# Patient Record
Sex: Female | Born: 1990
Health system: Southern US, Community
[De-identification: ages and names within clinical notes are randomized; demographics above are authoritative.]

## PROBLEM LIST (undated history)

## (undated) ENCOUNTER — Inpatient Hospital Stay (HOSPITAL_COMMUNITY): Payer: Self-pay

## (undated) DIAGNOSIS — B009 Herpesviral infection, unspecified: Secondary | ICD-10-CM

## (undated) DIAGNOSIS — F329 Major depressive disorder, single episode, unspecified: Secondary | ICD-10-CM

## (undated) DIAGNOSIS — F419 Anxiety disorder, unspecified: Secondary | ICD-10-CM

## (undated) DIAGNOSIS — N739 Female pelvic inflammatory disease, unspecified: Secondary | ICD-10-CM

## (undated) DIAGNOSIS — F32A Depression, unspecified: Secondary | ICD-10-CM

## (undated) DIAGNOSIS — N611 Abscess of the breast and nipple: Secondary | ICD-10-CM

## (undated) DIAGNOSIS — K219 Gastro-esophageal reflux disease without esophagitis: Secondary | ICD-10-CM

## (undated) DIAGNOSIS — J45909 Unspecified asthma, uncomplicated: Secondary | ICD-10-CM

## (undated) DIAGNOSIS — T7840XA Allergy, unspecified, initial encounter: Secondary | ICD-10-CM

## (undated) DIAGNOSIS — F319 Bipolar disorder, unspecified: Secondary | ICD-10-CM

## (undated) DIAGNOSIS — N390 Urinary tract infection, site not specified: Secondary | ICD-10-CM

## (undated) HISTORY — DX: Allergy, unspecified, initial encounter: T78.40XA

## (undated) HISTORY — DX: Abscess of the breast and nipple: N61.1

## (undated) HISTORY — PX: WISDOM TOOTH EXTRACTION: SHX21

## (undated) HISTORY — DX: Unspecified asthma, uncomplicated: J45.909

## (undated) HISTORY — DX: Bipolar disorder, unspecified: F31.9

---

## 2013-08-04 ENCOUNTER — Ambulatory Visit: Payer: Self-pay | Admitting: Family Medicine

## 2013-08-04 VITALS — BP 130/76 | HR 73 | Temp 98.6°F | Resp 16 | Ht 63.5 in | Wt 200.0 lb

## 2013-08-04 DIAGNOSIS — N898 Other specified noninflammatory disorders of vagina: Secondary | ICD-10-CM

## 2013-08-04 LAB — POCT WET PREP WITH KOH
Clue Cells Wet Prep HPF POC: NEGATIVE
Trichomonas, UA: NEGATIVE

## 2013-08-04 MED ORDER — FLUCONAZOLE 150 MG PO TABS: 150.0000 mg | ORAL_TABLET | Freq: Once | ORAL | Status: DC

## 2013-08-04 NOTE — Patient Instructions (Addendum)
I will be in touch with the rest of your labs  Try taking a diflucan pill once a week for 4 weeks.  Also, eating yogurt daily can help.    Let me know if you continue to have symptoms.

## 2013-08-04 NOTE — Progress Notes (Addendum)
[facility] [address], [place] [address] [phone]  Date:  08/04/2013   Name:  Kim Johns   DOB:  1991-04-11   MRN:  [id number]  PCP:  No primary provider on file.    Chief Complaint: Vaginal Discharge   History of Present Illness:  Kim Johns is a 22 y.o. very pleasant female patient who presents with the following:  She is concerned about a persistent vaginal yeast infection.   She was recently treated for yeast and BV- she was treated about 4-6 weeks ago.  She did get better for a time.  She has history of frequent yeast infections.  She noted vaginal itching, thick white discharge.  This has been present off an on for several weeks.   She has not been checked for DM as far as she knows. Her mother does have DM.   LMP was about 2 weeks ago.    She was recently incarcerated and reports she was tested for HIV several times and was negative  There are no active problems to display for this patient.   Past Medical History  Diagnosis Date  . Allergy     History reviewed. No pertinent past surgical history.  History  Substance Use Topics  . Smoking status: Never Smoker   . Smokeless tobacco: Not on file  . Alcohol Use: Not on file    Family History  Problem Relation Age of Onset  . Diabetes Mother   . Hypertension Father     Allergies  Allergen Reactions  . Sulfa Antibiotics     Had since childhood not sure of reaction    Medication list has been reviewed and updated.  No current outpatient prescriptions on file prior to visit.   No current facility-administered medications on file prior to visit.    Review of Systems:  As per HPI- otherwise negative.   Physical Examination: Filed Vitals:   08/04/13 1322  BP: 130/76  Pulse: 73  Temp: 98.6 F (37 C)  Resp: 16   Filed Vitals:   08/04/13 1322  Height: 5' 3.5" (1.613 m)  Weight: 200 lb (90.719 kg)   Body mass index is 34.87 kg/(m^2). Ideal Body Weight:  Weight in (lb) to have BMI = 25: 143.1  GEN: WDWN, NAD, Non-toxic, A & O x 3, overweight, looks well HEENT: Atraumatic, Normocephalic. Neck supple. No masses, No LAD. Ears and Nose: No external deformity. CV: RRR, No M/G/R. No JVD. No thrill. No extra heart sounds. PULM: CTA B, no wheezes, crackles, rhonchi. No retractions. No resp. distress. No accessory muscle use. ABD: S, NT, ND, +BS. No rebound. No HSM. EXTR: No c/c/e NEURO Normal gait.  PSYCH: Normally interactive. Conversant. Not depressed or anxious appearing.  Calm demeanor.  Gu: normal exam, no lesion, no adnexal tenderness or masses.  No CMT.  She does have some thick white discharge  Results for orders placed in visit on 08/04/13  POCT WET PREP WITH KOH      Result Value Range   Trichomonas, UA Negative     Clue Cells Wet Prep HPF POC neg     Epithelial Wet Prep HPF POC 0-3     Yeast Wet Prep HPF POC neg     Bacteria Wet Prep HPF POC trace.     RBC Wet Prep HPF POC neg     WBC Wet Prep HPF POC 0-3     KOH Prep POC Negative    POCT GLYCOSYLATED HEMOGLOBIN (  HGB A1C)      Result Value Range   Hemoglobin A1C 4.9       Assessment and Plan: Discharge from the vagina - Plan: POCT Wet Prep with KOH, POCT glycosylated hemoglobin (Hb A1C), GC/Chlamydia Probe Amp, fluconazole (DIFLUCAN) 150 MG tablet, CANCELED: POCT UA - Microscopic Only, CANCELED: POCT urinalysis dipstick  Possible persistent yeast vaginitis.  Will try treating with diflucan once a week for 4 weeks.  She will let me know if not better.  Will plan further follow- up pending labs.   Signed Abbe Amsterdam, MD  9/11:  Called her to give lab results, GC/ CMZ negative.  She will let me know if not feeling better

## 2013-08-05 LAB — GC/CHLAMYDIA PROBE AMP
CT Probe RNA: NEGATIVE
GC Probe RNA: NEGATIVE

## 2013-09-07 ENCOUNTER — Encounter (HOSPITAL_COMMUNITY): Payer: Self-pay | Admitting: Emergency Medicine

## 2013-09-07 ENCOUNTER — Emergency Department (HOSPITAL_COMMUNITY)
Admission: EM | Admit: 2013-09-07 | Discharge: 2013-09-07 | Disposition: A | Discharge status: Home / Self Care | Payer: Medicaid Other | Attending: Emergency Medicine | Admitting: Emergency Medicine

## 2013-09-07 DIAGNOSIS — Z3202 Encounter for pregnancy test, result negative: Secondary | ICD-10-CM | POA: Insufficient documentation

## 2013-09-07 DIAGNOSIS — N938 Other specified abnormal uterine and vaginal bleeding: Secondary | ICD-10-CM | POA: Insufficient documentation

## 2013-09-07 DIAGNOSIS — R3915 Urgency of urination: Secondary | ICD-10-CM | POA: Insufficient documentation

## 2013-09-07 DIAGNOSIS — F17200 Nicotine dependence, unspecified, uncomplicated: Secondary | ICD-10-CM | POA: Insufficient documentation

## 2013-09-07 DIAGNOSIS — R300 Dysuria: Secondary | ICD-10-CM | POA: Insufficient documentation

## 2013-09-07 DIAGNOSIS — J309 Allergic rhinitis, unspecified: Secondary | ICD-10-CM | POA: Insufficient documentation

## 2013-09-07 DIAGNOSIS — N949 Unspecified condition associated with female genital organs and menstrual cycle: Secondary | ICD-10-CM | POA: Insufficient documentation

## 2013-09-07 DIAGNOSIS — B373 Candidiasis of vulva and vagina: Secondary | ICD-10-CM

## 2013-09-07 DIAGNOSIS — B3731 Acute candidiasis of vulva and vagina: Secondary | ICD-10-CM | POA: Insufficient documentation

## 2013-09-07 DIAGNOSIS — Z882 Allergy status to sulfonamides status: Secondary | ICD-10-CM | POA: Insufficient documentation

## 2013-09-07 LAB — URINE MICROSCOPIC-ADD ON

## 2013-09-07 LAB — PREGNANCY, URINE: Preg Test, Ur: NEGATIVE

## 2013-09-07 LAB — WET PREP, GENITAL
Clue Cells Wet Prep HPF POC: NONE SEEN
Trich, Wet Prep: NONE SEEN

## 2013-09-07 LAB — URINALYSIS, ROUTINE W REFLEX MICROSCOPIC
Nitrite: NEGATIVE
Protein, ur: NEGATIVE mg/dL
Specific Gravity, Urine: 1.009 (ref 1.005–1.030)
Urobilinogen, UA: 0.2 mg/dL (ref 0.0–1.0)

## 2013-09-07 MED ORDER — FLUCONAZOLE 150 MG PO TABS: 150.0000 mg | ORAL_TABLET | Freq: Once | ORAL | Status: DC

## 2013-09-07 MED ORDER — FLUCONAZOLE 100 MG PO TABS
150.0000 mg | ORAL_TABLET | Freq: Every day | ORAL | Status: DC
  Administered 2013-09-07: 150 mg via ORAL
  Filled 2013-09-07: qty 2

## 2013-09-07 NOTE — ED Notes (Signed)
Pt reports that she feels like since yesterday she has the sensation to urination but can not. Reports that she has also noticed some vaginal discharge over the past couple of days. Reports mild abd pain. Skin warm and dry. No distress noted.

## 2013-09-07 NOTE — ED Provider Notes (Signed)
CSN: [id number]     Arrival date & time 09/07/13  6962 History   First MD Initiated Contact with Patient 09/07/13 [phone]     Chief Complaint  Patient presents with  . Urinary Frequency  . Vaginal Discharge   (Consider location/radiation/quality/duration/timing/severity/associated sxs/prior Treatment) HPI Comments: 22 year old female presents to the emergency department complaining of vaginal discharge x1 week. States she feels as if she has a yeast infection and is irritated in that area. Admits to associated increased urinary frequency and urgency beginning yesterday but is having minimal urine output. Despite triage summary, patient denies abdominal pain. Last menstrual period ended one week ago, 3 days ago she had some light spotting. States she has not had intercourse in over a month, has been checked for gonorrhea and Chlamydia since then and was negative. Denies fever, chills, nausea, vomiting, diarrhea, pelvic pain.  Patient is a 22 y.o. female presenting with frequency and vaginal discharge. The history is provided by the patient.  Urinary Frequency Pertinent negatives include no abdominal pain, nausea or vomiting.  Vaginal Discharge Associated symptoms: dysuria   Associated symptoms: no abdominal pain, no nausea and no vomiting     Past Medical History  Diagnosis Date  . Allergy    History reviewed. No pertinent past surgical history. Family History  Problem Relation Age of Onset  . Diabetes Mother   . Hypertension Father    History  Substance Use Topics  . Smoking status: Current Some Day Smoker  . Smokeless tobacco: Not on file  . Alcohol Use: Yes   OB History   Grav Para Term Preterm Abortions TAB SAB Ect Mult Living                 Review of Systems  Gastrointestinal: Negative for nausea, vomiting and abdominal pain.  Genitourinary: Positive for dysuria, urgency, frequency, vaginal bleeding and vaginal discharge.  Musculoskeletal: Negative for back pain.  All  other systems reviewed and are negative.    Allergies  Sulfa antibiotics  Home Medications  No current outpatient prescriptions on file. BP 125/68  Pulse 70  Temp(Src) 98.5 F (36.9 C) (Oral)  Resp 20  Ht 5\' 3"  (1.6 m)  Wt 200 lb (90.719 kg)  BMI 35.44 kg/m2  SpO2 98% Physical Exam  Nursing note and vitals reviewed. Constitutional: She is oriented to person, place, and time. She appears well-developed and well-nourished. No distress.  HENT:  Head: Normocephalic and atraumatic.  Mouth/Throat: Oropharynx is clear and moist.  Eyes: Conjunctivae are normal.  Neck: Normal range of motion. Neck supple.  Cardiovascular: Normal rate, regular rhythm and normal heart sounds.   Pulmonary/Chest: Effort normal and breath sounds normal.  Abdominal: Soft. Bowel sounds are normal. She exhibits no distension and no mass. There is no tenderness. There is no rebound and no guarding.  Palpation of the abdomen and causes sensation that she has to urinate.  Genitourinary: Uterus normal. There is no rash, tenderness or lesion on the right labia. There is no rash, tenderness or lesion on the left labia. Cervix exhibits no motion tenderness, no discharge and no friability. Right adnexum displays no mass, no tenderness and no fullness. Left adnexum displays no mass, no tenderness and no fullness. No erythema, tenderness or bleeding around the vagina. Vaginal discharge (scant, clumpy, white) found.  Musculoskeletal: Normal range of motion. She exhibits no edema.  Neurological: She is alert and oriented to person, place, and time.  Skin: Skin is warm and dry. She is not diaphoretic.  Psychiatric:  She has a normal mood and affect. Her behavior is normal.    ED Course  Procedures (including critical care time) Labs Review Labs Reviewed  WET PREP, GENITAL - Abnormal; Notable for the following:    Yeast Wet Prep HPF POC MODERATE (*)    WBC, Wet Prep HPF POC MODERATE (*)    All other components within  normal limits  URINALYSIS, ROUTINE W REFLEX MICROSCOPIC - Abnormal; Notable for the following:    APPearance CLOUDY (*)    Leukocytes, UA MODERATE (*)    All other components within normal limits  URINE MICROSCOPIC-ADD ON - Abnormal; Notable for the following:    Bacteria, UA FEW (*)    All other components within normal limits  GC/CHLAMYDIA PROBE AMP  PREGNANCY, URINE   Imaging Review No results found.  EKG Interpretation   None       MDM   1. Vaginal yeast infection    Patient of vaginal discharge and irritation. No abdominal pain. No cervical motion tenderness, adnexal tenderness. Diflucan tab given in the emergency department, prescription for a second Diflucan to be taken in 48 hours given. Return precautions discussed. Patient states understanding of plan and is agreeable.  Trevor Mace, PA-C 09/08/13 [phone]

## 2013-09-08 NOTE — ED Provider Notes (Signed)
  Medical screening examination/treatment/procedure(s) were performed by non-physician practitioner and as supervising physician I was immediately available for consultation/collaboration.    Gerhard Munch, MD 09/08/13 [phone]

## 2013-09-09 ENCOUNTER — Telehealth (HOSPITAL_COMMUNITY): Payer: Self-pay | Admitting: Emergency Medicine

## 2013-09-09 NOTE — ED Notes (Signed)
Pt calling for STD results.  ID verified x 2.  Informed both gonorrhea and chlamydia were negative.

## 2013-09-15 ENCOUNTER — Ambulatory Visit: Payer: Medicaid Other | Attending: Internal Medicine

## 2013-09-17 ENCOUNTER — Emergency Department (INDEPENDENT_AMBULATORY_CARE_PROVIDER_SITE_OTHER)
Admission: EM | Admit: 2013-09-17 | Discharge: 2013-09-17 | Disposition: A | Discharge status: Home / Self Care | Payer: Medicaid Other | Source: Home / Self Care | Attending: Family Medicine | Admitting: Family Medicine

## 2013-09-17 ENCOUNTER — Other Ambulatory Visit (HOSPITAL_COMMUNITY)
Admission: RE | Admit: 2013-09-17 | Discharge: 2013-09-17 | Disposition: A | Discharge status: Home / Self Care | Payer: Medicaid Other | Source: Ambulatory Visit | Attending: Family Medicine | Admitting: Family Medicine

## 2013-09-17 ENCOUNTER — Encounter (HOSPITAL_COMMUNITY): Payer: Self-pay | Admitting: Emergency Medicine

## 2013-09-17 DIAGNOSIS — Z113 Encounter for screening for infections with a predominantly sexual mode of transmission: Secondary | ICD-10-CM | POA: Insufficient documentation

## 2013-09-17 DIAGNOSIS — N760 Acute vaginitis: Secondary | ICD-10-CM

## 2013-09-17 DIAGNOSIS — A499 Bacterial infection, unspecified: Secondary | ICD-10-CM

## 2013-09-17 DIAGNOSIS — B9689 Other specified bacterial agents as the cause of diseases classified elsewhere: Secondary | ICD-10-CM

## 2013-09-17 LAB — POCT URINALYSIS DIP (DEVICE)
Glucose, UA: NEGATIVE mg/dL
Hgb urine dipstick: NEGATIVE
Ketones, ur: NEGATIVE mg/dL
Leukocytes, UA: NEGATIVE
Protein, ur: 30 mg/dL — AB
Specific Gravity, Urine: 1.02 (ref 1.005–1.030)
Urobilinogen, UA: 1 mg/dL (ref 0.0–1.0)
pH: 8 (ref 5.0–8.0)

## 2013-09-17 LAB — POCT PREGNANCY, URINE: Preg Test, Ur: NEGATIVE

## 2013-09-17 MED ORDER — TERCONAZOLE 80 MG VA SUPP: 80.0000 mg | Freq: Every day | VAGINAL | Status: DC

## 2013-09-17 MED ORDER — METRONIDAZOLE 500 MG PO TABS: 500.0000 mg | ORAL_TABLET | Freq: Two times a day (BID) | ORAL | Status: DC

## 2013-09-17 MED ORDER — FLUCONAZOLE 150 MG PO TABS: 150.0000 mg | ORAL_TABLET | Freq: Once | ORAL | Status: DC

## 2013-09-17 NOTE — ED Notes (Signed)
Pt  Reports    Vaginal  Discharge     And  Foul  Smell          -  She  Was seen         9  Days  Ago  Er  Had  Exam         And  Tests           -  Symptoms  Persist     -  She  Ambulated  To  Room  With a  Steady  Fluid  gait

## 2013-09-17 NOTE — ED Provider Notes (Signed)
CSN: [id number]     Arrival date & time 09/17/13  4098 History   First MD Initiated Contact with Patient 09/17/13 1009     Chief Complaint  Patient presents with  . Vaginal Discharge   (Consider location/radiation/quality/duration/timing/severity/associated sxs/prior Treatment) Patient is a 22 y.o. female presenting with vaginal discharge. The history is provided by the patient.  Vaginal Discharge Quality:  Malodorous, yellow and watery Onset quality:  Gradual Duration:  1 week (seen 10 / 14 in ER and treated for yeast, . pt still with sx of bv.) Progression:  Unchanged Chronicity:  Recurrent Associated symptoms: no abdominal pain, no dysuria, no fever and no vaginal itching     Past Medical History  Diagnosis Date  . Allergy    History reviewed. No pertinent past surgical history. Family History  Problem Relation Age of Onset  . Diabetes Mother   . Hypertension Father    History  Substance Use Topics  . Smoking status: Current Some Day Smoker  . Smokeless tobacco: Not on file  . Alcohol Use: Yes   OB History   Grav Para Term Preterm Abortions TAB SAB Ect Mult Living                 Review of Systems  Constitutional: Negative.  Negative for fever.  Gastrointestinal: Negative for abdominal pain.  Genitourinary: Positive for vaginal discharge. Negative for dysuria, urgency, vaginal bleeding, menstrual problem and pelvic pain.    Allergies  Sulfa antibiotics  Home Medications   Current Outpatient Rx  Name  Route  Sig  Dispense  Refill  . fluconazole (DIFLUCAN) 150 MG tablet   Oral   Take 1 tablet (150 mg total) by mouth once.   1 tablet   0   . fluconazole (DIFLUCAN) 150 MG tablet   Oral   Take 1 tablet (150 mg total) by mouth once. May repeat in 1 week.   1 tablet   1   . metroNIDAZOLE (FLAGYL) 500 MG tablet   Oral   Take 1 tablet (500 mg total) by mouth 2 (two) times daily.   14 tablet   0   . terconazole (TERAZOL 3) 80 MG vaginal suppository  Vaginal   Place 1 suppository (80 mg total) vaginally at bedtime.   3 suppository   0    BP 122/79  Pulse 63  Temp(Src) 97.2 F (36.2 C) (Oral)  Resp 18  SpO2 97%  LMP 09/16/2013 Physical Exam  Nursing note and vitals reviewed. Constitutional: She is oriented to person, place, and time. She appears well-developed and well-nourished.  Abdominal: Soft. Bowel sounds are normal.  Genitourinary: Uterus normal. Cervix exhibits no motion tenderness and no discharge. Right adnexum displays no mass, no tenderness and no fullness. Left adnexum displays no mass, no tenderness and no fullness. No erythema, tenderness or bleeding around the vagina. Vaginal discharge found.  Neurological: She is alert and oriented to person, place, and time.  Skin: Skin is warm and dry.    ED Course  Procedures (including critical care time) Labs Review Labs Reviewed  POCT URINALYSIS DIP (DEVICE) - Abnormal; Notable for the following:    Protein, ur 30 (*)    All other components within normal limits  POCT PREGNANCY, URINE  CERVICOVAGINAL ANCILLARY ONLY   Imaging Review No results found.    MDM      Linna Hoff, MD 09/17/13 [phone]

## 2013-09-22 NOTE — ED Notes (Signed)
Reports positive for yeast , gardenerella  ; treated w

## 2013-09-22 NOTE — ED Notes (Addendum)
Fluticasone, diflucan, metronidazole Rx dispensed. Adequate treatment for yeast, gardnerella. Spoke w patient, and after verifying ID, discussed report results, and advised to complete treatment as Rx

## 2013-09-27 NOTE — ED Notes (Signed)
Correction previous lab note.  Candida neg. and Gardnerella pos. Vassie Moselle 09/27/2013

## 2013-10-01 ENCOUNTER — Emergency Department (INDEPENDENT_AMBULATORY_CARE_PROVIDER_SITE_OTHER)
Admission: EM | Admit: 2013-10-01 | Discharge: 2013-10-01 | Disposition: A | Discharge status: Home / Self Care | Payer: Medicaid Other | Source: Home / Self Care | Attending: Family Medicine | Admitting: Family Medicine

## 2013-10-01 ENCOUNTER — Encounter (HOSPITAL_COMMUNITY): Payer: Self-pay | Admitting: Emergency Medicine

## 2013-10-01 DIAGNOSIS — N760 Acute vaginitis: Secondary | ICD-10-CM

## 2013-10-01 LAB — POCT URINALYSIS DIP (DEVICE)
Glucose, UA: NEGATIVE mg/dL
Hgb urine dipstick: NEGATIVE
Ketones, ur: NEGATIVE mg/dL
Nitrite: NEGATIVE
Specific Gravity, Urine: 1.02 (ref 1.005–1.030)
Urobilinogen, UA: 0.2 mg/dL (ref 0.0–1.0)
pH: 7.5 (ref 5.0–8.0)

## 2013-10-01 LAB — POCT PREGNANCY, URINE: Preg Test, Ur: NEGATIVE

## 2013-10-01 MED ORDER — FLUCONAZOLE 150 MG PO TABS: 150.0000 mg | ORAL_TABLET | ORAL | Status: DC

## 2013-10-01 MED ORDER — METRONIDAZOLE 500 MG PO TABS: 500.0000 mg | ORAL_TABLET | Freq: Two times a day (BID) | ORAL | Status: DC

## 2013-10-01 NOTE — ED Provider Notes (Signed)
Kim Johns is a 22 y.o. female who presents to Urgent Care today for vaginal itching. Patient has a history of recurrent vaginal yeast infections and BV. She was seen on October 14, and 24th. On the 14th she had a yeast infection and on the 24th she had nitroglycerin vaginosis. She has been treated for both. She has recently completed fluconazole and metronidazole course. She had a menstrual period in notes over the last 2 days some itching and discharge. She says that this is consistent with prior episodes of yeast. She would like to avoid a vaginal exam today if possible.   She has a followup appointment next month with a primary care provider at the [facility].   Past Medical History  Diagnosis Date  . Allergy    History  Substance Use Topics  . Smoking status: Current Some Day Smoker  . Smokeless tobacco: Not on file  . Alcohol Use: Yes   ROS as above Medications reviewed. No current facility-administered medications for this encounter.   Current Outpatient Prescriptions  Medication Sig Dispense Refill  . fluconazole (DIFLUCAN) 150 MG tablet Take 1 tablet (150 mg total) by mouth once a week.  3 tablet  1  . metroNIDAZOLE (FLAGYL) 500 MG tablet Take 1 tablet (500 mg total) by mouth 2 (two) times daily.  14 tablet  0    Exam:  BP 120/84  Pulse 74  Temp(Src) 98.2 F (36.8 C) (Oral)  Resp 18  SpO2 99%  LMP 09/16/2013 Gen: Well NAD HEENT: EOMI,  MMM Lungs: CTABL Nl WOB Heart: RRR no MRG Abd: NABS, NT, ND Exts: Non edematous BL  LE, warm and well perfused.   Results for orders placed during the hospital encounter of 10/01/13 (from the past 24 hour(s))  POCT URINALYSIS DIP (DEVICE)     Status: None   Collection Time    10/01/13  9:16 AM      Result Value Range   Glucose, UA NEGATIVE  NEGATIVE mg/dL   Bilirubin Urine NEGATIVE  NEGATIVE   Ketones, ur NEGATIVE  NEGATIVE mg/dL   Specific Gravity, Urine 1.020  1.005 - 1.030   Hgb urine dipstick  NEGATIVE  NEGATIVE   pH 7.5  5.0 - 8.0   Protein, ur NEGATIVE  NEGATIVE mg/dL   Urobilinogen, UA 0.2  0.0 - 1.0 mg/dL   Nitrite NEGATIVE  NEGATIVE   Leukocytes, UA NEGATIVE  NEGATIVE  POCT PREGNANCY, URINE     Status: None   Collection Time    10/01/13  9:19 AM      Result Value Range   Preg Test, Ur NEGATIVE  NEGATIVE   No results found.  Assessment and Plan: 22 y.o. female with probable yeast infection. We'll treat with fluconazole for a three-week course, and metronidazole. Patient declined a vaginal exam today. I feel this is reasonable. Patient will followup with her primary care provider. Discussed warning signs or symptoms. Please see discharge instructions. Patient expresses understanding.      Rodolph Bong, MD 10/01/13 [phone]

## 2013-10-01 NOTE — ED Notes (Signed)
Pt c/o persistent white vag d/c... Seen and treated for yest and BV on 09/17/13 Sxs also include itchiness Denies: abd/back pain, urinary sxs, f/v/n/d Alert w/no signs of acute distress.

## 2013-10-04 NOTE — ED Notes (Signed)
Patient called to ask lab related questions

## 2013-10-19 ENCOUNTER — Ambulatory Visit: Payer: Medicaid Other | Attending: Internal Medicine | Admitting: Internal Medicine

## 2013-10-19 ENCOUNTER — Encounter: Payer: Self-pay | Admitting: Internal Medicine

## 2013-10-19 VITALS — BP 126/85 | HR 71 | Temp 98.7°F | Resp 17 | Ht 63.0 in | Wt 197.6 lb

## 2013-10-19 DIAGNOSIS — R0981 Nasal congestion: Secondary | ICD-10-CM

## 2013-10-19 DIAGNOSIS — N939 Abnormal uterine and vaginal bleeding, unspecified: Secondary | ICD-10-CM | POA: Insufficient documentation

## 2013-10-19 DIAGNOSIS — N898 Other specified noninflammatory disorders of vagina: Secondary | ICD-10-CM

## 2013-10-19 DIAGNOSIS — H6690 Otitis media, unspecified, unspecified ear: Secondary | ICD-10-CM

## 2013-10-19 DIAGNOSIS — R252 Cramp and spasm: Secondary | ICD-10-CM

## 2013-10-19 DIAGNOSIS — N9489 Other specified conditions associated with female genital organs and menstrual cycle: Secondary | ICD-10-CM | POA: Insufficient documentation

## 2013-10-19 DIAGNOSIS — J3489 Other specified disorders of nose and nasal sinuses: Secondary | ICD-10-CM

## 2013-10-19 DIAGNOSIS — Z139 Encounter for screening, unspecified: Secondary | ICD-10-CM

## 2013-10-19 DIAGNOSIS — N920 Excessive and frequent menstruation with regular cycle: Secondary | ICD-10-CM | POA: Insufficient documentation

## 2013-10-19 DIAGNOSIS — H6691 Otitis media, unspecified, right ear: Secondary | ICD-10-CM

## 2013-10-19 LAB — CBC WITH DIFFERENTIAL/PLATELET
Eosinophils Absolute: 0.1 10*3/uL (ref 0.0–0.7)
Eosinophils Relative: 2 % (ref 0–5)
Lymphocytes Relative: 20 % (ref 12–46)
Lymphs Abs: 1.3 10*3/uL (ref 0.7–4.0)
MCH: 32.6 pg (ref 26.0–34.0)
Neutro Abs: 4.8 10*3/uL (ref 1.7–7.7)
Neutrophils Relative %: 70 % (ref 43–77)
Platelets: 317 10*3/uL (ref 150–400)
RBC: 4.11 MIL/uL (ref 3.87–5.11)
WBC: 6.7 10*3/uL (ref 4.0–10.5)

## 2013-10-19 LAB — LIPID PANEL
Cholesterol: 149 mg/dL (ref 0–200)
LDL Cholesterol: 83 mg/dL (ref 0–99)
Total CHOL/HDL Ratio: 2.9 Ratio
Triglycerides: 69 mg/dL (ref <150)
VLDL: 14 mg/dL (ref 0–40)

## 2013-10-19 LAB — POCT URINALYSIS DIPSTICK
Bilirubin, UA: NEGATIVE
Glucose, UA: NEGATIVE
Ketones, UA: NEGATIVE
Leukocytes, UA: NEGATIVE
Nitrite, UA: NEGATIVE
Urobilinogen, UA: 1
pH, UA: 7.5

## 2013-10-19 LAB — COMPLETE METABOLIC PANEL WITH GFR
ALT: 8 U/L (ref 0–35)
Albumin: 4 g/dL (ref 3.5–5.2)
CO2: 24 mEq/L (ref 19–32)
Calcium: 9.4 mg/dL (ref 8.4–10.5)
Chloride: 108 mEq/L (ref 96–112)
GFR, Est African American: >89 mL/min
Potassium: 4 mEq/L (ref 3.5–5.3)
Sodium: 140 mEq/L (ref 135–145)
Total Bilirubin: 0.3 mg/dL (ref 0.3–1.2)
Total Protein: 7 g/dL (ref 6.0–8.3)

## 2013-10-19 LAB — POCT URINE PREGNANCY: Preg Test, Ur: NEGATIVE

## 2013-10-19 LAB — TSH: TSH: 0.201 u[IU]/mL — ABNORMAL LOW (ref 0.350–4.500)

## 2013-10-19 MED ORDER — AMOXICILLIN-POT CLAVULANATE 875-125 MG PO TABS: 1.0000 | ORAL_TABLET | Freq: Two times a day (BID) | ORAL | Status: DC

## 2013-10-19 MED ORDER — FLUTICASONE PROPIONATE 50 MCG/ACT NA SUSP: 2.0000 | Freq: Every day | NASAL | Status: DC

## 2013-10-19 NOTE — Progress Notes (Signed)
Patient is prone to bacterial and yeast infections Has been at the ED and urgent care recently Does not seem to be getting better Has cramps in between periods  And some spotting Complains of having a cough runny nose for about a week or so and  Her right ear is clogged

## 2013-10-19 NOTE — Progress Notes (Signed)
Patient ID: Kim Johns, female   DOB: [birth year], 22 y.o.   MRN: [id number] MRN: [id number] Name: Kim Johns  Sex: female Age: 22 y.o. DOB: 08/24/1991  Allergies: Sulfa antibiotics  Chief Complaint  Patient presents with   new patient    HPI: Patient is 22 y.o. female who comes for the first time to establish medical care, she has history of what discharge and was previously treated with Diflucan and Flagyl, currently she denies any discharge but reported to have a lot of cramps as well as vaginal spotting him a she also mentioned that her menstrual flow is high, she is never seen a gynecologist, patient also reports a lot of nasal congestion postnasal drip right ear pain for the last few days denies any fever chills.  Past Medical History  Diagnosis Date   Allergy     History reviewed. No pertinent past surgical history.    Medication List       This list is accurate as of: 10/19/13 11:04 AM.  Always use your most recent med list.               amoxicillin-clavulanate 875-125 MG per tablet  Commonly known as:  AUGMENTIN  Take 1 tablet by mouth 2 (two) times daily.     fluconazole 150 MG tablet  Commonly known as:  DIFLUCAN  Take 1 tablet (150 mg total) by mouth once a week.     fluticasone 50 MCG/ACT nasal spray  Commonly known as:  FLONASE  Place 2 sprays into both nostrils daily.     metroNIDAZOLE 500 MG tablet  Commonly known as:  FLAGYL  Take 1 tablet (500 mg total) by mouth 2 (two) times daily.        Meds ordered this encounter  Medications   amoxicillin-clavulanate (AUGMENTIN) 875-125 MG per tablet    Sig: Take 1 tablet by mouth 2 (two) times daily.    Dispense:  20 tablet    Refill:  0   fluticasone (FLONASE) 50 MCG/ACT nasal spray    Sig: Place 2 sprays into both nostrils daily.    Dispense:  16 g    Refill:  6     There is no immunization history on file for this patient.  History  Substance Use Topics   Smoking status: Current  Some Day Smoker -- 0.50 packs/day for 10 years   Smokeless tobacco: Not on file   Alcohol Use: Yes    Review of Systems  As noted in HPI  Filed Vitals:   10/19/13 1030  BP: 126/85  Pulse: 71  Temp: 98.7 F (37.1 C)  Resp: 17    Physical Exam  Physical Exam  Constitutional: She is oriented to person, place, and time. No distress.  HENT:  Nasal congestion ,  Right TM congested no discharge,    No sinus tenderness  Eyes: EOM are normal. Pupils are equal, round, and reactive to light.  Cardiovascular: Normal rate and regular rhythm.   Pulmonary/Chest: Breath sounds normal. No respiratory distress. She has no wheezes. She has no rales.  Neurological: She is alert and oriented to person, place, and time.    CBC No results found for this basename: wbc, rbc, hgb, hct, plt, mcv, neutrabs, lymphsabs, monoabs, eosabs, basosabs    CMP  No results found for this basename: na, k, cl, co2, glucose, bun, creatinine, calcium, prot, albumin, ast, alt, alkphos, bilitot, gfrnonaa, gfraa    No results found for this basename: chol, tri, ldl  No components found with this basename: hga1c    No results found for this basename: AST    Assessment and Plan  Vaginal spotting - Plan: Ambulatory referral to Gynecology  Cramps, - Plan: Urinalysis Dipstick negative for UTI, POCT urine pregnancy negative,  Menorrhagia  Ambulatory referral to Gynecology   Otitis, right - Plan: amoxicillin-clavulanate (AUGMENTIN) 875-125 MG per tablet  Stuffy nose - Plan: fluticasone (FLONASE) 50 MCG/ACT nasal spray  Screening - Plan: CBC with Differential, COMPLETE METABOLIC PANEL WITH GFR, TSH, Vit D  25 hydroxy (rtn osteoporosis monitoring), Lipid panel   -Vaccinations:  Declined  -Influenza shot   Return in about 6 weeks (around 11/30/2013).  Doris Cheadle, MD

## 2013-10-26 ENCOUNTER — Ambulatory Visit: Payer: Medicaid Other | Admitting: Internal Medicine

## 2013-11-05 ENCOUNTER — Other Ambulatory Visit (HOSPITAL_COMMUNITY)
Admission: RE | Admit: 2013-11-05 | Discharge: 2013-11-05 | Disposition: A | Discharge status: Home / Self Care | Payer: Medicaid Other | Source: Ambulatory Visit | Attending: Family Medicine | Admitting: Family Medicine

## 2013-11-05 ENCOUNTER — Emergency Department (INDEPENDENT_AMBULATORY_CARE_PROVIDER_SITE_OTHER)
Admission: EM | Admit: 2013-11-05 | Discharge: 2013-11-05 | Disposition: A | Discharge status: Home / Self Care | Payer: Medicaid Other | Source: Home / Self Care | Attending: Family Medicine | Admitting: Family Medicine

## 2013-11-05 ENCOUNTER — Encounter (HOSPITAL_COMMUNITY): Payer: Self-pay | Admitting: Emergency Medicine

## 2013-11-05 DIAGNOSIS — B3731 Acute candidiasis of vulva and vagina: Secondary | ICD-10-CM

## 2013-11-05 DIAGNOSIS — Z113 Encounter for screening for infections with a predominantly sexual mode of transmission: Secondary | ICD-10-CM | POA: Insufficient documentation

## 2013-11-05 DIAGNOSIS — N898 Other specified noninflammatory disorders of vagina: Secondary | ICD-10-CM

## 2013-11-05 DIAGNOSIS — N760 Acute vaginitis: Secondary | ICD-10-CM | POA: Insufficient documentation

## 2013-11-05 DIAGNOSIS — N939 Abnormal uterine and vaginal bleeding, unspecified: Secondary | ICD-10-CM

## 2013-11-05 DIAGNOSIS — B373 Candidiasis of vulva and vagina: Secondary | ICD-10-CM

## 2013-11-05 LAB — POCT URINALYSIS DIP (DEVICE)
Bilirubin Urine: NEGATIVE
Glucose, UA: NEGATIVE mg/dL
Ketones, ur: NEGATIVE mg/dL
Leukocytes, UA: NEGATIVE
Nitrite: NEGATIVE
Protein, ur: NEGATIVE mg/dL
pH: 7.5 (ref 5.0–8.0)

## 2013-11-05 MED ORDER — FLUCONAZOLE 200 MG PO TABS: 200.0000 mg | ORAL_TABLET | Freq: Every day | ORAL | Status: AC

## 2013-11-05 NOTE — ED Provider Notes (Signed)
CSN: [id number]     Arrival date & time 11/05/13  0913 History   First MD Initiated Contact with Patient 11/05/13 1003     Chief Complaint  Patient presents with  . Vaginitis    Patient is a 22 y.o. female presenting with vaginal itching. The history is provided by the patient.  Vaginal Itching This is a recurrent problem. The current episode started more than 2 days ago. The problem occurs constantly. The problem has not changed since onset.Pertinent negatives include no abdominal pain. Nothing aggravates the symptoms. Nothing relieves the symptoms.  Pt reports she finished a regimen of  Amoxicillin approx 1 week ago for a URI. Several days ago she began to have vaginal itching and thick white vag d/c. She admits to h/o vag yeast infections especially with use of antibiotics. She had a Diflucan tablet and took one. It seemed to help some but symptoms persist. Pt admits she is sexually active with one partner. Has a h/o vaginal spotting in between periods and had a normal period 1 month ago. Admits to h/o Chlamydia as a teenager. Has an appointment for annual screening at [facility] in January. Denies UTI sx's.   Past Medical History  Diagnosis Date  . Allergy   . Asthma    History reviewed. No pertinent past surgical history. Family History  Problem Relation Age of Onset  . Diabetes Mother   . Stroke Mother   . Hypertension Father    History  Substance Use Topics  . Smoking status: Current Some Day Smoker -- 0.50 packs/day for 10 years  . Smokeless tobacco: Not on file  . Alcohol Use: Yes   OB History   Grav Para Term Preterm Abortions TAB SAB Ect Mult Living                 Review of Systems  Gastrointestinal: Negative for abdominal pain.  Genitourinary: Positive for vaginal discharge.  All other systems reviewed and are negative.    Allergies  Sulfa antibiotics  Home Medications   Current Outpatient Rx  Name  Route  Sig  Dispense  Refill  .  amoxicillin-clavulanate (AUGMENTIN) 875-125 MG per tablet   Oral   Take 1 tablet by mouth 2 (two) times daily.   20 tablet   0   . fluconazole (DIFLUCAN) 150 MG tablet   Oral   Take 1 tablet (150 mg total) by mouth once a week.   6 tablet   2   . fluticasone (FLONASE) 50 MCG/ACT nasal spray   Each Nare   Place 2 sprays into both nostrils daily.   16 g   6   . metroNIDAZOLE (FLAGYL) 500 MG tablet   Oral   Take 1 tablet (500 mg total) by mouth 2 (two) times daily.   14 tablet   0   . nicotine (EQL NICOTINE) 21 mg/24hr patch   Transdermal   Place 1 patch (21 mg total) onto the skin daily.   31 patch   3    BP 122/77  Pulse 76  Temp(Src) 98 F (36.7 C) (Oral)  Resp 16  SpO2 100% Physical Exam  Nursing note and vitals reviewed. Constitutional: She is oriented to person, place, and time. She appears well-developed and well-nourished.  HENT:  Head: Normocephalic and atraumatic.  Eyes: Conjunctivae are normal.  Cardiovascular: Normal rate.   Pulmonary/Chest: Effort normal.  Abdominal: Soft. Hernia confirmed negative in the right inguinal area and confirmed negative in the  left inguinal area.  Genitourinary: No labial fusion. There is no rash, tenderness, lesion or injury on the right labia. There is no rash, tenderness, lesion or injury on the left labia. Uterus is not deviated, not enlarged, not fixed and not tender. Cervix exhibits no motion tenderness, no discharge and no friability. Right adnexum displays no mass, no tenderness and no fullness. Left adnexum displays no mass, no tenderness and no fullness. There is erythema and bleeding around the vagina. No tenderness around the vagina. No foreign body around the vagina. No signs of injury around the vagina. Vaginal discharge found.  Scant old blood noted in cervical os, otherwise unremarkable. Small to mod amt Thick white "clumpy" vag d/c.  Mild vaginal mucosal erythema  Musculoskeletal: Normal range of motion.   Lymphadenopathy:       Right: No inguinal adenopathy present.       Left: No inguinal adenopathy present.  Neurological: She is alert and oriented to person, place, and time.  Skin: Skin is warm and dry.  Psychiatric: She has a normal mood and affect.    ED Course  Pelvic exam Date/Time: 11/05/2013 10:52 AM Performed by: Leanne Chang Authorized by: Clementeen Graham, S Consent: Verbal consent obtained. written consent not obtained. Risks and benefits: risks, benefits and alternatives were discussed Consent given by: patient Patient consent: the patient's understanding of the procedure matches consent given Required items: required blood products, implants, devices, and special equipment available Patient identity confirmed: verbally with patient and arm band Local anesthesia used: no Patient sedated: no Patient tolerance: Patient tolerated the procedure well with no immediate complications.   (including critical care time) Labs Review Labs Reviewed  POCT URINALYSIS DIP (DEVICE)  POCT PREGNANCY, URINE  CERVICOVAGINAL ANCILLARY ONLY  CERVICOVAGINAL ANCILLARY ONLY   Imaging Review No results found.    MDM   1. Vaginal candidiasis   2. Vaginal spotting    1 wk h/o symptoms c/w vaginal yeast infection after antibiotic use. Thick white "clumpy" vag d/c noted on exam, PE otherwise unremarkable.  U/A and Urine preg neg. Will treat today w/ Diflucan and encourage pt to consider OTC Lotrimin or Metronidazole vag creams as well. Will f/u cx's and encourage pt to keep scheduled appointment for annual screening. Pt agreeable w/ plan.    Leanne Chang, NP 11/05/13 1056  Leanne Chang, NP 11/05/13 1102  Medical screening examination/treatment/procedure(s) were performed by a resident physician or non-physician practitioner and as the supervising physician I was immediately available for consultation/collaboration.  Clementeen Graham, MD    Rodolph Bong, MD 12/30/13  [phone]

## 2013-11-05 NOTE — ED Notes (Signed)
States she has another yeast infection, related to her last amoxicillin Rx

## 2013-11-05 NOTE — ED Notes (Signed)
Call back number for lab issues verified at release  

## 2013-11-25 NOTE — L&D Delivery Note (Signed)
Delivery Note At 1559 a non-viable female was delivered via spontaneous vaginal delivery (Presentation: vertex ;  ).   Called to bedside when patient complained of increased pelvic pressure. A nonviable female was delivered from the vagina spontaneously into the bed. Bleeding was minimal after delivery. Fetal cord was clamped and cut and newborn was handed to mother in a blanket. After 15 minutes, placenta delivered from the vagina spontaneously and was found to be intact. Uterine massage was applied for 2 minutes and bleeding was stopped. No vaginal lacerations required repair. Family elected to have fetal biopsy. Mother was stable and prepped for transfer to postpartum. Newborn sent to morgue.   Anesthesia:  None Episiotomy: None  Lacerations: None Suture Repair: n/a Est. Blood Loss (mL): 100  Mom to postpartum.  Baby to [place].  Delivery attended by Allie Dimmer, Jewel Venditto A 06/17/2014, 4:29 PM

## 2013-11-30 ENCOUNTER — Encounter: Payer: Self-pay | Admitting: Internal Medicine

## 2013-11-30 ENCOUNTER — Ambulatory Visit: Payer: Medicaid Other | Attending: Internal Medicine | Admitting: Internal Medicine

## 2013-11-30 VITALS — BP 128/84 | HR 84 | Temp 98.8°F | Resp 16 | Ht 63.0 in | Wt 202.0 lb

## 2013-11-30 DIAGNOSIS — J45909 Unspecified asthma, uncomplicated: Secondary | ICD-10-CM | POA: Insufficient documentation

## 2013-11-30 DIAGNOSIS — F17200 Nicotine dependence, unspecified, uncomplicated: Secondary | ICD-10-CM | POA: Insufficient documentation

## 2013-11-30 DIAGNOSIS — J069 Acute upper respiratory infection, unspecified: Secondary | ICD-10-CM | POA: Insufficient documentation

## 2013-11-30 DIAGNOSIS — N920 Excessive and frequent menstruation with regular cycle: Secondary | ICD-10-CM

## 2013-11-30 MED ORDER — NICOTINE 21 MG/24HR TD PT24: 21.0000 mg | MEDICATED_PATCH | Freq: Every day | TRANSDERMAL | Status: DC

## 2013-11-30 NOTE — Progress Notes (Signed)
Pt here to f/u last visit URI Denies cough or congestion Need patch for smoking cessation Hx. Asthma

## 2013-12-03 ENCOUNTER — Encounter: Payer: Self-pay | Admitting: Nurse Practitioner

## 2013-12-03 ENCOUNTER — Ambulatory Visit (INDEPENDENT_AMBULATORY_CARE_PROVIDER_SITE_OTHER): Payer: No Typology Code available for payment source | Admitting: Nurse Practitioner

## 2013-12-03 VITALS — BP 132/81 | HR 66 | Temp 97.8°F | Ht 62.0 in | Wt 200.6 lb

## 2013-12-03 DIAGNOSIS — N760 Acute vaginitis: Secondary | ICD-10-CM

## 2013-12-03 NOTE — Patient Instructions (Signed)
Bacterial Vaginosis Bacterial vaginosis (BV) is a vaginal infection where the normal balance of bacteria in the vagina is disrupted. The normal balance is then replaced by an overgrowth of certain bacteria. There are several different kinds of bacteria that can cause BV. BV is the most common vaginal infection in women of childbearing age. CAUSES   The cause of BV is not fully understood. BV develops when there is an increase or imbalance of harmful bacteria.  Some activities or behaviors can upset the normal balance of bacteria in the vagina and put women at increased risk including:  Having a new sex partner or multiple sex partners.  Douching.  Using an intrauterine device (IUD) for contraception.  It is not clear what role sexual activity plays in the development of BV. However, women that have never had sexual intercourse are rarely infected with BV. Women do not get BV from toilet seats, bedding, swimming pools or from touching objects around them.  SYMPTOMS   Grey vaginal discharge.  A fish-like odor with discharge, especially after sexual intercourse.  Itching or burning of the vagina and vulva.  Burning or pain with urination.  Some women have no signs or symptoms at all. DIAGNOSIS  Your caregiver must examine the vagina for signs of BV. Your caregiver will perform lab tests and look at the sample of vaginal fluid through a microscope. They will look for bacteria and abnormal cells (clue cells), a pH test higher than 4.5, and a positive amine test all associated with BV.  RISKS AND COMPLICATIONS   Pelvic inflammatory disease (PID).  Infections following gynecology surgery.  Developing HIV.  Developing herpes virus. TREATMENT  Sometimes BV will clear up without treatment. However, all women with symptoms of BV should be treated to avoid complications, especially if gynecology surgery is planned. Female partners generally do not need to be treated. However, BV may spread  between female sex partners so treatment is helpful in preventing a recurrence of BV.   BV may be treated with antibiotics. The antibiotics come in either pill or vaginal cream forms. Either can be used with nonpregnant or pregnant women, but the recommended dosages differ. These antibiotics are not harmful to the baby.  BV can recur after treatment. If this happens, a second round of antibiotics will often be prescribed.  Treatment is important for pregnant women. If not treated, BV can cause a premature delivery, especially for a pregnant woman who had a premature birth in the past. All pregnant women who have symptoms of BV should be checked and treated.  For chronic reoccurrence of BV, treatment with a type of prescribed gel vaginally twice a week is helpful. HOME CARE INSTRUCTIONS   Finish all medication as directed by your caregiver.  Do not have sex until treatment is completed.  Tell your sexual partner that you have a vaginal infection. They should see their caregiver and be treated if they have problems, such as a mild rash or itching.  Practice safe sex. Use condoms. Only have 1 sex partner. PREVENTION  Basic prevention steps can help reduce the risk of upsetting the natural balance of bacteria in the vagina and developing BV:  Do not have sexual intercourse (be abstinent).  Do not douche.  Use all of the medicine prescribed for treatment of BV, even if the signs and symptoms go away.  Tell your sex partner if you have BV. That way, they can be treated, if needed, to prevent reoccurrence. SEEK MEDICAL CARE IF:     Your symptoms are not improving after 3 days of treatment.  You have increased discharge, pain, or fever. MAKE SURE YOU:   Understand these instructions.  Will watch your condition.  Will get help right away if you are not doing well or get worse. FOR MORE INFORMATION  Division of STD Prevention (DSTDP), Centers for Disease Control and Prevention:  [employer] [place] ([place]): www.ashastd.org  Document Released: 11/11/2005 Document Revised: 02/03/2012 Document Reviewed: 06/23/2013 [facility] Patient Information 2014 [place]. Monilial Vaginitis Vaginitis in a soreness, swelling and redness (inflammation) of the vagina and vulva. Monilial vaginitis is not a sexually transmitted infection. CAUSES  Yeast vaginitis is caused by yeast (candida) that is normally found in your vagina. With a yeast infection, the candida has overgrown in number to a point that upsets the chemical balance. SYMPTOMS   White, thick vaginal discharge.  Swelling, itching, redness and irritation of the vagina and possibly the lips of the vagina (vulva).  Burning or painful urination.  Painful intercourse. DIAGNOSIS  Things that may contribute to monilial vaginitis are:  Postmenopausal and virginal states.  Pregnancy.  Infections.  Being tired, sick or stressed, especially if you had monilial vaginitis in the past.  Diabetes. Good control will help lower the chance.  Birth control pills.  Tight fitting garments.  Using bubble bath, feminine sprays, douches or deodorant tampons.  Taking certain medications that kill germs (antibiotics).  Sporadic recurrence can occur if you become ill. TREATMENT  Your caregiver will give you medication.  There are several kinds of anti monilial vaginal creams and suppositories specific for monilial vaginitis. For recurrent yeast infections, use a suppository or cream in the vagina 2 times a week, or as directed.  Anti-monilial or steroid cream for the itching or irritation of the vulva may also be used. Get your caregiver's permission.  Painting the vagina with methylene blue solution may help if the monilial cream does not work.  Eating yogurt may help prevent monilial vaginitis. HOME CARE INSTRUCTIONS   Finish all medication as prescribed.  Do not have sex until  treatment is completed or after your caregiver tells you it is okay.  Take warm sitz baths.  Do not douche.  Do not use tampons, especially scented ones.  Wear cotton underwear.  Avoid tight pants and panty hose.  Tell your sexual partner that you have a yeast infection. They should go to their caregiver if they have symptoms such as mild rash or itching.  Your sexual partner should be treated as well if your infection is difficult to eliminate.  Practice safer sex. Use condoms.  Some vaginal medications cause latex condoms to fail. Vaginal medications that harm condoms are:  Cleocin cream.  Butoconazole (Femstat).  Terconazole (Terazol) vaginal suppository.  Miconazole (Monistat) (may be purchased over the counter). SEEK MEDICAL CARE IF:   You have a temperature by mouth above 102 F (38.9 C).  The infection is getting worse after 2 days of treatment.  The infection is not getting better after 3 days of treatment.  You develop blisters in or around your vagina.  You develop vaginal bleeding, and it is not your menstrual period.  You have pain when you urinate.  You develop intestinal problems.  You have pain with sexual intercourse. Document Released: 08/21/2005 Document Revised: 02/03/2012 Document Reviewed: 05/05/2009 [facility] Patient Information 2014 [place], [place].

## 2013-12-03 NOTE — Progress Notes (Signed)
History:  Kim Johns a 23 y.o. No obstetric history on file. who presents to clinic today for chronic vaginitis. She has according to her had multiple episodes of bacterial/ yeast infections. She are not specific to any partner. She is G0P0 with same partner for last 3 months. Prior to that she was incarcerated. She uses condoms now. She had lab verified yeast on 10/14 and lab verified BV on 10/24.   The following portions of the patient's history were reviewed and updated as appropriate: allergies, current medications, past family history, past medical history, past social history, past surgical history and problem list.  Review of Systems:  Pertinent items are noted in HPI.  Objective:  Physical Exam BP 132/81  Pulse 66  Temp(Src) 97.8 F (36.6 C) (Oral)  Ht 5\' 2"  (1.575 m)  Wt 200 lb 9.6 oz (90.992 kg)  BMI 36.68 kg/m2  LMP 10/30/2013 GENERAL: Well-developed, well-nourished female in no acute distress.  HEENT: Normocephalic, atraumatic.  NECK: Supple. Normal thyroid.  ABDOMEN: Soft, nontender, nondistended. No organomegaly. Normal bowel sounds appreciated in all quadrants.  PELVIC: Normal external female genitalia. Vagina is pink and rugated.  Normal discharge. Normal cervix contour. Wet prep obtained. Uterus is normal in size. No adnexal mass or tenderness.  EXTREMITIES: No cyanosis, clubbing, or edema, 2+ distal pulses.   Labs and Imaging No results found for this or any previous visit (from the past 24 hour(s)).   Assessment & Plan:  Assessment:  Questionable history of chronic yeast and bacterial infections  Plans:  Will repeat wet prep today and call patient with results  Olegario Messier, NP 12/03/2013 10:57 AM

## 2013-12-04 LAB — WET PREP, GENITAL
CLUE CELLS WET PREP: NONE SEEN
TRICH WET PREP: NONE SEEN
WBC, Wet Prep HPF POC: NONE SEEN
Yeast Wet Prep HPF POC: NONE SEEN

## 2013-12-09 ENCOUNTER — Emergency Department (INDEPENDENT_AMBULATORY_CARE_PROVIDER_SITE_OTHER)
Admission: EM | Admit: 2013-12-09 | Discharge: 2013-12-09 | Disposition: A | Discharge status: Home / Self Care | Payer: No Typology Code available for payment source | Source: Home / Self Care | Attending: Family Medicine | Admitting: Family Medicine

## 2013-12-09 ENCOUNTER — Encounter (HOSPITAL_COMMUNITY): Payer: Self-pay | Admitting: Emergency Medicine

## 2013-12-09 DIAGNOSIS — B3731 Acute candidiasis of vulva and vagina: Secondary | ICD-10-CM

## 2013-12-09 DIAGNOSIS — B373 Candidiasis of vulva and vagina: Secondary | ICD-10-CM

## 2013-12-09 LAB — POCT URINALYSIS DIP (DEVICE)
Bilirubin Urine: NEGATIVE
GLUCOSE, UA: NEGATIVE mg/dL
Ketones, ur: NEGATIVE mg/dL
Leukocytes, UA: NEGATIVE
NITRITE: NEGATIVE
PH: 6 (ref 5.0–8.0)
Protein, ur: NEGATIVE mg/dL
Specific Gravity, Urine: >=1.03 (ref 1.005–1.030)
UROBILINOGEN UA: 0.2 mg/dL (ref 0.0–1.0)

## 2013-12-09 LAB — POCT PREGNANCY, URINE: Preg Test, Ur: NEGATIVE

## 2013-12-09 MED ORDER — FLUCONAZOLE 150 MG PO TABS: 150.0000 mg | ORAL_TABLET | ORAL | Status: DC

## 2013-12-09 NOTE — ED Provider Notes (Signed)
Kim Johns is a 23 y.o. female who presents to Urgent Care today for yeast infection. Patient has a history of frequent vaginal yeast infections. She typically was treated with fluconazole. She has had multiple positive workups for this. She was recently seen by an OB/GYN and had a negative wet prep were for several days after this she started her menstrual cycle began having symptoms that are characteristic for her yeast infection including clumpy vaginal discharge and itching. She has an appointment upcoming with her OB/GYN again in the near future would like to avoid a vaginal exam today if possible. She denies any fevers or chills nausea vomiting or diarrhea   Past Medical History  Diagnosis Date  . Allergy   . Asthma    History  Substance Use Topics  . Smoking status: Current Some Day Smoker -- 0.50 packs/day for 10 years  . Smokeless tobacco: Not on file  . Alcohol Use: Yes   ROS as above Medications: No current facility-administered medications for this encounter.   Current Outpatient Prescriptions  Medication Sig Dispense Refill  . amoxicillin-clavulanate (AUGMENTIN) 875-125 MG per tablet Take 1 tablet by mouth 2 (two) times daily.  20 tablet  0  . fluconazole (DIFLUCAN) 150 MG tablet Take 1 tablet (150 mg total) by mouth once a week.  6 tablet  2  . fluticasone (FLONASE) 50 MCG/ACT nasal spray Place 2 sprays into both nostrils daily.  16 g  6  . metroNIDAZOLE (FLAGYL) 500 MG tablet Take 1 tablet (500 mg total) by mouth 2 (two) times daily.  14 tablet  0  . nicotine (EQL NICOTINE) 21 mg/24hr patch Place 1 patch (21 mg total) onto the skin daily.  31 patch  3    Exam:  BP 111/63  Pulse 73  Temp(Src) 98.3 F (36.8 C) (Oral)  Resp 18  SpO2 100%  LMP 10/30/2013  Gen: Well NAD Lungs: Normal work of breathing. CTABL Heart: RRR no MRG Abd: NABS, Soft. NT, ND Exts: Brisk capillary refill, warm and well perfused.   Results for orders placed during the hospital encounter of  12/09/13 (from the past 24 hour(s))  POCT PREGNANCY, URINE     Status: None   Collection Time    12/09/13  4:05 PM      Result Value Range   Preg Test, Ur NEGATIVE  NEGATIVE  POCT URINALYSIS DIP (DEVICE)     Status: Abnormal   Collection Time    12/09/13  4:08 PM      Result Value Range   Glucose, UA NEGATIVE  NEGATIVE mg/dL   Bilirubin Urine NEGATIVE  NEGATIVE   Ketones, ur NEGATIVE  NEGATIVE mg/dL   Specific Gravity, Urine >=1.030  1.005 - 1.030   Hgb urine dipstick TRACE (*) NEGATIVE   pH 6.0  5.0 - 8.0   Protein, ur NEGATIVE  NEGATIVE mg/dL   Urobilinogen, UA 0.2  0.0 - 1.0 mg/dL   Nitrite NEGATIVE  NEGATIVE   Leukocytes, UA NEGATIVE  NEGATIVE   No results found.  Assessment and Plan: 23 y.o. female with vaginal yeast infection. Plan to treat apparently with fluconazole. Followup with OB/GYN.  Discussed warning signs or symptoms. Please see discharge instructions. Patient expresses understanding.    Gregor Hams, MD 12/09/13 [phone]

## 2013-12-09 NOTE — ED Notes (Signed)
C/o yeast infection States she has been having these sx on and off for years States she has tried Monistat but no relief.

## 2013-12-09 NOTE — Discharge Instructions (Signed)
Thank you for coming in today. Take fluconazole once weekly for 2 or 3 weeks. Followup with your OB/GYN Dr.  Lucretia Kern Vulvovaginitis Candidal vulvovaginitis is an infection of the vagina and vulva. The vulva is the skin around the opening of the vagina. This may cause itching and discomfort in and around the vagina.  HOME CARE  Only take medicine as told by your doctor.  Do not have sex (intercourse) until the infection is healed or as told by your doctor.  Practice safe sex.  Tell your sex partner about your infection.  Do not douche or use tampons.  Wear cotton underwear. Do not wear tight pants or panty hose.  Eat yogurt. This may help treat and prevent yeast infections. GET HELP RIGHT AWAY IF:   You have a fever.  Your problems get worse during treatment or do not get better in 3 days.  You have discomfort, irritation, or itching in your vagina or vulva area.  You have pain after sex.  You start to get belly (abdominal) pain. MAKE SURE YOU:  Understand these instructions.  Will watch your condition.  Will get help right away if you are not doing well or get worse. Document Released: 02/07/2009 Document Revised: 02/03/2012 Document Reviewed: 02/07/2009 [facility] Patient Information 2014 [place], [place].

## 2013-12-23 NOTE — Progress Notes (Signed)
Patient ID: Kim Johns, female   DOB: [birth year], 23 y.o.   MRN: [id number]   CC: Followup  HPI: Patient is 23 year old female who comes to clinic for followup. She denies recent sicknesses or hospitalizations. She has been recently diagnosed with asthma exacerbation and bronchitis. She was treated with Augmentin and has completed therapy. She denies fevers and chills, no chest pain or shortness of breath,  Allergies  Allergen Reactions  . Sulfa Antibiotics     Had since childhood not sure of reaction   Past Medical History  Diagnosis Date  . Allergy   . Asthma    Current Outpatient Prescriptions on File Prior to Visit  Medication Sig Dispense Refill  . amoxicillin-clavulanate (AUGMENTIN) 875-125 MG per tablet Take 1 tablet by mouth 2 (two) times daily.  20 tablet  0  . fluticasone (FLONASE) 50 MCG/ACT nasal spray Place 2 sprays into both nostrils daily.  16 g  6  . metroNIDAZOLE (FLAGYL) 500 MG tablet Take 1 tablet (500 mg total) by mouth 2 (two) times daily.  14 tablet  0   No current facility-administered medications on file prior to visit.   Family History  Problem Relation Age of Onset  . Diabetes Mother   . Stroke Mother   . Hypertension Father    History   Social History  . Marital Status: Single    Spouse Name: N/A    Number of Children: N/A  . Years of Education: N/A   Occupational History  . Not on file.   Social History Main Topics  . Smoking status: Current Some Day Smoker -- 0.50 packs/day for 10 years  . Smokeless tobacco: Not on file  . Alcohol Use: Yes  . Drug Use: No  . Sexual Activity: Not on file   Other Topics Concern  . Not on file   Social History Narrative  . No narrative on file    Review of Systems  Constitutional: Negative for fever, chills, diaphoresis, activity change, appetite change and fatigue.  HENT: Negative for ear pain, nosebleeds, congestion, facial swelling, rhinorrhea, neck pain, neck stiffness and ear discharge.    Eyes: Negative for pain, discharge, redness, itching and visual disturbance.  Respiratory: Negative for cough, choking, chest tightness, shortness of breath, wheezing and stridor.   Cardiovascular: Negative for chest pain, palpitations and leg swelling.  Gastrointestinal: Negative for abdominal distention.  Genitourinary: Negative for dysuria, urgency, frequency, hematuria, flank pain, decreased urine volume, difficulty urinating and dyspareunia.  Musculoskeletal: Negative for back pain, joint swelling, arthralgias and gait problem.  Neurological: Negative for dizziness, tremors, seizures, syncope, facial asymmetry, speech difficulty, weakness, light-headedness, numbness and headaches.  Hematological: Negative for adenopathy. Does not bruise/bleed easily.  Psychiatric/Behavioral: Negative for hallucinations, behavioral problems, confusion, dysphoric mood, decreased concentration and agitation.    Objective:   Filed Vitals:   11/30/13 1210  BP: 128/84  Pulse: 84  Temp: 98.8 F (37.1 C)  Resp: 16    Physical Exam  Constitutional: Appears well-developed and well-nourished. No distress.  CVS: RRR, S1/S2 +, no murmurs, no gallops, no carotid bruit.  Pulmonary: Effort and breath sounds normal, no stridor, rhonchi, wheezes, rales.  Abdominal: Soft. BS +,  no distension, tenderness, rebound or guarding.  Musculoskeletal: Normal range of motion. No edema and no tenderness.    Lab Results  Component Value Date   WBC 6.7 10/19/2013   HGB 13.4 10/19/2013   HCT 38.6 10/19/2013   MCV 93.9 10/19/2013   PLT 317 10/19/2013   Lab  Results  Component Value Date   CREATININE 0.87 10/19/2013   BUN 7 10/19/2013   NA 140 10/19/2013   K 4.0 10/19/2013   CL 108 10/19/2013   CO2 24 10/19/2013    Lab Results  Component Value Date   HGBA1C 4.9 08/04/2013   Lipid Panel     Component Value Date/Time   CHOL 149 10/19/2013 1109   TRIG 69 10/19/2013 1109   HDL 52 10/19/2013 1109   CHOLHDL  2.9 10/19/2013 1109   VLDL 14 10/19/2013 1109   LDLCALC 83 10/19/2013 1109       Assessment and plan:   Patient Active Problem List   Diagnosis Date Noted  .  acute asthma exacerbation with bronchitis - now resolved. Patient completed treatment with Augmentin. Discussed importance of handwashing, avoiding sick contacts and exposures  10/19/2013  . Menorrhagia - stable, recent hemoglobin within normal limits.  10/19/2013

## 2014-01-05 NOTE — ED Notes (Signed)
Call from pharmacy, patient has been taking diflucan every other day, instead of 1 x week, and wants refill early , denied per Dr Georgina Snell. Patient MUST be rechecked or follow up with OB-GYN as directed on last [place] visit

## 2014-01-17 ENCOUNTER — Ambulatory Visit: Payer: Medicaid Other | Attending: Internal Medicine | Admitting: Internal Medicine

## 2014-01-17 ENCOUNTER — Encounter: Payer: Self-pay | Admitting: Internal Medicine

## 2014-01-17 VITALS — BP 119/84 | HR 86 | Temp 99.8°F | Resp 17

## 2014-01-17 DIAGNOSIS — M62838 Other muscle spasm: Secondary | ICD-10-CM

## 2014-01-17 DIAGNOSIS — F17200 Nicotine dependence, unspecified, uncomplicated: Secondary | ICD-10-CM | POA: Insufficient documentation

## 2014-01-17 DIAGNOSIS — M549 Dorsalgia, unspecified: Secondary | ICD-10-CM

## 2014-01-17 DIAGNOSIS — J45909 Unspecified asthma, uncomplicated: Secondary | ICD-10-CM | POA: Insufficient documentation

## 2014-01-17 DIAGNOSIS — M5380 Other specified dorsopathies, site unspecified: Secondary | ICD-10-CM | POA: Insufficient documentation

## 2014-01-17 MED ORDER — IBUPROFEN 600 MG PO TABS: 600.0000 mg | ORAL_TABLET | Freq: Three times a day (TID) | ORAL | Status: DC | PRN

## 2014-01-17 MED ORDER — CYCLOBENZAPRINE HCL 10 MG PO TABS: 10.0000 mg | ORAL_TABLET | Freq: Three times a day (TID) | ORAL | Status: DC | PRN

## 2014-01-17 NOTE — Progress Notes (Signed)
Patient here for back pain States it hurts near her upper back between her shoulder blades Hurts when she moves certain ways

## 2014-01-17 NOTE — Progress Notes (Signed)
MRN: [id number] Name: Kim Johns  Sex: female Age: 23 y.o. DOB: 1990/12/20  Allergies: Sulfa antibiotics  Chief Complaint  Patient presents with  . Back Pain    HPI: Patient is 23 y.o. female who reported to have upper back pain on and off for the last one year denies any fall or trauma, certain positions make the pain worse and sometimes it affects her sleep at night, denies any fever chills numbness weakness. Patient tried over-the-counter medication with some improvement.  Past Medical History  Diagnosis Date  . Allergy   . Asthma     History reviewed. No pertinent past surgical history.    Medication List       This list is accurate as of: 01/17/14  2:26 PM.  Always use your most recent med list.               amoxicillin-clavulanate 875-125 MG per tablet  Commonly known as:  AUGMENTIN  Take 1 tablet by mouth 2 (two) times daily.     cyclobenzaprine 10 MG tablet  Commonly known as:  FLEXERIL  Take 1 tablet (10 mg total) by mouth 3 (three) times daily as needed for muscle spasms.     fluconazole 150 MG tablet  Commonly known as:  DIFLUCAN  Take 1 tablet (150 mg total) by mouth once a week.     fluticasone 50 MCG/ACT nasal spray  Commonly known as:  FLONASE  Place 2 sprays into both nostrils daily.     ibuprofen 600 MG tablet  Commonly known as:  ADVIL,MOTRIN  Take 1 tablet (600 mg total) by mouth every 8 (eight) hours as needed.     metroNIDAZOLE 500 MG tablet  Commonly known as:  FLAGYL  Take 1 tablet (500 mg total) by mouth 2 (two) times daily.     nicotine 21 mg/24hr patch  Commonly known as:  EQL NICOTINE  Place 1 patch (21 mg total) onto the skin daily.        Meds ordered this encounter  Medications  . cyclobenzaprine (FLEXERIL) 10 MG tablet    Sig: Take 1 tablet (10 mg total) by mouth 3 (three) times daily as needed for muscle spasms.    Dispense:  30 tablet    Refill:  1  . ibuprofen (ADVIL,MOTRIN) 600 MG tablet    Sig: Take 1  tablet (600 mg total) by mouth every 8 (eight) hours as needed.    Dispense:  30 tablet    Refill:  1     There is no immunization history on file for this patient.  Family History  Problem Relation Age of Onset  . Diabetes Mother   . Stroke Mother   . Hypertension Father     History  Substance Use Topics  . Smoking status: Current Some Day Smoker -- 0.50 packs/day for 10 years  . Smokeless tobacco: Not on file  . Alcohol Use: Yes    Review of Systems   As noted in HPI  Filed Vitals:   01/17/14 1413  BP: 119/84  Pulse: 86  Temp: 99.8 F (37.7 C)  Resp: 17    Physical Exam  Physical Exam  Constitutional: No distress.  Eyes: EOM are normal. Pupils are equal, round, and reactive to light.  Cardiovascular: Normal rate and regular rhythm.   Pulmonary/Chest: Breath sounds normal. No respiratory distress. She has no wheezes. She has no rales.  Musculoskeletal:  Upper back some paraspinal tenderness, some tenderness on the trapezium muscle ,  equal strength both upper extremities   Neurological: She has normal reflexes.    CBC    Component Value Date/Time   WBC 6.7 10/19/2013 1109   RBC 4.11 10/19/2013 1109   HGB 13.4 10/19/2013 1109   HCT 38.6 10/19/2013 1109   PLT 317 10/19/2013 1109   MCV 93.9 10/19/2013 1109   LYMPHSABS 1.3 10/19/2013 1109   MONOABS 0.5 10/19/2013 1109   EOSABS 0.1 10/19/2013 1109   BASOSABS 0.0 10/19/2013 1109    CMP     Component Value Date/Time   NA 140 10/19/2013 1109   K 4.0 10/19/2013 1109   CL 108 10/19/2013 1109   CO2 24 10/19/2013 1109   GLUCOSE 86 10/19/2013 1109   BUN 7 10/19/2013 1109   CREATININE 0.87 10/19/2013 1109   CALCIUM 9.4 10/19/2013 1109   PROT 7.0 10/19/2013 1109   ALBUMIN 4.0 10/19/2013 1109   AST 16 10/19/2013 1109   ALT 8 10/19/2013 1109   ALKPHOS 35* 10/19/2013 1109   BILITOT 0.3 10/19/2013 1109    Lab Results  Component Value Date/Time   CHOL 149 10/19/2013 11:09 AM    No components found  with this basename: hga1c    Lab Results  Component Value Date/Time   AST 16 10/19/2013 11:09 AM    Assessment and Plan  Muscle spasm - Plan: cyclobenzaprine (FLEXERIL) 10 MG tablet each bedtime, also advise patient for heating pad.   Upper back pain - Plan: ibuprofen (ADVIL,MOTRIN) 600 MG tablet when necessary for pain     Return if symptoms worsen or fail to improve.  Lorayne Marek, MD

## 2014-01-21 ENCOUNTER — Emergency Department (INDEPENDENT_AMBULATORY_CARE_PROVIDER_SITE_OTHER)
Admission: EM | Admit: 2014-01-21 | Discharge: 2014-01-21 | Disposition: A | Discharge status: Home / Self Care | Payer: No Typology Code available for payment source | Source: Home / Self Care | Attending: Emergency Medicine | Admitting: Emergency Medicine

## 2014-01-21 ENCOUNTER — Other Ambulatory Visit (HOSPITAL_COMMUNITY)
Admission: RE | Admit: 2014-01-21 | Discharge: 2014-01-21 | Disposition: A | Discharge status: Home / Self Care | Payer: No Typology Code available for payment source | Source: Ambulatory Visit | Attending: Emergency Medicine | Admitting: Emergency Medicine

## 2014-01-21 ENCOUNTER — Encounter (HOSPITAL_COMMUNITY): Payer: Self-pay | Admitting: Emergency Medicine

## 2014-01-21 DIAGNOSIS — N898 Other specified noninflammatory disorders of vagina: Secondary | ICD-10-CM

## 2014-01-21 DIAGNOSIS — N760 Acute vaginitis: Secondary | ICD-10-CM | POA: Insufficient documentation

## 2014-01-21 DIAGNOSIS — Z113 Encounter for screening for infections with a predominantly sexual mode of transmission: Secondary | ICD-10-CM | POA: Insufficient documentation

## 2014-01-21 LAB — POCT URINALYSIS DIP (DEVICE)
Bilirubin Urine: NEGATIVE
Glucose, UA: NEGATIVE mg/dL
HGB URINE DIPSTICK: NEGATIVE
Ketones, ur: NEGATIVE mg/dL
Leukocytes, UA: NEGATIVE
Nitrite: NEGATIVE
PROTEIN: NEGATIVE mg/dL
Specific Gravity, Urine: 1.025 (ref 1.005–1.030)
UROBILINOGEN UA: 0.2 mg/dL (ref 0.0–1.0)
pH: 7 (ref 5.0–8.0)

## 2014-01-21 LAB — POCT PREGNANCY, URINE: Preg Test, Ur: NEGATIVE

## 2014-01-21 MED ORDER — METRONIDAZOLE 500 MG PO TABS: 500.0000 mg | ORAL_TABLET | Freq: Two times a day (BID) | ORAL | Status: DC

## 2014-01-21 NOTE — Discharge Instructions (Signed)
The likely source of your discharge is bacterial vaginosis. Take the Flagyl as directed. Read over instructions. Avoid intercourse for a week to 10 days. If remaining tests return positive for anything else you will be notified by phone for treatment instructions. Follow up as needed with your GYN MD at [facility].

## 2014-01-21 NOTE — ED Notes (Signed)
C/O VAGINAL DISCHARGE.  History of the same.  Reports vaginal discharge, odor and itchiness.  Denies abdominal pain

## 2014-01-21 NOTE — ED Provider Notes (Signed)
Medical screening examination/treatment/procedure(s) were performed by non-physician practitioner and as supervising physician I was immediately available for consultation/collaboration.  Lyric Rossano, M.D.  Abner Ardis C Chaz Mcglasson, MD 01/21/14 2315 

## 2014-01-21 NOTE — ED Provider Notes (Signed)
CSN: [id number]     Arrival date & time 01/21/14  1608 History   First MD Initiated Contact with Patient 01/21/14 1736     Chief Complaint  Patient presents with  . Vaginal Discharge    Patient is a 23 y.o. female presenting with vaginal discharge. The history is provided by the patient.  Vaginal Discharge Quality:  Milky and malodorous Severity:  Moderate Onset quality:  Gradual Duration:  4 days Timing:  Constant Progression:  Worsening Chronicity:  Chronic Context: not after intercourse, not after urination, not at rest, not during bowel movement, not during intercourse, not during pregnancy, not during urination, not genital trauma, not recent antibiotic use and not spontaneously   Relieved by:  None tried Associated symptoms: no abdominal pain, no dyspareunia, no dysuria, no fever, no genital lesions, no nausea, no rash, no urinary frequency, no urinary hesitancy, no urinary incontinence, no vaginal itching and no vomiting   Risk factors: STI and unprotected sex   Risk factors: no endometriosis, no foreign body, no gynecological surgery, no immunosuppression, no new sexual partner, no PID, no prior miscarriage, no STI exposure and no terminated pregnancy   Pt is 23 y/o G-0 w/ report of  4 day h/o milky, foul smelling vag d/c. She believes she has BV again as pt has h/o chronic BV. Remote h/o chalmydia. + unprotected intercourse w/ one partner. Denies itching. LMP 12/31/2013 (normal). Denies fever, abd pain, UTI sx's or other associated sx's.  Past Medical History  Diagnosis Date  . Allergy   . Asthma    History reviewed. No pertinent past surgical history. Family History  Problem Relation Age of Onset  . Diabetes Mother   . Stroke Mother   . Hypertension Father    History  Substance Use Topics  . Smoking status: Current Some Day Smoker -- 0.50 packs/day for 10 years  . Smokeless tobacco: Not on file  . Alcohol Use: Yes   OB History   Grav Para Term Preterm Abortions TAB  SAB Ect Mult Living                 Review of Systems  Constitutional: Negative for fever.  Gastrointestinal: Negative for nausea, vomiting and abdominal pain.  Genitourinary: Positive for vaginal discharge. Negative for bladder incontinence, dysuria, hesitancy and dyspareunia.  All other systems reviewed and are negative.    Allergies  Sulfa antibiotics  Home Medications   Current Outpatient Rx  Name  Route  Sig  Dispense  Refill  . amoxicillin-clavulanate (AUGMENTIN) 875-125 MG per tablet   Oral   Take 1 tablet by mouth 2 (two) times daily.   20 tablet   0   . cyclobenzaprine (FLEXERIL) 10 MG tablet   Oral   Take 1 tablet (10 mg total) by mouth 3 (three) times daily as needed for muscle spasms.   30 tablet   1   . fluconazole (DIFLUCAN) 150 MG tablet   Oral   Take 1 tablet (150 mg total) by mouth once a week.   6 tablet   2   . fluticasone (FLONASE) 50 MCG/ACT nasal spray   Each Nare   Place 2 sprays into both nostrils daily.   16 g   6   . ibuprofen (ADVIL,MOTRIN) 600 MG tablet   Oral   Take 1 tablet (600 mg total) by mouth every 8 (eight) hours as needed.   30 tablet   1   . metroNIDAZOLE (FLAGYL) 500 MG tablet   Oral  Take 1 tablet (500 mg total) by mouth 2 (two) times daily.   14 tablet   0   . nicotine (EQL NICOTINE) 21 mg/24hr patch   Transdermal   Place 1 patch (21 mg total) onto the skin daily.   31 patch   3    BP 125/77  Pulse 80  Temp(Src) 98.2 F (36.8 C) (Oral)  Resp 12  SpO2 100%  LMP 12/31/2013 Physical Exam  Constitutional: She is oriented to person, place, and time. She appears well-developed and well-nourished.  HENT:  Head: Normocephalic and atraumatic.  Eyes: Conjunctivae are normal.  Neck: Neck supple.  Cardiovascular: Normal rate.   Pulmonary/Chest: Effort normal.  Abdominal: Hernia confirmed negative in the right inguinal area and confirmed negative in the left inguinal area.  Genitourinary: No labial fusion.  There is no rash, tenderness, lesion or injury on the right labia. There is no rash, tenderness, lesion or injury on the left labia. Uterus is not deviated, not enlarged, not fixed and not tender. Cervix exhibits no motion tenderness, no discharge and no friability. Right adnexum displays no mass, no tenderness and no fullness. Left adnexum displays no mass, no tenderness and no fullness. No erythema, tenderness or bleeding around the vagina. No foreign body around the vagina. No signs of injury around the vagina. Vaginal discharge found.  Large amount of foul smelling, thick milky like, vag d/c  Lymphadenopathy:       Right: No inguinal adenopathy present.       Left: No inguinal adenopathy present.  Neurological: She is alert and oriented to person, place, and time.  Skin: Skin is warm and dry.  Psychiatric: She has a normal mood and affect.    ED Course  Pelvic exam Date/Time: 01/21/2014 7:08 PM Performed by: Jeryl Columbia Authorized by: Philipp Deputy C Consent: Verbal consent obtained. written consent not obtained. Risks and benefits: risks, benefits and alternatives were discussed Consent given by: patient Patient understanding: patient states understanding of the procedure being performed Required items: required blood products, implants, devices, and special equipment available Patient identity confirmed: verbally with patient and arm band Preparation: Patient was prepped and draped in the usual sterile fashion. Local anesthesia used: no Patient sedated: no Patient tolerance: Patient tolerated the procedure well with no immediate complications.   (including critical care time) Labs Review Labs Reviewed  POCT URINALYSIS DIP (DEVICE)  POCT PREGNANCY, URINE  CERVICOVAGINAL ANCILLARY ONLY   Imaging Review No results found.   MDM  Vaginal discharge (Likely Bacterial Vaginosis)  4 day h/o foul smelling vag d/c. H/o chronic frequent episodes (every 2-22months). See's MD at  [facility] for same. Pelvic exam reveals copious foul smelling "milky" d/c c/w BV. Remaining pelvic exam is unremarkable. Given hx will treat w/ Flagyl x 1 week and f/u remaining cultures. Pt to f/u w/ MD at [facility] as planned.      Jeryl Columbia, NP 01/21/14 1919

## 2014-01-24 LAB — CERVICOVAGINAL ANCILLARY ONLY
Chlamydia: NEGATIVE
Neisseria Gonorrhea: NEGATIVE
Wet Prep (BD Affirm): NEGATIVE
Wet Prep (BD Affirm): NEGATIVE
Wet Prep (BD Affirm): POSITIVE — AB

## 2014-01-26 ENCOUNTER — Ambulatory Visit: Payer: Medicaid Other

## 2014-01-26 NOTE — ED Notes (Signed)
GC/Chlamydia neg., Affirm: Candida and Trich neg., Gardnerella pos.  Pt. adequately treated with Flagyl. Roselyn Meier 01/26/2014

## 2014-02-07 ENCOUNTER — Encounter: Payer: Self-pay | Admitting: Internal Medicine

## 2014-02-07 ENCOUNTER — Ambulatory Visit: Payer: Medicaid Other | Attending: Internal Medicine

## 2014-02-07 ENCOUNTER — Ambulatory Visit (HOSPITAL_BASED_OUTPATIENT_CLINIC_OR_DEPARTMENT_OTHER): Payer: Medicaid Other | Admitting: Internal Medicine

## 2014-02-07 VITALS — BP 125/83 | HR 81 | Temp 98.8°F | Resp 16 | Ht 63.0 in | Wt 199.0 lb

## 2014-02-07 DIAGNOSIS — M62838 Other muscle spasm: Secondary | ICD-10-CM

## 2014-02-07 DIAGNOSIS — Z833 Family history of diabetes mellitus: Secondary | ICD-10-CM | POA: Insufficient documentation

## 2014-02-07 DIAGNOSIS — Z823 Family history of stroke: Secondary | ICD-10-CM | POA: Insufficient documentation

## 2014-02-07 DIAGNOSIS — J45909 Unspecified asthma, uncomplicated: Secondary | ICD-10-CM | POA: Insufficient documentation

## 2014-02-07 DIAGNOSIS — M549 Dorsalgia, unspecified: Secondary | ICD-10-CM

## 2014-02-07 DIAGNOSIS — Z79899 Other long term (current) drug therapy: Secondary | ICD-10-CM | POA: Insufficient documentation

## 2014-02-07 DIAGNOSIS — M542 Cervicalgia: Secondary | ICD-10-CM

## 2014-02-07 DIAGNOSIS — F17200 Nicotine dependence, unspecified, uncomplicated: Secondary | ICD-10-CM | POA: Insufficient documentation

## 2014-02-07 DIAGNOSIS — M5412 Radiculopathy, cervical region: Secondary | ICD-10-CM | POA: Insufficient documentation

## 2014-02-07 DIAGNOSIS — Z8249 Family history of ischemic heart disease and other diseases of the circulatory system: Secondary | ICD-10-CM | POA: Insufficient documentation

## 2014-02-07 MED ORDER — CYCLOBENZAPRINE HCL 10 MG PO TABS: 10.0000 mg | ORAL_TABLET | Freq: Three times a day (TID) | ORAL | Status: DC | PRN

## 2014-02-07 MED ORDER — GABAPENTIN 100 MG PO CAPS: 100.0000 mg | ORAL_CAPSULE | Freq: Three times a day (TID) | ORAL | Status: DC

## 2014-02-07 MED ORDER — IBUPROFEN 600 MG PO TABS: 600.0000 mg | ORAL_TABLET | Freq: Three times a day (TID) | ORAL | Status: DC | PRN

## 2014-02-07 NOTE — Progress Notes (Signed)
Pt here to f/u increased back pain unrelieved by Flexeril and Ibuprofen States pain worsens with activity,burning,pin and needle pain Urine screen negative

## 2014-02-07 NOTE — Progress Notes (Signed)
Patient ID: Kim Johns, female   DOB: Dec 18, 1990, 23 y.o.   MRN: [id number]   CC:  HPI: 23 years old female here for followup for back pain. The patient is complaining of pain to the right side of the C-spine. She has tried Flexeril alternative with ibuprofen and Aleve without relief. Pain is interfering with her daily chores. She once imaging studies to be done to see what is wrong. She occasionally has localized numbness and tingling radiating down into her right arm.   Allergies  Allergen Reactions  . Sulfa Antibiotics     Had since childhood not sure of reaction   Past Medical History  Diagnosis Date  . Allergy   . Asthma    Current Outpatient Prescriptions on File Prior to Visit  Medication Sig Dispense Refill  . fluconazole (DIFLUCAN) 150 MG tablet Take 1 tablet (150 mg total) by mouth once a week.  6 tablet  2  . fluticasone (FLONASE) 50 MCG/ACT nasal spray Place 2 sprays into both nostrils daily.  16 g  6  . metroNIDAZOLE (FLAGYL) 500 MG tablet Take 1 tablet (500 mg total) by mouth 2 (two) times daily.  14 tablet  0  . metroNIDAZOLE (FLAGYL) 500 MG tablet Take 1 tablet (500 mg total) by mouth 2 (two) times daily.  14 tablet  0  . nicotine (EQL NICOTINE) 21 mg/24hr patch Place 1 patch (21 mg total) onto the skin daily.  31 patch  3   No current facility-administered medications on file prior to visit.   Family History  Problem Relation Age of Onset  . Diabetes Mother   . Stroke Mother   . Hypertension Father    History   Social History  . Marital Status: Single    Spouse Name: N/A    Number of Children: N/A  . Years of Education: N/A   Occupational History  . Not on file.   Social History Main Topics  . Smoking status: Current Some Day Smoker -- 0.50 packs/day for 10 years  . Smokeless tobacco: Not on file  . Alcohol Use: Yes  . Drug Use: No  . Sexual Activity: Not on file   Other Topics Concern  . Not on file   Social History Narrative  . No  narrative on file    Review of Systems  Constitutional: Negative for fever, chills, diaphoresis, activity change, appetite change and fatigue.  HENT: Negative for ear pain, nosebleeds, congestion, facial swelling, rhinorrhea, neck pain, neck stiffness and ear discharge.   Eyes: Negative for pain, discharge, redness, itching and visual disturbance.  Respiratory: Negative for cough, choking, chest tightness, shortness of breath, wheezing and stridor.   Cardiovascular: Negative for chest pain, palpitations and leg swelling.  Gastrointestinal: Negative for abdominal distention.  Genitourinary: Negative for dysuria, urgency, frequency, hematuria, flank pain, decreased urine volume, difficulty urinating and dyspareunia.  Musculoskeletal: Negative for back pain, joint swelling, arthralgias and gait problem.  Neurological: Negative for dizziness, tremors, seizures, syncope, facial asymmetry, speech difficulty, weakness, light-headedness, numbness and headaches.  Hematological: Negative for adenopathy. Does not bruise/bleed easily.  Psychiatric/Behavioral: Negative for hallucinations, behavioral problems, confusion, dysphoric mood, decreased concentration and agitation.    Objective:   Filed Vitals:   02/07/14 1607  BP: 125/83  Pulse: 81  Temp: 98.8 F (37.1 C)  Resp: 16    Physical Exam  Constitutional: Appears well-developed and well-nourished. No distress.  HENT: Normocephalic. External right and left ear normal. Oropharynx is clear and moist.  Eyes: Conjunctivae  and EOM are normal. PERRLA, no scleral icterus.  Neck: Normal ROM. Neck supple. No JVD. No tracheal deviation. No thyromegaly.  CVS: RRR, S1/S2 +, no murmurs, no gallops, no carotid bruit.  Pulmonary: Effort and breath sounds normal, no stridor, rhonchi, wheezes, rales.  Abdominal: Soft. BS +,  no distension, tenderness, rebound or guarding.  Musculoskeletal: Normal range of motion. No edema and no tenderness.   Lymphadenopathy: No lymphadenopathy noted, cervical, inguinal. Neuro: Alert. Normal reflexes, muscle tone coordination. No cranial nerve deficit. Skin: Skin is warm and dry. No rash noted. Not diaphoretic. No erythema. No pallor.  Psychiatric: Normal mood and affect. Behavior, judgment, thought content normal.   Lab Results  Component Value Date   WBC 6.7 10/19/2013   HGB 13.4 10/19/2013   HCT 38.6 10/19/2013   MCV 93.9 10/19/2013   PLT 317 10/19/2013   Lab Results  Component Value Date   CREATININE 0.87 10/19/2013   BUN 7 10/19/2013   NA 140 10/19/2013   K 4.0 10/19/2013   CL 108 10/19/2013   CO2 24 10/19/2013    Lab Results  Component Value Date   HGBA1C 4.9 08/04/2013   Lipid Panel     Component Value Date/Time   CHOL 149 10/19/2013 1109   TRIG 69 10/19/2013 1109   HDL 52 10/19/2013 1109   CHOLHDL 2.9 10/19/2013 1109   VLDL 14 10/19/2013 1109   LDLCALC 83 10/19/2013 1109       Assessment and plan:   Patient Active Problem List   Diagnosis Date Noted  . Vaginal spotting 10/19/2013  . Menorrhagia 10/19/2013   Cervical radiculopathy Patient to continue Flexeril and ibuprofen Will start the patient on gabapentin Obtain CT scan of the C-spine in the thoracic spine Sports medicine referral once the CT results are obtained Patient to follow up in 2 months       The patient was given clear instructions to go to ER or return to medical center if symptoms don't improve, worsen or new problems develop. The patient verbalized understanding. The patient was told to call to get any lab results if not heard anything in the next week.

## 2014-02-09 ENCOUNTER — Telehealth: Payer: Self-pay | Admitting: Emergency Medicine

## 2014-02-09 ENCOUNTER — Ambulatory Visit (HOSPITAL_COMMUNITY)
Admission: RE | Admit: 2014-02-09 | Discharge: 2014-02-09 | Disposition: A | Discharge status: Home / Self Care | Payer: Medicaid Other | Source: Ambulatory Visit | Attending: Internal Medicine | Admitting: Internal Medicine

## 2014-02-09 DIAGNOSIS — M546 Pain in thoracic spine: Secondary | ICD-10-CM | POA: Insufficient documentation

## 2014-02-09 DIAGNOSIS — M62838 Other muscle spasm: Secondary | ICD-10-CM

## 2014-02-09 DIAGNOSIS — M542 Cervicalgia: Secondary | ICD-10-CM | POA: Insufficient documentation

## 2014-02-09 DIAGNOSIS — M549 Dorsalgia, unspecified: Secondary | ICD-10-CM

## 2014-02-09 NOTE — Telephone Encounter (Signed)
Message copied by Ricci Barker on Wed Feb 09, 2014  4:52 PM ------      Message from: Allyson Sabal MD, Ascenc[facility] Dike      Created: Wed Feb 09, 2014  2:24 PM       Notify patient that CT scan of the thoracic spine is negative ------

## 2014-02-09 NOTE — Telephone Encounter (Signed)
Pt given Ct scan results

## 2014-02-10 ENCOUNTER — Telehealth: Payer: Self-pay | Admitting: Internal Medicine

## 2014-02-10 NOTE — Telephone Encounter (Signed)
Pt called regarding an MRI, pt states that she spoke to a nurse and they were going to Schedule her an MRI. Pt would like to know when is the date and time. Please contact pt

## 2014-02-10 NOTE — Telephone Encounter (Signed)
Returned patient call Patient not available Left message on machine to return our call 

## 2014-02-15 ENCOUNTER — Telehealth: Payer: Self-pay | Admitting: Internal Medicine

## 2014-02-15 NOTE — Telephone Encounter (Signed)
Pt says she was supposed to be scheduled for MRI but has not received a call back with appt date/time, please f/u with pt.

## 2014-02-16 ENCOUNTER — Telehealth: Payer: Self-pay

## 2014-02-16 NOTE — Telephone Encounter (Signed)
Patient called  Would like to have an MRI of her back Can we order this test for her?

## 2014-02-28 ENCOUNTER — Ambulatory Visit (INDEPENDENT_AMBULATORY_CARE_PROVIDER_SITE_OTHER): Payer: No Typology Code available for payment source | Admitting: General Practice

## 2014-02-28 DIAGNOSIS — Z3201 Encounter for pregnancy test, result positive: Secondary | ICD-10-CM

## 2014-02-28 DIAGNOSIS — Z331 Pregnant state, incidental: Secondary | ICD-10-CM

## 2014-02-28 DIAGNOSIS — Z3490 Encounter for supervision of normal pregnancy, unspecified, unspecified trimester: Secondary | ICD-10-CM

## 2014-02-28 LAB — POCT PREGNANCY, URINE: Preg Test, Ur: POSITIVE — AB

## 2014-02-28 NOTE — Progress Notes (Signed)
Pt here today for pregnancy test, upt positive. Had first positive home upt on Friday. Reports lmp 01/26/14, edd of 11/03/14 given, also reports regular periods. Would like to get prenatal care here. Will do labs today and schedule detail anatomy ultrasound.

## 2014-03-01 LAB — OBSTETRIC PANEL
Antibody Screen: NEGATIVE
Basophils Absolute: 0 10*3/uL (ref 0.0–0.1)
Basophils Relative: 0 % (ref 0–1)
EOS PCT: 1 % (ref 0–5)
Eosinophils Absolute: 0.1 10*3/uL (ref 0.0–0.7)
HEMATOCRIT: 39.2 % (ref 36.0–46.0)
Hemoglobin: 13.8 g/dL (ref 12.0–15.0)
Hepatitis B Surface Ag: NEGATIVE
LYMPHS ABS: 1.7 10*3/uL (ref 0.7–4.0)
LYMPHS PCT: 33 % (ref 12–46)
MCH: 33.7 pg (ref 26.0–34.0)
MCHC: 35.2 g/dL (ref 30.0–36.0)
MCV: 95.8 fL (ref 78.0–100.0)
MONO ABS: 0.4 10*3/uL (ref 0.1–1.0)
Monocytes Relative: 7 % (ref 3–12)
Neutro Abs: 3.1 10*3/uL (ref 1.7–7.7)
Neutrophils Relative %: 59 % (ref 43–77)
PLATELETS: 323 10*3/uL (ref 150–400)
RBC: 4.09 MIL/uL (ref 3.87–5.11)
RDW: 13.6 % (ref 11.5–15.5)
Rh Type: POSITIVE
Rubella: 4.81 Index — ABNORMAL HIGH (ref <0.90)
WBC: 5.3 10*3/uL (ref 4.0–10.5)

## 2014-03-01 LAB — PRESCRIPTION MONITORING PROFILE (19 PANEL)
Amphetamine/Meth: NEGATIVE ng/mL
BENZODIAZEPINE SCREEN, URINE: NEGATIVE ng/mL
BUPRENORPHINE, URINE: NEGATIVE ng/mL
Barbiturate Screen, Urine: NEGATIVE ng/mL
COCAINE METABOLITES: NEGATIVE ng/mL
Cannabinoid Scrn, Ur: NEGATIVE ng/mL
Carisoprodol, Urine: NEGATIVE ng/mL
Creatinine, Urine: 136.09 mg/dL (ref >20.0)
ECSTASY: NEGATIVE ng/mL
FENTANYL URINE: NEGATIVE ng/mL
METHAQUALONE SCREEN (URINE): NEGATIVE ng/mL
Meperidine, Ur: NEGATIVE ng/mL
Methadone Screen, Urine: NEGATIVE ng/mL
Nitrites, Initial: NEGATIVE ug/mL
Opiate Screen, Urine: NEGATIVE ng/mL
Oxycodone Screen, Ur: NEGATIVE ng/mL
PH URINE, INITIAL: 8.2 pH (ref 4.5–8.9)
Phencyclidine, Ur: NEGATIVE ng/mL
Propoxyphene: NEGATIVE ng/mL
Tapentadol, urine: NEGATIVE ng/mL
Tramadol Scrn, Ur: NEGATIVE ng/mL
Zolpidem, Urine: NEGATIVE ng/mL

## 2014-03-01 LAB — HIV ANTIBODY (ROUTINE TESTING W REFLEX): HIV: NONREACTIVE

## 2014-03-02 LAB — HEMOGLOBINOPATHY EVALUATION
HEMOGLOBIN OTHER: 0 %
Hgb A2 Quant: 2.5 % (ref 2.2–3.2)
Hgb A: 97.1 % (ref 96.8–97.8)
Hgb F Quant: 0.4 % (ref 0.0–2.0)
Hgb S Quant: 0 %

## 2014-03-03 ENCOUNTER — Other Ambulatory Visit: Payer: Self-pay | Admitting: Obstetrics & Gynecology

## 2014-03-03 DIAGNOSIS — N390 Urinary tract infection, site not specified: Secondary | ICD-10-CM

## 2014-03-03 MED ORDER — CEPHALEXIN 500 MG PO CAPS: 500.0000 mg | ORAL_CAPSULE | Freq: Four times a day (QID) | ORAL | Status: DC

## 2014-03-04 ENCOUNTER — Emergency Department (HOSPITAL_COMMUNITY)
Admission: EM | Admit: 2014-03-04 | Discharge: 2014-03-04 | Disposition: A | Discharge status: Home / Self Care | Payer: Medicaid Other | Attending: Emergency Medicine | Admitting: Emergency Medicine

## 2014-03-04 ENCOUNTER — Emergency Department (HOSPITAL_COMMUNITY): Payer: Medicaid Other

## 2014-03-04 ENCOUNTER — Encounter (HOSPITAL_COMMUNITY): Payer: Self-pay | Admitting: Emergency Medicine

## 2014-03-04 DIAGNOSIS — J45909 Unspecified asthma, uncomplicated: Secondary | ICD-10-CM | POA: Insufficient documentation

## 2014-03-04 DIAGNOSIS — O43899 Other placental disorders, unspecified trimester: Secondary | ICD-10-CM

## 2014-03-04 DIAGNOSIS — O468X1 Other antepartum hemorrhage, first trimester: Secondary | ICD-10-CM

## 2014-03-04 DIAGNOSIS — N939 Abnormal uterine and vaginal bleeding, unspecified: Secondary | ICD-10-CM

## 2014-03-04 DIAGNOSIS — Z792 Long term (current) use of antibiotics: Secondary | ICD-10-CM | POA: Insufficient documentation

## 2014-03-04 DIAGNOSIS — Z87891 Personal history of nicotine dependence: Secondary | ICD-10-CM | POA: Insufficient documentation

## 2014-03-04 DIAGNOSIS — O418X10 Other specified disorders of amniotic fluid and membranes, first trimester, not applicable or unspecified: Secondary | ICD-10-CM

## 2014-03-04 DIAGNOSIS — O209 Hemorrhage in early pregnancy, unspecified: Secondary | ICD-10-CM | POA: Insufficient documentation

## 2014-03-04 DIAGNOSIS — O9989 Other specified diseases and conditions complicating pregnancy, childbirth and the puerperium: Secondary | ICD-10-CM | POA: Insufficient documentation

## 2014-03-04 DIAGNOSIS — O368990 Maternal care for other specified fetal problems, unspecified trimester, not applicable or unspecified: Secondary | ICD-10-CM | POA: Insufficient documentation

## 2014-03-04 LAB — CBC
HCT: 37.2 % (ref 36.0–46.0)
Hemoglobin: 13.6 g/dL (ref 12.0–15.0)
MCH: 34.3 pg — ABNORMAL HIGH (ref 26.0–34.0)
MCHC: 36.6 g/dL — ABNORMAL HIGH (ref 30.0–36.0)
MCV: 93.9 fL (ref 78.0–100.0)
PLATELETS: 268 10*3/uL (ref 150–400)
RBC: 3.96 MIL/uL (ref 3.87–5.11)
RDW: 12.9 % (ref 11.5–15.5)
WBC: 7.1 10*3/uL (ref 4.0–10.5)

## 2014-03-04 LAB — TYPE AND SCREEN
ABO/RH(D): O POS
Antibody Screen: NEGATIVE

## 2014-03-04 LAB — WET PREP, GENITAL
Clue Cells Wet Prep HPF POC: NONE SEEN
Trich, Wet Prep: NONE SEEN
WBC WET PREP: NONE SEEN
Yeast Wet Prep HPF POC: NONE SEEN

## 2014-03-04 LAB — CULTURE, OB URINE

## 2014-03-04 LAB — OB RESULTS CONSOLE GC/CHLAMYDIA
Chlamydia: NEGATIVE
Gonorrhea: NEGATIVE

## 2014-03-04 LAB — PREGNANCY, URINE: PREG TEST UR: POSITIVE — AB

## 2014-03-04 LAB — HCG, QUANTITATIVE, PREGNANCY: hCG, Beta Chain, Quant, S: 7541 m[IU]/mL — ABNORMAL HIGH (ref <5)

## 2014-03-04 LAB — ABO/RH: ABO/RH(D): O POS

## 2014-03-04 NOTE — ED Provider Notes (Signed)
CSN: [id number]     Arrival date & time 03/04/14  0510 History   None    Chief Complaint  Patient presents with  . Vaginal Bleeding     (Consider location/radiation/quality/duration/timing/severity/associated sxs/prior Treatment) HPI Comments: Patient to use G1 P0, initial positive pregnancy test 4 days ago at [facility] outpatient clinic -- presents with complaint of vaginal bleeding starting early this morning. Patient first noted a clot and then more substantial bleeding which slowed to a spotting at the current time. Patient has mild lower abdominal tenderness. She denies lightheadedness or syncope. No fevers, nausea, vomiting, diarrhea. Onset of symptoms acute. Course is constant. Nothing makes symptoms better worse.  The history is provided by the patient.    Past Medical History  Diagnosis Date  . Allergy   . Asthma    History reviewed. No pertinent past surgical history. Family History  Problem Relation Age of Onset  . Diabetes Mother   . Stroke Mother   . Hypertension Father    History  Substance Use Topics  . Smoking status: Former Smoker -- 0.50 packs/day for 10 years    Types: Cigarettes    Quit date: 02/22/2014  . Smokeless tobacco: Not on file  . Alcohol Use: No   OB History   Grav Para Term Preterm Abortions TAB SAB Ect Mult Living   1              Review of Systems  Constitutional: Negative for fever.  HENT: Negative for rhinorrhea and sore throat.   Eyes: Negative for redness.  Respiratory: Negative for cough.   Cardiovascular: Negative for chest pain.  Gastrointestinal: Positive for abdominal pain. Negative for nausea, vomiting and diarrhea.  Genitourinary: Positive for vaginal bleeding. Negative for dysuria, frequency and flank pain.  Musculoskeletal: Negative for myalgias.  Skin: Negative for rash.  Neurological: Negative for headaches.    Allergies  Sulfa antibiotics  Home Medications   Current Outpatient Rx  Name  Route  Sig   Dispense  Refill  . cephALEXin (KEFLEX) 500 MG capsule   Oral   Take 1 capsule (500 mg total) by mouth 4 (four) times daily.   28 capsule   0    BP 125/67  Pulse 79  Temp(Src) 99 F (37.2 C)  Resp 18  Ht 5\' 3"  (1.6 m)  Wt 198 lb (89.812 kg)  BMI 35.08 kg/m2  SpO2 100%  LMP 01/26/2014  Physical Exam  Nursing note and vitals reviewed. Constitutional: She appears well-developed and well-nourished.  HENT:  Head: Normocephalic and atraumatic.  Eyes: Conjunctivae are normal. Right eye exhibits no discharge. Left eye exhibits no discharge.  Conjunctivae are not pale.  Neck: Normal range of motion. Neck supple.  Cardiovascular: Normal rate and regular rhythm.   No murmur heard. No tachycardia.  Pulmonary/Chest: Effort normal and breath sounds normal.  Abdominal: Soft. Bowel sounds are normal. There is no tenderness. There is no rebound and no guarding.  No tenderness on exam.  Genitourinary: There is no rash, tenderness or lesion on the right labia. There is no rash, tenderness or lesion on the left labia. Cervix exhibits discharge (blood). There is bleeding around the vagina. No tenderness around the vagina. No vaginal discharge found.  Neurological: She is alert.  Skin: Skin is warm and dry.  Psychiatric: She has a normal mood and affect.    ED Course  Procedures (including critical care time) Labs Review Labs Reviewed  HCG, QUANTITATIVE, PREGNANCY - Abnormal; Notable for the following:  hCG, Beta Chain, Quant, S 7541 (*)    All other components within normal limits  CBC - Abnormal; Notable for the following:    MCH 34.3 (*)    MCHC 36.6 (*)    All other components within normal limits  PREGNANCY, URINE - Abnormal; Notable for the following:    Preg Test, Ur POSITIVE (*)    All other components within normal limits  WET PREP, GENITAL  GC/CHLAMYDIA PROBE AMP  TYPE AND SCREEN  ABO/RH   Imaging Review US Ob Comp Less 14 Wks  03/04/2014   CLINICAL DATA:  Early  pregnancy.  Bleeding.  EXAM: OBSTETRIC <14 WK [place] AND TRANSVAGINAL OB US  TECHNIQUE: Both transabdominal and transvaginal ultrasound examinations were performed for complete evaluation of the gestation as well as the maternal uterus, adnexal regions, and pelvic cul-de-sac. Transvaginal technique was performed to assess early pregnancy.  COMPARISON:  None.  FINDINGS: Intrauterine gestational sac: Present, normal in appearance.  Yolk sac:  Present  Embryo:  Not identified  MSD:  14.5  mm   6 w   2  d  [place] EDC: 10/26/2014  Maternal uterus/adnexae: Normal appearing myometrium. There is probably a small amount of subchorionic hemorrhage. The right ovary measures 2.7 by 2.3 x 3.6 cm and contains a 1.7 x 1.9 cm complex cyst. No hyperemia. The left ovary is normal measuring 20 by 28 x 18 mm. No free fluid or adnexal mass.  IMPRESSION: Early intrauterine gestation. Gestational sac and yolk sac present without identifiable fetal pole. Small amount of subchorionic hemorrhage. Gestational age by mean sac diameter 6 weeks 2 days. No free fluid or adnexal mass.   Electronically Signed   By: Nelson Chimes M.D.   On: 03/04/2014 07:27   US Ob Transvaginal  03/04/2014   CLINICAL DATA:  Early pregnancy.  Bleeding.  EXAM: OBSTETRIC <14 WK [place] AND TRANSVAGINAL OB US  TECHNIQUE: Both transabdominal and transvaginal ultrasound examinations were performed for complete evaluation of the gestation as well as the maternal uterus, adnexal regions, and pelvic cul-de-sac. Transvaginal technique was performed to assess early pregnancy.  COMPARISON:  None.  FINDINGS: Intrauterine gestational sac: Present, normal in appearance.  Yolk sac:  Present  Embryo:  Not identified  MSD:  14.5  mm   6 w   2  d  [place] EDC: 10/26/2014  Maternal uterus/adnexae: Normal appearing myometrium. There is probably a small amount of subchorionic hemorrhage. The right ovary measures 2.7 by 2.3 x 3.6 cm and contains a 1.7 x 1.9 cm complex cyst. No hyperemia. The left ovary  is normal measuring 20 by 28 x 18 mm. No free fluid or adnexal mass.  IMPRESSION: Early intrauterine gestation. Gestational sac and yolk sac present without identifiable fetal pole. Small amount of subchorionic hemorrhage. Gestational age by mean sac diameter 6 weeks 2 days. No free fluid or adnexal mass.   Electronically Signed   By: Nelson Chimes M.D.   On: 03/04/2014 07:27     EKG Interpretation None      6:36 AM Patient seen and examined. Work-up initiated. Will need to confirm IUP. Pt hemodynamically stable.   Vital signs reviewed and are as follows: Filed Vitals:   03/04/14 0549  BP:   Pulse: 79  Temp:   Resp:   BP 125/67  Pulse 79  Temp(Src) 99 F (37.2 C)  Resp 18  Ht 5\' 3"  (1.6 m)  Wt 198 lb (89.812 kg)  BMI 35.08 kg/m2  SpO2 100%  LMP 01/26/2014  8:48 AM Patient informed of [place] results. Discussed subchorionic hematoma, increased possibility of miscarriage but not actively miscarrying.   Pelvic performed with nurse at bedside, speculum only, no bimanual. Os visualized. Blood from os. GC swab not placed into os.   Encouraged GYN f/u. She is to go to [facility]U with worsening pain, cramping, increased bleeding. Return to ED with non-pregnancy concerns. She is to avoid strenuous activity, rest at home today.    MDM   Final diagnoses:  Subchorionic hematoma in first trimester  Vaginal bleeding   Patient with first trimester vaginal bleeding. IUP confirmed. Small subchorionic hemorrhage seen. This is likely cause of bleeding. Patient is hemodynamically stable. Os is closed on exam. Patient has followup at [facility] clinic. Return instructions discussed with patient.  No dangerous or life-threatening conditions suspected or identified by history, physical exam, and by work-up. No indications for hospitalization identified.      Carlisle Cater, PA-C 03/04/14 [phone]

## 2014-03-04 NOTE — ED Notes (Signed)
Pelvic exam performed; PA discussed [place] results; RN provided active listening to patient. Pt tearful but relieved.

## 2014-03-04 NOTE — ED Notes (Signed)
Patient transported to Ultrasound 

## 2014-03-04 NOTE — ED Notes (Signed)
Pt ambulated to restroom without distress; coffee declined by family member

## 2014-03-04 NOTE — ED Notes (Signed)
Pt back from US

## 2014-03-04 NOTE — Discharge Instructions (Signed)
Please read and follow all provided instructions.  Your diagnoses today include:  1. Subchorionic hematoma in first trimester   2. Vaginal bleeding     Tests performed today include:  Blood counts and electrolytes  Wet prep - no vaginal infection  Ultrasound - shows pregnancy in uterus, at 6 weeks and 2 days, with blood under placenta  Urine test to look for infection and pregnancy (in women)  Vital signs. See below for your results today.   Medications prescribed:   None  Take any prescribed medications only as directed.  Home care instructions:   Follow any educational materials contained in this packet.  Follow-up instructions: Please follow-up with your gynecologist in the next 3 days for further evaluation of your symptoms. If you do not have a primary care doctor -- see below for referral information.   Return instructions:  SEEK IMMEDIATE MEDICAL ATTENTION IF:  The pain does not go away or becomes severe   A temperature above 101F develops   Repeated vomiting occurs (multiple episodes)   The pain becomes localized to portions of the abdomen. The right side could possibly be appendicitis. In an adult, the left lower portion of the abdomen could be colitis or diverticulitis.   Blood is being passed in stools or vomit (bright red or black tarry stools)   You develop chest pain, difficulty breathing, dizziness or fainting, or become confused, poorly responsive, or inconsolable (young children)  If you have any other emergent concerns regarding your health  Additional Information: Abdominal (belly) pain can be caused by many things. Your caregiver performed an examination and possibly ordered blood/urine tests and imaging (CT scan, x-rays, ultrasound). Many cases can be observed and treated at home after initial evaluation in the emergency department. Even though you are being discharged home, abdominal pain can be unpredictable. Therefore, you need a repeated exam  if your pain does not resolve, returns, or worsens. Most patients with abdominal pain don't have to be admitted to the hospital or have surgery, but serious problems like appendicitis and gallbladder attacks can start out as nonspecific pain. Many abdominal conditions cannot be diagnosed in one visit, so follow-up evaluations are very important.  Your vital signs today were: BP 115/56   Pulse 71   Temp(Src) 98.1 F (36.7 C) (Oral)   Resp 22   Ht 5\' 3"  (1.6 m)   Wt 198 lb (89.812 kg)   BMI 35.08 kg/m2   SpO2 100%   LMP 01/26/2014 If your blood pressure (bp) was elevated above 135/85 this visit, please have this repeated by your doctor within one month. --------------

## 2014-03-04 NOTE — ED Notes (Signed)
Pt states she has noticed some bleeding with urination tonight and is [redacted] weeks pregnant as estimated by her last cycle

## 2014-03-04 NOTE — Progress Notes (Signed)
Called pt and left detailed message that she has a bladder infection which requires antibiotic treatment. Her prescription has been sent to her pharmacy and can be picked up today. She may call back if she has questions.

## 2014-03-04 NOTE — ED Notes (Signed)
PT ambulated with baseline gait; VSS; A&Ox3; no signs of distress; respirations even and unlabored; skin warm and dry; no questions upon discharge.  

## 2014-03-04 NOTE — ED Provider Notes (Signed)
Medical screening examination/treatment/procedure(s) were performed by non-physician practitioner and as supervising physician I was immediately available for consultation/collaboration.   EKG Interpretation None        Blanchie Dessert, MD 03/04/14 1316

## 2014-03-04 NOTE — ED Notes (Signed)
Warm blanket given to family member.

## 2014-03-05 LAB — GC/CHLAMYDIA PROBE AMP
CT Probe RNA: NEGATIVE
GC Probe RNA: NEGATIVE

## 2014-03-07 ENCOUNTER — Encounter (HOSPITAL_COMMUNITY): Payer: Self-pay

## 2014-03-07 ENCOUNTER — Encounter: Payer: Self-pay | Admitting: Obstetrics & Gynecology

## 2014-03-07 ENCOUNTER — Inpatient Hospital Stay (HOSPITAL_COMMUNITY)
Admission: AD | Admit: 2014-03-07 | Discharge: 2014-03-07 | Disposition: A | Discharge status: Home / Self Care | Payer: Medicaid Other | Source: Ambulatory Visit | Attending: Obstetrics & Gynecology | Admitting: Obstetrics & Gynecology

## 2014-03-07 DIAGNOSIS — Z833 Family history of diabetes mellitus: Secondary | ICD-10-CM | POA: Insufficient documentation

## 2014-03-07 DIAGNOSIS — J45909 Unspecified asthma, uncomplicated: Secondary | ICD-10-CM | POA: Insufficient documentation

## 2014-03-07 DIAGNOSIS — Z87891 Personal history of nicotine dependence: Secondary | ICD-10-CM | POA: Insufficient documentation

## 2014-03-07 DIAGNOSIS — O208 Other hemorrhage in early pregnancy: Secondary | ICD-10-CM | POA: Insufficient documentation

## 2014-03-07 DIAGNOSIS — O468X9 Other antepartum hemorrhage, unspecified trimester: Secondary | ICD-10-CM

## 2014-03-07 DIAGNOSIS — R109 Unspecified abdominal pain: Secondary | ICD-10-CM | POA: Insufficient documentation

## 2014-03-07 DIAGNOSIS — O4690 Antepartum hemorrhage, unspecified, unspecified trimester: Secondary | ICD-10-CM

## 2014-03-07 DIAGNOSIS — O418X90 Other specified disorders of amniotic fluid and membranes, unspecified trimester, not applicable or unspecified: Secondary | ICD-10-CM

## 2014-03-07 LAB — CBC
HEMATOCRIT: 37.6 % (ref 36.0–46.0)
Hemoglobin: 13.7 g/dL (ref 12.0–15.0)
MCH: 34.6 pg — ABNORMAL HIGH (ref 26.0–34.0)
MCHC: 36.4 g/dL — ABNORMAL HIGH (ref 30.0–36.0)
MCV: 94.9 fL (ref 78.0–100.0)
Platelets: 235 10*3/uL (ref 150–400)
RBC: 3.96 MIL/uL (ref 3.87–5.11)
RDW: 12.7 % (ref 11.5–15.5)
WBC: 8.6 10*3/uL (ref 4.0–10.5)

## 2014-03-07 NOTE — MAU Provider Note (Signed)
History     CSN: [id number]  Arrival date and time: 03/07/14 1259   First Provider Initiated Contact with Patient 03/07/14 1454      Chief Complaint  Patient presents with  . Vaginal Bleeding  . Abdominal Cramping   HPI  [name] is a 23 y.o. female G1P0 at [redacted]w[redacted]d who presents with questions regarding bleeding and [place] results she received from [facility]. Pt presented to [facility] on 4/10 for vaginal bleeding in pregnancy; she was told that her US showed a small subchorionic hemorrhage.  She feels that her bleeding is worse when she is standing on her feet for a long period of time; pt works at [employer] and wants to know if working is ok. Currently her bleeding is minimal and brown in color. Pt plans to start care at the Clinic downstairs; she is scheduled next week for her first visit. She denies pain at this time.   OB History   Grav Para Term Preterm Abortions TAB SAB Ect Mult Living   1               Past Medical History  Diagnosis Date  . Allergy   . Asthma     Past Surgical History  Procedure Laterality Date  . No past surgeries      Family History  Problem Relation Age of Onset  . Diabetes Mother   . Stroke Mother   . Hypertension Father     History  Substance Use Topics  . Smoking status: Former Smoker -- 0.50 packs/day for 10 years    Types: Cigarettes    Quit date: 02/22/2014  . Smokeless tobacco: Not on file  . Alcohol Use: No    Allergies:  Allergies  Allergen Reactions  . Sulfa Antibiotics     Had since childhood not sure of reaction    Prescriptions prior to admission  Medication Sig Dispense Refill  . cephALEXin (KEFLEX) 500 MG capsule Take 1 capsule (500 mg total) by mouth 4 (four) times daily.  28 capsule  0  . Prenatal Vit-Fe Fumarate-FA (PRENATAL MULTIVITAMIN) TABS tablet Take 1 tablet by mouth daily at 12 noon.       Results for orders placed during the hospital encounter of 03/07/14 (from the past 48 hour(s))  CBC      Status: Abnormal   Collection Time    03/07/14  2:58 PM      Result Value Ref Range   WBC 8.6  4.0 - 10.5 K/uL   RBC 3.96  3.87 - 5.11 MIL/uL   Hemoglobin 13.7  12.0 - 15.0 g/dL   HCT 37.6  36.0 - 46.0 %   MCV 94.9  78.0 - 100.0 fL   MCH 34.6 (*) 26.0 - 34.0 pg   MCHC 36.4 (*) 30.0 - 36.0 g/dL   RDW 12.7  11.5 - 15.5 %   Platelets 235  150 - 400 K/uL    Review of Systems  Constitutional: Negative for fever and chills.  Gastrointestinal: Negative for nausea, vomiting, abdominal pain, diarrhea and constipation.  Genitourinary: Positive for frequency.       No vaginal discharge. + vaginal bleeding; brown, small amount  No dysuria.    Physical Exam   Blood pressure 126/68, pulse 65, temperature 99.1 F (37.3 C), temperature source Oral, resp. rate 16, height 5' 2.5" (1.588 m), weight 90.175 kg (198 lb 12.8 oz), last menstrual period 01/26/2014, SpO2 99.00%.  Physical Exam  Constitutional: She is  oriented to person, place, and time. She appears well-developed and well-nourished. No distress.  HENT:  Head: Normocephalic.  Eyes: Pupils are equal, round, and reactive to light.  Neck: Neck supple.  Respiratory: Effort normal.  GI: Soft. She exhibits no distension. There is no tenderness. There is no rebound and no guarding.  Genitourinary:  Speculum exam: Vagina - Small amount of creamy discharge, no odor Cervix - Small, quarter size brown discharge at cervical os.  Bimanual exam: Cervix closed Uterus non tender, enlarged  Adnexa non tender, no masses bilaterally GC/Chlam, wet prep done Chaperone present for exam.   Musculoskeletal: Normal range of motion.  Neurological: She is alert and oriented to person, place, and time.  Skin: Skin is warm. She is not diaphoretic.  Psychiatric: Her behavior is normal.    MAU Course  Procedures None  MDM CBC Pelvic exam  O positive blood type   Assessment and Plan   A:  1. Vaginal bleeding in pregnancy   2. Subchorionic  hematoma     P:  Discharge home in stable condition Keep your appointment scheduled in the clinic Bleeding precautions discussed Return to MAU as needed, if symptoms worsen   Darrelyn Hillock Rasch, NP 03/07/2014, 2:55 PM

## 2014-03-07 NOTE — MAU Provider Note (Signed)
Attestation of Attending Supervision of Advanced Practitioner (CNM/NP): Evaluation and management procedures were performed by the Advanced Practitioner under my supervision and collaboration.  I have reviewed the Advanced Practitioner's note and chart, and I agree with the management and plan.  Khyron Garno Harraway-Smith 7:45 PM

## 2014-03-07 NOTE — Progress Notes (Signed)
Written and verbal d/c instructions given and understanding voiced. 

## 2014-03-07 NOTE — MAU Note (Signed)
Patient states she was seen at [facility] ED and diagnosed with a [facility]. Patient states she has had bleeding that has been heavy then lighter. Has had abdominal cramping. Denies nausea or vomiting.

## 2014-03-15 ENCOUNTER — Ambulatory Visit (INDEPENDENT_AMBULATORY_CARE_PROVIDER_SITE_OTHER): Payer: Medicaid Other | Admitting: Family Medicine

## 2014-03-15 VITALS — BP 119/72 | HR 73 | Temp 98.4°F | Wt 198.4 lb

## 2014-03-15 DIAGNOSIS — O200 Threatened abortion: Secondary | ICD-10-CM

## 2014-03-15 LAB — POCT URINALYSIS DIP (DEVICE)
Bilirubin Urine: NEGATIVE
GLUCOSE, UA: NEGATIVE mg/dL
Leukocytes, UA: NEGATIVE
Nitrite: NEGATIVE
PH: 6 (ref 5.0–8.0)
Protein, ur: NEGATIVE mg/dL
Specific Gravity, Urine: >=1.03 (ref 1.005–1.030)
Urobilinogen, UA: 0.2 mg/dL (ref 0.0–1.0)

## 2014-03-15 NOTE — Patient Instructions (Signed)
Threatened Miscarriage Bleeding during the first 20 weeks of pregnancy is common. This is sometimes called a threatened miscarriage. This is a pregnancy that is threatening to end before the twentieth week of pregnancy. Often this bleeding stops with bed rest or decreased activities as suggested by your caregiver and the pregnancy continues without any more problems. You may be asked to not have sexual intercourse, have orgasms or use tampons until further notice. Sometimes a threatened miscarriage can progress to a complete or incomplete miscarriage. This may or may not require further treatment. Some miscarriages occur before a woman misses a menstrual period and knows she is pregnant. Miscarriages occur in 43 to 20% of all pregnancies and usually occur during the first 13 weeks of the pregnancy. The exact cause of a miscarriage is usually never known. A miscarriage is natures way of ending a pregnancy that is abnormal or would not make it to term. There are some things that may put you at risk to have a miscarriage, such as:  Hormone problems.  Infection of the uterus or cervix.  Chronic illness, diabetes for example, especially if it is not controlled.  Abnormal shaped uterus.  Fibroids in the uterus.  Incompetent cervix (the cervix is too weak to hold the baby).  Smoking.  Drinking too much alcohol. It's best not to drink any alcohol when you are pregnant.  Taking illegal drugs. TREATMENT  When a miscarriage becomes complete and all products of conception (all the tissue in the uterus) have been passed, often no treatment is needed. If you think you passed tissue, save it in a container and take it to your doctor for evaluation. If the miscarriage is incomplete (parts of the fetus or placenta remain in the uterus), further treatment may be needed. The most common reason for further treatment is continued bleeding (hemorrhage) because pregnancy tissue did not pass out of the uterus. This  often occurs if a miscarriage is incomplete. Tissue left behind may also become infected. Treatment usually is dilatation and curettage (the removal of the remaining products of pregnancy. This can be done by a simple sucking procedure (suction curettage) or a simple scraping of the inside of the uterus. This may be done in the hospital or in the caregiver's office. This is only done when your caregiver knows that there is no chance for the pregnancy to proceed to term. This is determined by physical examination, negative pregnancy test, falling pregnancy hormone count and/or, an ultrasound revealing a dead fetus. Miscarriages are often a very emotional time for prospective mothers and fathers. This is not you or your partners fault. It did not occur because of an inadequacy in you or your partner. Nearly all miscarriages occur because the pregnancy has started off wrongly. At least half of these pregnancies have a chromosomal abnormality. It is almost always not inherited. Others may have developmental problems with the fetus or placenta. This does not always show up even when the products miscarried are studied under the microscope. The miscarriage is nearly always not your fault and it is not likely that you could have prevented it from happening. If you are having emotional and grieving problems, talk to your health care provider and even seek counseling, if necessary, before getting pregnant again. You can begin trying for another pregnancy as soon as your caregiver says it is OK. HOME CARE INSTRUCTIONS   Your caregiver may order bed rest depending on how much bleeding and cramping you are having. You may be limited  bed rest depending on how much bleeding and cramping you are having. You may be limited to only getting up to go to the bathroom. You may be allowed to continue light activity. You may need to make arrangements for the care of your other children and for any other responsibilities.  · Keep track of the number of pads you use each day, how often you have to change pads and how  saturated (soaked) they are. Record this information.  · DO NOT USE TAMPONS. Do not douche, have sexual intercourse or orgasms until approved by your caregiver.  · You may receive a follow up appointment for re-evaluation of your pregnancy and a repeat blood test. Re-evaluation often occurs after 2 days and again in 4 to 6 weeks. It is very important that you follow-up in the recommended time period.  · If you are Rh negative and the father is Rh positive or you do not know the fathers' blood type, you may receive a shot (Rh immune globulin) to help prevent abnormal antibodies that can develop and affect the baby in any future pregnancies.  SEEK IMMEDIATE MEDICAL CARE IF:  · You have severe cramps in your stomach, back, or abdomen.  · You have a sudden onset of severe pain in the lower part of your abdomen.  · You develop chills.  · You run an unexplained temperature of 101° F (38.3° C) or higher.  · You pass large clots or tissue. Save any tissue for your caregiver to inspect.  · Your bleeding increases or you become light-headed, weak, or have fainting episodes.  · You have a gush of fluid from your vagina.  · You pass out. This could mean you have a tubal (ectopic) pregnancy.  Document Released: 11/11/2005 Document Revised: 02/03/2012 Document Reviewed: 06/27/2008  Exit[employer]® Patient Information ©2014 [employer].

## 2014-03-15 NOTE — Addendum Note (Signed)
Addended by: Novella Olive on: 03/15/2014 04:45 PM   Modules accepted: Orders

## 2014-03-15 NOTE — Addendum Note (Signed)
Addended by: Michel Harrow on: 03/15/2014 04:47 PM   Modules accepted: Orders

## 2014-03-15 NOTE — Progress Notes (Signed)
Pt. States she was spotting yesterday but has not spotted today.  Pt. Completed Keflex RX though states she still has the urge to pee and gets up every 71min because she feels like she has to pee.  Pt. Had all new OB lab work done in MAU. Pt. States she does not have an hour to do early 1hr gtt-- pt. Will schedule appointment to come in one morning this week.  Pap today.  Declines flu.

## 2014-03-15 NOTE — Progress Notes (Signed)
Subjective:     Patient ID: Deania Siguenza, female   DOB: Apr 11, 1991, 23 y.o.   MRN: [id number]  HPI Pt had vaginale bleeding and was seen in [facility] ed on 4/10. Pt was reviewd with stable labs on 4/13 but hdid have heavier bleeding. Currently bleeding has resolved and would like to start prenatal care.   Review of Systems No fevers/chills, feeling normal self. Feels tired, no n/v. No other complaints     Objective:   Physical Exam Filed Vitals:   03/15/14 1443  BP: 119/72  Pulse: 73  Temp: 98.4 F (36.9 C)   VSS NAD CTABN no wrc RRR no gmt Nongravid appearing, NTTP,  Vaginal exam not performed No c/c/e, 2+ pulses    Assessment:     Yolanda Huffstetler is a 23 y.o. G1P0 at [redacted]w[redacted]d by R=6 with threatened AB on 10April  Reevaluate bHCG., unable to see on abdominal US. If elevated will start new ob in 2 weeks.  Fredrik Rigger, MD OB Fellow

## 2014-03-16 ENCOUNTER — Telehealth: Payer: Self-pay | Admitting: *Deleted

## 2014-03-16 DIAGNOSIS — O3680X0 Pregnancy with inconclusive fetal viability, not applicable or unspecified: Secondary | ICD-10-CM

## 2014-03-16 LAB — HCG, QUANTITATIVE, PREGNANCY: HCG, BETA CHAIN, QUANT, S: 51194.3 m[IU]/mL

## 2014-03-16 NOTE — Telephone Encounter (Addendum)
Pt called back a second time and left message requesting another vaginal [place].  She is concerned about the subchorionic hemorrhage and states that it is restricting her from activities and work responsibilities. She stated in her message that detailed information may be left on her voice mail. I consulted with Dr. Olevia Bowens and then called the pt back. I informed her that an ultrasound has been ordered. I also advised her that the only restrictions she has are no sex (pelvic rest) and not to lift anything over 10 lbs. Pt voiced understanding. [place] appt given for 4/28 @ 1445.

## 2014-03-16 NOTE — Telephone Encounter (Signed)
Called pt and left message for her to call back and state whether detailed information can be left on her voice mail.  Per Dr. Leslie Andrea: pt's lab test shows she is still pregnant. She should keep her clinic appointment as scheduled on 5/6 @ 0800.

## 2014-03-16 NOTE — Telephone Encounter (Signed)
Patient called and left message that she is returning our phone call and we can leave detailed information on her voicemail if she doesn't answer. Called patient back and informed her of results and to keep her appt as scheduled on 5/6. Patient verbalized understanding and had no further questions

## 2014-03-16 NOTE — Addendum Note (Signed)
Addended by: Langston Reusing on: 03/16/2014 11:15 AM   Modules accepted: Orders

## 2014-03-17 ENCOUNTER — Telehealth: Payer: Self-pay

## 2014-03-17 NOTE — Telephone Encounter (Signed)
Pt called and stated that she is seen a couple days ago and was treated for a UTI wanted to know what the nurse found out concerning her urine cause she is still having the symptoms.

## 2014-03-21 NOTE — Telephone Encounter (Signed)
Called pt. Who states she is OK but is still going to the bathroom all the time. She does not experience burning with urination or any other symptoms. Reviewed urinalysis results and spoke to Dr. Leslie Andrea; no signs of infection. Informed pt. Of this. Pt. Verbalized understanding and gratitude. No further questions or concerns.

## 2014-03-22 ENCOUNTER — Ambulatory Visit (HOSPITAL_COMMUNITY)
Admission: RE | Admit: 2014-03-22 | Discharge: 2014-03-22 | Disposition: A | Discharge status: Home / Self Care | Payer: No Typology Code available for payment source | Source: Ambulatory Visit | Attending: Family Medicine | Admitting: Family Medicine

## 2014-03-22 DIAGNOSIS — O208 Other hemorrhage in early pregnancy: Secondary | ICD-10-CM | POA: Insufficient documentation

## 2014-03-22 DIAGNOSIS — Z3689 Encounter for other specified antenatal screening: Secondary | ICD-10-CM | POA: Insufficient documentation

## 2014-03-22 DIAGNOSIS — O3680X0 Pregnancy with inconclusive fetal viability, not applicable or unspecified: Secondary | ICD-10-CM

## 2014-03-23 ENCOUNTER — Telehealth: Payer: Self-pay | Admitting: *Deleted

## 2014-03-23 NOTE — Telephone Encounter (Signed)
Kim Johns called and left message stating she had an ultrasound yesterday in [facility] and she has some questions. States wants to know if her EDD will change. And wants to know if she still has a subchorionic hemorrhage. States may leave information on voice mail if she does not answer. Per chart review had [place] in radiology yesterday, not [facility]. Still shows subchorionic hemorrhage about same size. [place] not yet reviewed by a provider

## 2014-03-29 NOTE — Telephone Encounter (Signed)
Called patient, no answer- left message stating we are trying to return your phone call, please give [place] a call back at the clinics

## 2014-03-30 ENCOUNTER — Encounter: Payer: Self-pay | Admitting: Obstetrics and Gynecology

## 2014-03-30 ENCOUNTER — Other Ambulatory Visit (HOSPITAL_COMMUNITY): Payer: No Typology Code available for payment source

## 2014-03-30 ENCOUNTER — Ambulatory Visit (INDEPENDENT_AMBULATORY_CARE_PROVIDER_SITE_OTHER): Payer: No Typology Code available for payment source | Admitting: Obstetrics and Gynecology

## 2014-03-30 ENCOUNTER — Other Ambulatory Visit: Payer: Self-pay | Admitting: General Practice

## 2014-03-30 VITALS — BP 116/65 | HR 90 | Temp 98.7°F | Wt 201.2 lb

## 2014-03-30 DIAGNOSIS — Z3400 Encounter for supervision of normal first pregnancy, unspecified trimester: Secondary | ICD-10-CM

## 2014-03-30 DIAGNOSIS — Z3480 Encounter for supervision of other normal pregnancy, unspecified trimester: Secondary | ICD-10-CM

## 2014-03-30 DIAGNOSIS — Z3490 Encounter for supervision of normal pregnancy, unspecified, unspecified trimester: Secondary | ICD-10-CM

## 2014-03-30 DIAGNOSIS — Z3401 Encounter for supervision of normal first pregnancy, first trimester: Secondary | ICD-10-CM | POA: Insufficient documentation

## 2014-03-30 DIAGNOSIS — Z3682 Encounter for antenatal screening for nuchal translucency: Secondary | ICD-10-CM

## 2014-03-30 DIAGNOSIS — E669 Obesity, unspecified: Secondary | ICD-10-CM

## 2014-03-30 LAB — POCT URINALYSIS DIP (DEVICE)
BILIRUBIN URINE: NEGATIVE
GLUCOSE, UA: NEGATIVE mg/dL
HGB URINE DIPSTICK: NEGATIVE
Ketones, ur: NEGATIVE mg/dL
NITRITE: NEGATIVE
Protein, ur: NEGATIVE mg/dL
Specific Gravity, Urine: 1.015 (ref 1.005–1.030)
UROBILINOGEN UA: 0.2 mg/dL (ref 0.0–1.0)
pH: 7 (ref 5.0–8.0)

## 2014-03-30 LAB — GLUCOSE TOLERANCE, 1 HOUR (50G) W/O FASTING: GLUCOSE 1 HOUR GTT: 99 mg/dL (ref 70–140)

## 2014-03-30 MED ORDER — PRENATAL PLUS 27-1 MG PO TABS: 1.0000 | ORAL_TABLET | Freq: Every day | ORAL | Status: DC

## 2014-03-30 NOTE — Progress Notes (Signed)
   Subjective:    Kim Johns is a G1P0 [redacted]w[redacted]d being seen today for her first obstetrical visit.  Her obstetrical history is significant for spotting with [facility] which has resoved. Had [place] x2. . Patient does intend to breast feed. Pregnancy history fully reviewed.  Patient reports fatigue. Works at [employer]. Desired pregnnacy. Has good support.  Filed Vitals:   03/30/14 0808  BP: 116/65  Pulse: 90  Temp: 98.7 F (37.1 C)  Weight: 201 lb 3.2 oz (91.264 kg)    HISTORY: OB History  Gravida Para Term Preterm AB SAB TAB Ectopic Multiple Living  1             # Outcome Date GA Lbr Len/2nd Weight Sex Delivery Anes PTL Lv  1 CUR              Past Medical History  Diagnosis Date  . Allergy   . Asthma    Past Surgical History  Procedure Laterality Date  . No past surgeries     Family History  Problem Relation Age of Onset  . Diabetes Mother   . Stroke Mother   . Hypertension Father      Exam    Uterus:   8-10 wk size  Pelvic Exam:    Perineum: No Hemorrhoids   Vulva: normal   Vagina:  normal mucosa, normal discharge       Cervix: no bleeding following Pap and L/C   Adnexa: not evaluated   Bony Pelvis: average  System: Breast:  normal appearance, no masses or tenderness   Skin: normal coloration and turgor, no rashes    Neurologic: oriented, normal, negative, grossly non-focal   Extremities: Multiple tatoos, normal strength, no edema   HEENT PERRLA, extra ocular movement intact and thyroid without masses   Mouth/Teeth mucous membranes moist, pharynx normal without lesions and dental hygiene good   Neck supple and no masses   Cardiovascular: regular rate and rhythm, no murmurs or gallops   Respiratory:  appears well, vitals normal, no respiratory distress, acyanotic, normal RR, ear and throat exam is normal, neck free of mass or lymphadenopathy, chest clear, no wheezing, crepitations, rhonchi, normal symmetric air entry   Abdomen: soft, non-tender; bowel  sounds normal; no masses,  no organomegaly DT 165   Urinary: urethral meatus normal      Assessment:    Pregnancy: G1P0 at [redacted]w[redacted]d [facility] spotting resolved       Plan:     Initial lab results reviewed Prenatal vitamins. Problem list reviewed and updated. Genetic Screening discussed First Screen: requested.  Ultrasound discussed; fetal survey: ordered.  Follow up in 4 weeks. 50% of 30 min visit spent on counseling and coordination of care.     Alexei Doswell Freida Busman 03/30/2014

## 2014-03-30 NOTE — Patient Instructions (Signed)
Pregnancy - First Trimester  During sexual intercourse, millions of sperm go into the vagina. Only 1 sperm will penetrate and fertilize the female egg while it is in the Fallopian tube. One week later, the fertilized egg implants into the wall of the uterus. An embryo begins to develop into a baby. At 6 to 8 weeks, the eyes and face are formed and the heartbeat can be seen on ultrasound. At the end of 12 weeks (first trimester), all the baby's organs are formed. Now that you are pregnant, you will want to do everything you can to have a healthy baby. Two of the most important things are to get good prenatal care and follow your caregiver's instructions. Prenatal care is all the medical care you receive before the baby's birth. It is given to prevent, find, and treat problems during the pregnancy and childbirth.  PRENATAL EXAMS  · During prenatal visits, your weight, blood pressure, and urine are checked. This is done to make sure you are healthy and progressing normally during the pregnancy.  · A pregnant woman should gain 25 to 35 pounds during the pregnancy. However, if you are overweight or underweight, your caregiver will advise you regarding your weight.  · Your caregiver will ask and answer questions for you.  · Blood work, cervical cultures, other necessary tests, and a Pap test are done during your prenatal exams. These tests are done to check on your health and the probable health of your baby. Tests are strongly recommended and done for HIV with your permission. This is the virus that causes AIDS. These tests are done because medicines can be given to help prevent your baby from being born with this infection should you have been infected without knowing it. Blood work is also used to find out your blood type, previous infections, and follow your blood levels (hemoglobin).  · Low hemoglobin (anemia) is common during pregnancy. Iron and vitamins are given to help prevent this. Later in the pregnancy, blood  tests for diabetes will be done along with any other tests if any problems develop.  · You may need other tests to make sure you and the baby are doing well.  CHANGES DURING THE FIRST TRIMESTER   Your body goes through many changes during pregnancy. They vary from person to person. Talk to your caregiver about changes you notice and are concerned about. Changes can include:  · Your menstrual period stops.  · The egg and sperm carry the genes that determine what you look like. Genes from you and your partner are forming a baby. The female genes determine whether the baby is a boy or a girl.  · Your body increases in girth and you may feel bloated.  · Feeling sick to your stomach (nauseous) and throwing up (vomiting). If the vomiting is uncontrollable, call your caregiver.  · Your breasts will begin to enlarge and become tender.  · Your nipples may stick out more and become darker.  · The need to urinate more. Painful urination may mean you have a bladder infection.  · Tiring easily.  · Loss of appetite.  · Cravings for certain kinds of food.  · At first, you may gain or lose a couple of pounds.  · You may have changes in your emotions from day to day (excited to be pregnant or concerned something may go wrong with the pregnancy and baby).  · You may have more vivid and strange dreams.  HOME CARE INSTRUCTIONS   ·   It is very important to avoid all smoking, alcohol and non-prescribed drugs during your pregnancy. These affect the formation and growth of the baby. Avoid chemicals while pregnant to ensure the delivery of a healthy infant.  · Start your prenatal visits by the 12th week of pregnancy. They are usually scheduled monthly at first, then more often in the last 2 months before delivery. Keep your caregiver's appointments. Follow your caregiver's instructions regarding medicine use, blood and lab tests, exercise, and diet.  · During pregnancy, you are providing food for you and your baby. Eat regular, well-balanced  meals. Choose foods such as meat, fish, milk and other low fat dairy products, vegetables, fruits, and whole-grain breads and cereals. Your caregiver will tell you of the ideal weight gain.  · You can help morning sickness by keeping soda crackers at the bedside. Eat a couple before arising in the morning. You may want to use the crackers without salt on them.  · Eating 4 to 5 small meals rather than 3 large meals a day also may help the nausea and vomiting.  · Drinking liquids between meals instead of during meals also seems to help nausea and vomiting.  · A physical sexual relationship may be continued throughout pregnancy if there are no other problems. Problems may be early (premature) leaking of amniotic fluid from the membranes, vaginal bleeding, or belly (abdominal) pain.  · Exercise regularly if there are no restrictions. Check with your caregiver or physical therapist if you are unsure of the safety of some of your exercises. Greater weight gain will occur in the last 2 trimesters of pregnancy. Exercising will help:  · Control your weight.  · Keep you in shape.  · Prepare you for labor and delivery.  · Help you lose your pregnancy weight after you deliver your baby.  · Wear a good support or jogging bra for breast tenderness during pregnancy. This may help if worn during sleep too.  · Ask when prenatal classes are available. Begin classes when they are offered.  · Do not use hot tubs, steam rooms, or saunas.  · Wear your seat belt when driving. This protects you and your baby if you are in an accident.  · Avoid raw meat, uncooked cheese, cat litter boxes, and soil used by cats throughout the pregnancy. These carry germs that can cause birth defects in the baby.  · The first trimester is a good time to visit your dentist for your dental health. Getting your teeth cleaned is okay. Use a softer toothbrush and brush gently during pregnancy.  · Ask for help if you have financial, counseling, or nutritional needs  during pregnancy. Your caregiver will be able to offer counseling for these needs as well as refer you for other special needs.  · Do not take any medicines or herbs unless told by your caregiver.  · Inform your caregiver if there is any mental or physical domestic violence.  · Make a list of emergency phone numbers of family, friends, hospital, and police and fire departments.  · Write down your questions. Take them to your prenatal visit.  · Do not douche.  · Do not cross your legs.  · If you have to stand for long periods of time, rotate you feet or take small steps in a circle.  · You may have more vaginal secretions that may require a sanitary pad. Do not use tampons or scented sanitary pads.  MEDICINES AND DRUG USE IN PREGNANCY  ·   Take prenatal vitamins as directed. The vitamin should contain 1 milligram of folic acid. Keep all vitamins out of reach of children. Only a couple vitamins or tablets containing iron may be fatal to a baby or young child when ingested.  · Avoid use of all medicines, including herbs, over-the-counter medicines, not prescribed or suggested by your caregiver. Only take over-the-counter or prescription medicines for pain, discomfort, or fever as directed by your caregiver. Do not use aspirin, ibuprofen, or naproxen unless directed by your caregiver.  · Let your caregiver also know about herbs you may be using.  · Alcohol is related to a number of birth defects. This includes fetal alcohol syndrome. All alcohol, in any form, should be avoided completely. Smoking will cause low birth rate and premature babies.  · Street or illegal drugs are very harmful to the baby. They are absolutely forbidden. A baby born to an addicted mother will be addicted at birth. The baby will go through the same withdrawal an adult does.  · Let your caregiver know about any medicines that you have to take and for what reason you take them.  SEEK MEDICAL CARE IF:   You have any concerns or worries during your  pregnancy. It is better to call with your questions if you feel they cannot wait, rather than worry about them.  SEEK IMMEDIATE MEDICAL CARE IF:   · An unexplained oral temperature above 102° F (38.9° C) develops, or as your caregiver suggests.  · You have leaking of fluid from the vagina (birth canal). If leaking membranes are suspected, take your temperature and inform your caregiver of this when you call.  · There is vaginal spotting or bleeding. Notify your caregiver of the amount and how many pads are used.  · You develop a bad smelling vaginal discharge with a change in the color.  · You continue to feel sick to your stomach (nauseated) and have no relief from remedies suggested. You vomit blood or coffee ground-like materials.  · You lose more than 2 pounds of weight in 1 week.  · You gain more than 2 pounds of weight in 1 week and you notice swelling of your face, hands, feet, or legs.  · You gain 5 pounds or more in 1 week (even if you do not have swelling of your hands, face, legs, or feet).  · You get exposed to German measles and have never had them.  · You are exposed to fifth disease or chickenpox.  · You develop belly (abdominal) pain. Round ligament discomfort is a common non-cancerous (benign) cause of abdominal pain in pregnancy. Your caregiver still must evaluate this.  · You develop headache, fever, diarrhea, pain with urination, or shortness of breath.  · You fall or are in a car accident or have any kind of trauma.  · There is mental or physical violence in your home.  Document Released: 11/05/2001 Document Revised: 08/05/2012 Document Reviewed: 05/09/2009  [employer]® Patient Information ©2014 [employer].

## 2014-03-30 NOTE — Telephone Encounter (Signed)
Patient seen today, questions addressed.

## 2014-04-08 ENCOUNTER — Ambulatory Visit: Payer: Self-pay | Admitting: Internal Medicine

## 2014-04-12 ENCOUNTER — Other Ambulatory Visit (HOSPITAL_COMMUNITY)
Admission: RE | Admit: 2014-04-12 | Discharge: 2014-04-12 | Disposition: A | Discharge status: Home / Self Care | Payer: Medicaid Other | Source: Ambulatory Visit | Attending: Emergency Medicine | Admitting: Emergency Medicine

## 2014-04-12 ENCOUNTER — Emergency Department (INDEPENDENT_AMBULATORY_CARE_PROVIDER_SITE_OTHER)
Admission: EM | Admit: 2014-04-12 | Discharge: 2014-04-12 | Discharge status: Against Medical Advice | Payer: Medicaid Other | Source: Home / Self Care | Attending: Emergency Medicine | Admitting: Emergency Medicine

## 2014-04-12 ENCOUNTER — Encounter (HOSPITAL_COMMUNITY): Payer: Self-pay | Admitting: Emergency Medicine

## 2014-04-12 DIAGNOSIS — R350 Frequency of micturition: Secondary | ICD-10-CM

## 2014-04-12 DIAGNOSIS — N760 Acute vaginitis: Secondary | ICD-10-CM | POA: Insufficient documentation

## 2014-04-12 DIAGNOSIS — J069 Acute upper respiratory infection, unspecified: Secondary | ICD-10-CM

## 2014-04-12 DIAGNOSIS — Z113 Encounter for screening for infections with a predominantly sexual mode of transmission: Secondary | ICD-10-CM | POA: Insufficient documentation

## 2014-04-12 LAB — POCT URINALYSIS DIP (DEVICE)
Bilirubin Urine: NEGATIVE
Glucose, UA: NEGATIVE mg/dL
Hgb urine dipstick: NEGATIVE
KETONES UR: NEGATIVE mg/dL
Leukocytes, UA: NEGATIVE
Nitrite: NEGATIVE
PH: 7.5 (ref 5.0–8.0)
Protein, ur: NEGATIVE mg/dL
SPECIFIC GRAVITY, URINE: 1.02 (ref 1.005–1.030)
Urobilinogen, UA: 1 mg/dL (ref 0.0–1.0)

## 2014-04-12 LAB — POCT RAPID STREP A: STREPTOCOCCUS, GROUP A SCREEN (DIRECT): NEGATIVE

## 2014-04-12 NOTE — ED Notes (Signed)
Pt left AMA while speaking w/provider.

## 2014-04-12 NOTE — Discharge Instructions (Signed)
Patient left before any instructions could be given.

## 2014-04-12 NOTE — ED Notes (Signed)
Pt c/o white vag d/c onset 1 week w/some mild itching Pt is [redacted] weeks pregnant Also c/o poss UTI onset 2-3 days Sx include urinary freq/urgency Also c/o cold sx onset yest Sx include productive cough, ST, wheezing, SOB Denies f/v/n/d Alert w/no signs of acute distress.

## 2014-04-12 NOTE — ED Provider Notes (Signed)
Chief Complaint   Chief Complaint  Patient presents with  . Vaginal Discharge  . Urinary Tract Infection  . URI    History of Present Illness   Kim Johns is a 23 year old female who is [redacted] weeks pregnant who presents with urinary symptoms, vaginal discharge, and a sore throat and cough. The past 3 days she's had some urinary urgency and frequency. She denies dysuria or hematuria. She's had no fever, chills, back pain, nausea, or vomiting. She had a urinary tract infection a month ago. She was given a course of Keflex. The past one to 2 weeks she's had some vaginal discharge and itching. She thinks she might have a yeast infection. She denies any odor. She's had no pelvic pain or bleeding. Also for the past 2 days she's had sore throat, cough productive yellow sputum, and wheezing. She has a history of asthma in the past. She does not have an inhaler. So far her pregnancy has been complicated by a sub-chorionic hemorrhage, but this has resolved and right now she has no pain or bleeding.  Review of Systems   Other than as noted above, the patient denies any of the following symptoms: Systemic:  No fever or chills GI:  No abdominal pain, nausea, vomiting, diarrhea, constipation, melena or hematochezia. GU:  No dysuria, frequency, urgency, hematuria, vaginal discharge, itching, or abnormal vaginal bleeding.  [place]   Past medical history, family history, social history, meds, and allergies were reviewed.  She is allergic to sulfa. She takes a lot of.  Physical Examination    Vital signs:  BP 122/73  Pulse 80  Temp(Src) 98.4 F (36.9 C) (Oral)  Resp 18  SpO2 100%  LMP 01/26/2014 General:  Alert, oriented and in no distress. ENT: Pharynx was slightly erythematous with some cobblestoning, there was no exudate or drainage. Lungs:  Breath sounds clear and equal bilaterally.  No wheezes, rales or rhonchi. Heart:  Regular rhythm.  No gallops or murmers. Abdomen:  Soft, flat and  non-distended.  No organomegaly or mass.  No tenderness, guarding or rebound.  Bowel sounds normally active. Pelvic exam:  There was a moderate amount of white to yellow discharge. No pain on cervical motion. Uterus was enlarged at a 12 week size without tenderness. There was no adnexal tenderness on the right and mild adnexal tenderness on the left.  DNA probes for gonorrhea, Chlamydia, Trichomonas, Gardnerella, Candida were obtained. Skin:  Clear, warm and dry.  Labs   Results for orders placed during the hospital encounter of 04/12/14  POCT URINALYSIS DIP (DEVICE)      Result Value Ref Range   Glucose, UA NEGATIVE  NEGATIVE mg/dL   Bilirubin Urine NEGATIVE  NEGATIVE   Ketones, ur NEGATIVE  NEGATIVE mg/dL   Specific Gravity, Urine 1.020  1.005 - 1.030   Hgb urine dipstick NEGATIVE  NEGATIVE   pH 7.5  5.0 - 8.0   Protein, ur NEGATIVE  NEGATIVE mg/dL   Urobilinogen, UA 1.0  0.0 - 1.0 mg/dL   Nitrite NEGATIVE  NEGATIVE   Leukocytes, UA NEGATIVE  NEGATIVE  POCT RAPID STREP A ([facility] CARE ONLY)      Result Value Ref Range   Streptococcus, Group A Screen (Direct) NEGATIVE  NEGATIVE    The urine was cultured.  Course in Urgent [place]   Results in the DNA probes are pending. I told the patient that I would be glad to treat her with something that would be safe during pregnancy for possible  yeast infection. The recommended treatment is clotrimazole vaginal tablets one day for one week. The patient states that she cannot afford this and she does not think it would work. Diflucan is category C. but only at 150 once a day dosage. The patient wants me to prescribe Diflucan 150 mg daily for 4 days. This is a category D., therefore I told her I couldn't not prescribe it. I told the patient this, describing that it could potentially result in harm to the fetus. She became upset, and abruptly left the exam room and left the facility. She refused to come back for any other treatment.  Assessment    The primary encounter diagnosis was Vaginitis. Diagnoses of Urinary frequency and URI (upper respiratory infection) were also pertinent to this visit.       Plan    1.  Meds:  The following meds were prescribed:   New Prescriptions   No medications on file   No medications or prescriptions could be given, since the patient abruptly left the exam room and the facility and refused to return to discuss any alternative treatments.    Harden Mo, MD 04/12/14 [id number]

## 2014-04-13 ENCOUNTER — Encounter (HOSPITAL_COMMUNITY): Payer: Self-pay | Admitting: Emergency Medicine

## 2014-04-13 ENCOUNTER — Telehealth: Payer: Self-pay | Admitting: *Deleted

## 2014-04-13 ENCOUNTER — Emergency Department (HOSPITAL_COMMUNITY)
Admission: EM | Admit: 2014-04-13 | Discharge: 2014-04-13 | Disposition: A | Discharge status: Home / Self Care | Payer: Medicaid Other | Attending: Emergency Medicine | Admitting: Emergency Medicine

## 2014-04-13 DIAGNOSIS — Z87891 Personal history of nicotine dependence: Secondary | ICD-10-CM | POA: Insufficient documentation

## 2014-04-13 DIAGNOSIS — B373 Candidiasis of vulva and vagina: Secondary | ICD-10-CM

## 2014-04-13 DIAGNOSIS — B3731 Acute candidiasis of vulva and vagina: Secondary | ICD-10-CM

## 2014-04-13 DIAGNOSIS — J45901 Unspecified asthma with (acute) exacerbation: Secondary | ICD-10-CM | POA: Insufficient documentation

## 2014-04-13 DIAGNOSIS — J40 Bronchitis, not specified as acute or chronic: Secondary | ICD-10-CM

## 2014-04-13 LAB — URINE CULTURE

## 2014-04-13 MED ORDER — ALBUTEROL SULFATE (2.5 MG/3ML) 0.083% IN NEBU
INHALATION_SOLUTION | RESPIRATORY_TRACT | Status: AC
  Filled 2014-04-13: qty 6

## 2014-04-13 MED ORDER — FLUCONAZOLE 150 MG PO TABS: 150.0000 mg | ORAL_TABLET | Freq: Once | ORAL | Status: DC

## 2014-04-13 MED ORDER — IPRATROPIUM BROMIDE 0.02 % IN SOLN
0.5000 mg | Freq: Once | RESPIRATORY_TRACT | Status: AC
  Administered 2014-04-13: 0.5 mg via RESPIRATORY_TRACT
  Filled 2014-04-13: qty 2.5

## 2014-04-13 MED ORDER — ALBUTEROL (5 MG/ML) CONTINUOUS INHALATION SOLN
INHALATION_SOLUTION | RESPIRATORY_TRACT | Status: AC
  Filled 2014-04-13: qty 20

## 2014-04-13 MED ORDER — ALBUTEROL SULFATE (2.5 MG/3ML) 0.083% IN NEBU
5.0000 mg | INHALATION_SOLUTION | Freq: Once | RESPIRATORY_TRACT | Status: AC
  Administered 2014-04-13: 5 mg via RESPIRATORY_TRACT
  Filled 2014-04-13: qty 6

## 2014-04-13 MED ORDER — IPRATROPIUM BROMIDE 0.02 % IN SOLN
RESPIRATORY_TRACT | Status: AC
  Filled 2014-04-13: qty 2.5

## 2014-04-13 MED ORDER — ALBUTEROL SULFATE HFA 108 (90 BASE) MCG/ACT IN AERS
2.0000 | INHALATION_SPRAY | RESPIRATORY_TRACT | Status: DC | PRN
  Administered 2014-04-13: 2 via RESPIRATORY_TRACT
  Filled 2014-04-13: qty 6.7

## 2014-04-13 MED ORDER — AZITHROMYCIN 250 MG PO TABS: 250.0000 mg | ORAL_TABLET | Freq: Every day | ORAL | Status: DC

## 2014-04-13 NOTE — ED Provider Notes (Signed)
CSN: [id number]     Arrival date & time 04/13/14  0612 History   None    Chief Complaint  Patient presents with  . Asthma  . Shortness of Breath  . Cough     (Consider location/radiation/quality/duration/timing/severity/associated sxs/prior Treatment) HPI  Patient presents to the ER with complaints of coughing, sore throat and wheezing for the past couple of days she is also approx 13-[redacted] weeks pregnant. She has not been having any abdominal pains, cramping, nausea, vomiting or diarrhea. She has no complaints with the baby. She says her cough is productive with light green sputum. She has a hx of asthma but has not had any problems with it "for a very long time". Denies ear pain, CP,back pain, vaginal or urinary symptoms. No headaches, neck pain or fevers. Pt has normal vital signs in triage (except pulse is 103).  Past Medical History  Diagnosis Date  . Allergy   . Asthma    Past Surgical History  Procedure Laterality Date  . No past surgeries     Family History  Problem Relation Age of Onset  . Diabetes Mother   . Stroke Mother   . Hypertension Father    History  Substance Use Topics  . Smoking status: Former Smoker -- 0.50 packs/day for 10 years    Types: Cigarettes    Quit date: 02/22/2014  . Smokeless tobacco: Not on file  . Alcohol Use: No   OB History   Grav Para Term Preterm Abortions TAB SAB Ect Mult Living   1              Review of Systems  HENT: Positive for sore throat.   Respiratory: Positive for cough and wheezing.   Gastrointestinal: Negative for vomiting and abdominal pain.  Genitourinary: Negative for vaginal bleeding and vaginal discharge.  All other systems reviewed and are negative.       Allergies  Shellfish allergy and Sulfa antibiotics  Home Medications   Prior to Admission medications   Medication Sig Start Date End Date Taking? Authorizing Provider  Prenatal Vit-Fe Fumarate-FA (PRENATAL MULTIVITAMIN) TABS tablet Take 1 tablet by  mouth daily at 12 noon.   Yes Historical Provider, MD   BP 141/66  Pulse 103  Temp(Src) 98.5 F (36.9 C) (Oral)  Resp 17  Ht 5\' 2"  (1.575 m)  Wt 201 lb (91.173 kg)  BMI 36.75 kg/m2  SpO2 99%  LMP 01/26/2014 Physical Exam  Constitutional: She appears well-developed and well-nourished.  HENT:  Head: Normocephalic and atraumatic.  Left Ear: External ear normal.  Nose: Nose normal.  Mouth/Throat: Oropharynx is clear and moist. No oropharyngeal exudate.  Eyes: Conjunctivae are normal. Pupils are equal, round, and reactive to light.  Neck: Trachea normal, normal range of motion and full passive range of motion without pain. Neck supple.  Cardiovascular: Normal rate, regular rhythm and normal pulses.   Pulmonary/Chest: Effort normal and breath sounds normal. No respiratory distress. Wheezes: decreased airway movement with minimal wheezing. Chest wall is not dull to percussion. She exhibits no tenderness, no crepitus, no edema, no deformity and no retraction.  Abdominal: Soft. Normal appearance and bowel sounds are normal.  Musculoskeletal: Normal range of motion.  Neurological: She is alert. She has normal strength.  Skin: Skin is warm, dry and intact.  Psychiatric: She has a normal mood and affect. Her speech is normal and behavior is normal. Judgment and thought content normal. Cognition and memory are normal.    ED Course  Procedures (including  critical care time) Labs Review Labs Reviewed - No data to display  Imaging Review No results found.   EKG Interpretation None      MDM   Final diagnoses:  Bronchitis    Kim Johns required two breathing treatments. They significantly opened up her lungs and helped increase her airway movement. She is not showing any signs of respiratory distress. Will give an albuterol inhaler for home and a prescription for Azithromycin. She has been instructed to call her OB and make aware of todays vists. To drink lots of water and Tylenol for  pain.   23 y.o.Kim Johns's evaluation in the Emergency Department is complete. It has been determined that no acute conditions requiring further emergency intervention are present at this time. The patient/guardian have been advised of the diagnosis and plan. We have discussed signs and symptoms that warrant return to the ED, such as changes or worsening in symptoms.  Vital signs are stable at discharge. Filed Vitals:   04/13/14 0620  BP: 141/66  Pulse: 103  Temp: 98.5 F (36.9 C)  Resp: 17    Patient/guardian has voiced understanding and agreed to follow-up with the PCP or specialist.    Linus Mako, PA-C 04/13/14 [phone]

## 2014-04-13 NOTE — ED Notes (Addendum)
Patient with increased shortness of breath since yesterday.  Patient states she has also been coughing, productive in nature and now with sore throat.  Patient is CAOx3.  Patient states she has been wheezing since yesterday. Patient is [redacted] weeks pregnant.

## 2014-04-13 NOTE — ED Notes (Signed)
Given ice chips per pt request

## 2014-04-13 NOTE — Discharge Instructions (Signed)
Bronchitis Bronchitis is inflammation of the airways that extend from the windpipe into the lungs (bronchi). The inflammation often causes mucus to develop, which leads to a cough. If the inflammation becomes severe, it may cause shortness of breath. CAUSES  Bronchitis may be caused by:   Viral infections.   Bacteria.   Cigarette smoke.   Allergens, pollutants, and other irritants.  SIGNS AND SYMPTOMS  The most common symptom of bronchitis is a frequent cough that produces mucus. Other symptoms include:  Fever.   Body aches.   Chest congestion.   Chills.   Shortness of breath.   Sore throat.  DIAGNOSIS  Bronchitis is usually diagnosed through a medical history and physical exam. Tests, such as chest X-rays, are sometimes done to rule out other conditions.  TREATMENT  You may need to avoid contact with whatever caused the problem (smoking, for example). Medicines are sometimes needed. These may include:  Antibiotics. These may be prescribed if the condition is caused by bacteria.  Cough suppressants. These may be prescribed for relief of cough symptoms.   Inhaled medicines. These may be prescribed to help open your airways and make it easier for you to breathe.   Steroid medicines. These may be prescribed for those with recurrent (chronic) bronchitis. HOME CARE INSTRUCTIONS  Get plenty of rest.   Drink enough fluids to keep your urine clear or pale yellow (unless you have a medical condition that requires fluid restriction). Increasing fluids may help thin your secretions and will prevent dehydration.   Only take over-the-counter or prescription medicines as directed by your health care provider.  Only take antibiotics as directed. Make sure you finish them even if you start to feel better.  Avoid secondhand smoke, irritating chemicals, and strong fumes. These will make bronchitis worse. If you are a smoker, quit smoking. Consider using nicotine gum or  skin patches to help control withdrawal symptoms. Quitting smoking will help your lungs heal faster.   Put a cool-mist humidifier in your bedroom at night to moisten the air. This may help loosen mucus. Change the water in the humidifier daily. You can also run the hot water in your shower and sit in the bathroom with the door closed for 5 10 minutes.   Follow up with your health care provider as directed.   Wash your hands frequently to avoid catching bronchitis again or spreading an infection to others.  SEEK MEDICAL CARE IF: Your symptoms do not improve after 1 week of treatment.  SEEK IMMEDIATE MEDICAL CARE IF:  Your fever increases.  You have chills.   You have chest pain.   You have worsening shortness of breath.   You have bloody sputum.  You faint.  You have lightheadedness.  You have a severe headache.   You vomit repeatedly. MAKE SURE YOU:   Understand these instructions.  Will watch your condition.  Will get help right away if you are not doing well or get worse. Document Released: 11/11/2005 Document Revised: 09/01/2013 Document Reviewed: 07/06/2013 [facility] Patient Information 2014 [place]. Pregnancy and Medications Most of the time, medicine a pregnant woman is taking does not enter the fetus, but sometimes it can. This may cause damage or birth defects. The risk of damage being done to a fetus is the greatest in the first few weeks of pregnancy. This is when major organs are developing. If you are taking any prescription, over-the-counter or herbal medicines, it is best to talk to your caregiver about the medications you  are taking before getting pregnant. If you become pregnant, stop taking over-the-counter and herbal medications right away. Tell your caregiver what you were taking. Also, tell him/her medications, if any, you took before knowing you were pregnant. Never take any drugs during pregnancy unless your caregiver gives you  permission. Other things like caffeine, vitamins, herbal teas and remedies can affect the growing fetus. Talk to your caregiver about cutting down on caffeine. Also, ask what type of vitamins you need to take. Never use any herbal product without talking to your caregiver first.  MEDICINES THAT ARE NOT SAFE TO TAKE DURING PREGNANCY   Category A - drugs that have been tested for safety during pregnancy and have been found to be safe. This includes drugs such as:  Folic acid.  Vitamin B6.  Thyroid medicine in moderation or in prescribed doses.  Category B - drugs that have been used a lot during pregnancy and do not appear to cause major birth defects or other problems. This includes drugs such as:  Some antibiotics.  Acetaminophen (Tylenol).  Aspartame (artificial sweetener).  Famotidine (Pepcid).  Prednisone (cortisone).  Insulin (for diabetes).  Ibuprofen (Advil, Motrin) before the third trimester. Pregnant women should not take ibuprofen during the last three months of pregnancy.  Category C - drugs that are more likely to cause problems for the mother or fetus. It also includes drugs for which safety studies have not been finished. The majority of these drugs do not have safety studies in progress. These drugs often come with a warning that they should be used only if the benefits of taking them outweigh the risks. This is something a woman would need to carefully discuss with her caregiver. These drugs include:  Prochlorperzaine (Compazine).  Sudafed.  Fluconazole (Diflucan).  Ciprofloxacin (Cipro).  Some antidepressants are also included in this group.  Category D - drugs that have clear health risks for the fetus. They include:  Alcohol.  Lithium (used to treat manic depression).  Phenytoin (Dilantin).  Most chemotherapy drugs to treat cancer. In some cases, chemotherapy drugs are given during pregnancy.  Category X - drugs that have been shown to  cause birth defects. They should never be taken during pregnancy. These include:  Drugs to treat skin conditions like cystic acne (Accutane) and psoriasis (Tegison or Soriatane).  Thalidomide (a sedative).  Diethylstilbestrol or DES. No medication is considered 100% safe to take when pregnant because everyone reacts to drugs differently. Aspirin and other drugs containing salicylate are not recommended during pregnancy, especially during the last three months because it can cause bleeding. In rare cases, a woman's caregiver may want her to use these types of drugs under close watch. Acetylsalicylate, a common ingredient in many over-the-counter painkillers, may make a pregnancy last longer. It may cause severe bleeding before and after delivery.  SHOULD I AVOID TAKING ANY MEDICINE WHILE I AM PREGNANT?  Whether or not you should continue taking medicine during pregnancy is a serious question. However, if you stop taking medicine that you need, this could harm both you and your baby. Talk to your caregiver about if the benefits outweigh the risk for you and your baby.  Pregnant and nursing women who need medication for psychiatric conditions should consult with their obstetrician, pediatrician and mental health care provider before taking any medication. NATURAL MEDICATIONS OR HERBAL REMEDIES WHEN YOU ARE PREGNANT While some herbal remedies claim they will help with pregnancy, there are no studies to prove these claims are true. Likewise, there are  very few studies to look at how safe and effective herbal remedies are during pregnancy. Do not take any herbal products without talking to your caregiver first. These products may contain agents that could harm you and the growing fetus. They could cause problems with your pregnancy.  If you think or know that your mother took diethylstilbesterol (DES), a factory made estrogen, when she was pregnant with you, talk with your caregiver right away. Ask her or  him about:  What types of tests you may need.  How often they need to be done.  Anything else you may need to do to make sure you do not develop any problems. A woman whose mother was given DES when pregnant should be followed and screened for abnormalities of her female organs all through her life. OVER-THE-COUNTER MEDICATIONS THAT ARE SAFE TO TAKE DURING PREGNANCY: Any medication taken during pregnancy should be taken only with the permission of your caregiver.  Allergy (Benadryl).  Cold and Flu, Tylenol (acetaminophen). Tylenol Cold, warm salt water gargles, saline nasal drops.  Constipation (Metamucil, Citrucil, Fiberall/Fobricon, Colace, Milk of Magnesia , Senekot).  Diarrhea (Kaopectate, Immodium, Parepectolin). You should not take these in the first trimester and only take the medication for 24 hours. Call your caregiver if you still have the diarrhea.  Headache (Tylenol).  Ointment for cuts and scrapes (J & J, Bacitracin, Neosporin).  Heartburn (Tums, Riopan, titralac , Gaviscon).  Hemorrhoids (Preparation H, anusol, tucks, Witch hazel).  Nausea and vomiting (Vitamin B6 100mg  tablets, emetrol if you are not diabetic, Emetrex, sea bands).  Rashes (hydrocortisone cream or ointment, caladryl lotion or cream, benadryl cream or capsules, oatmeal bath).  Yeast infection of the vagina (Monistat cream or tablets, Terazol cream). Document Released: 11/11/2005 Document Revised: 07/14/2013 Document Reviewed: 07/26/2009 [facility] Patient Information 2014 [place].

## 2014-04-13 NOTE — Telephone Encounter (Signed)
Kim Johns called and left a message she went to [facility] because she has a yeast infection and didn't think she could get into our clinic same day.  States they said she probably had a yeast infection but would only give me cream because I am pregnant -  which I know the cream doesn't work for me and it is is expensive and my insurance doesn't cover medications. States she has had yeast her entire life basically every other month. States doesn't know what to do but left urgent care because she can;t pay for the cream and it doesn't work.  Please call.  Per chart review went to urgent care 5/19 for vaginal discharge, uti, uri but left before meds prescribed. Then went to ER 04/13/14 and diagnosed with asthma which she has had for a while but no flare for a long time and prescribed azithromycin and told to follow up with ob. Per protocol may have diflucan x1.  Next ob visit 04/26/14.  Per chart review has been sent to [facility] and completed finanical counseling, but dnka md appt.  Will advise patient to keep ob appt as scheduled, follow up with [facility] for uri follow up, urgent care as needed.   Discussed with provider agrees with plan, may also tell patient to take probiotics and /or activia to help with chronic yeast infections.    Called [place] and left a message we are returning her call, please call clinic. Will eprescribe diflucan to pharmacy on file.

## 2014-04-13 NOTE — Telephone Encounter (Addendum)
Kim Johns called and left a message she is returning our call.   Kim Johns and we discussed she can have diflucan for yeast and I sent that to her pharmacy.  Also discussed she should reschedule appointment with [facility] to establish primary care for her asthma as she may other flareups .  Also discussed using activia yogurt or probiotics for chronic yeast and safe in pregnancy. Discussed important to keep her ob appointments and appts with [facility] to keep asthma well controlled .  Call us back as needed or go to urgent care/ ER if needed for sob, etc urgent non ob need.

## 2014-04-14 ENCOUNTER — Telehealth: Payer: Self-pay

## 2014-04-14 ENCOUNTER — Telehealth: Payer: Self-pay | Admitting: Pharmacist

## 2014-04-14 ENCOUNTER — Ambulatory Visit: Payer: Medicaid Other | Attending: Internal Medicine | Admitting: *Deleted

## 2014-04-14 VITALS — BP 109/73 | HR 87 | Temp 99.1°F | Resp 16

## 2014-04-14 DIAGNOSIS — J209 Acute bronchitis, unspecified: Secondary | ICD-10-CM

## 2014-04-14 LAB — CULTURE, GROUP A STREP

## 2014-04-14 MED ORDER — ALBUTEROL SULFATE HFA 108 (90 BASE) MCG/ACT IN AERS: 2.0000 | INHALATION_SPRAY | Freq: Four times a day (QID) | RESPIRATORY_TRACT | Status: DC | PRN

## 2014-04-14 MED ORDER — ALBUTEROL SULFATE (2.5 MG/3ML) 0.083% IN NEBU
2.5000 mg | INHALATION_SOLUTION | Freq: Four times a day (QID) | RESPIRATORY_TRACT | Status: DC | PRN
Start: 2014-04-14 — End: 2014-09-07

## 2014-04-14 NOTE — Telephone Encounter (Signed)
Erasmo Downer from [facility] called and wanted to verify if patient can take the diflucan 150 mg since it was a category D Rx.  I called Erasmo Downer and informed her that it was ok for her to prescribe.

## 2014-04-14 NOTE — Progress Notes (Unsigned)
Patient in today was at ED and diagnosed with Bronchitis and would like a nebulizer and inhaler. Patient given prescription for inhaler and nebulizer and sent to [facility].

## 2014-04-14 NOTE — ED Provider Notes (Signed)
Medical screening examination/treatment/procedure(s) were performed by non-physician practitioner and as supervising physician I was immediately available for consultation/collaboration.   EKG Interpretation None        Julianne Rice, MD 04/14/14 [phone]

## 2014-04-15 ENCOUNTER — Telehealth: Payer: Self-pay | Admitting: *Deleted

## 2014-04-15 ENCOUNTER — Other Ambulatory Visit: Payer: Medicaid Other

## 2014-04-15 LAB — POCT URINALYSIS DIP (DEVICE)
Bilirubin Urine: NEGATIVE
Glucose, UA: NEGATIVE mg/dL
LEUKOCYTES UA: NEGATIVE
Nitrite: NEGATIVE
Protein, ur: NEGATIVE mg/dL
Specific Gravity, Urine: >=1.03 (ref 1.005–1.030)
UROBILINOGEN UA: 1 mg/dL (ref 0.0–1.0)
pH: 5.5 (ref 5.0–8.0)

## 2014-04-15 NOTE — Telephone Encounter (Signed)
Pt left message stating that shethinks she may have a bladder infection. She would like to come in for a urine test. She will be getting off from work at 1100. I returned pt's call and informed her that she may come if she can be here by 1200.  She stated that she is 10 minutes away and will come now.

## 2014-04-19 ENCOUNTER — Ambulatory Visit (HOSPITAL_COMMUNITY)
Admission: RE | Admit: 2014-04-19 | Discharge: 2014-04-19 | Disposition: A | Discharge status: Home / Self Care | Payer: Medicaid Other | Source: Ambulatory Visit | Attending: Obstetrics & Gynecology | Admitting: Obstetrics & Gynecology

## 2014-04-19 ENCOUNTER — Encounter (HOSPITAL_COMMUNITY): Payer: Self-pay

## 2014-04-19 ENCOUNTER — Other Ambulatory Visit: Payer: Self-pay | Admitting: General Practice

## 2014-04-19 ENCOUNTER — Ambulatory Visit (HOSPITAL_COMMUNITY)
Admission: RE | Admit: 2014-04-19 | Discharge: 2014-04-19 | Disposition: A | Discharge status: Home / Self Care | Payer: Medicaid Other | Source: Ambulatory Visit | Attending: Advanced Practice Midwife | Admitting: Advanced Practice Midwife

## 2014-04-19 ENCOUNTER — Other Ambulatory Visit: Payer: Self-pay

## 2014-04-19 ENCOUNTER — Telehealth: Payer: Self-pay | Admitting: *Deleted

## 2014-04-19 VITALS — BP 136/80 | HR 77 | Wt 202.0 lb

## 2014-04-19 DIAGNOSIS — Z3682 Encounter for antenatal screening for nuchal translucency: Secondary | ICD-10-CM

## 2014-04-19 DIAGNOSIS — Z36 Encounter for antenatal screening of mother: Secondary | ICD-10-CM | POA: Insufficient documentation

## 2014-04-19 DIAGNOSIS — Z3401 Encounter for supervision of normal first pregnancy, first trimester: Secondary | ICD-10-CM

## 2014-04-19 NOTE — ED Notes (Signed)
Patient denies any bleeding over the last 3 weeks.  However, this am she awoke with small amount of bright red blood and some crampiness.  She now states it is more brownish in nature and crampiness has subsided.

## 2014-04-19 NOTE — Telephone Encounter (Signed)
Zahlia called and left a message that she had bleeding early in pregnancy and diagnosed with subchorionic hemorrhage.  States has been 3-4 weeks without bleeding and at her last appointment provider told her it was going away. States today she had bright red bleeding again.  Request a call.    Called [facility] and she states when she wipes she notes bright red bleeding that is sort of clotty- also having cramping.  Denies intercourse.  States is going to [facility] for ultrasound right now . Instructed her to tell them so they can check on ultrasound.  Go to [facility] if needed .

## 2014-04-20 ENCOUNTER — Telehealth: Payer: Self-pay | Admitting: *Deleted

## 2014-04-20 NOTE — Telephone Encounter (Signed)
Per chart review fhr observed 163. Called Kim Johns and she states they discussed her bleed and was told it should be ok - was small.  She states no more bright red bleeding - seems to be stopping- only dark red spotting now.  Discussed if future bright red bleeding to come to [facility] or call to be seen in office if during office hours.

## 2014-04-20 NOTE — Telephone Encounter (Signed)
Following up with Erma Heritage per her phone call yesterday reporting bright red vaginal bleeding. Per chart review saw [facility] received nuchal translucency ultrasound and ob ultrasound in [facility] yesterday- showing subchorionic hemorrhage still present but smaller in size.  Called [facility] to follow up and see if she was having anymore bleeding . Left message for her to call back.

## 2014-04-22 ENCOUNTER — Telehealth: Payer: Self-pay | Admitting: *Deleted

## 2014-04-22 ENCOUNTER — Ambulatory Visit (INDEPENDENT_AMBULATORY_CARE_PROVIDER_SITE_OTHER): Payer: Self-pay

## 2014-04-22 DIAGNOSIS — R8290 Unspecified abnormal findings in urine: Secondary | ICD-10-CM

## 2014-04-22 DIAGNOSIS — N390 Urinary tract infection, site not specified: Secondary | ICD-10-CM

## 2014-04-22 DIAGNOSIS — R82998 Other abnormal findings in urine: Secondary | ICD-10-CM

## 2014-04-22 LAB — POCT URINALYSIS DIP (DEVICE)
Bilirubin Urine: NEGATIVE
GLUCOSE, UA: NEGATIVE mg/dL
Ketones, ur: NEGATIVE mg/dL
Leukocytes, UA: NEGATIVE
NITRITE: NEGATIVE
Protein, ur: NEGATIVE mg/dL
Specific Gravity, Urine: 1.025 (ref 1.005–1.030)
Urobilinogen, UA: 0.2 mg/dL (ref 0.0–1.0)
pH: 6 (ref 5.0–8.0)

## 2014-04-22 NOTE — Telephone Encounter (Signed)
Called patient and she states she is having frequency with urination and burning.  Encouraged pt to come before 12 and provide a urine sample for testing.  Pt verbalizes understanding.

## 2014-04-22 NOTE — Progress Notes (Signed)
Pt c/o having pressure on her bladder and feeling like she is having to use the bathroom when she goes she does not have to go.  I informed pt that her UA showed a trace of blood and asked pt if she is having any spotting.  Pt informed me that she has continued to have spotting through out her pregnancy.  I informed pt that I would send her urine off for culture just in case and if anything needs to be treated then we will give her a call on Monday.  Pt agreed.

## 2014-04-22 NOTE — Telephone Encounter (Signed)
Pt called nurse line requesting call back states she is dehydrated and does not know what to do.

## 2014-04-24 LAB — CULTURE, OB URINE

## 2014-04-26 ENCOUNTER — Ambulatory Visit (INDEPENDENT_AMBULATORY_CARE_PROVIDER_SITE_OTHER): Payer: No Typology Code available for payment source | Admitting: Advanced Practice Midwife

## 2014-04-26 VITALS — BP 119/80 | HR 82 | Temp 98.6°F | Wt 201.8 lb

## 2014-04-26 DIAGNOSIS — Z3401 Encounter for supervision of normal first pregnancy, first trimester: Secondary | ICD-10-CM

## 2014-04-26 DIAGNOSIS — R519 Headache, unspecified: Secondary | ICD-10-CM

## 2014-04-26 DIAGNOSIS — R51 Headache: Secondary | ICD-10-CM

## 2014-04-26 DIAGNOSIS — Z3400 Encounter for supervision of normal first pregnancy, unspecified trimester: Secondary | ICD-10-CM

## 2014-04-26 LAB — POCT URINALYSIS DIP (DEVICE)
BILIRUBIN URINE: NEGATIVE
Glucose, UA: NEGATIVE mg/dL
Ketones, ur: NEGATIVE mg/dL
NITRITE: NEGATIVE
Protein, ur: NEGATIVE mg/dL
Specific Gravity, Urine: >=1.03 (ref 1.005–1.030)
Urobilinogen, UA: 0.2 mg/dL (ref 0.0–1.0)
pH: 5.5 (ref 5.0–8.0)

## 2014-04-26 MED ORDER — POLYETHYLENE GLYCOL 3350 17 G PO PACK: 17.0000 g | PACK | Freq: Every day | ORAL | Status: DC

## 2014-04-26 MED ORDER — BUTALBITAL-APAP-CAFFEINE 50-325-40 MG PO CAPS: 1.0000 | ORAL_CAPSULE | Freq: Four times a day (QID) | ORAL | Status: DC | PRN

## 2014-04-26 MED ORDER — PRENATAL VITAMINS 0.8 MG PO TABS: 1.0000 | ORAL_TABLET | Freq: Every day | ORAL | Status: DC

## 2014-04-26 NOTE — Patient Instructions (Signed)
Migraine Headache A migraine headache is an intense, throbbing pain on one or both sides of your head. A migraine can last for 30 minutes to several hours. CAUSES  The exact cause of a migraine headache is not always known. However, a migraine may be caused when nerves in the brain become irritated and release chemicals that cause inflammation. This causes pain. Certain things may also trigger migraines, such as:  Alcohol.  Smoking.  Stress.  Menstruation.  Aged cheeses.  Foods or drinks that contain nitrates, glutamate, aspartame, or tyramine.  Lack of sleep.  Chocolate.  Caffeine.  Hunger.  Physical exertion.  Fatigue.  Medicines used to treat chest pain (nitroglycerine), birth control pills, estrogen, and some blood pressure medicines. SIGNS AND SYMPTOMS  Pain on one or both sides of your head.  Pulsating or throbbing pain.  Severe pain that prevents daily activities.  Pain that is aggravated by any physical activity.  Nausea, vomiting, or both.  Dizziness.  Pain with exposure to bright lights, loud noises, or activity.  General sensitivity to bright lights, loud noises, or smells. Before you get a migraine, you may get warning signs that a migraine is coming (aura). An aura may include:  Seeing flashing lights.  Seeing bright spots, halos, or zig-zag lines.  Having tunnel vision or blurred vision.  Having feelings of numbness or tingling.  Having trouble talking.  Having muscle weakness. DIAGNOSIS  A migraine headache is often diagnosed based on:  Symptoms.  Physical exam.  A CT scan or MRI of your head. These imaging tests cannot diagnose migraines, but they can help rule out other causes of headaches. TREATMENT Medicines may be given for pain and nausea. Medicines can also be given to help prevent recurrent migraines.  HOME CARE INSTRUCTIONS  Only take over-the-counter or prescription medicines for pain or discomfort as directed by your  health care provider. The use of long-term narcotics is not recommended.  Lie down in a dark, quiet room when you have a migraine.  Keep a journal to find out what may trigger your migraine headaches. For example, write down:  What you eat and drink.  How much sleep you get.  Any change to your diet or medicines.  Limit alcohol consumption.  Quit smoking if you smoke.  Get 7 9 hours of sleep, or as recommended by your health care provider.  Limit stress.  Keep lights dim if bright lights bother you and make your migraines worse. SEEK IMMEDIATE MEDICAL CARE IF:   Your migraine becomes severe.  You have a fever.  You have a stiff neck.  You have vision loss.  You have muscular weakness or loss of muscle control.  You start losing your balance or have trouble walking.  You feel faint or pass out.  You have severe symptoms that are different from your first symptoms. MAKE SURE YOU:   Understand these instructions.  Will watch your condition.  Will get help right away if you are not doing well or get worse. Document Released: 11/11/2005 Document Revised: 09/01/2013 Document Reviewed: 07/19/2013 [employer] Patient Information 2014 [employer].  

## 2014-04-26 NOTE — Progress Notes (Signed)
C/o of constant, non-stop headache for the past two weeks.

## 2014-04-26 NOTE — Progress Notes (Signed)
Doing well. Wants PNV rx as well as a laxative.  Has had a prolonged headache. Sounds a little like tension, unrelieved by Tylenol. Has Rx flexeril at home. Advised to try that first. Rx fioricet given in case Flexeril does not work.

## 2014-05-24 ENCOUNTER — Ambulatory Visit (INDEPENDENT_AMBULATORY_CARE_PROVIDER_SITE_OTHER): Payer: Self-pay | Admitting: Obstetrics and Gynecology

## 2014-05-24 ENCOUNTER — Encounter: Payer: Self-pay | Admitting: Obstetrics and Gynecology

## 2014-05-24 VITALS — BP 131/76 | HR 81 | Temp 98.6°F | Wt 203.6 lb

## 2014-05-24 DIAGNOSIS — R82998 Other abnormal findings in urine: Secondary | ICD-10-CM

## 2014-05-24 DIAGNOSIS — Z3402 Encounter for supervision of normal first pregnancy, second trimester: Secondary | ICD-10-CM

## 2014-05-24 DIAGNOSIS — Z3400 Encounter for supervision of normal first pregnancy, unspecified trimester: Secondary | ICD-10-CM

## 2014-05-24 DIAGNOSIS — R8290 Unspecified abnormal findings in urine: Secondary | ICD-10-CM

## 2014-05-24 DIAGNOSIS — O200 Threatened abortion: Secondary | ICD-10-CM

## 2014-05-24 LAB — POCT URINALYSIS DIP (DEVICE)
Bilirubin Urine: NEGATIVE
Glucose, UA: NEGATIVE mg/dL
Hgb urine dipstick: NEGATIVE
KETONES UR: 15 mg/dL — AB
Nitrite: NEGATIVE
PH: 6.5 (ref 5.0–8.0)
Protein, ur: NEGATIVE mg/dL
Specific Gravity, Urine: 1.025 (ref 1.005–1.030)
Urobilinogen, UA: 0.2 mg/dL (ref 0.0–1.0)

## 2014-05-24 NOTE — Patient Instructions (Signed)
Second Trimester of Pregnancy The second trimester is from week 13 through week 28, months 4 through 6. The second trimester is often a time when you feel your best. Your body has also adjusted to being pregnant, and you begin to feel better physically. Usually, morning sickness has lessened or quit completely, you may have more energy, and you may have an increase in appetite. The second trimester is also a time when the fetus is growing rapidly. At the end of the sixth month, the fetus is about 9 inches long and weighs about 1 pounds. You will likely begin to feel the baby move (quickening) between 18 and 20 weeks of the pregnancy. BODY CHANGES Your body goes through many changes during pregnancy. The changes vary from woman to woman.   Your weight will continue to increase. You will notice your lower abdomen bulging out.  You may begin to get stretch marks on your hips, abdomen, and breasts.  You may develop headaches that can be relieved by medicines approved by your health care provider.  You may urinate more often because the fetus is pressing on your bladder.  You may develop or continue to have heartburn as a result of your pregnancy.  You may develop constipation because certain hormones are causing the muscles that push waste through your intestines to slow down.  You may develop hemorrhoids or swollen, bulging veins (varicose veins).  You may have back pain because of the weight gain and pregnancy hormones relaxing your joints between the bones in your pelvis and as a result of a shift in weight and the muscles that support your balance.  Your breasts will continue to grow and be tender.  Your gums may bleed and may be sensitive to brushing and flossing.  Dark spots or blotches (chloasma, mask of pregnancy) may develop on your face. This will likely fade after the baby is born.  A dark line from your belly button to the pubic area (linea nigra) may appear. This will likely fade  after the baby is born.  You may have changes in your hair. These can include thickening of your hair, rapid growth, and changes in texture. Some women also have hair loss during or after pregnancy, or hair that feels dry or thin. Your hair will most likely return to normal after your baby is born. WHAT TO EXPECT AT YOUR PRENATAL VISITS During a routine prenatal visit:  You will be weighed to make sure you and the fetus are growing normally.  Your blood pressure will be taken.  Your abdomen will be measured to track your baby's growth.  The fetal heartbeat will be listened to.  Any test results from the previous visit will be discussed. Your health care provider may ask you:  How you are feeling.  If you are feeling the baby move.  If you have had any abnormal symptoms, such as leaking fluid, bleeding, severe headaches, or abdominal cramping.  If you have any questions. Other tests that may be performed during your second trimester include:  Blood tests that check for:  Low iron levels (anemia).  Gestational diabetes (between 24 and 28 weeks).  Rh antibodies.  Urine tests to check for infections, diabetes, or protein in the urine.  An ultrasound to confirm the proper growth and development of the baby.  An amniocentesis to check for possible genetic problems.  Fetal screens for spina bifida and Down syndrome. HOME CARE INSTRUCTIONS   Avoid all smoking, herbs, alcohol, and unprescribed   drugs. These chemicals affect the formation and growth of the baby.  Follow your health care provider's instructions regarding medicine use. There are medicines that are either safe or unsafe to take during pregnancy.  Exercise only as directed by your health care provider. Experiencing uterine cramps is a good sign to stop exercising.  Continue to eat regular, healthy meals.  Wear a good support bra for breast tenderness.  Do not use hot tubs, steam rooms, or saunas.  Wear your  seat belt at all times when driving.  Avoid raw meat, uncooked cheese, cat litter boxes, and soil used by cats. These carry germs that can cause birth defects in the baby.  Take your prenatal vitamins.  Try taking a stool softener (if your health care provider approves) if you develop constipation. Eat more high-fiber foods, such as fresh vegetables or fruit and whole grains. Drink plenty of fluids to keep your urine clear or pale yellow.  Take warm sitz baths to soothe any pain or discomfort caused by hemorrhoids. Use hemorrhoid cream if your health care provider approves.  If you develop varicose veins, wear support hose. Elevate your feet for 15 minutes, 3-4 times a day. Limit salt in your diet.  Avoid heavy lifting, wear low heel shoes, and practice good posture.  Rest with your legs elevated if you have leg cramps or low back pain.  Visit your dentist if you have not gone yet during your pregnancy. Use a soft toothbrush to brush your teeth and be gentle when you floss.  A sexual relationship may be continued unless your health care provider directs you otherwise.  Continue to go to all your prenatal visits as directed by your health care provider. SEEK MEDICAL CARE IF:   You have dizziness.  You have mild pelvic cramps, pelvic pressure, or nagging pain in the abdominal area.  You have persistent nausea, vomiting, or diarrhea.  You have a bad smelling vaginal discharge.  You have pain with urination. SEEK IMMEDIATE MEDICAL CARE IF:   You have a fever.  You are leaking fluid from your vagina.  You have spotting or bleeding from your vagina.  You have severe abdominal cramping or pain.  You have rapid weight gain or loss.  You have shortness of breath with chest pain.  You notice sudden or extreme swelling of your face, hands, ankles, feet, or legs.  You have not felt your baby move in over an hour.  You have severe headaches that do not go away with  medicine.  You have vision changes. Document Released: 11/05/2001 Document Revised: 11/16/2013 Document Reviewed: 01/12/2013 ExitCare Patient Information 2015 [employer]. This information is not intended to replace advice given to you by your health care provider. Make sure you discuss any questions you have with your health care provider.  

## 2014-05-24 NOTE — Progress Notes (Signed)
Doing well. LLQ discomfort positional, at times worse with urination. Tr LE. Denies dysuria, urgency, frequency>C&S sent.  1st screen nl>AFP today. Scheduled anatomy US.

## 2014-05-24 NOTE — Progress Notes (Signed)
Pt reports having consistent pain in LLQ

## 2014-05-25 LAB — ALPHA FETOPROTEIN, MATERNAL
AFP: 32.4 IU/mL
Curr Gest Age: 21.6 wks.days
MOM FOR AFP: 0.57
Open Spina bifida: NEGATIVE
Osb Risk: <1:54600 {titer}

## 2014-05-26 ENCOUNTER — Telehealth: Payer: Self-pay | Admitting: *Deleted

## 2014-05-26 DIAGNOSIS — O2342 Unspecified infection of urinary tract in pregnancy, second trimester: Secondary | ICD-10-CM

## 2014-05-26 NOTE — Telephone Encounter (Signed)
Patient called to request the results of her urine culture. She is still having symptoms.

## 2014-05-26 NOTE — Telephone Encounter (Signed)
Called patient, no answer- left message that we are trying to return your phone call, the results you are requesting are not back yet but she may call us on Monday to see if they are back yet as we will be closed tomorrow.

## 2014-05-27 ENCOUNTER — Inpatient Hospital Stay (HOSPITAL_COMMUNITY)
Admission: AD | Admit: 2014-05-27 | Discharge: 2014-05-27 | Disposition: A | Discharge status: Home / Self Care | Payer: Self-pay | Source: Ambulatory Visit | Attending: Obstetrics and Gynecology | Admitting: Obstetrics and Gynecology

## 2014-05-27 ENCOUNTER — Encounter (HOSPITAL_COMMUNITY): Payer: Self-pay | Admitting: *Deleted

## 2014-05-27 DIAGNOSIS — O2390 Unspecified genitourinary tract infection in pregnancy, unspecified trimester: Secondary | ICD-10-CM | POA: Insufficient documentation

## 2014-05-27 DIAGNOSIS — Z87891 Personal history of nicotine dependence: Secondary | ICD-10-CM | POA: Insufficient documentation

## 2014-05-27 DIAGNOSIS — R1032 Left lower quadrant pain: Secondary | ICD-10-CM | POA: Insufficient documentation

## 2014-05-27 DIAGNOSIS — O2342 Unspecified infection of urinary tract in pregnancy, second trimester: Secondary | ICD-10-CM

## 2014-05-27 DIAGNOSIS — N390 Urinary tract infection, site not specified: Secondary | ICD-10-CM | POA: Insufficient documentation

## 2014-05-27 LAB — URINALYSIS, ROUTINE W REFLEX MICROSCOPIC
BILIRUBIN URINE: NEGATIVE
Glucose, UA: NEGATIVE mg/dL
Hgb urine dipstick: NEGATIVE
KETONES UR: NEGATIVE mg/dL
Leukocytes, UA: NEGATIVE
NITRITE: NEGATIVE
PROTEIN: NEGATIVE mg/dL
Specific Gravity, Urine: 1.01 (ref 1.005–1.030)
UROBILINOGEN UA: 0.2 mg/dL (ref 0.0–1.0)
pH: 6 (ref 5.0–8.0)

## 2014-05-27 MED ORDER — PHENAZOPYRIDINE HCL 100 MG PO TABS
200.0000 mg | ORAL_TABLET | ORAL | Status: AC
  Administered 2014-05-27: 200 mg via ORAL
  Filled 2014-05-27: qty 2

## 2014-05-27 MED ORDER — NITROFURANTOIN MONOHYD MACRO 100 MG PO CAPS: 100.0000 mg | ORAL_CAPSULE | Freq: Two times a day (BID) | ORAL | Status: DC

## 2014-05-27 MED ORDER — PHENAZOPYRIDINE HCL 200 MG PO TABS: 200.0000 mg | ORAL_TABLET | Freq: Three times a day (TID) | ORAL | Status: DC | PRN

## 2014-05-27 MED ORDER — NITROFURANTOIN MONOHYD MACRO 100 MG PO CAPS
100.0000 mg | ORAL_CAPSULE | ORAL | Status: AC
  Administered 2014-05-27: 100 mg via ORAL
  Filled 2014-05-27: qty 1

## 2014-05-27 NOTE — MAU Provider Note (Signed)
Chief Complaint: Abdominal Pain   First Provider Initiated Contact with Patient 05/27/14 [phone]     SUBJECTIVE HPI: Kim Johns is a 23 y.o. G1P0 at [redacted]w[redacted]d by LMP who presents to maternity admissions reporting urinary frequency/urgency and left lower abdominal pain when coughing or moving.  She reports the abdominal pain was so bad yesterday that she missed work.  She was seen in clinic on Tuesday and had a urine culture but she does not know the results.  She is drinking lots of water but also orange juice and small amount of coffee daily.  She denies LOF, vaginal bleeding, vaginal itching/burning, urinary symptoms, h/a, dizziness, n/v, or fever/chills.   Past Medical History  Diagnosis Date  . Allergy   . Asthma    Past Surgical History  Procedure Laterality Date  . No past surgeries     History   Social History  . Marital Status: Single    Spouse Name: N/A    Number of Children: N/A  . Years of Education: N/A   Occupational History  . Not on file.   Social History Main Topics  . Smoking status: Former Smoker -- 0.50 packs/day for 10 years    Types: Cigarettes    Quit date: 02/22/2014  . Smokeless tobacco: Not on file  . Alcohol Use: No  . Drug Use: No  . Sexual Activity: Not on file   Other Topics Concern  . Not on file   Social History Narrative  . No narrative on file   No current facility-administered medications on file prior to encounter.   Current Outpatient Prescriptions on File Prior to Encounter  Medication Sig Dispense Refill  . albuterol (PROVENTIL HFA;VENTOLIN HFA) 108 (90 BASE) MCG/ACT inhaler Inhale 2 puffs into the lungs every 6 (six) hours as needed for wheezing or shortness of breath.  1 Inhaler  2  . albuterol (PROVENTIL) (2.5 MG/3ML) 0.083% nebulizer solution Take 3 mLs (2.5 mg total) by nebulization every 6 (six) hours as needed for wheezing or shortness of breath.  150 mL  1   Allergies  Allergen Reactions  . Shellfish Allergy Hives  . Sulfa  Antibiotics Other (See Comments)    Had since childhood not sure of reaction  . Icy Hot Rash    ROS: Pertinent items in HPI  OBJECTIVE Blood pressure 150/61, pulse 100, temperature 98.2 F (36.8 C), temperature source Oral, last menstrual period 01/26/2014. GENERAL: Well-developed, well-nourished female in no acute distress.  HEENT: Normocephalic HEART: normal rate RESP: normal effort ABDOMEN: Soft, mild tenderness in left lower quadrant, left inguinal area Musculoskeletal: Negative CVA tenderness EXTREMITIES: Nontender, no edema NEURO: Alert and oriented  FHT 147 by doppler  LAB RESULTS Results for orders placed during the hospital encounter of 05/27/14 (from the past 24 hour(s))  URINALYSIS, ROUTINE W REFLEX MICROSCOPIC     Status: None   Collection Time    05/27/14  8:35 AM      Result Value Ref Range   Color, Urine YELLOW  YELLOW   APPearance CLEAR  CLEAR   Specific Gravity, Urine 1.010  1.005 - 1.030   pH 6.0  5.0 - 8.0   Glucose, UA NEGATIVE  NEGATIVE mg/dL   Hgb urine dipstick NEGATIVE  NEGATIVE   Bilirubin Urine NEGATIVE  NEGATIVE   Ketones, ur NEGATIVE  NEGATIVE mg/dL   Protein, ur NEGATIVE  NEGATIVE mg/dL   Urobilinogen, UA 0.2  0.0 - 1.0 mg/dL   Nitrite NEGATIVE  NEGATIVE   Leukocytes, UA  NEGATIVE  NEGATIVE   Urine culture on 05/24/14 with 50,000 Ecoli.   ASSESSMENT 1. UTI in pregnancy, antepartum, second trimester     PLAN Discharge home Treat for symptomatic UTI  Increase PO water, decrease juice and caffeine Macrobid BID x 7 days Pyridium TID PRN  Follow-up Information   Follow up with [facility]. (As scheduled)    Specialty:  Obstetrics and Gynecology   Contact information:   [place] [address] [phone]      Follow up with [facility]. (As needed for emergencies)    Contact information:   [address] [id number] [place]  [id number] [phone]      Fatima Blank Certified Nurse-Midwife 05/27/2014  10:15 AM

## 2014-05-27 NOTE — MAU Provider Note (Signed)
Attestation of Attending Supervision of Advanced Practitioner (CNM/NP): Evaluation and management procedures were performed by the Advanced Practitioner under my supervision and collaboration.  I have reviewed the Advanced Practitioner's note and chart, and I agree with the management and plan.  Jawan Chavarria 05/27/2014 10:25 AM

## 2014-05-27 NOTE — Discharge Instructions (Signed)
Pregnancy and Urinary Tract Infection °A urinary tract infection (UTI) is a bacterial infection of the urinary tract. Infection of the urinary tract can include the ureters, kidneys (pyelonephritis), bladder (cystitis), and urethra (urethritis). All pregnant women should be screened for bacteria in the urinary tract. Identifying and treating a UTI will decrease the risk of preterm labor and developing more serious infections in both the mother and baby. °CAUSES °Bacteria germs cause almost all UTIs.  °RISK FACTORS °Many factors can increase your chances of getting a UTI during pregnancy. These include: °· Having a short urethra. °· Poor toilet and hygiene habits. °· Sexual intercourse. °· Blockage of urine along the urinary tract. °· Problems with the pelvic muscles or nerves. °· Diabetes. °· Obesity. °· Bladder problems after having several children. °· Previous history of UTI. °SIGNS AND SYMPTOMS  °· Pain, burning, or a stinging feeling when urinating. °· Suddenly feeling the need to urinate right away (urgency). °· Loss of bladder control (urinary incontinence). °· Frequent urination, more than is common with pregnancy. °· Lower abdominal or back discomfort. °· Cloudy urine. °· Blood in the urine (hematuria). °· Fever.  °When the kidneys are infected, the symptoms may be: °· Back pain. °· Flank pain on the right side more so than the left. °· Fever. °· Chills. °· Nausea. °· Vomiting. °DIAGNOSIS  °A urinary tract infection is usually diagnosed through urine tests. Additional tests and procedures are sometimes done. These may include: °· Ultrasound exam of the kidneys, ureters, bladder, and urethra. °· Looking in the bladder with a lighted tube (cystoscopy). °TREATMENT °Typically, UTIs can be treated with antibiotic medicines.  °HOME CARE INSTRUCTIONS  °· Only take over-the-counter or prescription medicines as directed by your health care provider. If you were prescribed antibiotics, take them as directed. Finish  them even if you start to feel better. °· Drink enough fluids to keep your urine clear or pale yellow. °· Do not have sexual intercourse until the infection is gone and your health care provider says it is okay. °· Make sure you are tested for UTIs throughout your pregnancy. These infections often come back.  °Preventing a UTI in the Future °· Practice good toilet habits. Always wipe from front to back. Use the tissue only once. °· Do not hold your urine. Empty your bladder as soon as possible when the urge comes. °· Do not douche or use deodorant sprays. °· Wash with soap and warm water around the genital area and the anus. °· Empty your bladder before and after sexual intercourse. °· Wear underwear with a cotton crotch. °· Avoid caffeine and carbonated drinks. They can irritate the bladder. °· Drink cranberry juice or take cranberry pills. This may decrease the risk of getting a UTI. °· Do not drink alcohol. °· Keep all your appointments and tests as scheduled.  °SEEK MEDICAL CARE IF:  °· Your symptoms get worse. °· You are still having fevers 2 or more days after treatment begins. °· You have a rash. °· You feel that you are having problems with medicines prescribed. °· You have abnormal vaginal discharge. °SEEK IMMEDIATE MEDICAL CARE IF:  °· You have back or flank pain. °· You have chills. °· You have blood in your urine. °· You have nausea and vomiting. °· You have contractions of your uterus. °· You have a gush of fluid from the vagina. °MAKE SURE YOU: °· Understand these instructions.   °· Will watch your condition.   °· Will get help right away if you are not doing   well or get worse.   °Document Released: 03/08/2011 Document Revised: 09/01/2013 Document Reviewed: 06/10/2013 °ExitCare® Patient Information ©2015 [employer]. This information is not intended to replace advice given to you by your health care provider. Make sure you discuss any questions you have with your health care provider. ° °

## 2014-05-27 NOTE — MAU Note (Signed)
Pt presents to MAU with complaints of pain in the left lower part of her abdomen. Pt was evaluated here at the clinic on Tuesday for symptoms of a UTI, states she was not treated, she continues to have urine frequency.

## 2014-05-28 LAB — CULTURE, OB URINE

## 2014-05-30 ENCOUNTER — Telehealth: Payer: Self-pay | Admitting: *Deleted

## 2014-05-30 MED ORDER — AMOXICILLIN 500 MG PO CAPS: 500.0000 mg | ORAL_CAPSULE | Freq: Three times a day (TID) | ORAL | Status: DC

## 2014-05-30 NOTE — Telephone Encounter (Signed)
After talking with Kim Johns earlier- saw that she had went to MAU over the weekend for uti and they treated her prophylactically with macrobid and pytidium. Discussed with Dr. Harolyn Rutherford - if patient has started macrobid and pyridium does not need amoxicillin- if she has not , then tell her only get amoxicillin filled and take that.  Called Kim Johns and she states she has not gotten any of the meds filled- we discussed amoxicillin would be the best to treat the uti and she should only get that one filled. She voices understanding

## 2014-05-30 NOTE — Telephone Encounter (Signed)
Reviewed culture results- + for E. Coli.reviewed chart no orders found. Reviewed culture results with Dr. Harolyn Rutherford- orders for amoxicillin given. Called patient and gave her results and informed her needs to take amoxicillin for uti. [place]ienna voices understanding.

## 2014-05-31 ENCOUNTER — Telehealth: Payer: Self-pay | Admitting: *Deleted

## 2014-05-31 ENCOUNTER — Telehealth: Payer: Self-pay

## 2014-05-31 MED ORDER — CEPHALEXIN 500 MG PO CAPS: 500.0000 mg | ORAL_CAPSULE | Freq: Four times a day (QID) | ORAL | Status: DC

## 2014-05-31 NOTE — Telephone Encounter (Addendum)
Message copied by Langston Reusing on Tue May 31, 2014  8:26 AM ------      Message from: POE, DEIRDRE C      Created: Mon May 30, 2014  5:43 PM       Keflex 500 qid x 7d  ------ Called pt and left message that I am calling with test results. Please call back and state whether a detailed message can be left on your voice mail.  *Rx has been sent to pharmacy    1600  Spoke w/pt and informed her of need for antibiotic to treat a bladder infection.  Pt states she was contacted yesterday and was given Rx for Amoxicillin which she is now taking. I acknowledged pt's information and stated that the keflex rx will not be necessary. Pt voiced understanding. I notified pharmacy to D/C the keflex rx.

## 2014-05-31 NOTE — Telephone Encounter (Signed)
Pt. Called requesting results and states it is OK to leave detailed message on voicemail. Per chart review patient has already spoke to [place], Therapist, sports and informed. No further action necessary.

## 2014-06-06 ENCOUNTER — Other Ambulatory Visit: Payer: Self-pay | Admitting: General Practice

## 2014-06-06 ENCOUNTER — Ambulatory Visit (HOSPITAL_COMMUNITY)
Admission: RE | Admit: 2014-06-06 | Discharge: 2014-06-06 | Disposition: A | Discharge status: Home / Self Care | Payer: Medicaid Other | Source: Ambulatory Visit | Attending: Obstetrics & Gynecology | Admitting: Obstetrics & Gynecology

## 2014-06-06 ENCOUNTER — Telehealth: Payer: Self-pay | Admitting: *Deleted

## 2014-06-06 DIAGNOSIS — Z3689 Encounter for other specified antenatal screening: Secondary | ICD-10-CM | POA: Insufficient documentation

## 2014-06-06 DIAGNOSIS — B3731 Acute candidiasis of vulva and vagina: Secondary | ICD-10-CM

## 2014-06-06 DIAGNOSIS — B373 Candidiasis of vulva and vagina: Secondary | ICD-10-CM

## 2014-06-06 DIAGNOSIS — Z3490 Encounter for supervision of normal pregnancy, unspecified, unspecified trimester: Secondary | ICD-10-CM

## 2014-06-06 MED ORDER — FLUCONAZOLE 150 MG PO TABS: 150.0000 mg | ORAL_TABLET | Freq: Once | ORAL | Status: DC

## 2014-06-06 NOTE — Telephone Encounter (Addendum)
Patient states that she took amoxicillin recently and now has a yeast infection. Wants an appointment or some medicine called in. Spoke with patient, rx sent to pharmacy per protocol.

## 2014-06-09 ENCOUNTER — Telehealth: Payer: Self-pay

## 2014-06-09 NOTE — Telephone Encounter (Signed)
Patient returned call. Patient states she took first pill Monday and second pill yesterday and explains that when she took a shower yesterday and cleaned with soap vaginal area burned. Patient reports experiencing vaginal itching and some outside irritation. Explained to patient that it can take up to 3 days to start experiencing relief. Advised patient to use OTC monistat cream for outside irritation and to see if symptoms get better in two days. Advised patient to call if symptoms still do not resolve. Patient verbalized understanding and gratitude. No further questions or concerns.

## 2014-06-09 NOTE — Telephone Encounter (Signed)
Attempted to call patient. No answer. Left message stating we are returning your call, please call clinic.

## 2014-06-09 NOTE — Telephone Encounter (Signed)
Patient called stating she recently took ABX and developed yeast infection-- took one diflucan and then two days later took another-- states symptoms are unchanged and seem to be getting worse. Would like to know what to do or see if she can have an appointment.

## 2014-06-17 ENCOUNTER — Encounter (HOSPITAL_COMMUNITY): Payer: Self-pay | Admitting: *Deleted

## 2014-06-17 ENCOUNTER — Inpatient Hospital Stay (HOSPITAL_COMMUNITY): Payer: Medicaid Other

## 2014-06-17 ENCOUNTER — Inpatient Hospital Stay (HOSPITAL_COMMUNITY)
Admission: AD | Admit: 2014-06-17 | Discharge: 2014-06-18 | DRG: 779 | Disposition: A | Discharge status: Home / Self Care | Payer: Medicaid Other | Source: Ambulatory Visit | Attending: Obstetrics & Gynecology | Admitting: Obstetrics & Gynecology

## 2014-06-17 DIAGNOSIS — Z8249 Family history of ischemic heart disease and other diseases of the circulatory system: Secondary | ICD-10-CM

## 2014-06-17 DIAGNOSIS — Z833 Family history of diabetes mellitus: Secondary | ICD-10-CM

## 2014-06-17 DIAGNOSIS — Z823 Family history of stroke: Secondary | ICD-10-CM | POA: Diagnosis not present

## 2014-06-17 DIAGNOSIS — IMO0002 Reserved for concepts with insufficient information to code with codable children: Secondary | ICD-10-CM | POA: Diagnosis present

## 2014-06-17 DIAGNOSIS — O021 Missed abortion: Secondary | ICD-10-CM

## 2014-06-17 DIAGNOSIS — Z87891 Personal history of nicotine dependence: Secondary | ICD-10-CM

## 2014-06-17 DIAGNOSIS — O42912 Preterm premature rupture of membranes, unspecified as to length of time between rupture and onset of labor, second trimester: Secondary | ICD-10-CM

## 2014-06-17 DIAGNOSIS — O4290 Premature rupture of membranes, unspecified as to length of time between rupture and onset of labor, unspecified weeks of gestation: Secondary | ICD-10-CM

## 2014-06-17 DIAGNOSIS — O09293 Supervision of pregnancy with other poor reproductive or obstetric history, third trimester: Secondary | ICD-10-CM | POA: Diagnosis present

## 2014-06-17 LAB — CBC
HCT: 34.6 % — ABNORMAL LOW (ref 36.0–46.0)
Hemoglobin: 12.5 g/dL (ref 12.0–15.0)
MCH: 33.9 pg (ref 26.0–34.0)
MCHC: 36.1 g/dL — ABNORMAL HIGH (ref 30.0–36.0)
MCV: 93.8 fL (ref 78.0–100.0)
Platelets: 225 10*3/uL (ref 150–400)
RBC: 3.69 MIL/uL — AB (ref 3.87–5.11)
RDW: 12.7 % (ref 11.5–15.5)
WBC: 10.8 10*3/uL — AB (ref 4.0–10.5)

## 2014-06-17 LAB — ABO/RH: ABO/RH(D): O POS

## 2014-06-17 LAB — WET PREP, GENITAL
CLUE CELLS WET PREP: NONE SEEN
Trich, Wet Prep: NONE SEEN
Yeast Wet Prep HPF POC: NONE SEEN

## 2014-06-17 LAB — GROUP B STREP BY PCR: Group B strep by PCR: POSITIVE — AB

## 2014-06-17 LAB — URINE MICROSCOPIC-ADD ON

## 2014-06-17 LAB — RAPID URINE DRUG SCREEN, HOSP PERFORMED
Amphetamines: NOT DETECTED
BARBITURATES: NOT DETECTED
Benzodiazepines: NOT DETECTED
Cocaine: NOT DETECTED
OPIATES: NOT DETECTED
TETRAHYDROCANNABINOL: NOT DETECTED

## 2014-06-17 LAB — URINALYSIS, ROUTINE W REFLEX MICROSCOPIC
Bilirubin Urine: NEGATIVE
Glucose, UA: 100 mg/dL — AB
KETONES UR: NEGATIVE mg/dL
Leukocytes, UA: NEGATIVE
Nitrite: NEGATIVE
PROTEIN: NEGATIVE mg/dL
Specific Gravity, Urine: 1.02 (ref 1.005–1.030)
UROBILINOGEN UA: 0.2 mg/dL (ref 0.0–1.0)
pH: 6 (ref 5.0–8.0)

## 2014-06-17 LAB — TYPE AND SCREEN
ABO/RH(D): O POS
ANTIBODY SCREEN: NEGATIVE

## 2014-06-17 LAB — OB RESULTS CONSOLE GBS: GBS: POSITIVE

## 2014-06-17 MED ORDER — OXYCODONE-ACETAMINOPHEN 5-325 MG PO TABS: 1.0000 | ORAL_TABLET | ORAL | Status: DC | PRN

## 2014-06-17 MED ORDER — BUTORPHANOL TARTRATE 1 MG/ML IJ SOLN
2.0000 mg | INTRAMUSCULAR | Status: DC | PRN
  Administered 2014-06-17: 2 mg via INTRAVENOUS
  Filled 2014-06-17: qty 2

## 2014-06-17 MED ORDER — ALBUTEROL SULFATE HFA 108 (90 BASE) MCG/ACT IN AERS: 2.0000 | INHALATION_SPRAY | Freq: Four times a day (QID) | RESPIRATORY_TRACT | Status: DC | PRN

## 2014-06-17 MED ORDER — LACTATED RINGERS IV SOLN
INTRAVENOUS | Status: DC
  Administered 2014-06-17: 12:00:00 via INTRAVENOUS

## 2014-06-17 MED ORDER — DIBUCAINE 1 % RE OINT: 1.0000 "application " | TOPICAL_OINTMENT | RECTAL | Status: DC | PRN

## 2014-06-17 MED ORDER — LIDOCAINE HCL (PF) 1 % IJ SOLN
30.0000 mL | INTRAMUSCULAR | Status: DC | PRN
  Filled 2014-06-17: qty 30

## 2014-06-17 MED ORDER — EPHEDRINE 5 MG/ML INJ
10.0000 mg | INTRAVENOUS | Status: DC | PRN
  Filled 2014-06-17: qty 2

## 2014-06-17 MED ORDER — SENNOSIDES-DOCUSATE SODIUM 8.6-50 MG PO TABS
2.0000 | ORAL_TABLET | ORAL | Status: DC
  Filled 2014-06-17: qty 2

## 2014-06-17 MED ORDER — PROMETHAZINE HCL 25 MG/ML IJ SOLN
25.0000 mg | Freq: Three times a day (TID) | INTRAMUSCULAR | Status: DC | PRN
  Administered 2014-06-17: 25 mg via INTRAVENOUS
  Filled 2014-06-17: qty 1

## 2014-06-17 MED ORDER — ACETAMINOPHEN 325 MG PO TABS: 650.0000 mg | ORAL_TABLET | ORAL | Status: DC | PRN

## 2014-06-17 MED ORDER — LACTATED RINGERS IV SOLN: 500.0000 mL | INTRAVENOUS | Status: DC | PRN

## 2014-06-17 MED ORDER — OXYTOCIN BOLUS FROM INFUSION
500.0000 mL | INTRAVENOUS | Status: DC
Start: 2014-06-17 — End: 2014-06-17

## 2014-06-17 MED ORDER — TETANUS-DIPHTH-ACELL PERTUSSIS 5-2.5-18.5 LF-MCG/0.5 IM SUSP: 0.5000 mL | Freq: Once | INTRAMUSCULAR | Status: DC

## 2014-06-17 MED ORDER — DIPHENHYDRAMINE HCL 50 MG/ML IJ SOLN: 12.5000 mg | INTRAMUSCULAR | Status: DC | PRN

## 2014-06-17 MED ORDER — SIMETHICONE 80 MG PO CHEW
80.0000 mg | CHEWABLE_TABLET | ORAL | Status: DC | PRN
  Filled 2014-06-17: qty 1

## 2014-06-17 MED ORDER — PHENYLEPHRINE 40 MCG/ML (10ML) SYRINGE FOR IV PUSH (FOR BLOOD PRESSURE SUPPORT)
80.0000 ug | PREFILLED_SYRINGE | INTRAVENOUS | Status: DC | PRN
  Filled 2014-06-17: qty 2

## 2014-06-17 MED ORDER — ONDANSETRON HCL 4 MG/2ML IJ SOLN: 4.0000 mg | Freq: Four times a day (QID) | INTRAMUSCULAR | Status: DC | PRN

## 2014-06-17 MED ORDER — PRENATAL MULTIVITAMIN CH: 1.0000 | ORAL_TABLET | Freq: Every day | ORAL | Status: DC

## 2014-06-17 MED ORDER — PHENYLEPHRINE 40 MCG/ML (10ML) SYRINGE FOR IV PUSH (FOR BLOOD PRESSURE SUPPORT)
80.0000 ug | PREFILLED_SYRINGE | INTRAVENOUS | Status: DC | PRN
  Filled 2014-06-17: qty 10
  Filled 2014-06-17: qty 2

## 2014-06-17 MED ORDER — ZOLPIDEM TARTRATE 5 MG PO TABS: 5.0000 mg | ORAL_TABLET | Freq: Every evening | ORAL | Status: DC | PRN

## 2014-06-17 MED ORDER — BENZOCAINE-MENTHOL 20-0.5 % EX AERO: 1.0000 "application " | INHALATION_SPRAY | CUTANEOUS | Status: DC | PRN

## 2014-06-17 MED ORDER — FENTANYL CITRATE 0.05 MG/ML IJ SOLN
100.0000 ug | INTRAMUSCULAR | Status: DC | PRN
  Administered 2014-06-17 (×2): 100 ug via INTRAVENOUS
  Filled 2014-06-17 (×2): qty 2

## 2014-06-17 MED ORDER — DOCUSATE SODIUM 100 MG PO CAPS
100.0000 mg | ORAL_CAPSULE | Freq: Every day | ORAL | Status: DC
  Filled 2014-06-17: qty 1

## 2014-06-17 MED ORDER — ONDANSETRON HCL 4 MG/2ML IJ SOLN: 4.0000 mg | INTRAMUSCULAR | Status: DC | PRN

## 2014-06-17 MED ORDER — OXYTOCIN 40 UNITS IN LACTATED RINGERS INFUSION - SIMPLE MED
62.5000 mL/h | INTRAVENOUS | Status: DC
  Filled 2014-06-17: qty 1000

## 2014-06-17 MED ORDER — FLEET ENEMA 7-19 GM/118ML RE ENEM: 1.0000 | ENEMA | RECTAL | Status: DC | PRN

## 2014-06-17 MED ORDER — WITCH HAZEL-GLYCERIN EX PADS: 1.0000 "application " | MEDICATED_PAD | CUTANEOUS | Status: DC | PRN

## 2014-06-17 MED ORDER — IBUPROFEN 600 MG PO TABS
600.0000 mg | ORAL_TABLET | Freq: Four times a day (QID) | ORAL | Status: DC
  Administered 2014-06-17 – 2014-06-18 (×2): 600 mg via ORAL
  Filled 2014-06-17 (×2): qty 1

## 2014-06-17 MED ORDER — IBUPROFEN 600 MG PO TABS: 600.0000 mg | ORAL_TABLET | Freq: Four times a day (QID) | ORAL | Status: DC | PRN

## 2014-06-17 MED ORDER — LACTATED RINGERS IV SOLN: 500.0000 mL | Freq: Once | INTRAVENOUS | Status: DC

## 2014-06-17 MED ORDER — LACTATED RINGERS IV SOLN
INTRAVENOUS | Status: DC
  Administered 2014-06-17: 04:00:00 via INTRAVENOUS

## 2014-06-17 MED ORDER — LANOLIN HYDROUS EX OINT: TOPICAL_OINTMENT | CUTANEOUS | Status: DC | PRN

## 2014-06-17 MED ORDER — MISOPROSTOL 200 MCG PO TABS
600.0000 ug | ORAL_TABLET | ORAL | Status: DC
  Administered 2014-06-17: 600 ug via VAGINAL
  Filled 2014-06-17: qty 3

## 2014-06-17 MED ORDER — CITRIC ACID-SODIUM CITRATE 334-500 MG/5ML PO SOLN: 30.0000 mL | ORAL | Status: DC | PRN

## 2014-06-17 MED ORDER — CALCIUM CARBONATE ANTACID 500 MG PO CHEW
2.0000 | CHEWABLE_TABLET | ORAL | Status: DC | PRN
  Filled 2014-06-17: qty 2

## 2014-06-17 MED ORDER — ACETAMINOPHEN 325 MG PO TABS
650.0000 mg | ORAL_TABLET | ORAL | Status: DC | PRN
  Administered 2014-06-17: 650 mg via ORAL
  Filled 2014-06-17: qty 2

## 2014-06-17 MED ORDER — FENTANYL 2.5 MCG/ML BUPIVACAINE 1/10 % EPIDURAL INFUSION (WH - ANES)
14.0000 mL/h | INTRAMUSCULAR | Status: DC | PRN
  Filled 2014-06-17 (×2): qty 125

## 2014-06-17 MED ORDER — MISOPROSTOL 200 MCG PO TABS
600.0000 ug | ORAL_TABLET | Freq: Once | ORAL | Status: AC
  Administered 2014-06-17: 600 ug via VAGINAL
  Filled 2014-06-17: qty 3

## 2014-06-17 MED ORDER — ALBUTEROL SULFATE (2.5 MG/3ML) 0.083% IN NEBU: 2.5000 mg | INHALATION_SOLUTION | Freq: Four times a day (QID) | RESPIRATORY_TRACT | Status: DC | PRN

## 2014-06-17 MED ORDER — ONDANSETRON HCL 4 MG PO TABS: 4.0000 mg | ORAL_TABLET | ORAL | Status: DC | PRN

## 2014-06-17 MED ORDER — PRENATAL MULTIVITAMIN CH
1.0000 | ORAL_TABLET | Freq: Every day | ORAL | Status: DC
  Filled 2014-06-17: qty 1

## 2014-06-17 MED ORDER — DIPHENHYDRAMINE HCL 25 MG PO CAPS: 25.0000 mg | ORAL_CAPSULE | Freq: Four times a day (QID) | ORAL | Status: DC | PRN

## 2014-06-17 NOTE — Progress Notes (Signed)
Patient ID: Kim Johns, female   DOB: 1991/05/16, 23 y.o.   MRN: [id number] [place]) NOTE  Kim Johns is a 23 y.o. G1P0 at [redacted]w[redacted]d  who is admitted for PPROM.   Length of Stay:  0  Days  Subjective:  Patient reports the fetal movement as active. Patient reports uterine contraction  activity as none. Patient reports  vaginal bleeding as scant staining. Patient describes fluid per vagina as Clear.  Vitals:  Blood pressure 123/64, pulse 93, temperature 98.5 F (36.9 C), temperature source Oral, resp. rate 18, height 5\' 2"  (1.575 m), weight 208 lb (94.348 kg), last menstrual period 01/26/2014. Physical Examination:  General appearance - alert, well appearing, and in no distress Abdomen - gravid, nontender,  Musculoskeletal - no evidence of DVT  Fetal Monitoring:  positive heart tones by doppler and [place]   Labs:  Results for orders placed during the hospital encounter of 06/17/14 (from the past 24 hour(s))  OB RESULTS CONSOLE GBS   Collection Time    06/17/14 12:00 AM      Result Value Ref Range   GBS Positive    WET PREP, GENITAL   Collection Time    06/17/14  2:05 AM      Result Value Ref Range   Yeast Wet Prep HPF POC NONE SEEN  NONE SEEN   Trich, Wet Prep NONE SEEN  NONE SEEN   Clue Cells Wet Prep HPF POC NONE SEEN  NONE SEEN   WBC, Wet Prep HPF POC FEW (*) NONE SEEN  GROUP B STREP BY PCR   Collection Time    06/17/14  2:05 AM      Result Value Ref Range   Group B strep by PCR POSITIVE (*) NEGATIVE  URINALYSIS, ROUTINE W REFLEX MICROSCOPIC   Collection Time    06/17/14  2:35 AM      Result Value Ref Range   Color, Urine YELLOW  YELLOW   APPearance CLEAR  CLEAR   Specific Gravity, Urine 1.020  1.005 - 1.030   pH 6.0  5.0 - 8.0   Glucose, UA 100 (*) NEGATIVE mg/dL   Hgb urine dipstick LARGE (*) NEGATIVE   Bilirubin Urine NEGATIVE  NEGATIVE   Ketones, ur NEGATIVE  NEGATIVE mg/dL   Protein, ur NEGATIVE  NEGATIVE mg/dL   Urobilinogen, UA 0.2  0.0 - 1.0 mg/dL   Nitrite NEGATIVE  NEGATIVE   Leukocytes, UA NEGATIVE  NEGATIVE  URINE RAPID DRUG SCREEN (HOSP PERFORMED)   Collection Time    06/17/14  2:35 AM      Result Value Ref Range   Opiates NONE DETECTED  NONE DETECTED   Cocaine NONE DETECTED  NONE DETECTED   Benzodiazepines NONE DETECTED  NONE DETECTED   Amphetamines NONE DETECTED  NONE DETECTED   Tetrahydrocannabinol NONE DETECTED  NONE DETECTED   Barbiturates NONE DETECTED  NONE DETECTED  URINE MICROSCOPIC-ADD ON   Collection Time    06/17/14  2:35 AM      Result Value Ref Range   Squamous Epithelial / LPF RARE  RARE   WBC, UA 0-2  <3 WBC/hpf   RBC / HPF 21-50  <3 RBC/hpf   Bacteria, UA FEW (*) RARE   Urine-Other MUCOUS PRESENT    CBC   Collection Time    06/17/14  3:01 AM      Result Value Ref Range   WBC 10.8 (*) 4.0 - 10.5 K/uL   RBC 3.69 (*) 3.87 - 5.11 MIL/uL  Hemoglobin 12.5  12.0 - 15.0 g/dL   HCT 34.6 (*) 36.0 - 46.0 %   MCV 93.8  78.0 - 100.0 fL   MCH 33.9  26.0 - 34.0 pg   MCHC 36.1 (*) 30.0 - 36.0 g/dL   RDW 12.7  11.5 - 15.5 %   Platelets 225  150 - 400 K/uL  TYPE AND SCREEN   Collection Time    06/17/14  3:05 AM      Result Value Ref Range   ABO/RH(D) O POS     Antibody Screen NEG     Sample Expiration 06/20/2014    ABO/RH   Collection Time    06/17/14  3:05 AM      Result Value Ref Range   ABO/RH(D) O POS      Imaging Studies:    Pending read.    Medications:  Scheduled . docusate sodium  100 mg Oral Daily  . prenatal multivitamin  1 tablet Oral Q1200   I have reviewed the patient's current medications.  ASSESSMENT: Patient Active Problem List   Diagnosis Date Noted  . Preterm premature rupture of membranes (PPROM) with unknown onset of labor 06/17/2014  . Normal first pregnancy confirmed, currently in first trimester 03/30/2014  . Obese 03/30/2014  . Vaginal spotting 10/19/2013    PLAN: Kim Johns is a 23 y.o. G1P0 at [redacted]w[redacted]d  who is admitted for  PPROM.    Detailed conversation today about options when PPROM in second trimester.  Pt understands risk of infection including sepsis and hysterectomy in extreme cases.  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  Pt would like expectant management at this time.  Will monitor today and consider discharge with daily temps and readmit at viability.  Kim Johns H. 06/17/2014,6:58 AM

## 2014-06-17 NOTE — MAU Note (Signed)
Patient reports feeling uncomfortable, like needing to have a bowel movement or gassy. While sitting on the toilet felt a pop and had a gush of clear fluid. Is still leaking pink tinged fluid and starting to cramp.

## 2014-06-17 NOTE — Plan of Care (Signed)
Dr. Hulan Fray at bedside checking for [place] with [place]. None found.

## 2014-06-17 NOTE — Progress Notes (Signed)
Patient ID: Kim Johns, female   DOB: [birth year], 23 y.o.   MRN: [id number]  Pt and FOB appropriately sad Bedside U/S by Dr Hulan Fray- no FHTs Abd- gravid, soft, NT  PPROM/IUFD @ 20.2wks  Will begin ripening/labor process with Cytotec 612mcg q 4 hrs Process explained to pt and FOB- questions answered  Serita Grammes CNM 06/17/2014 9:07 AM

## 2014-06-17 NOTE — Progress Notes (Signed)
06/17/14 1600  Clinical Encounter Type  Visited With Patient and family together (sister Janett Billow)  Visit Type Spiritual support;Social support  Referral From Nurse;Chaplain  Spiritual Encounters  Spiritual Needs Grief support;Emotional  Stress Factors  Patient Stress Factors Loss  Family Stress Factors Loss   Made two brief visits, before and after delivery, to offer further bereavement support.  Pt was focused on labor and baby; per sister Janett Billow, they may like to have prayer after delivery.  Family and staff know to page Spiritual Care as desired:  [phone].  [place], [place]

## 2014-06-17 NOTE — Progress Notes (Signed)
This is a late entry note for my visit with Alizah this morning.  I spent time during early labor with Lind Guest (her sister) and Jenny Reichmann (FOB) whom Addysyn lives with.  This is Sharlena's first pregnancy, but Jenny Reichmann has had other experiences with perinatal loss and, to my knowledge, no living children.  Dorris and her sister reported a difficult childhood, including some history of mental illness.  She reports that she is not currently taking any medication for that.  I encouraged her to speak with her physician to see if that may be helpful going forward from here.    I offered initial bereavement education and spoke with them about what to expect over the next several hours.  Please page for continued support from our evening chaplain, [phone].  [place] Pager, [phone] 5:07 PM   06/17/14 1700  Clinical Encounter Type  Visited With Patient and family together  Visit Type Spiritual support  Referral From Nurse  Spiritual Encounters  Spiritual Needs Grief support  Stress Factors  Patient Stress Factors Loss

## 2014-06-17 NOTE — H&P (Signed)
Kim Johns is a 23 y.o. G1P0 at [redacted]w[redacted]d admitted for PPROM at midnight tonight, desiring expectant management until viability 0  History of Present Illness: Had "large gush of fluid" around midnight tonight with subsequent constant leakage.  She has since developed small amount of bright red vaginal bleeding and mild cramps. Patient reports the fetal movement as active.   Patient Active Problem List   Diagnosis Date Noted  . Preterm premature rupture of membranes (PPROM) with unknown onset of labor 06/17/2014  . Normal first pregnancy confirmed, currently in first trimester 03/30/2014  . Obese 03/30/2014  . Vaginal spotting 10/19/2013   Past Medical History: Past Medical History  Diagnosis Date  . Allergy   . Asthma     Past Surgical History: Past Surgical History  Procedure Laterality Date  . No past surgeries      Obstetrical History: OB History   Grav Para Term Preterm Abortions TAB SAB Ect Mult Living   1                Social History: History   Social History  . Marital Status: Single    Spouse Name: N/A    Number of Children: N/A  . Years of Education: N/A   Social History Main Topics  . Smoking status: Former Smoker -- 0.50 packs/day for 10 years    Types: Cigarettes    Quit date: 02/22/2014  . Smokeless tobacco: None  . Alcohol Use: No  . Drug Use: No  . Sexual Activity: None   Other Topics Concern  . None   Social History Narrative  . None    Family History: Family History  Problem Relation Age of Onset  . Diabetes Mother   . Stroke Mother   . Hypertension Father     Allergies: Allergies  Allergen Reactions  . Shellfish Allergy Hives  . Sulfa Antibiotics Other (See Comments)    Had since childhood not sure of reaction  . Icy Hot Rash    Prescriptions prior to admission  Medication Sig Dispense Refill  . albuterol (PROVENTIL HFA;VENTOLIN HFA) 108 (90 BASE) MCG/ACT inhaler Inhale 2 puffs into the lungs every 6 (six) hours as  needed for wheezing or shortness of breath.  1 Inhaler  2  . albuterol (PROVENTIL) (2.5 MG/3ML) 0.083% nebulizer solution Take 3 mLs (2.5 mg total) by nebulization every 6 (six) hours as needed for wheezing or shortness of breath.  150 mL  1  . amoxicillin (AMOXIL) 500 MG capsule Take 1 capsule (500 mg total) by mouth 3 (three) times daily.  21 capsule  0  . fluconazole (DIFLUCAN) 150 MG tablet Take 1 tablet (150 mg total) by mouth once.  1 tablet  0  . nitrofurantoin, macrocrystal-monohydrate, (MACROBID) 100 MG capsule Take 1 capsule (100 mg total) by mouth 2 (two) times daily.  14 capsule  0  . phenazopyridine (PYRIDIUM) 200 MG tablet Take 1 tablet (200 mg total) by mouth 3 (three) times daily as needed for pain.  10 tablet  0  . Prenatal Vit-Fe Fumarate-FA (PRENATAL MULTIVITAMIN) TABS tablet Take 1 tablet by mouth daily at 12 noon.      Marland Kitchen acetaminophen (TYLENOL) 325 MG tablet Take 650 mg by mouth every 4 (four) hours as needed for mild pain.      . polyethylene glycol (MIRALAX / GLYCOLAX) packet Take 17 g by mouth daily as needed for mild constipation.        Review of Systems   Constitutional: Negative for  fever and chills Eyes: Negative for visual disturbances Respiratory: Negative for shortness of breath, dyspnea Cardiovascular: Negative for chest pain or palpitations  Gastrointestinal: Negative for vomiting, diarrhea and constipation Genitourinary: Negative for dysuria and urgency Musculoskeletal: Negative for back pain, joint pain, myalgias  Neurological: Negative for dizziness and headaches   Vitals:  Blood pressure 141/71, pulse 102, temperature 98.6 F (37 C), temperature source Oral, resp. rate 18, height 5\' 2"  (1.575 m), weight 94.348 kg (208 lb), last menstrual period 01/26/2014. Physical Examination:  General appearance - alert, well appearing, and in no distress Mental status - alert, oriented to person, place, and time Chest - clear to auscultation, no wheezes, rales or  rhonchi, symmetric air entry Heart - normal rate and regular rhythm Abdomen - Soft, gravid at umbilicus, nontender Pelvic - SSE:  +pooling bloody fluid.  Membrane fragments trailing out of cervical os, open 1 cm.  Fluid and bright red blood escaping cx os with valsalva. Neurological - DTR's normal and symmetric Musculoskeletal - full range of motion without pain Extremities - no pedal edema noted FHR 160 per doppler   Labs:  Results for orders placed during the hospital encounter of 06/17/14 (from the past 24 hour(s))  WET PREP, GENITAL   Collection Time    06/17/14  2:05 AM      Result Value Ref Range   Yeast Wet Prep HPF POC NONE SEEN  NONE SEEN   Trich, Wet Prep NONE SEEN  NONE SEEN   Clue Cells Wet Prep HPF POC NONE SEEN  NONE SEEN   WBC, Wet Prep HPF POC FEW (*) NONE SEEN      . docusate sodium  100 mg Oral Daily  . prenatal multivitamin  1 tablet Oral Q1200      ASSESSMENT:  PPROM at 20.2 weeks by 1st trimester [place] and LMP  PLAN:  Options discussed:  Expectant management with full intervention if/when viability is reached; Cytotec IOL now or if pt gets infected.  Pt and FOB wish to "do everything we can to save my baby".  Admit for now,  NICU consult Infection screen [place]

## 2014-06-17 NOTE — Progress Notes (Signed)
After multiple attempts at using the doppler to find the FHR. Manus Gunning, CNM at bedside attempting with [place] no FHR seen

## 2014-06-18 LAB — GC/CHLAMYDIA PROBE AMP
CT PROBE, AMP APTIMA: NEGATIVE
GC PROBE AMP APTIMA: NEGATIVE

## 2014-06-18 NOTE — Discharge Summary (Signed)
Attestation of Attending Supervision of Obstetric Fellow: Evaluation and management procedures were performed by the Obstetric Fellow under my supervision and collaboration.  I have reviewed the Obstetric Fellow's note and chart, and I agree with the management and plan.  Verita Schneiders, MD, [place] Attending [place], [facility]

## 2014-06-18 NOTE — Progress Notes (Signed)
Discharge instructions reviewed with patient.  Patient states understanding of home care, activity, medications, signs/symptoms to report to MD and return MD office visit.  No home equipment needed and significant other will assist with patients care @ home.  Patient ambulated for discharge in stable condition with Kim Johns, NT without incident.

## 2014-06-18 NOTE — Discharge Instructions (Signed)
Vaginal Delivery, Care After Refer to this sheet in the next few weeks. These instructions provide you with information on caring for yourself after your delivery. Your health care provider may also give you more specific instructions. Your treatment has been planned according to current medical practices, but problems sometimes occur. Call your health care provider if you have any problems or questions after your delivery. WHAT TO EXPECT AFTER YOUR DELIVERY After your delivery, it is typical to have the following:   You may feel pain in the vaginal area for several days after delivery. If you had an incision or a vaginal tear, the area will probably continue to be tender to the touch for several weeks.  You may feel very fatigued after a vaginal delivery.  You may have vaginal bleeding and discharge that will start out red, then become pink, then yellow, then white. Altogether, this usually lasts for about 6 weeks.  The combination of having lost your baby and changing hormones from the delivery can make you feel very sad. You may also experience emotions that change very quickly. Some of the emotions people often notice after loss include:  Anger.  Denial.  Guilt.  Sorrow.  Depression.  Grief.  Relationship problems. HOME CARE INSTRUCTIONS   Consider seeking support for your loss. Some forms of support that you might consider include your religious leader, friends, family, a Medical illustrator, or a bereavement support group.  Take medicines only as directed by your health care provider.  Continue to use good perineal care. Good perineal care includes:   Wiping your perineum from front to back.   Keeping your perineum clean.   Do not use tampons or douche until your health care provider says it is okay.   Shower, wash your hair, and take tub baths as directed by your health care provider.   Wear a well-fitting bra that provides breast support.   Drink enough  fluids to keep your urine clear or pale yellow.   Eat healthy foods.   Eat high-fiber foods every day, such as whole grain cereals and breads, brown rice, beans, and fresh fruits and vegetables. These foods may help prevent or relieve constipation.   Follow your health care provider's directions about resuming activities such as climbing stairs, driving, lifting, exercising, or traveling.   Increase your activities gradually.   Talk to your health care provider about resuming sexual activities. This depends on your risk of infection, your rate of healing, and your comfort and desire to resume sexual activity.   Try to have someone help you with your household activities for at least a few days after you leave the hospital.   Rest as much as possible.  Keep all of your scheduled postpartum appointments. It is very important to keep your scheduled follow-up appointments. At these appointments, your health care provider will be checking to make sure that you are healing physically and emotionally.   Do not drink alcohol, especially if you are taking medicine to relieve pain.   Do not use any tobacco products including cigarettes, chewing tobacco, or electronic cigarettes. If you need help quitting, ask your health care provider.  Do not use illegal drugs.  SEEK MEDICAL CARE IF:   You feel sad or depressed.   You have thoughts of hurting yourself.  You are having trouble eating or sleeping.  You cannot enjoy the things in life you have previously enjoyed.  You are passing large clots from your vagina. Save any clots to  show your health care provider.   You have a bad smelling discharge from your vagina.   You have trouble urinating.   You are urinating frequently.   You have pain when you urinate.   You have a change in your bowel movements.   You have increasing redness, pain, or swelling near your incision or vaginal tear.   You have pus draining from  your incision or vaginal tear.   Your incision or vaginal tear is separating.   You have painful, hard, or reddened breasts.   You have a severe headache.   You have blurred vision or see spots.   You are dizzy or light-headed.   You have a rash.   You have nausea or vomiting.   You have not had a menstrual period by the 12th week after delivery.   You have a fever.  SEEK IMMEDIATE MEDICAL CARE IF:   You are concerned that you may hurt yourself or you are considering suicide.   You have persistent pain.   You have chest pain.   You have shortness of breath.   You faint.   You have leg pain.   You have stomach pain.   Your vaginal bleeding saturates two or more sanitary pads in 1 hour.  MAKE SURE YOU:  Understand these instructions.   Will watch your condition.   Will get help right away if you are not doing well or get worse.  Document Released: 03/28/2014 Document Reviewed: 11/16/2013 [facility] Patient Information 2015 [place], [place]. This information is not intended to replace advice given to you by your health care provider. Make sure you discuss any questions you have with your health care provider.

## 2014-06-18 NOTE — Discharge Summary (Signed)
  Obstetric Discharge Summary Reason for Admission: PPROM Prenatal Procedures: none Intrapartum Procedures: IOL after IUFD Postpartum Procedures: none Complications-Operative and Postpartum: none  Delivery Note At 3:59 PM a non-viable female was delivered via Vaginal, Spontaneous Delivery (Presentation: ;  ).  APGAR: 0, 0; weight 0 lb (0 g).   Placenta status: Intact, Spontaneous.  Cord:  with the following complications: .   Anesthesia: None  Episiotomy: None Lacerations: None Suture Repair: none Est. Blood Loss (mL):   Mom to postpartum.  Baby to [place].    Hospital Course:  Active Problems:   Preterm premature rupture of membranes (PPROM) with unknown onset of labor   Fetal demise, less than 22 weeks   Today: No acute events overnight.  Pt denies problems with ambulating, voiding or po intake.  She denies nausea or vomiting.  Pain is well controlled.  She has had flatus. She has not had bowel movement.  Lochia Moderate.  Plan for birth control is  declines.   Kim Johns is a 23 y.o. G1P0 s/p IUFD after PPROM at [redacted]w[redacted]d.  She has postoperative course that was uncomplicated including no problems with ambulating, PO intake, urination, pain, or bleeding. The pt feels ready to go home and  will be discharged with outpatient follow-up.    H/H: Lab Results  Component Value Date/Time   HGB 12.5 06/17/2014  3:01 AM   HCT 34.6* 06/17/2014  3:01 AM    Discharge Diagnoses: premature prolonged rupture of membranes, intrauterine fetal demise  Discharge Information: Date: 06/18/14 Activity: pelvic rest Diet: routine  Medications: None Breast feeding: n/a Condition: stable Instructions: refer to handout Discharge to: home      Medication List    STOP taking these medications       amoxicillin 500 MG capsule  Commonly known as:  AMOXIL     fluconazole 150 MG tablet  Commonly known as:  DIFLUCAN     nitrofurantoin (macrocrystal-monohydrate) 100 MG capsule  Commonly  known as:  MACROBID     phenazopyridine 200 MG tablet  Commonly known as:  PYRIDIUM      TAKE these medications       acetaminophen 325 MG tablet  Commonly known as:  TYLENOL  Take 650 mg by mouth every 4 (four) hours as needed for mild pain.     albuterol 108 (90 BASE) MCG/ACT inhaler  Commonly known as:  PROVENTIL HFA;VENTOLIN HFA  Inhale 2 puffs into the lungs every 6 (six) hours as needed for wheezing or shortness of breath.     albuterol (2.5 MG/3ML) 0.083% nebulizer solution  Commonly known as:  PROVENTIL  Take 3 mLs (2.5 mg total) by nebulization every 6 (six) hours as needed for wheezing or shortness of breath.     polyethylene glycol packet  Commonly known as:  MIRALAX / GLYCOLAX  Take 17 g by mouth daily as needed for mild constipation.     prenatal multivitamin Tabs tablet  Take 1 tablet by mouth daily at 12 noon.         Merla Riches ,MD OB Fellow 06/18/2014,7:52 AM

## 2014-06-20 ENCOUNTER — Telehealth: Payer: Self-pay | Admitting: General Practice

## 2014-06-20 NOTE — H&P (Signed)
Attestation of Attending Supervision of Advanced Practitioner (CNM/NP): Evaluation and management procedures were performed by the Advanced Practitioner under my supervision and collaboration. I have reviewed the Advanced Practitioner's note and chart, and I agree with the management and plan.  Kaedence Connelly H. 9:35 AM

## 2014-06-20 NOTE — Telephone Encounter (Signed)
Patient called and left message stating her water broke Thursday night and she came into the hospital and the baby's heart rate couldn't be found and she had to end up delivering. Patient also states that for the past several weeks she has been having this pain like a bladder infection and problems getting her urine out and she went to the MAU and they found no infection either but they gave her medication anyway just in case and she is still having those same pains and no one is looking into it or trying to do anything about it and she is still hurting. Per chart review patient did have UTI on 6/30 and was informed and took abx for it. Called patient back and she states that she is still having those same pains and thinks she has had them even before pregnancy and feels like her bladder or something is swollen and it hurts in her lower left side and thinks there could be something wrong with her bladder. Told patient she will need an appt to see [place] late August for her post partum visit and that she can certainly discuss those concerns then with the doctor and that she can come by the office anytime we are open to have her urine tested for any infection. Also discussed wiping front to back to help prevent infection as well. Patient verbalized understanding to all and wants to know if they will be able to tell her what might have happened to her baby at that time too. Told patient that I wasn't sure they could definitively tell her what happened but that they could certainly discuss those things at that visit as well. Patient verbalized understanding and had no other questions. Patient knows someone will be contacting her with an appt.

## 2014-06-20 NOTE — Progress Notes (Signed)
Post discharge ur review completed. 

## 2014-06-21 ENCOUNTER — Encounter: Payer: Medicaid Other | Admitting: Obstetrics and Gynecology

## 2014-06-22 ENCOUNTER — Encounter (HOSPITAL_COMMUNITY): Payer: Self-pay | Admitting: *Deleted

## 2014-06-23 ENCOUNTER — Encounter (HOSPITAL_COMMUNITY): Payer: Self-pay

## 2014-06-23 ENCOUNTER — Inpatient Hospital Stay (HOSPITAL_COMMUNITY)
Admission: AD | Admit: 2014-06-23 | Discharge: 2014-06-23 | Disposition: A | Discharge status: Home / Self Care | Payer: Medicaid Other | Source: Ambulatory Visit | Attending: Obstetrics and Gynecology | Admitting: Obstetrics and Gynecology

## 2014-06-23 DIAGNOSIS — R1032 Left lower quadrant pain: Secondary | ICD-10-CM | POA: Diagnosis not present

## 2014-06-23 DIAGNOSIS — O99893 Other specified diseases and conditions complicating puerperium: Secondary | ICD-10-CM | POA: Diagnosis not present

## 2014-06-23 DIAGNOSIS — J45909 Unspecified asthma, uncomplicated: Secondary | ICD-10-CM | POA: Diagnosis not present

## 2014-06-23 DIAGNOSIS — O9989 Other specified diseases and conditions complicating pregnancy, childbirth and the puerperium: Principal | ICD-10-CM

## 2014-06-23 DIAGNOSIS — R109 Unspecified abdominal pain: Secondary | ICD-10-CM | POA: Diagnosis present

## 2014-06-23 DIAGNOSIS — Z87891 Personal history of nicotine dependence: Secondary | ICD-10-CM | POA: Insufficient documentation

## 2014-06-23 LAB — COMPREHENSIVE METABOLIC PANEL
ALT: 23 U/L (ref 0–35)
AST: 21 U/L (ref 0–37)
Albumin: 3.5 g/dL (ref 3.5–5.2)
Alkaline Phosphatase: 38 U/L — ABNORMAL LOW (ref 39–117)
Anion gap: 12 (ref 5–15)
BUN: 7 mg/dL (ref 6–23)
CO2: 24 meq/L (ref 19–32)
Calcium: 9.4 mg/dL (ref 8.4–10.5)
Chloride: 105 mEq/L (ref 96–112)
Creatinine, Ser: 0.85 mg/dL (ref 0.50–1.10)
GFR calc Af Amer: >90 mL/min (ref >90)
GFR calc non Af Amer: >90 mL/min (ref >90)
Glucose, Bld: 85 mg/dL (ref 70–99)
Potassium: 4.6 mEq/L (ref 3.7–5.3)
SODIUM: 141 meq/L (ref 137–147)
TOTAL PROTEIN: 6.9 g/dL (ref 6.0–8.3)
Total Bilirubin: 0.3 mg/dL (ref 0.3–1.2)

## 2014-06-23 LAB — URINALYSIS, ROUTINE W REFLEX MICROSCOPIC
Bilirubin Urine: NEGATIVE
Glucose, UA: NEGATIVE mg/dL
Ketones, ur: NEGATIVE mg/dL
LEUKOCYTES UA: NEGATIVE
NITRITE: NEGATIVE
PH: 6.5 (ref 5.0–8.0)
Protein, ur: NEGATIVE mg/dL
Urobilinogen, UA: 0.2 mg/dL (ref 0.0–1.0)

## 2014-06-23 LAB — CBC
HCT: 33.9 % — ABNORMAL LOW (ref 36.0–46.0)
HEMOGLOBIN: 12.3 g/dL (ref 12.0–15.0)
MCH: 34.1 pg — ABNORMAL HIGH (ref 26.0–34.0)
MCHC: 36.3 g/dL — AB (ref 30.0–36.0)
MCV: 93.9 fL (ref 78.0–100.0)
Platelets: 252 10*3/uL (ref 150–400)
RBC: 3.61 MIL/uL — AB (ref 3.87–5.11)
RDW: 12.5 % (ref 11.5–15.5)
WBC: 7.6 10*3/uL (ref 4.0–10.5)

## 2014-06-23 LAB — URINE MICROSCOPIC-ADD ON

## 2014-06-23 MED ORDER — IBUPROFEN 800 MG PO TABS: 800.0000 mg | ORAL_TABLET | Freq: Three times a day (TID) | ORAL | Status: DC | PRN

## 2014-06-23 MED ORDER — OXYCODONE-ACETAMINOPHEN 5-325 MG PO TABS
2.0000 | ORAL_TABLET | Freq: Once | ORAL | Status: AC
  Administered 2014-06-23: 2 via ORAL
  Filled 2014-06-23: qty 2

## 2014-06-23 NOTE — MAU Provider Note (Signed)
History     CSN: [id number]  Arrival date and time: 06/23/14 0229   First Provider Initiated Contact with Patient 06/23/14 0254      Chief Complaint  Patient presents with  . Abdominal Pain  . Postpartum Complications   Abdominal Pain   Kim Johns is a 23 y.o. G1P1 who is s/p NSVD of a pre-viable preterm infant on 06/17/14. She states that she has been at home without any problems. However, tonight she was awoken from her sleep with LLQ pain. She states that she has not taken anything for the pain at this time. She states that she "figures something must be wrong because everything has been wrong with this entire pregnancy and everyone kept telling me everything was ok". She states that she is still having some bleeding. She states that it is off and on and is very light. She denies any fever, nausea or vomiting. She had a BM on 06/22/14.   Past Medical History  Diagnosis Date  . Allergy   . Asthma     albuterol last used a few months ago  . Preterm labor     Past Surgical History  Procedure Laterality Date  . Wisdom tooth extraction      Family History  Problem Relation Age of Onset  . Diabetes Mother   . Stroke Mother   . Hypertension Father     History  Substance Use Topics  . Smoking status: Former Smoker -- 0.50 packs/day for 10 years    Types: Cigarettes    Quit date: 02/22/2014  . Smokeless tobacco: Never Used  . Alcohol Use: No    Allergies:  Allergies  Allergen Reactions  . Shellfish Allergy Hives  . Sulfa Antibiotics Other (See Comments)    Had since childhood not sure of reaction  . Icy Hot Rash    Prescriptions prior to admission  Medication Sig Dispense Refill  . acetaminophen (TYLENOL) 325 MG tablet Take 650 mg by mouth every 4 (four) hours as needed for mild pain.      . polyethylene glycol (MIRALAX / GLYCOLAX) packet Take 17 g by mouth daily as needed for mild constipation.      . Prenatal Vit-Fe Fumarate-FA (PRENATAL MULTIVITAMIN) TABS  tablet Take 1 tablet by mouth daily at 12 noon.      Marland Kitchen albuterol (PROVENTIL HFA;VENTOLIN HFA) 108 (90 BASE) MCG/ACT inhaler Inhale 2 puffs into the lungs every 6 (six) hours as needed for wheezing or shortness of breath.  1 Inhaler  2  . albuterol (PROVENTIL) (2.5 MG/3ML) 0.083% nebulizer solution Take 3 mLs (2.5 mg total) by nebulization every 6 (six) hours as needed for wheezing or shortness of breath.  150 mL  1    Review of Systems  Gastrointestinal: Positive for abdominal pain.   Physical Exam   Blood pressure 132/87, pulse 75, temperature 98.2 F (36.8 C), temperature source Oral, resp. rate 18, height 5\' 2"  (1.575 m), weight 93.078 kg (205 lb 3.2 oz), last menstrual period 01/26/2014.  Physical Exam  Nursing note and vitals reviewed. Constitutional: She is oriented to person, place, and time. She appears well-developed and well-nourished. No distress.  Cardiovascular: Normal rate.   Respiratory: Effort normal.  GI: Soft. There is no tenderness. There is no rebound.  Neurological: She is alert and oriented to person, place, and time.  Skin: Skin is warm and dry.  Psychiatric:  Flat affect, appears appropriately sad. Although somatic complaints may have a psychological component.  MAU Course  Procedures Results for orders placed during the hospital encounter of 06/23/14 (from the past 24 hour(s))  URINALYSIS, ROUTINE W REFLEX MICROSCOPIC     Status: Abnormal   Collection Time    06/23/14  2:45 AM      Result Value Ref Range   Color, Urine YELLOW  YELLOW   APPearance CLEAR  CLEAR   Specific Gravity, Urine <1.005 (*) 1.005 - 1.030   pH 6.5  5.0 - 8.0   Glucose, UA NEGATIVE  NEGATIVE mg/dL   Hgb urine dipstick LARGE (*) NEGATIVE   Bilirubin Urine NEGATIVE  NEGATIVE   Ketones, ur NEGATIVE  NEGATIVE mg/dL   Protein, ur NEGATIVE  NEGATIVE mg/dL   Urobilinogen, UA 0.2  0.0 - 1.0 mg/dL   Nitrite NEGATIVE  NEGATIVE   Leukocytes, UA NEGATIVE  NEGATIVE  URINE MICROSCOPIC-ADD  ON     Status: Abnormal   Collection Time    06/23/14  2:45 AM      Result Value Ref Range   Squamous Epithelial / LPF FEW (*) RARE   WBC, UA 0-2  <3 WBC/hpf   RBC / HPF 0-2  <3 RBC/hpf   Bacteria, UA RARE  RARE  CBC     Status: Abnormal   Collection Time    06/23/14  3:20 AM      Result Value Ref Range   WBC 7.6  4.0 - 10.5 K/uL   RBC 3.61 (*) 3.87 - 5.11 MIL/uL   Hemoglobin 12.3  12.0 - 15.0 g/dL   HCT 33.9 (*) 36.0 - 46.0 %   MCV 93.9  78.0 - 100.0 fL   MCH 34.1 (*) 26.0 - 34.0 pg   MCHC 36.3 (*) 30.0 - 36.0 g/dL   RDW 12.5  11.5 - 15.5 %   Platelets 252  150 - 400 K/uL  COMPREHENSIVE METABOLIC PANEL     Status: Abnormal   Collection Time    06/23/14  3:20 AM      Result Value Ref Range   Sodium 141  137 - 147 mEq/L   Potassium 4.6  3.7 - 5.3 mEq/L   Chloride 105  96 - 112 mEq/L   CO2 24  19 - 32 mEq/L   Glucose, Bld 85  70 - 99 mg/dL   BUN 7  6 - 23 mg/dL   Creatinine, Ser 0.85  0.50 - 1.10 mg/dL   Calcium 9.4  8.4 - 10.5 mg/dL   Total Protein 6.9  6.0 - 8.3 g/dL   Albumin 3.5  3.5 - 5.2 g/dL   AST 21  0 - 37 U/L   ALT 23  0 - 35 U/L   Alkaline Phosphatase 38 (*) 39 - 117 U/L   Total Bilirubin 0.3  0.3 - 1.2 mg/dL   GFR calc non Af Amer >90  >90 mL/min   GFR calc Af Amer >90  >90 mL/min   Anion gap 12  5 - 15     Pain relieved with percocet. Afebrile and labs normal. Will DC home.  Assessment and Plan   1. LLQ pain    UC pending Continue with tylenol and ibuprofen as needed Return to MAU as needed PP care and what to expect reviewed with the patient Follow-up Information   Follow up with [facility]. (As scheduled)    Specialty:  Obstetrics and Gynecology   Contact information:   [place] [address] [phone]       Norman Herrlich,  Tamala Bari 06/23/2014, 4:06 AM

## 2014-06-23 NOTE — Discharge Instructions (Signed)
Postpartum Care After Vaginal Delivery After you deliver your newborn (postpartum period), the usual stay in the hospital is 24-72 hours. If there were problems with your labor or delivery, or if you have other medical problems, you might be in the hospital longer.    While you are in the hospital:  Be sure to tell your nurses if you have pain or discomfort, as well as where you feel the pain and what makes the pain worse.  If you had an incision made near your vagina (episiotomy) or if you had some tearing during delivery, the nurses may put ice packs on your episiotomy or tear. The ice packs may help to reduce the pain and swelling.  You may feel uncomfortable contractions of your uterus for a couple of weeks. This is normal. The contractions help your uterus get back to normal size.  It is normal to have some bleeding after delivery.  For the first 1-3 days after delivery, the flow is red and the amount may be similar to a period.  It is common for the flow to start and stop.  In the first few days, you may pass some small clots. Let your nurses know if you begin to pass large clots or your flow increases.  Do not  flush blood clots down the toilet before having the nurse look at them.  During the next 3-10 days after delivery, your flow should become more watery and pink or brown-tinged in color.  Ten to fourteen days after delivery, your flow should be a small amount of yellowish-white discharge.  The amount of your flow will decrease over the first few weeks after delivery. Your flow may stop in 6-8 weeks. Most women have had their flow stop by 12 weeks after delivery.  You should change your sanitary pads frequently.  Wash your hands thoroughly with soap and water for at least 20 seconds after changing pads, using the toilet, or before holding or feeding your newborn.  You should feel like you need to empty your bladder within the first 6-8 hours after delivery.  In case you  become weak, lightheaded, or faint, call your nurse before you get out of bed for the first time and before you take a shower for the first time.  Within the first few days after delivery, your breasts may begin to feel tender and full. This is called engorgement. Breast tenderness usually goes away within 48-72 hours after engorgement occurs. You may also notice milk leaking from your breasts. If you are not breastfeeding, do not stimulate your breasts. Breast stimulation can make your breasts produce more milk.  Your hormones change after delivery. Sometimes the hormone changes can temporarily cause you to feel sad or tearful. These feelings should not last more than a few days. If these feelings last longer than that, you should talk to your caregiver.  If desired, talk to your caregiver about methods of family planning or contraception.  Talk to your caregiver about immunizations. Your caregiver may want you to have the following immunizations before leaving the hospital:  Tetanus, diphtheria, and pertussis (Tdap) or tetanus and diphtheria (Td) immunization. It is very important that you.  Rubella immunization.  Varicella (chickenpox) immunization.  Influenza immunization. You should receive this annual immunization if you did not receive the immunization during your pregnancy. Document Released: 09/08/2007 Document Revised: 08/05/2012 Document Reviewed: 07/08/2012 [facility] Patient Information 2015 [place], [place]. This information is not intended to replace advice given to you by your  health care provider. Make sure you discuss any questions you have with your health care provider. ° °

## 2014-06-23 NOTE — MAU Note (Signed)
Sharp lower abdominal pain since delivery over the weekend. Vaginal bleeding had stopped, but has started back. Unsure if has had fever b/c no thermometer at home.

## 2014-06-24 LAB — URINE CULTURE: Colony Count: 75000

## 2014-06-28 NOTE — MAU Provider Note (Signed)
Pap done, will follow up results and manage accordingly. Mammogram scheduled Routine preventative health maintenance measures emphasized   Verita Schneiders, MD, [facility] Attending [place], [place]

## 2014-07-15 ENCOUNTER — Encounter: Payer: Self-pay | Admitting: Physician Assistant

## 2014-07-15 ENCOUNTER — Ambulatory Visit (INDEPENDENT_AMBULATORY_CARE_PROVIDER_SITE_OTHER): Payer: Medicaid Other | Admitting: Physician Assistant

## 2014-07-15 VITALS — BP 99/67 | HR 69 | Temp 98.8°F | Wt 202.7 lb

## 2014-07-15 DIAGNOSIS — Z3040 Encounter for surveillance of contraceptives, unspecified: Secondary | ICD-10-CM

## 2014-07-15 LAB — POCT URINALYSIS DIP (DEVICE)
Bilirubin Urine: NEGATIVE
Glucose, UA: NEGATIVE mg/dL
Ketones, ur: NEGATIVE mg/dL
Leukocytes, UA: NEGATIVE
Nitrite: NEGATIVE
Protein, ur: NEGATIVE mg/dL
SPECIFIC GRAVITY, URINE: 1.02 (ref 1.005–1.030)
UROBILINOGEN UA: 1 mg/dL (ref 0.0–1.0)
pH: 5.5 (ref 5.0–8.0)

## 2014-07-15 NOTE — Progress Notes (Signed)
  Subjective:     Kim Johns is a 23 y.o. female who presents for a postpartum visit. She is 4 weeks postpartum following a spontaneous vaginal delivery at 20 weeks of nonviable baby girl. I have fully reviewed the prenatal and intrapartum course. The delivery was at [redacted]w[redacted]d gestational weeks with PPROM. Outcome: spontaneous vaginal delivery. Anesthesia: regional. Postpartum course has been uneventful. Baby was nonviable.   Bleeding no bleeding. Bowel function is normal. Bladder function is normal. Patient is not sexually active. Contraception method is condoms. Postpartum depression screening: positive.  She has a history of bipolar disorder.  The following portions of the patient's history were reviewed and updated as appropriate: allergies, current medications, past family history, past medical history, past social history, past surgical history and problem list.  Review of Systems A comprehensive review of systems was negative.   Objective:    BP 99/67  Pulse 69  Temp(Src) 98.8 F (37.1 C)  Wt 202 lb 11.2 oz (91.944 kg)  General:  alert and cooperative     Lungs: clear to auscultation bilaterally  Heart:  regular rate and rhythm, S1, S2 normal, no murmur, click, rub or gallop  Abdomen: soft, non-tender; bowel sounds normal; no masses,  no organomegaly   Vulva:  not evaluated  Vagina: not evaluated  Cervix:  not evaluated  Corpus: not examined     Rectal Exam: Not performed.        Assessment:   Normal postpartum exam. Pap smear not done at today's visit.   Plan:    1. Contraception: condoms 2. Awaiting autopsy report 3. Follow up in:1 year or as needed.  Continue PNV Plans to wait at least 6 months before pursuing conception. Given information to contact [facility] for counselling/support. Continue with mental health provider for pre-existing bipolar disorder.

## 2014-07-15 NOTE — Patient Instructions (Signed)
Postpartum Depression and Baby Blues The postpartum period begins right after the birth of a baby. During this time, there is often a great amount of joy and excitement. It is also a time of many changes in the life of the parents. Regardless of how many times a mother gives birth, each child brings new challenges and dynamics to the family. It is not unusual to have feelings of excitement along with confusing shifts in moods, emotions, and thoughts. All mothers are at risk of developing postpartum depression or the "baby blues." These mood changes can occur right after giving birth, or they may occur many months after giving birth. The baby blues or postpartum depression can be mild or severe. Additionally, postpartum depression can go away rather quickly, or it can be a long-term condition.  CAUSES Raised hormone levels and the rapid drop in those levels are thought to be a main cause of postpartum depression and the baby blues. A number of hormones change during and after pregnancy. Estrogen and progesterone usually decrease right after the delivery of your baby. The levels of thyroid hormone and various cortisol steroids also rapidly drop. Other factors that play a role in these mood changes include major life events and genetics.  RISK FACTORS If you have any of the following risks for the baby blues or postpartum depression, know what symptoms to watch out for during the postpartum period. Risk factors that may increase the likelihood of getting the baby blues or postpartum depression include:  Having a personal or family history of depression.   Having depression while being pregnant.   Having premenstrual mood issues or mood issues related to oral contraceptives.  Having a lot of life stress.   Having marital conflict.   Lacking a social support network.   Having a baby with special needs.   Having health problems, such as diabetes.  SIGNS AND SYMPTOMS Symptoms of baby blues  include:  Brief changes in mood, such as going from extreme happiness to sadness.  Decreased concentration.   Difficulty sleeping.   Crying spells, tearfulness.   Irritability.   Anxiety.  Symptoms of postpartum depression typically begin within the first month after giving birth. These symptoms include:  Difficulty sleeping or excessive sleepiness.   Marked weight loss.   Agitation.   Feelings of worthlessness.   Lack of interest in activity or food.  Postpartum psychosis is a very serious condition and can be dangerous. Fortunately, it is rare. Displaying any of the following symptoms is cause for immediate medical attention. Symptoms of postpartum psychosis include:   Hallucinations and delusions.   Bizarre or disorganized behavior.   Confusion or disorientation.  DIAGNOSIS  A diagnosis is made by an evaluation of your symptoms. There are no medical or lab tests that lead to a diagnosis, but there are various questionnaires that a health care provider may use to identify those with the baby blues, postpartum depression, or psychosis. Often, a screening tool called the Edinburgh Postnatal Depression Scale is used to diagnose depression in the postpartum period.  TREATMENT The baby blues usually goes away on its own in 1-2 weeks. Social support is often all that is needed. You will be encouraged to get adequate sleep and rest. Occasionally, you may be given medicines to help you sleep.  Postpartum depression requires treatment because it can last several months or longer if it is not treated. Treatment may include individual or group therapy, medicine, or both to address any social, physiological, and psychological   factors that may play a role in the depression. Regular exercise, a healthy diet, rest, and social support may also be strongly recommended.  Postpartum psychosis is more serious and needs treatment right away. Hospitalization is often needed. HOME CARE  INSTRUCTIONS  Get as much rest as you can. Nap when the baby sleeps.   Exercise regularly. Some women find yoga and walking to be beneficial.   Eat a balanced and nourishing diet.   Do little things that you enjoy. Have a cup of tea, take a bubble bath, read your favorite magazine, or listen to your favorite music.  Avoid alcohol.   Ask for help with household chores, cooking, grocery shopping, or running errands as needed. Do not try to do everything.   Talk to people close to you about how you are feeling. Get support from your partner, family members, friends, or other new moms.  Try to stay positive in how you think. Think about the things you are grateful for.   Do not spend a lot of time alone.   Only take over-the-counter or prescription medicine as directed by your health care provider.  Keep all your postpartum appointments.   Let your health care provider know if you have any concerns.  SEEK MEDICAL CARE IF: You are having a reaction to or problems with your medicine. SEEK IMMEDIATE MEDICAL CARE IF:  You have suicidal feelings.   You think you may harm the baby or someone else. MAKE SURE YOU:  Understand these instructions.  Will watch your condition.  Will get help right away if you are not doing well or get worse. Document Released: 08/15/2004 Document Revised: 11/16/2013 Document Reviewed: 08/23/2013 [employer] Patient Information 2015 [employer]. This information is not intended to replace advice given to you by your health care provider. Make sure you discuss any questions you have with your health care provider.  

## 2014-07-15 NOTE — Progress Notes (Signed)
Patient ID: Kim Johns, female   DOB: 11-14-1991, 23 y.o.   MRN: [id number] Pt complains of left sided abdominal pain and urinary frequency

## 2014-07-19 ENCOUNTER — Telehealth: Payer: Self-pay | Admitting: *Deleted

## 2014-07-19 NOTE — Telephone Encounter (Signed)
Pt called requesting an appointment for cyst on vaginal area.  Contacted patient and assessed pain of the cyst on her vaginal area.  Pt states she is having pain, encouraged patient to go to Urgent Care or MAU for evaluation.  Pt verbalizes understanding.

## 2014-07-26 DEATH — deceased

## 2014-08-04 ENCOUNTER — Encounter (HOSPITAL_COMMUNITY): Payer: Self-pay | Admitting: *Deleted

## 2014-08-04 ENCOUNTER — Inpatient Hospital Stay (HOSPITAL_COMMUNITY)
Admission: AD | Admit: 2014-08-04 | Discharge: 2014-08-05 | Disposition: A | Discharge status: Home / Self Care | Payer: Medicaid Other | Source: Ambulatory Visit | Attending: Obstetrics & Gynecology | Admitting: Obstetrics & Gynecology

## 2014-08-04 DIAGNOSIS — N760 Acute vaginitis: Secondary | ICD-10-CM | POA: Diagnosis not present

## 2014-08-04 DIAGNOSIS — A499 Bacterial infection, unspecified: Secondary | ICD-10-CM | POA: Insufficient documentation

## 2014-08-04 DIAGNOSIS — B9689 Other specified bacterial agents as the cause of diseases classified elsewhere: Secondary | ICD-10-CM | POA: Insufficient documentation

## 2014-08-04 DIAGNOSIS — Z87891 Personal history of nicotine dependence: Secondary | ICD-10-CM | POA: Diagnosis not present

## 2014-08-04 DIAGNOSIS — N898 Other specified noninflammatory disorders of vagina: Secondary | ICD-10-CM | POA: Diagnosis present

## 2014-08-04 LAB — URINALYSIS, ROUTINE W REFLEX MICROSCOPIC
Bilirubin Urine: NEGATIVE
GLUCOSE, UA: NEGATIVE mg/dL
HGB URINE DIPSTICK: NEGATIVE
KETONES UR: NEGATIVE mg/dL
Leukocytes, UA: NEGATIVE
Nitrite: NEGATIVE
PROTEIN: NEGATIVE mg/dL
Specific Gravity, Urine: 1.01 (ref 1.005–1.030)
Urobilinogen, UA: 1 mg/dL (ref 0.0–1.0)
pH: 7.5 (ref 5.0–8.0)

## 2014-08-04 LAB — WET PREP, GENITAL: Trich, Wet Prep: NONE SEEN

## 2014-08-04 LAB — POCT PREGNANCY, URINE: Preg Test, Ur: NEGATIVE

## 2014-08-04 NOTE — MAU Note (Signed)
Pt states she has a yeast infection or a bacterial infection or maybe both as per pt.

## 2014-08-04 NOTE — MAU Note (Addendum)
PT  SAYS SHE DEL VAG ON 7-24-   AT [redacted] WEEKS   GESTATION-   DEMISE.     NOW SAYS ON Tuesday-  HAS  THICK/WHITE AND FOUL ODOR D/C.   NO BIRTH CONTROL.  LAST SEX-     1 WEEK  AGO. Marland Kitchen  BLEEDING   X3 WEEKS-  THEN 2 WEEKS  LATER  HAD  MAYBE A CYCLE - UNSURE.

## 2014-08-04 NOTE — MAU Provider Note (Signed)
History     CSN: [id number]  Arrival date and time: 08/04/14 2125   First Provider Initiated Contact with Patient 08/04/14 2317      Chief Complaint  Patient presents with  . Vaginal Discharge   HPI Comments: Kim Johns 23 y.o. G1P0010 presents to MAU with vaginal discharge x 3 days. She is sexually active with one partner and they use condoms for birth control. Last intercourse was one week ago.  She is 7 weeks postpartum after delivering a 21 week fetal demise on 06/17/14.  Vaginal Discharge The patient's primary symptoms include a vaginal discharge.      Past Medical History  Diagnosis Date  . Allergy   . Asthma     albuterol last used a few months ago  . Preterm labor     Past Surgical History  Procedure Laterality Date  . Wisdom tooth extraction      Family History  Problem Relation Age of Onset  . Diabetes Mother   . Stroke Mother   . Hypertension Father     History  Substance Use Topics  . Smoking status: Former Smoker -- 0.50 packs/day for 10 years    Types: Cigarettes    Quit date: 02/22/2014  . Smokeless tobacco: Never Used  . Alcohol Use: No    Allergies:  Allergies  Allergen Reactions  . Shellfish Allergy Hives  . Sulfa Antibiotics Other (See Comments)    Had since childhood not sure of reaction  . Icy Hot Rash    Prescriptions prior to admission  Medication Sig Dispense Refill  . acetaminophen (TYLENOL) 325 MG tablet Take 650 mg by mouth every 4 (four) hours as needed for mild pain.      Marland Kitchen albuterol (PROVENTIL HFA;VENTOLIN HFA) 108 (90 BASE) MCG/ACT inhaler Inhale 2 puffs into the lungs every 6 (six) hours as needed for wheezing or shortness of breath.  1 Inhaler  2  . albuterol (PROVENTIL) (2.5 MG/3ML) 0.083% nebulizer solution Take 3 mLs (2.5 mg total) by nebulization every 6 (six) hours as needed for wheezing or shortness of breath.  150 mL  1  . ibuprofen (ADVIL,MOTRIN) 800 MG tablet Take 1 tablet (800 mg total) by mouth every 8  (eight) hours as needed.  30 tablet  0  . polyethylene glycol (MIRALAX / GLYCOLAX) packet Take 17 g by mouth daily as needed for mild constipation.      . Prenatal Vit-Fe Fumarate-FA (PRENATAL MULTIVITAMIN) TABS tablet Take 1 tablet by mouth daily at 12 noon.        Review of Systems  Constitutional: Negative.   HENT: Negative.   Eyes: Negative.   Respiratory: Negative.   Cardiovascular: Negative.   Gastrointestinal: Negative.   Genitourinary: Positive for vaginal discharge.       Vaginal discharge  Musculoskeletal: Negative.   Skin: Negative.   Neurological: Negative.   Psychiatric/Behavioral: Negative.    Physical Exam   Blood pressure 123/70, pulse 80, temperature 99 F (37.2 C), temperature source Oral, resp. rate 18, height 5\' 2"  (1.575 m), weight 94.518 kg (208 lb 6 oz).  Physical Exam  Constitutional: She is oriented to person, place, and time. She appears well-developed and well-nourished.  HENT:  Head: Normocephalic and atraumatic.  GI: Soft. Bowel sounds are normal. She exhibits no distension. There is no tenderness. There is no rebound and no guarding.  Genitourinary:  Genital:external negative Vaginal:small amount white discharge Cervix:closed/ thick Bimanual:nontender   Musculoskeletal: Normal range of motion.  Neurological: She is alert  and oriented to person, place, and time.  Skin: Skin is warm and dry.  Psychiatric: She has a normal mood and affect. Her behavior is normal. Judgment and thought content normal.   Results for orders placed during the hospital encounter of 08/04/14 (from the past 24 hour(s))  URINALYSIS, ROUTINE W REFLEX MICROSCOPIC     Status: Abnormal   Collection Time    08/04/14  9:50 PM      Result Value Ref Range   Color, Urine YELLOW  YELLOW   APPearance CLOUDY (*) CLEAR   Specific Gravity, Urine 1.010  1.005 - 1.030   pH 7.5  5.0 - 8.0   Glucose, UA NEGATIVE  NEGATIVE mg/dL   Hgb urine dipstick NEGATIVE  NEGATIVE   Bilirubin  Urine NEGATIVE  NEGATIVE   Ketones, ur NEGATIVE  NEGATIVE mg/dL   Protein, ur NEGATIVE  NEGATIVE mg/dL   Urobilinogen, UA 1.0  0.0 - 1.0 mg/dL   Nitrite NEGATIVE  NEGATIVE   Leukocytes, UA NEGATIVE  NEGATIVE  POCT PREGNANCY, URINE     Status: None   Collection Time    08/04/14  9:59 PM      Result Value Ref Range   Preg Test, Ur NEGATIVE  NEGATIVE  WET PREP, GENITAL     Status: Abnormal   Collection Time    08/04/14 11:30 PM      Result Value Ref Range   Yeast Wet Prep HPF POC FEW (*) NONE SEEN   Trich, Wet Prep NONE SEEN  NONE SEEN   Clue Cells Wet Prep HPF POC MODERATE (*) NONE SEEN   WBC, Wet Prep HPF POC FEW (*) NONE SEEN     MAU Course  Procedures  MDM Wet prep/ GC/Chlamydia  Assessment and Plan  A : Bacterial vaginosis  P: Flagyl 500 mg PO BID x 7 days No alcohol or intercourse  Diflucan 150 mg po Follow up with GYN Return to MAU for emergencies  Georgia Duff 08/04/2014, 11:34 PM

## 2014-08-05 MED ORDER — FLUCONAZOLE 150 MG PO TABS: 150.0000 mg | ORAL_TABLET | Freq: Every day | ORAL | Status: DC

## 2014-08-05 MED ORDER — METRONIDAZOLE 500 MG PO TABS: 500.0000 mg | ORAL_TABLET | Freq: Two times a day (BID) | ORAL | Status: DC

## 2014-08-05 NOTE — Discharge Instructions (Signed)
Bacterial Vaginosis Bacterial vaginosis is a vaginal infection that occurs when the normal balance of bacteria in the vagina is disrupted. It results from an overgrowth of certain bacteria. This is the most common vaginal infection in women of childbearing age. Treatment is important to prevent complications, especially in pregnant women, as it can cause a premature delivery. CAUSES  Bacterial vaginosis is caused by an increase in harmful bacteria that are normally present in smaller amounts in the vagina. Several different kinds of bacteria can cause bacterial vaginosis. However, the reason that the condition develops is not fully understood. RISK FACTORS Certain activities or behaviors can put you at an increased risk of developing bacterial vaginosis, including:  Having a new sex partner or multiple sex partners.  Douching.  Using an intrauterine device (IUD) for contraception. Women do not get bacterial vaginosis from toilet seats, bedding, swimming pools, or contact with objects around them. SIGNS AND SYMPTOMS  Some women with bacterial vaginosis have no signs or symptoms. Common symptoms include:  Grey vaginal discharge.  A fishlike odor with discharge, especially after sexual intercourse.  Itching or burning of the vagina and vulva.  Burning or pain with urination. DIAGNOSIS  Your health care provider will take a medical history and examine the vagina for signs of bacterial vaginosis. A sample of vaginal fluid may be taken. Your health care provider will look at this sample under a microscope to check for bacteria and abnormal cells. A vaginal pH test may also be done.  TREATMENT  Bacterial vaginosis may be treated with antibiotic medicines. These may be given in the form of a pill or a vaginal cream. A second round of antibiotics may be prescribed if the condition comes back after treatment.  HOME CARE INSTRUCTIONS   Only take over-the-counter or prescription medicines as  directed by your health care provider.  If antibiotic medicine was prescribed, take it as directed. Make sure you finish it even if you start to feel better.  Do not have sex until treatment is completed.  Tell all sexual partners that you have a vaginal infection. They should see their health care provider and be treated if they have problems, such as a mild rash or itching.  Practice safe sex by using condoms and only having one sex partner. SEEK MEDICAL CARE IF:   Your symptoms are not improving after 3 days of treatment.  You have increased discharge or pain.  You have a fever. MAKE SURE YOU:   Understand these instructions.  Will watch your condition.  Will get help right away if you are not doing well or get worse. FOR MORE INFORMATION  Centers for Disease Control and Prevention, Division of STD Prevention: www.cdc.gov/std American Sexual Health Association (ASHA): www.ashastd.org  Document Released: 11/11/2005 Document Revised: 09/01/2013 Document Reviewed: 06/23/2013 [employer] Patient Information 2015 [employer]. This information is not intended to replace advice given to you by your health care provider. Make sure you discuss any questions you have with your health care provider.  

## 2014-08-06 LAB — GC/CHLAMYDIA PROBE AMP
CT Probe RNA: NEGATIVE
GC PROBE AMP APTIMA: NEGATIVE

## 2014-08-12 ENCOUNTER — Telehealth: Payer: Self-pay | Admitting: *Deleted

## 2014-08-12 NOTE — Telephone Encounter (Addendum)
Pt left message stating that she was seen in MAU last week and given treatment for BV and Yeast. She took the Diflucan immediately and now that she has finished the flagyl, she still has sx of yeast infection. She would like another Diflucan.  I returned pt's call and informed her that a refill was ordered for her at the time of the original order. Pt voiced understanding and agreed to call her pharmacy now to be sure it is available for her. If not, she will call back before 1200 today.

## 2014-08-15 ENCOUNTER — Ambulatory Visit: Payer: Medicaid Other | Attending: Internal Medicine

## 2014-08-17 ENCOUNTER — Telehealth: Payer: Self-pay | Admitting: *Deleted

## 2014-08-17 MED ORDER — TERCONAZOLE 80 MG VA SUPP: 80.0000 mg | Freq: Every day | VAGINAL | Status: DC

## 2014-08-17 NOTE — Addendum Note (Signed)
Addended by: Langston Reusing on: 08/17/2014 12:06 PM   Modules accepted: Orders

## 2014-08-17 NOTE — Telephone Encounter (Addendum)
Pt left message stating that she was recently treated for BV and yeast infection last week. She had to take a second dose of fluconazole for the yeast on 9/21.  The problem has not gotten better, instead it is worse. Now in addition to the thick white d/c, she is having itching as well. She wants to know if there is a suppository or something that would work better.  I returned pt's call and discussed her concern in detail. I obtained Rx order from Allie Dimmer, PA-C for Terazol suppository.  Pt was informed and voiced understanding.  62  Pt called back and stated that her pharmacy does not carry the prescribed medication - Terazol supp.  I offered to send to an alternate pharmacy and she requested to have it sent to [facility] on [address]. Rx sent as requested. Pt voiced understanding.

## 2014-08-25 ENCOUNTER — Telehealth: Payer: Self-pay | Admitting: *Deleted

## 2014-08-25 DIAGNOSIS — B3731 Acute candidiasis of vulva and vagina: Secondary | ICD-10-CM

## 2014-08-25 DIAGNOSIS — B373 Candidiasis of vulva and vagina: Secondary | ICD-10-CM

## 2014-08-25 MED ORDER — FLUCONAZOLE 150 MG PO TABS
150.0000 mg | ORAL_TABLET | Freq: Every day | ORAL | Status: DC
Start: 2014-08-25 — End: 2014-09-07

## 2014-08-25 NOTE — Telephone Encounter (Signed)
Patient states that she was treated for a yeast infection last week with a vaginal suppository but doesn't think that it worked well because she started her period. She would like to have an rx for diflucan called in to her pharmacy. Rx sent in per protocol.

## 2014-08-26 ENCOUNTER — Ambulatory Visit: Payer: Self-pay | Attending: Internal Medicine

## 2014-09-05 ENCOUNTER — Encounter: Payer: Self-pay | Admitting: *Deleted

## 2014-09-05 ENCOUNTER — Telehealth: Payer: Self-pay | Admitting: *Deleted

## 2014-09-05 NOTE — Telephone Encounter (Signed)
93 autopsy results final.  Phoned patient.  Left message that I was trying to reach her regarding some results.  Asked she call our office back.  Patient needs to be informed that autopsy on female born on 06/17/14 showed no abnormalities.  Copy of autopsy scanned under media tab.

## 2014-09-06 ENCOUNTER — Telehealth: Payer: Self-pay | Admitting: General Practice

## 2014-09-06 NOTE — Telephone Encounter (Signed)
Patient called and left message stating she is returning our call for results. Called patient and informed her of results. Patient verbalized understanding and had no questions

## 2014-09-07 ENCOUNTER — Encounter (HOSPITAL_COMMUNITY): Payer: Self-pay | Admitting: Emergency Medicine

## 2014-09-07 ENCOUNTER — Emergency Department (HOSPITAL_COMMUNITY)
Admission: EM | Admit: 2014-09-07 | Discharge: 2014-09-07 | Discharge status: Against Medical Advice | Payer: Medicaid Other | Attending: Emergency Medicine | Admitting: Emergency Medicine

## 2014-09-07 DIAGNOSIS — S6991XA Unspecified injury of right wrist, hand and finger(s), initial encounter: Secondary | ICD-10-CM | POA: Insufficient documentation

## 2014-09-07 DIAGNOSIS — R350 Frequency of micturition: Secondary | ICD-10-CM | POA: Insufficient documentation

## 2014-09-07 DIAGNOSIS — Y9389 Activity, other specified: Secondary | ICD-10-CM | POA: Insufficient documentation

## 2014-09-07 DIAGNOSIS — J45909 Unspecified asthma, uncomplicated: Secondary | ICD-10-CM | POA: Insufficient documentation

## 2014-09-07 DIAGNOSIS — Z8751 Personal history of pre-term labor: Secondary | ICD-10-CM | POA: Insufficient documentation

## 2014-09-07 DIAGNOSIS — Z87440 Personal history of urinary (tract) infections: Secondary | ICD-10-CM | POA: Insufficient documentation

## 2014-09-07 DIAGNOSIS — X58XXXA Exposure to other specified factors, initial encounter: Secondary | ICD-10-CM | POA: Insufficient documentation

## 2014-09-07 DIAGNOSIS — Z87891 Personal history of nicotine dependence: Secondary | ICD-10-CM | POA: Insufficient documentation

## 2014-09-07 DIAGNOSIS — Y9289 Other specified places as the place of occurrence of the external cause: Secondary | ICD-10-CM | POA: Insufficient documentation

## 2014-09-07 DIAGNOSIS — Z3202 Encounter for pregnancy test, result negative: Secondary | ICD-10-CM | POA: Insufficient documentation

## 2014-09-07 LAB — COMPREHENSIVE METABOLIC PANEL
ALBUMIN: 4.1 g/dL (ref 3.5–5.2)
ALT: 13 U/L (ref 0–35)
AST: 18 U/L (ref 0–37)
Alkaline Phosphatase: 44 U/L (ref 39–117)
Anion gap: 14 (ref 5–15)
BUN: 9 mg/dL (ref 6–23)
CALCIUM: 9.7 mg/dL (ref 8.4–10.5)
CO2: 20 meq/L (ref 19–32)
Chloride: 104 mEq/L (ref 96–112)
Creatinine, Ser: 0.91 mg/dL (ref 0.50–1.10)
GFR calc Af Amer: >90 mL/min (ref >90)
GFR calc non Af Amer: 88 mL/min — ABNORMAL LOW (ref >90)
Glucose, Bld: 88 mg/dL (ref 70–99)
Potassium: 4.4 mEq/L (ref 3.7–5.3)
SODIUM: 138 meq/L (ref 137–147)
Total Bilirubin: 0.4 mg/dL (ref 0.3–1.2)
Total Protein: 8.2 g/dL (ref 6.0–8.3)

## 2014-09-07 LAB — URINALYSIS, ROUTINE W REFLEX MICROSCOPIC
Bilirubin Urine: NEGATIVE
GLUCOSE, UA: NEGATIVE mg/dL
Ketones, ur: NEGATIVE mg/dL
Leukocytes, UA: NEGATIVE
Nitrite: NEGATIVE
Protein, ur: 100 mg/dL — AB
Specific Gravity, Urine: 1.023 (ref 1.005–1.030)
Urobilinogen, UA: 1 mg/dL (ref 0.0–1.0)
pH: 6 (ref 5.0–8.0)

## 2014-09-07 LAB — CBC WITH DIFFERENTIAL/PLATELET
BASOS PCT: 0 % (ref 0–1)
Basophils Absolute: 0 10*3/uL (ref 0.0–0.1)
Eosinophils Absolute: 0 10*3/uL (ref 0.0–0.7)
Eosinophils Relative: 0 % (ref 0–5)
HCT: 41.8 % (ref 36.0–46.0)
HEMOGLOBIN: 15.1 g/dL — AB (ref 12.0–15.0)
LYMPHS ABS: 1.4 10*3/uL (ref 0.7–4.0)
Lymphocytes Relative: 12 % (ref 12–46)
MCH: 33 pg (ref 26.0–34.0)
MCHC: 36.1 g/dL — AB (ref 30.0–36.0)
MCV: 91.5 fL (ref 78.0–100.0)
MONOS PCT: 4 % (ref 3–12)
Monocytes Absolute: 0.5 10*3/uL (ref 0.1–1.0)
NEUTROS ABS: 9.5 10*3/uL — AB (ref 1.7–7.7)
NEUTROS PCT: 84 % — AB (ref 43–77)
PLATELETS: 267 10*3/uL (ref 150–400)
RBC: 4.57 MIL/uL (ref 3.87–5.11)
RDW: 12.9 % (ref 11.5–15.5)
WBC: 11.5 10*3/uL — ABNORMAL HIGH (ref 4.0–10.5)

## 2014-09-07 LAB — URINE MICROSCOPIC-ADD ON

## 2014-09-07 LAB — POC URINE PREG, ED: Preg Test, Ur: NEGATIVE

## 2014-09-07 MED ORDER — LIDOCAINE VISCOUS 2 % MT SOLN: 15.0000 mL | Freq: Once | OROMUCOSAL | Status: DC

## 2014-09-07 MED ORDER — LIDOCAINE HCL 2 % IJ SOLN
10.0000 mL | Freq: Once | INTRAMUSCULAR | Status: AC
  Administered 2014-09-07: 10 mL via INTRADERMAL
  Filled 2014-09-07: qty 20

## 2014-09-07 MED ORDER — LIDOCAINE HCL (PF) 2 % IJ SOLN
10.0000 mL | Freq: Once | INTRAMUSCULAR | Status: AC
  Administered 2014-09-07: 10 mL
  Filled 2014-09-07: qty 10

## 2014-09-07 NOTE — ED Provider Notes (Signed)
Medical screening examination/treatment/procedure(s) were performed by non-physician practitioner and as supervising physician I was immediately available for consultation/collaboration.   EKG Interpretation None       Orlie Dakin, MD 09/07/14 [id number]
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
## 2014-09-07 NOTE — ED Notes (Signed)
Pt wanting to leave AMA.  Provider made aware.  Stated pt could leave.  E-signature obtained for AMA.

## 2014-09-07 NOTE — ED Notes (Signed)
While RN was applying finger splint to finger, pt stating that she was wanting to leave. This RN explained to pt that due to pt mentioning abdominal pain and urinary frequency that further work up would be needed. Pt stating "I did not know that was going to happen.  I just wanted to get by urine checked for a bladder infection.  I did not want the blood work.  Can you guys just call me back with the results?"  RN explained to pt the process and that that would be a possibility.  Explained that pt could wait for further evaluation or could leave AMA if she did not want to stay to be examined by a provider.  Pt stated she would stay longer.

## 2014-09-07 NOTE — ED Notes (Signed)
Pt c/o urinary frequency and lower abdominal pain x3 days. Pt denies hematuria, bloody or dark stool.
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
## 2014-09-07 NOTE — ED Provider Notes (Signed)
CSN: [id number]     Arrival date & time 09/07/14  1500 History   First MD Initiated Contact with Patient 09/07/14 1519     Chief Complaint  Patient presents with  . Finger Injury  . Abdominal Pain   (Consider location/radiation/quality/duration/timing/severity/associated sxs/prior Treatment) HPI Kim Johns is a 23 yo female presenting with report of urinary frequency x 2 days.  She was originally seen in the fast track for a finger complaint and then reported the urinary complaint and was moved to the acute department.  She reports since having a baby she tends to get UTIs frequently  But she generally does not have dysuria only urinary frequency.  She began having urinary frequency 2 days ago, but denies fever, chills, abd pain, vaginal pain, dysuria, pain with intercourse, or vaginal discharge.  Past Medical History  Diagnosis Date  . Allergy   . Asthma     albuterol last used a few months ago  . Preterm labor    Past Surgical History  Procedure Laterality Date  . Wisdom tooth extraction     Family History  Problem Relation Age of Onset  . Diabetes Mother   . Stroke Mother   . Hypertension Father    History  Substance Use Topics  . Smoking status: Former Smoker -- 0.50 packs/day for 10 years    Types: Cigarettes    Quit date: 02/22/2014  . Smokeless tobacco: Never Used  . Alcohol Use: No   OB History   Grav Para Term Preterm Abortions TAB SAB Ect Mult Living   1 0   1 0 0 0 0 0     Review of Systems  Constitutional: Negative for fever and chills.  HENT: Negative for sore throat.   Eyes: Negative for visual disturbance.  Respiratory: Negative for cough and shortness of breath.   Cardiovascular: Negative for chest pain and leg swelling.  Gastrointestinal: Negative for nausea, vomiting and diarrhea.  Genitourinary: Positive for frequency. Negative for dysuria, urgency, vaginal discharge and vaginal pain.  Musculoskeletal: Negative for myalgias.  Skin: Negative  for rash.  Neurological: Negative for weakness, numbness and headaches.   Allergies  Shellfish allergy; Sulfa antibiotics; and Icy hot  Home Medications   Prior to Admission medications   Not on File   BP 129/68  Pulse 98  Temp(Src) 99.3 F (37.4 C) (Oral)  Resp 18  Ht 5\' 3"  (1.6 m)  Wt 205 lb (92.987 kg)  BMI 36.32 kg/m2  SpO2 100%  LMP 08/17/2014 Physical Exam  Nursing note and vitals reviewed. Constitutional: She appears well-developed and well-nourished. No distress.  HENT:  Head: Normocephalic and atraumatic.  Mouth/Throat: Oropharynx is clear and moist. No oropharyngeal exudate.  Eyes: Conjunctivae are normal.  Neck: Neck supple. No thyromegaly present.  Cardiovascular: Normal rate, regular rhythm and intact distal pulses.   Pulmonary/Chest: Effort normal and breath sounds normal. No respiratory distress. She has no wheezes. She has no rales. She exhibits no tenderness.  Abdominal: Soft. She exhibits no distension and no mass. There is no hepatosplenomegaly. There is no tenderness. There is no rigidity, no rebound, no guarding, no CVA tenderness, no tenderness at McBurney's point and negative Murphy's sign.  Musculoskeletal: She exhibits no tenderness.  Dressing to rt middle finger  Lymphadenopathy:    She has no cervical adenopathy.  Neurological: She is alert.  Skin: Skin is warm and dry. No rash noted. She is not diaphoretic.  Psychiatric: She has a normal mood and affect.  ED Course  Procedures (including critical care time) Labs Review Labs Reviewed  CBC WITH DIFFERENTIAL - Abnormal; Notable for the following:    WBC 11.5 (*)    Hemoglobin 15.1 (*)    MCHC 36.1 (*)    Neutrophils Relative % 84 (*)    Neutro Abs 9.5 (*)    All other components within normal limits  URINALYSIS, ROUTINE W REFLEX MICROSCOPIC - Abnormal; Notable for the following:    APPearance HAZY (*)    Hgb urine dipstick TRACE (*)    Protein, ur 100 (*)    All other components  within normal limits  COMPREHENSIVE METABOLIC PANEL - Abnormal; Notable for the following:    GFR calc non Af Amer 88 (*)    All other components within normal limits  URINE MICROSCOPIC-ADD ON - Abnormal; Notable for the following:    Squamous Epithelial / LPF FEW (*)    Bacteria, UA FEW (*)    Casts HYALINE CASTS (*)    All other components within normal limits  POC URINE PREG, ED   Imaging Review No results found.   EKG Interpretation None      MDM   Final diagnoses:  Finger injury, right, initial encounter   5:15PM  23 yo female with finger injury repaired in fast track and urinary frequency, denies any abd pain or discharge. Protocol labs drawn prior to my eval.  Awaiting UA, and xray ordered from fast track.  Pt anxious to leave.  She reports having a long day and wants to leave as soon as possible.    5:35PM  RN made me aware, pt did not want to wait any longer and wanted to leave AMA.  Xray and UA not resulted at this time.  Pt appears reasonable and competent to make decisions.  Pt does not appear to have a life-threatening illness or injury.    Filed Vitals:   09/07/14 1509  BP: 129/68  Pulse: 98  Temp: 99.3 F (37.4 C)  TempSrc: Oral  Resp: 18  Height: 5\' 3"  (1.6 m)  Weight: 205 lb (92.987 kg)  SpO2: 100%    Meds given in ED:  Medications  lidocaine (XYLOCAINE) 2 % injection 10 mL (10 mLs Infiltration Given 09/07/14 1549)  lidocaine (XYLOCAINE) 2 % (with pres) injection 200 mg (10 mLs Intradermal Given 09/07/14 1625)    There are no discharge medications for this patient.      Britt Bottom, NP 09/08/14 1213

## 2014-09-08 NOTE — ED Provider Notes (Signed)
Medical screening examination/treatment/procedure(s) were performed by non-physician practitioner and as supervising physician I was immediately available for consultation/collaboration.   EKG Interpretation None       Orlie Dakin, MD 09/08/14 2334

## 2014-09-26 ENCOUNTER — Encounter (HOSPITAL_COMMUNITY): Payer: Self-pay | Admitting: Emergency Medicine

## 2014-10-03 ENCOUNTER — Ambulatory Visit: Payer: Medicaid Other | Attending: Internal Medicine | Admitting: Internal Medicine

## 2014-10-03 ENCOUNTER — Encounter: Payer: Self-pay | Admitting: Internal Medicine

## 2014-10-03 VITALS — BP 120/73 | HR 81 | Temp 98.5°F | Resp 16 | Wt 207.0 lb

## 2014-10-03 DIAGNOSIS — F17200 Nicotine dependence, unspecified, uncomplicated: Secondary | ICD-10-CM

## 2014-10-03 DIAGNOSIS — Z720 Tobacco use: Secondary | ICD-10-CM

## 2014-10-03 DIAGNOSIS — F17210 Nicotine dependence, cigarettes, uncomplicated: Secondary | ICD-10-CM | POA: Insufficient documentation

## 2014-10-03 DIAGNOSIS — Z2821 Immunization not carried out because of patient refusal: Secondary | ICD-10-CM | POA: Insufficient documentation

## 2014-10-03 DIAGNOSIS — J45909 Unspecified asthma, uncomplicated: Secondary | ICD-10-CM | POA: Insufficient documentation

## 2014-10-03 DIAGNOSIS — K029 Dental caries, unspecified: Secondary | ICD-10-CM | POA: Insufficient documentation

## 2014-10-03 NOTE — Progress Notes (Signed)
Patient states here for dental referral She states she has some cavities and has not seen a dentist in a while

## 2014-10-03 NOTE — Patient Instructions (Signed)
Smoking Cessation Quitting smoking is important to your health and has many advantages. However, it is not always easy to quit since nicotine is a very addictive drug. Oftentimes, people try 3 times or more before being able to quit. This document explains the best ways for you to prepare to quit smoking. Quitting takes hard work and a lot of effort, but you can do it. ADVANTAGES OF QUITTING SMOKING  You will live longer, feel better, and live better.  Your body will feel the impact of quitting smoking almost immediately.  Within 20 minutes, blood pressure decreases. Your pulse returns to its normal level.  After 8 hours, carbon monoxide levels in the blood return to normal. Your oxygen level increases.  After 24 hours, the chance of having a heart attack starts to decrease. Your breath, hair, and body stop smelling like smoke.  After 48 hours, damaged nerve endings begin to recover. Your sense of taste and smell improve.  After 72 hours, the body is virtually free of nicotine. Your bronchial tubes relax and breathing becomes easier.  After 2 to 12 weeks, lungs can hold more air. Exercise becomes easier and circulation improves.  The risk of having a heart attack, stroke, cancer, or lung disease is greatly reduced.  After 1 year, the risk of coronary heart disease is cut in half.  After 5 years, the risk of stroke falls to the same as a nonsmoker.  After 10 years, the risk of lung cancer is cut in half and the risk of other cancers decreases significantly.  After 15 years, the risk of coronary heart disease drops, usually to the level of a nonsmoker.  If you are pregnant, quitting smoking will improve your chances of having a healthy baby.  The people you live with, especially any children, will be healthier.  You will have extra money to spend on things other than cigarettes. QUESTIONS TO THINK ABOUT BEFORE ATTEMPTING TO QUIT You may want to talk about your answers with your  health care provider.  Why do you want to quit?  If you tried to quit in the past, what helped and what did not?  What will be the most difficult situations for you after you quit? How will you plan to handle them?  Who can help you through the tough times? Your family? Friends? A health care provider?  What pleasures do you get from smoking? What ways can you still get pleasure if you quit? Here are some questions to ask your health care provider:  How can you help me to be successful at quitting?  What medicine do you think would be best for me and how should I take it?  What should I do if I need more help?  What is smoking withdrawal like? How can I get information on withdrawal? GET READY  Set a quit date.  Change your environment by getting rid of all cigarettes, ashtrays, matches, and lighters in your home, car, or work. Do not let people smoke in your home.  Review your past attempts to quit. Think about what worked and what did not. GET SUPPORT AND ENCOURAGEMENT You have a better chance of being successful if you have help. You can get support in many ways.  Tell your family, friends, and coworkers that you are going to quit and need their support. Ask them not to smoke around you.  Get individual, group, or telephone counseling and support. Programs are available at local hospitals and health centers. Call   your local health department for information about programs in your area.  Spiritual beliefs and practices may help some smokers quit.  Download a "quit meter" on your computer to keep track of quit statistics, such as how long you have gone without smoking, cigarettes not smoked, and money saved.  Get a self-help book about quitting smoking and staying off tobacco. LEARN NEW SKILLS AND BEHAVIORS  Distract yourself from urges to smoke. Talk to someone, go for a walk, or occupy your time with a task.  Change your normal routine. Take a different route to work.  Drink tea instead of coffee. Eat breakfast in a different place.  Reduce your stress. Take a hot bath, exercise, or read a book.  Plan something enjoyable to do every day. Reward yourself for not smoking.  Explore interactive web-based programs that specialize in helping you quit. GET MEDICINE AND USE IT CORRECTLY Medicines can help you stop smoking and decrease the urge to smoke. Combining medicine with the above behavioral methods and support can greatly increase your chances of successfully quitting smoking.  Nicotine replacement therapy helps deliver nicotine to your body without the negative effects and risks of smoking. Nicotine replacement therapy includes nicotine gum, lozenges, inhalers, nasal sprays, and skin patches. Some may be available over-the-counter and others require a prescription.  Antidepressant medicine helps people abstain from smoking, but how this works is unknown. This medicine is available by prescription.  Nicotinic receptor partial agonist medicine simulates the effect of nicotine in your brain. This medicine is available by prescription. Ask your health care provider for advice about which medicines to use and how to use them based on your health history. Your health care provider will tell you what side effects to look out for if you choose to be on a medicine or therapy. Carefully read the information on the package. Do not use any other product containing nicotine while using a nicotine replacement product.  RELAPSE OR DIFFICULT SITUATIONS Most relapses occur within the first 3 months after quitting. Do not be discouraged if you start smoking again. Remember, most people try several times before finally quitting. You may have symptoms of withdrawal because your body is used to nicotine. You may crave cigarettes, be irritable, feel very hungry, cough often, get headaches, or have difficulty concentrating. The withdrawal symptoms are only temporary. They are strongest  when you first quit, but they will go away within 10-14 days. To reduce the chances of relapse, try to:  Avoid drinking alcohol. Drinking lowers your chances of successfully quitting.  Reduce the amount of caffeine you consume. Once you quit smoking, the amount of caffeine in your body increases and can give you symptoms, such as a rapid heartbeat, sweating, and anxiety.  Avoid smokers because they can make you want to smoke.  Do not let weight gain distract you. Many smokers will gain weight when they quit, usually less than 10 pounds. Eat a healthy diet and stay active. You can always lose the weight gained after you quit.  Find ways to improve your mood other than smoking. FOR MORE INFORMATION  www.smokefree.gov  Document Released: 11/05/2001 Document Revised: 03/28/2014 Document Reviewed: 02/20/2012 [employer] Patient Information 2015 [employer]. This information is not intended to replace advice given to you by your health care provider. Make sure you discuss any questions you have with your health care provider.  

## 2014-10-03 NOTE — Progress Notes (Signed)
MRN: [id number] Name: Kim Johns  Sex: female Age: 23 y.o. DOB: 1991-07-26  Allergies: Shellfish allergy; Sulfa antibiotics; and Icy hot  Chief Complaint  Patient presents with  . dental referral    HPI: Patient is 23 y.o. female who Comes today reported to have lot of dental cavities, requesting referral to see a dentist as per patient she saw the dentist several years ago and needs her teeth cleaned, denies any pain fever chills. Patient does smoke cigarettes, I have advised patient to quit smoking, she declines flu shot.  Past Medical History  Diagnosis Date  . Allergy   . Asthma     albuterol last used a few months ago  . Preterm labor     Past Surgical History  Procedure Laterality Date  . Wisdom tooth extraction        Medication List    Notice  As of 10/03/2014 12:14 PM   You have not been prescribed any medications.      No orders of the defined types were placed in this encounter.    There is no immunization history for the selected administration types on file for this patient.  Family History  Problem Relation Age of Onset  . Diabetes Mother   . Stroke Mother   . Hypertension Father     History  Substance Use Topics  . Smoking status: Former Smoker -- 0.50 packs/day for 10 years    Types: Cigarettes    Quit date: 02/22/2014  . Smokeless tobacco: Never Used  . Alcohol Use: No    Review of Systems   As noted in HPI  Filed Vitals:   10/03/14 1206  BP: 120/73  Pulse: 81  Temp: 98.5 F (36.9 C)  Resp: 16    Physical Exam  Physical Exam  HENT:  Dental cavities  Cardiovascular: Normal rate and regular rhythm.   Pulmonary/Chest: Breath sounds normal. No respiratory distress. She has no wheezes. She has no rales.    CBC    Component Value Date/Time   WBC 11.5* 09/07/2014 1657   RBC 4.57 09/07/2014 1657   HGB 15.1* 09/07/2014 1657   HCT 41.8 09/07/2014 1657   PLT 267 09/07/2014 1657   MCV 91.5 09/07/2014 1657   LYMPHSABS  1.4 09/07/2014 1657   MONOABS 0.5 09/07/2014 1657   EOSABS 0.0 09/07/2014 1657   BASOSABS 0.0 09/07/2014 1657    CMP     Component Value Date/Time   NA 138 09/07/2014 1657   K 4.4 09/07/2014 1657   CL 104 09/07/2014 1657   CO2 20 09/07/2014 1657   GLUCOSE 88 09/07/2014 1657   BUN 9 09/07/2014 1657   CREATININE 0.91 09/07/2014 1657   CREATININE 0.87 10/19/2013 1109   CALCIUM 9.7 09/07/2014 1657   PROT 8.2 09/07/2014 1657   ALBUMIN 4.1 09/07/2014 1657   AST 18 09/07/2014 1657   ALT 13 09/07/2014 1657   ALKPHOS 44 09/07/2014 1657   BILITOT 0.4 09/07/2014 1657   GFRNONAA 88* 09/07/2014 1657   GFRNONAA >89 10/19/2013 1109   GFRAA >90 09/07/2014 1657   GFRAA >89 10/19/2013 1109    Lab Results  Component Value Date/Time   CHOL 149 10/19/2013 11:09 AM    No components found for: HGA1C  Lab Results  Component Value Date/Time   AST 18 09/07/2014 04:57 PM    Assessment and Plan  Dental cavities - Plan: patient is advised for good oral hygiene brushing flossing also Ambulatory referral to Dentistry  Smoking  Advised patient to quit smoking.  Health Maintenance Patient declines flu shot    Follow up as scheduled.  Lorayne Marek, MD

## 2014-10-14 ENCOUNTER — Encounter: Payer: Self-pay | Admitting: Internal Medicine

## 2014-10-14 ENCOUNTER — Ambulatory Visit: Payer: Medicaid Other | Attending: Internal Medicine | Admitting: Internal Medicine

## 2014-10-14 VITALS — BP 128/78 | HR 75 | Temp 98.9°F | Resp 16 | Wt 207.8 lb

## 2014-10-14 DIAGNOSIS — J029 Acute pharyngitis, unspecified: Secondary | ICD-10-CM | POA: Insufficient documentation

## 2014-10-14 DIAGNOSIS — J45909 Unspecified asthma, uncomplicated: Secondary | ICD-10-CM | POA: Insufficient documentation

## 2014-10-14 DIAGNOSIS — H9202 Otalgia, left ear: Secondary | ICD-10-CM

## 2014-10-14 DIAGNOSIS — Z87891 Personal history of nicotine dependence: Secondary | ICD-10-CM | POA: Insufficient documentation

## 2014-10-14 DIAGNOSIS — H6123 Impacted cerumen, bilateral: Secondary | ICD-10-CM | POA: Insufficient documentation

## 2014-10-14 MED ORDER — AZITHROMYCIN 250 MG PO TABS: ORAL_TABLET | ORAL | Status: DC

## 2014-10-14 MED ORDER — CARBAMIDE PEROXIDE 6.5 % OT SOLN: 5.0000 [drp] | Freq: Two times a day (BID) | OTIC | Status: DC

## 2014-10-14 NOTE — Progress Notes (Signed)
MRN: [id number] Name: Kim Johns  Sex: female Age: 23 y.o. DOB: 1991/06/22  Allergies: Shellfish allergy; Sulfa antibiotics; and Icy hot  Chief Complaint  Patient presents with  . Sore Throat    HPI: Patient is 23 y.o. female who comes today reported to have sore throat left ear pain for the last 3 days and is getting worse denies any fever chills cough chest and shortness of breath does complain of some headache but denies any nasal congestion.  Past Medical History  Diagnosis Date  . Allergy   . Asthma     albuterol last used a few months ago  . Preterm labor     Past Surgical History  Procedure Laterality Date  . Wisdom tooth extraction        Medication List       This list is accurate as of: 10/14/14  5:02 PM.  Always use your most recent med list.               azithromycin 250 MG tablet  Commonly known as:  ZITHROMAX Z-PAK  Take as directed     carbamide peroxide 6.5 % otic solution  Commonly known as:  DEBROX  Place 5 drops into both ears 2 (two) times daily.        Meds ordered this encounter  Medications  . azithromycin (ZITHROMAX Z-PAK) 250 MG tablet    Sig: Take as directed    Dispense:  6 each    Refill:  0  . carbamide peroxide (DEBROX) 6.5 % otic solution    Sig: Place 5 drops into both ears 2 (two) times daily.    Dispense:  15 mL    Refill:  1    There is no immunization history for the selected administration types on file for this patient.  Family History  Problem Relation Age of Onset  . Diabetes Mother   . Stroke Mother   . Hypertension Father     History  Substance Use Topics  . Smoking status: Former Smoker -- 0.50 packs/day for 10 years    Types: Cigarettes    Quit date: 02/22/2014  . Smokeless tobacco: Never Used  . Alcohol Use: No    Review of Systems   As noted in HPI  Filed Vitals:   10/14/14 1633  BP: 128/78  Pulse: 75  Temp: 98.9 F (37.2 C)  Resp: 16    Physical Exam  Physical Exam    HENT:  Pharyngeal erythema no exudate, increased wax in both ears can not visualize the TMs   Cardiovascular: Normal rate and regular rhythm.   Pulmonary/Chest: Breath sounds normal. No respiratory distress. She has no wheezes. She has no rales.  Lymphadenopathy:    She has cervical adenopathy.    CBC    Component Value Date/Time   WBC 11.5* 09/07/2014 1657   RBC 4.57 09/07/2014 1657   HGB 15.1* 09/07/2014 1657   HCT 41.8 09/07/2014 1657   PLT 267 09/07/2014 1657   MCV 91.5 09/07/2014 1657   LYMPHSABS 1.4 09/07/2014 1657   MONOABS 0.5 09/07/2014 1657   EOSABS 0.0 09/07/2014 1657   BASOSABS 0.0 09/07/2014 1657    CMP     Component Value Date/Time   NA 138 09/07/2014 1657   K 4.4 09/07/2014 1657   CL 104 09/07/2014 1657   CO2 20 09/07/2014 1657   GLUCOSE 88 09/07/2014 1657   BUN 9 09/07/2014 1657   CREATININE 0.91 09/07/2014 1657   CREATININE  0.87 10/19/2013 1109   CALCIUM 9.7 09/07/2014 1657   PROT 8.2 09/07/2014 1657   ALBUMIN 4.1 09/07/2014 1657   AST 18 09/07/2014 1657   ALT 13 09/07/2014 1657   ALKPHOS 44 09/07/2014 1657   BILITOT 0.4 09/07/2014 1657   GFRNONAA 88* 09/07/2014 1657   GFRNONAA >89 10/19/2013 1109   GFRAA >90 09/07/2014 1657   GFRAA >89 10/19/2013 1109    Lab Results  Component Value Date/Time   CHOL 149 10/19/2013 11:09 AM    No components found for: HGA1C  Lab Results  Component Value Date/Time   AST 18 09/07/2014 04:57 PM    Assessment and Plan  Sore throat - Plan: .Rapid Strep A is negative, will send  Throat culture ([facility]), azithromycin (ZITHROMAX Z-PAK) 250 MG tablet Also advise patient for saltwater gargles.  Excess ear wax, bilateral - Plan: carbamide peroxide (DEBROX) 6.5 % otic solution  Otalgia, left Ibuprofen when necessary,     Return if symptoms worsen or fail to improve.  Lorayne Marek, MD

## 2014-10-14 NOTE — Progress Notes (Signed)
Patient complains of sore throat and left ear pain that started yesterday But has gotten worse today

## 2014-10-16 LAB — CULTURE, GROUP A STREP: Organism ID, Bacteria: NORMAL

## 2014-10-27 ENCOUNTER — Inpatient Hospital Stay (HOSPITAL_COMMUNITY)
Admission: AD | Admit: 2014-10-27 | Discharge: 2014-10-28 | Disposition: A | Discharge status: Home / Self Care | Payer: Medicaid Other | Source: Ambulatory Visit | Attending: Obstetrics & Gynecology | Admitting: Obstetrics & Gynecology

## 2014-10-27 ENCOUNTER — Encounter (HOSPITAL_COMMUNITY): Payer: Self-pay | Admitting: *Deleted

## 2014-10-27 DIAGNOSIS — N760 Acute vaginitis: Secondary | ICD-10-CM | POA: Insufficient documentation

## 2014-10-27 DIAGNOSIS — N898 Other specified noninflammatory disorders of vagina: Secondary | ICD-10-CM | POA: Insufficient documentation

## 2014-10-27 DIAGNOSIS — B9689 Other specified bacterial agents as the cause of diseases classified elsewhere: Secondary | ICD-10-CM | POA: Insufficient documentation

## 2014-10-27 DIAGNOSIS — Z87891 Personal history of nicotine dependence: Secondary | ICD-10-CM | POA: Insufficient documentation

## 2014-10-27 NOTE — MAU Provider Note (Signed)
Chief Complaint: Vaginal Discharge   First Provider Initiated Contact with Patient 10/27/14 2334     SUBJECTIVE HPI: Kim Johns is a 23 y.o. G48P0100 female who presents with vaginal discharge since taking an antibiotic 2 weeks ago. Initially had symptoms of yeast infection. Was prescribed a suppository. Itching resolved, she is continuing to have heavy discharge with odor now. Denies fever, chills, abdominal pain. States she is in a mutually monogamous relationship 1 year.  Past Medical History  Diagnosis Date  . Allergy   . Asthma     albuterol last used a few months ago  . Preterm labor    OB History  Gravida Para Term Preterm AB SAB TAB Ectopic Multiple Living  1 1  1  0 0 0 0 0 0    # Outcome Date GA Lbr Len/2nd Weight Sex Delivery Anes PTL Lv  1 Preterm 06/17/14 [redacted]w[redacted]d 03:59 0 kg (0 lb) F  None  FD     Complications: Premature rupture of membranes in second trimester     Past Surgical History  Procedure Laterality Date  . Wisdom tooth extraction     History   Social History  . Marital Status: Single    Spouse Name: N/A    Number of Children: N/A  . Years of Education: N/A   Occupational History  . Not on file.   Social History Main Topics  . Smoking status: Former Smoker -- 0.50 packs/day for 10 years    Types: Cigarettes    Quit date: 02/22/2014  . Smokeless tobacco: Never Used  . Alcohol Use: No  . Drug Use: No  . Sexual Activity: Not Currently   Other Topics Concern  . Not on file   Social History Narrative   No current facility-administered medications on file prior to encounter.   Current Outpatient Prescriptions on File Prior to Encounter  Medication Sig Dispense Refill  . azithromycin (ZITHROMAX Z-PAK) 250 MG tablet Take as directed 6 each 0  . carbamide peroxide (DEBROX) 6.5 % otic solution Place 5 drops into both ears 2 (two) times daily. 15 mL 1   Allergies  Allergen Reactions  . Shellfish Allergy Hives  . Sulfa Antibiotics Other (See  Comments)    Had since childhood not sure of reaction  . Icy Hot Rash    ROS: Pertinent positive items in HPI. Negative for fever, chills, abdominal pain, intermenstrual bleeding, dyspareunia, vaginal itching.  OBJECTIVE Blood pressure 136/78, pulse 78, temperature 98 F (36.7 C), temperature source Oral, resp. rate 18, last menstrual period 10/10/2014. GENERAL: Well-developed, well-nourished female in no acute distress.  HEENT: Normocephalic HEART: normal rate RESP: normal effort ABDOMEN: Soft, non-tender EXTREMITIES: Nontender, no edema NEURO: Alert and oriented SPECULUM EXAM: NEFG, moderate amount of creamy, white, malodorous discharge, no blood noted, cervix clean BIMANUAL: cervix closed; uterus normal size, no adnexal tenderness or masses. No cervical motion tenderness.  LAB RESULTS Results for orders placed or performed during the hospital encounter of 10/27/14 (from the past 24 hour(s))  Wet prep, genital     Status: Abnormal   Collection Time: 10/27/14 11:45 PM  Result Value Ref Range   Yeast Wet Prep HPF POC NONE SEEN NONE SEEN   Trich, Wet Prep NONE SEEN NONE SEEN   Clue Cells Wet Prep HPF POC MODERATE (A) NONE SEEN   WBC, Wet Prep HPF POC FEW (A) NONE SEEN    IMAGING No results found.  MAU COURSE  ASSESSMENT 1. BV (bacterial vaginosis)  PLAN Discharge home in stable condition. No Alcohol or intercourse while taking Flagyl. Gonorrhea/Chlamydia cultures pending.     Follow-up Information    Follow up with [facility].   Specialty:  Obstetrics and Gynecology   Why:  As needed if symptoms worsen   Contact information:   [address] [place] [phone]      Follow up with [facility].   Why:  As needed in emergencies   Contact information:   [address] [id number] [place] [phone]       Medication List    STOP  taking these medications        azithromycin 250 MG tablet  Commonly known as:  ZITHROMAX Z-PAK      TAKE these medications        carbamide peroxide 6.5 % otic solution  Commonly known as:  DEBROX  Place 5 drops into both ears 2 (two) times daily.     metroNIDAZOLE 500 MG tablet  Commonly known as:  FLAGYL  Take 1 tablet (500 mg total) by mouth 2 (two) times daily.       [place], [place] 10/28/2014  12:24 AM

## 2014-10-27 NOTE — MAU Note (Signed)
Pt states she was taking an antibiotic first and then pt got yeast infection and then she was given a suppository. Pt states  she has discharge

## 2014-10-28 DIAGNOSIS — A499 Bacterial infection, unspecified: Secondary | ICD-10-CM

## 2014-10-28 DIAGNOSIS — N760 Acute vaginitis: Secondary | ICD-10-CM

## 2014-10-28 LAB — WET PREP, GENITAL
TRICH WET PREP: NONE SEEN
Yeast Wet Prep HPF POC: NONE SEEN

## 2014-10-28 MED ORDER — METRONIDAZOLE 0.75 % VA GEL: 1.0000 | VAGINAL | Status: DC

## 2014-10-28 MED ORDER — METRONIDAZOLE 500 MG PO TABS: 500.0000 mg | ORAL_TABLET | Freq: Two times a day (BID) | ORAL | Status: DC

## 2014-10-28 MED ORDER — FLUCONAZOLE 150 MG PO TABS: 150.0000 mg | ORAL_TABLET | ORAL | Status: AC | PRN

## 2014-10-28 NOTE — Discharge Instructions (Signed)
Bacterial Vaginosis Bacterial vaginosis is a vaginal infection that occurs when the normal balance of bacteria in the vagina is disrupted. It results from an overgrowth of certain bacteria. This is the most common vaginal infection in women of childbearing age. Treatment is important to prevent complications, especially in pregnant women, as it can cause a premature delivery. CAUSES  Bacterial vaginosis is caused by an increase in harmful bacteria that are normally present in smaller amounts in the vagina. Several different kinds of bacteria can cause bacterial vaginosis. However, the reason that the condition develops is not fully understood. RISK FACTORS Certain activities or behaviors can put you at an increased risk of developing bacterial vaginosis, including:  Having a new sex partner or multiple sex partners.  Douching.  Using an intrauterine device (IUD) for contraception. Women do not get bacterial vaginosis from toilet seats, bedding, swimming pools, or contact with objects around them. SIGNS AND SYMPTOMS  Some women with bacterial vaginosis have no signs or symptoms. Common symptoms include:  Grey vaginal discharge.  A fishlike odor with discharge, especially after sexual intercourse.  Itching or burning of the vagina and vulva.  Burning or pain with urination. DIAGNOSIS  Your health care provider will take a medical history and examine the vagina for signs of bacterial vaginosis. A sample of vaginal fluid may be taken. Your health care provider will look at this sample under a microscope to check for bacteria and abnormal cells. A vaginal pH test may also be done.  TREATMENT  Bacterial vaginosis may be treated with antibiotic medicines. These may be given in the form of a pill or a vaginal cream. A second round of antibiotics may be prescribed if the condition comes back after treatment.  HOME CARE INSTRUCTIONS   Only take over-the-counter or prescription medicines as  directed by your health care provider.  If antibiotic medicine was prescribed, take it as directed. Make sure you finish it even if you start to feel better.  Do not have sex until treatment is completed.  Tell all sexual partners that you have a vaginal infection. They should see their health care provider and be treated if they have problems, such as a mild rash or itching.  Practice safe sex by using condoms and only having one sex partner. SEEK MEDICAL CARE IF:   Your symptoms are not improving after 3 days of treatment.  You have increased discharge or pain.  You have a fever. MAKE SURE YOU:   Understand these instructions.  Will watch your condition.  Will get help right away if you are not doing well or get worse. FOR MORE INFORMATION  Centers for Disease Control and Prevention, Division of STD Prevention: www.cdc.gov/std American Sexual Health Association (ASHA): www.ashastd.org  Document Released: 11/11/2005 Document Revised: 09/01/2013 Document Reviewed: 06/23/2013 [employer] Patient Information 2015 [employer]. This information is not intended to replace advice given to you by your health care provider. Make sure you discuss any questions you have with your health care provider.  

## 2014-10-29 LAB — GC/CHLAMYDIA PROBE AMP
CT Probe RNA: NEGATIVE
GC Probe RNA: NEGATIVE

## 2014-11-21 ENCOUNTER — Encounter: Payer: Self-pay | Admitting: Obstetrics & Gynecology

## 2014-11-21 ENCOUNTER — Ambulatory Visit (INDEPENDENT_AMBULATORY_CARE_PROVIDER_SITE_OTHER): Payer: Medicaid Other | Admitting: Obstetrics & Gynecology

## 2014-11-21 VITALS — BP 119/62 | HR 79 | Temp 98.3°F | Ht 62.0 in | Wt 206.5 lb

## 2014-11-21 DIAGNOSIS — K5909 Other constipation: Secondary | ICD-10-CM

## 2014-11-21 DIAGNOSIS — R1032 Left lower quadrant pain: Secondary | ICD-10-CM

## 2014-11-21 NOTE — Progress Notes (Signed)
Subjective:     Patient ID: Kim Johns, female   DOB: Jun 16, 1991, 23 y.o.   MRN: [id number]  [id number] LMP 11/09/2014.  Pt reports pain in her LLQ.  She reports that she has BM 'qod Maybe' the pain is worse if she has not had a BM.  She also reports that her pain improves with a BM.  Pt reports that she had the sx when she was pregnant in April and July.  She was seen in the [facility] for similar sx.  Wants to know if its related to her PTL and delivery. Pt reports cycles monthly but, can't discern if the pain is worse with menses.  She does c/o dysmenorrhea but, not sure if it is worsening of this pain.     sonos reviewed from April and May and there is no cyst on ovary.  Review of Systems     Objective:   Physical Exam BP 119/62 mmHg  Pulse 79  Temp(Src) 98.3 F (36.8 C)  Ht 5\' 2"  (1.575 m)  Wt 206 lb 8 oz (93.668 kg)  BMI 37.76 kg/m2  LMP 11/09/2014 Pt in NAD Abd: soft, NT; ND GU: EGBUS: no lesions Vagina: no blood in vault Cervix: no CMT Uterus: small, mobile Adnexa: no masses; non tender          Assessment:     LLQ pain suspect related to constipation     Plan:     Miralax 1 cap daily.  uncrease by 1 capful weekly until having daily BM F/u in 2 month or sooner prn Increased fiber in diet

## 2014-11-21 NOTE — Patient Instructions (Signed)

## 2014-11-25 NOTE — L&D Delivery Note (Cosign Needed)
Patient is 24 y.o. G2P0100 [redacted]w[redacted]d admitted for PROM.   Delivery Note At 11:27 PM a viable female was delivered via Vaginal, Spontaneous Delivery (Presentation: Occiput Anterior).  APGAR: 7, 8; weight 8 lb 0.6 oz (3646 g).   Placenta status: Intact, Spontaneous.  Cord: 3 vessels with the following complications: nuchal x1.    Anesthesia: Epidural  Episiotomy:  None Lacerations:  2nd degree perineal Suture Repair: 3.0 vicryl Est. Blood Loss (mL):  100  Mom to postpartum.  Baby to Couplet care / Skin to Skin.  Kim Hector, MD PGY-1 [facility]  08/24/2015, 12:04 AM  Patient is a G1 at [redacted]w[redacted]d who was admitted w/ SROM, uncomplicated prenatal course.  She progressed with augmentation via Pitocin.  I was gloved and present for delivery in its entirety.  Second stage of labor progressed, baby delivered after approx 30 mins of pushing.  No decels during second stage noted.  Complications: none   Kim Johns, CNM 3:06 AM 08/24/2015

## 2014-12-05 ENCOUNTER — Telehealth: Payer: Self-pay | Admitting: *Deleted

## 2014-12-05 NOTE — Telephone Encounter (Signed)
Kim Johns called and left a message over the weekend stating she took a home pregnancy test that was positive. She wants to know if she can come and get a confirmation test at clinic. States she wants soonest appointment since she had a preterm delivery in July and is worried.  Per chart review has already called front desk and got appointment for pregnancy test.  Renaee Munda and verified she has appointment.

## 2014-12-09 ENCOUNTER — Ambulatory Visit: Payer: Medicaid Other | Admitting: *Deleted

## 2014-12-09 DIAGNOSIS — Z3490 Encounter for supervision of normal pregnancy, unspecified, unspecified trimester: Secondary | ICD-10-CM

## 2014-12-09 LAB — POCT URINALYSIS DIP (DEVICE)
Bilirubin Urine: NEGATIVE
Glucose, UA: NEGATIVE mg/dL
Ketones, ur: NEGATIVE mg/dL
Leukocytes, UA: NEGATIVE
NITRITE: NEGATIVE
PH: 6 (ref 5.0–8.0)
Protein, ur: NEGATIVE mg/dL
Specific Gravity, Urine: 1.025 (ref 1.005–1.030)
Urobilinogen, UA: 0.2 mg/dL (ref 0.0–1.0)

## 2014-12-09 LAB — POCT PREGNANCY, URINE: Preg Test, Ur: POSITIVE — AB

## 2014-12-09 NOTE — Progress Notes (Signed)
Here for pregancy test- which was positive. Wants to get care here. LMP 11/09/14 of which she is certain and has regular periods. C/o spotting with " big gush 12/05/13 and then 12/08/14 another big gush. Also some spotting. Will order ultrasound for viability and then schedule initial ob. Will do labs once viability

## 2014-12-10 ENCOUNTER — Inpatient Hospital Stay (HOSPITAL_COMMUNITY): Payer: Medicaid Other

## 2014-12-10 ENCOUNTER — Encounter (HOSPITAL_COMMUNITY): Payer: Self-pay | Admitting: *Deleted

## 2014-12-10 ENCOUNTER — Inpatient Hospital Stay (HOSPITAL_COMMUNITY)
Admission: AD | Admit: 2014-12-10 | Discharge: 2014-12-10 | Disposition: A | Discharge status: Home / Self Care | Payer: Medicaid Other | Source: Ambulatory Visit | Attending: Obstetrics & Gynecology | Admitting: Obstetrics & Gynecology

## 2014-12-10 DIAGNOSIS — F17210 Nicotine dependence, cigarettes, uncomplicated: Secondary | ICD-10-CM | POA: Diagnosis not present

## 2014-12-10 DIAGNOSIS — O3680X0 Pregnancy with inconclusive fetal viability, not applicable or unspecified: Secondary | ICD-10-CM

## 2014-12-10 DIAGNOSIS — N831 Corpus luteum cyst: Secondary | ICD-10-CM | POA: Insufficient documentation

## 2014-12-10 DIAGNOSIS — Z3A01 Less than 8 weeks gestation of pregnancy: Secondary | ICD-10-CM | POA: Insufficient documentation

## 2014-12-10 DIAGNOSIS — O99331 Smoking (tobacco) complicating pregnancy, first trimester: Secondary | ICD-10-CM | POA: Diagnosis not present

## 2014-12-10 DIAGNOSIS — R109 Unspecified abdominal pain: Secondary | ICD-10-CM

## 2014-12-10 DIAGNOSIS — O3481 Maternal care for other abnormalities of pelvic organs, first trimester: Secondary | ICD-10-CM | POA: Insufficient documentation

## 2014-12-10 DIAGNOSIS — O26851 Spotting complicating pregnancy, first trimester: Secondary | ICD-10-CM | POA: Diagnosis present

## 2014-12-10 DIAGNOSIS — O26899 Other specified pregnancy related conditions, unspecified trimester: Secondary | ICD-10-CM

## 2014-12-10 DIAGNOSIS — O209 Hemorrhage in early pregnancy, unspecified: Secondary | ICD-10-CM | POA: Diagnosis not present

## 2014-12-10 DIAGNOSIS — O9989 Other specified diseases and conditions complicating pregnancy, childbirth and the puerperium: Secondary | ICD-10-CM

## 2014-12-10 LAB — URINALYSIS, ROUTINE W REFLEX MICROSCOPIC
Bilirubin Urine: NEGATIVE
Glucose, UA: NEGATIVE mg/dL
KETONES UR: NEGATIVE mg/dL
Leukocytes, UA: NEGATIVE
Nitrite: NEGATIVE
PH: 6 (ref 5.0–8.0)
PROTEIN: NEGATIVE mg/dL
Specific Gravity, Urine: 1.025 (ref 1.005–1.030)
Urobilinogen, UA: 0.2 mg/dL (ref 0.0–1.0)

## 2014-12-10 LAB — CBC
HCT: 39.1 % (ref 36.0–46.0)
HEMOGLOBIN: 14.1 g/dL (ref 12.0–15.0)
MCH: 34.1 pg — ABNORMAL HIGH (ref 26.0–34.0)
MCHC: 36.1 g/dL — ABNORMAL HIGH (ref 30.0–36.0)
MCV: 94.4 fL (ref 78.0–100.0)
Platelets: 261 10*3/uL (ref 150–400)
RBC: 4.14 MIL/uL (ref 3.87–5.11)
RDW: 13.3 % (ref 11.5–15.5)
WBC: 8 10*3/uL (ref 4.0–10.5)

## 2014-12-10 LAB — URINE MICROSCOPIC-ADD ON

## 2014-12-10 LAB — WET PREP, GENITAL
Clue Cells Wet Prep HPF POC: NONE SEEN
Trich, Wet Prep: NONE SEEN
Yeast Wet Prep HPF POC: NONE SEEN

## 2014-12-10 LAB — ABO/RH: ABO/RH(D): O POS

## 2014-12-10 LAB — HCG, QUANTITATIVE, PREGNANCY: HCG, BETA CHAIN, QUANT, S: 3472 m[IU]/mL — AB (ref <5)

## 2014-12-10 LAB — RAPID HIV SCREEN (WH-MAU): SUDS RAPID HIV SCREEN: NONREACTIVE

## 2014-12-10 NOTE — MAU Note (Signed)
Patient presents at [redacted] weeks gestation with complaint of spotting since Sunday.

## 2014-12-10 NOTE — MAU Provider Note (Signed)
History     CSN: [id number]  Arrival date and time: 12/10/14 1442   First Provider Initiated Contact with Patient 12/10/14 1556      Chief Complaint  Patient presents with  . Vaginal Bleeding   HPI   [name] is a 24 y.o. female G2P0100 at [redacted]w[redacted]d who presents with vaginal spotting. She started spotting last Sunday; it waxes/ wanes. She was seen in the clinic yesterday for a pregnancy verification letter. She was not having pain or bleeding at that time. She is very worried because she had a 20 week fetal demise in June 2015.   Currently she rates her pain 4/10; mild cramping in her stomach.  Currently the spotting is very light.   OB History    Gravida Para Term Preterm AB TAB SAB Ectopic Multiple Living   2 1  1  0 0 0 0 0 0      Past Medical History  Diagnosis Date  . Allergy   . Asthma     albuterol last used a few months ago  . Preterm labor     Past Surgical History  Procedure Laterality Date  . Wisdom tooth extraction      Family History  Problem Relation Age of Onset  . Diabetes Mother   . Stroke Mother   . Hypertension Father     History  Substance Use Topics  . Smoking status: Current Every Day Smoker -- 0.50 packs/day for 10 years    Types: Cigarettes  . Smokeless tobacco: Never Used  . Alcohol Use: No    Allergies:  Allergies  Allergen Reactions  . Shellfish Allergy Hives  . Sulfa Antibiotics Other (See Comments)    Reaction:  Unknown; childhood reaction  . Icy Hot Rash    Prescriptions prior to admission  Medication Sig Dispense Refill Last Dose  . Prenatal Vit-Fe Fumarate-FA (PRENATAL MULTIVITAMIN) TABS tablet Take 1 tablet by mouth daily.   12/09/2014 at Unknown time  . carbamide peroxide (DEBROX) 6.5 % otic solution Place 5 drops into both ears 2 (two) times daily. (Patient not taking: Reported on 11/21/2014) 15 mL 1 Not Taking  . metroNIDAZOLE (FLAGYL) 500 MG tablet Take 1 tablet (500 mg total) by mouth 2 (two) times daily.  (Patient not taking: Reported on 11/21/2014) 14 tablet 0 Not Taking  . metroNIDAZOLE (METROGEL VAGINAL) 0.75 % vaginal gel Place 1 Applicatorful vaginally 2 (two) times a week. Start after you have completed course of Flagyl. (Patient not taking: Reported on 11/21/2014) 70 g 3 Not Taking   Results for orders placed or performed during the hospital encounter of 12/10/14 (from the past 48 hour(s))  Urinalysis, Routine w reflex microscopic     Status: Abnormal   Collection Time: 12/10/14  3:05 PM  Result Value Ref Range   Color, Urine YELLOW YELLOW   APPearance HAZY (A) CLEAR   Specific Gravity, Urine 1.025 1.005 - 1.030   pH 6.0 5.0 - 8.0   Glucose, UA NEGATIVE NEGATIVE mg/dL   Hgb urine dipstick MODERATE (A) NEGATIVE   Bilirubin Urine NEGATIVE NEGATIVE   Ketones, ur NEGATIVE NEGATIVE mg/dL   Protein, ur NEGATIVE NEGATIVE mg/dL   Urobilinogen, UA 0.2 0.0 - 1.0 mg/dL   Nitrite NEGATIVE NEGATIVE   Leukocytes, UA NEGATIVE NEGATIVE  Urine microscopic-add on     Status: Abnormal   Collection Time: 12/10/14  3:05 PM  Result Value Ref Range   Squamous Epithelial / LPF FEW (A) RARE   WBC, UA 0-2 <  3 WBC/hpf   RBC / HPF 3-6 <3 RBC/hpf   Bacteria, UA FEW (A) RARE  Wet prep, genital     Status: Abnormal   Collection Time: 12/10/14  4:10 PM  Result Value Ref Range   Yeast Wet Prep HPF POC NONE SEEN NONE SEEN   Trich, Wet Prep NONE SEEN NONE SEEN   Clue Cells Wet Prep HPF POC NONE SEEN NONE SEEN   WBC, Wet Prep HPF POC FEW (A) NONE SEEN    Comment: MANY BACTERIA SEEN  Rapid HIV screen [facility])     Status: None   Collection Time: 12/10/14  4:20 PM  Result Value Ref Range   SUDS Rapid HIV Screen NON REACTIVE NON REACTIVE  CBC     Status: Abnormal   Collection Time: 12/10/14  4:20 PM  Result Value Ref Range   WBC 8.0 4.0 - 10.5 K/uL   RBC 4.14 3.87 - 5.11 MIL/uL   Hemoglobin 14.1 12.0 - 15.0 g/dL   HCT 39.1 36.0 - 46.0 %   MCV 94.4 78.0 - 100.0 fL   MCH 34.1 (H) 26.0 - 34.0 pg   MCHC 36.1  (H) 30.0 - 36.0 g/dL   RDW 13.3 11.5 - 15.5 %   Platelets 261 150 - 400 K/uL  ABO/Rh     Status: None   Collection Time: 12/10/14  4:20 PM  Result Value Ref Range   ABO/RH(D) O POS   hCG, quantitative, pregnancy     Status: Abnormal   Collection Time: 12/10/14  4:20 PM  Result Value Ref Range   hCG, Beta Chain, Quant, S 3472 (H) <5 mIU/mL    Comment:          GEST. AGE      CONC.  (mIU/mL)   <=1 WEEK        5 - 50     2 WEEKS       50 - 500     3 WEEKS       100 - 10,000     4 WEEKS     1,000 - 30,000     5 WEEKS     3,500 - 115,000   6-8 WEEKS     12,000 - 270,000    12 WEEKS     15,000 - 220,000        FEMALE AND NON-PREGNANT FEMALE:     LESS THAN 5 mIU/mL    US Ob Comp Less 14 Wks  12/10/2014   CLINICAL DATA:  Vaginal bleeding, intermittent spotting since 12/05/2014, EGA [redacted] weeks 3 days by LMP, quantitative beta HCG 3,472  EXAM: OBSTETRIC <14 WK [place] AND TRANSVAGINAL OB US  TECHNIQUE: Both transabdominal and transvaginal ultrasound examinations were performed for complete evaluation of the gestation as well as the maternal uterus, adnexal regions, and pelvic cul-de-sac. Transvaginal technique was performed to assess early pregnancy.  COMPARISON:  None for this gestational  FINDINGS: Intrauterine gestational sac: Visualized/normal in shape.  Yolk sac:  Not definitively visualized  Embryo:  None identified  Cardiac Activity: N/A  Heart Rate:  N/A bpm  MSD:  4.1  mm   5 w   1  d  Maternal uterus/adnexae:  No subchorionic hemorrhage.  LEFT ovary normal size and morphology, 3.3 x 2.2 x 2.0 cm.  RIGHT ovary measures 4.3 x 2.7 x 2.3 cm and contains a small hemorrhagic corpus luteal cyst.  No adnexal masses.  Trace free pelvic fluid RIGHT adnexa.  IMPRESSION: Gestational sac within  the uterus without identification of a fetal pole.  Mean sac diameter corresponds to 5 weeks 1 day EGA.  No subchorionic hemorrhage identified.   Electronically Signed   By: Lavonia Dana M.D.   On: 12/10/2014 17:46   US  Ob Transvaginal  12/10/2014   CLINICAL DATA:  Vaginal bleeding, intermittent spotting since 12/05/2014, EGA [redacted] weeks 3 days by LMP, quantitative beta HCG 3,472  EXAM: OBSTETRIC <14 WK [place] AND TRANSVAGINAL OB US  TECHNIQUE: Both transabdominal and transvaginal ultrasound examinations were performed for complete evaluation of the gestation as well as the maternal uterus, adnexal regions, and pelvic cul-de-sac. Transvaginal technique was performed to assess early pregnancy.  COMPARISON:  None for this gestational  FINDINGS: Intrauterine gestational sac: Visualized/normal in shape.  Yolk sac:  Not definitively visualized  Embryo:  None identified  Cardiac Activity: N/A  Heart Rate:  N/A bpm  MSD:  4.1  mm   5 w   1  d  Maternal uterus/adnexae:  No subchorionic hemorrhage.  LEFT ovary normal size and morphology, 3.3 x 2.2 x 2.0 cm.  RIGHT ovary measures 4.3 x 2.7 x 2.3 cm and contains a small hemorrhagic corpus luteal cyst.  No adnexal masses.  Trace free pelvic fluid RIGHT adnexa.  IMPRESSION: Gestational sac within the uterus without identification of a fetal pole.  Mean sac diameter corresponds to 5 weeks 1 day EGA.  No subchorionic hemorrhage identified.   Electronically Signed   By: Lavonia Dana M.D.   On: 12/10/2014 17:46    Review of Systems  Constitutional: Negative for fever and chills.  Gastrointestinal: Positive for abdominal pain. Negative for nausea and vomiting.  Genitourinary: Negative for dysuria, urgency, frequency and hematuria.   Physical Exam   Blood pressure 112/65, pulse 85, temperature 98.3 F (36.8 C), temperature source Oral, resp. rate 16, height 5\' 4"  (1.626 m), weight 96.786 kg (213 lb 6 oz), last menstrual period 11/09/2014.  Physical Exam  Constitutional: She is oriented to person, place, and time. She appears well-developed and well-nourished. No distress.  HENT:  Head: Normocephalic.  Eyes: Pupils are equal, round, and reactive to light.  Neck: Neck supple.  Respiratory:  Effort normal.  GI: Soft. She exhibits no distension. There is no tenderness. There is no rebound and no guarding.  Genitourinary:  Speculum exam: Vagina - Small amount of creamy, brown/pink discharge, no odor Cervix - No contact bleeding, no active bleeding  Bimanual exam: Cervix closed Uterus normal size; not enlarged  Adnexa non tender, no masses bilaterally GC/Chlam, wet prep done Chaperone present for exam.   Musculoskeletal: Normal range of motion.  Neurological: She is alert and oriented to person, place, and time.  Skin: Skin is warm. She is not diaphoretic.  Psychiatric: Her behavior is normal.    MAU Course  Procedures  None  MDM O positive blood type Beta hcg level  [place]  cbc  Wet prep GC HIV  Assessment and Plan   A:  Small, hemorrhagic corpus luteal cyst  Gestational sac without yolk sac Pregnancy of unknown location  Vaginal bleeding in pregnancy  P:  Discharge home in stable condition Follow up in the clinic in 48 hours for repeat beta hcg ( in the clinic) Patient will likely need follow up US based on the [place] on Monday.  Return to MAU as needed, if symptoms worsen  Ectopic precautions  Pelvic rest   [place], NP 12/10/2014 7:14 PM

## 2014-12-12 ENCOUNTER — Other Ambulatory Visit: Payer: Medicaid Other

## 2014-12-12 ENCOUNTER — Other Ambulatory Visit: Payer: Self-pay | Admitting: Family Medicine

## 2014-12-12 DIAGNOSIS — O209 Hemorrhage in early pregnancy, unspecified: Secondary | ICD-10-CM

## 2014-12-12 LAB — GC/CHLAMYDIA PROBE AMP (~~LOC~~) NOT AT ARMC
Chlamydia: NEGATIVE
NEISSERIA GONORRHEA: NEGATIVE

## 2014-12-12 LAB — HCG, QUANTITATIVE, PREGNANCY: hCG, Beta Chain, Quant, S: 5616 m[IU]/mL

## 2014-12-13 ENCOUNTER — Telehealth: Payer: Self-pay | Admitting: *Deleted

## 2014-12-13 DIAGNOSIS — O3680X0 Pregnancy with inconclusive fetal viability, not applicable or unspecified: Secondary | ICD-10-CM

## 2014-12-13 LAB — CULTURE, OB URINE: Colony Count: >=100000

## 2014-12-13 NOTE — Telephone Encounter (Signed)
[place] scheduled for 12/19/14 @ 245p according to Dr. Nehemiah Settle order.  Pt informed of [place] date/time.

## 2014-12-13 NOTE — Telephone Encounter (Signed)
Pt left message requesting lab results. She stated that a message can be left on her voice mail if she does not answer. She also wants to know if [place] is going to be scheduled.

## 2014-12-13 NOTE — Progress Notes (Signed)
[place] scheduled for 12/19/14 @ 1445.  Called pt and informed pt of [place] appt.  Pt agreed with no further questions.

## 2014-12-16 ENCOUNTER — Ambulatory Visit (HOSPITAL_COMMUNITY): Payer: Medicaid Other

## 2014-12-19 ENCOUNTER — Other Ambulatory Visit: Payer: Self-pay | Admitting: Family Medicine

## 2014-12-19 ENCOUNTER — Ambulatory Visit (HOSPITAL_COMMUNITY)
Admission: RE | Admit: 2014-12-19 | Discharge: 2014-12-19 | Disposition: A | Discharge status: Home / Self Care | Payer: Medicaid Other | Source: Ambulatory Visit | Attending: Family Medicine | Admitting: Family Medicine

## 2014-12-19 ENCOUNTER — Encounter: Payer: Self-pay | Admitting: Obstetrics & Gynecology

## 2014-12-19 ENCOUNTER — Telehealth: Payer: Self-pay | Admitting: *Deleted

## 2014-12-19 DIAGNOSIS — N8320 Unspecified ovarian cysts: Secondary | ICD-10-CM | POA: Diagnosis not present

## 2014-12-19 DIAGNOSIS — Z3A01 Less than 8 weeks gestation of pregnancy: Secondary | ICD-10-CM | POA: Diagnosis not present

## 2014-12-19 DIAGNOSIS — O3680X0 Pregnancy with inconclusive fetal viability, not applicable or unspecified: Secondary | ICD-10-CM

## 2014-12-19 DIAGNOSIS — O3481 Maternal care for other abnormalities of pelvic organs, first trimester: Secondary | ICD-10-CM | POA: Diagnosis not present

## 2014-12-19 DIAGNOSIS — O208 Other hemorrhage in early pregnancy: Secondary | ICD-10-CM | POA: Diagnosis present

## 2014-12-19 NOTE — Telephone Encounter (Signed)
Pt contacted clinic with request for nurse to call her.   Contacted patient, pt states she woke up bleeding with clots but it has slowed down now.  Pt states she is not having any pain and has ultrasound appointment today.  Reviewed signs and symptoms for patient to go to MAU otherwise, keep your ultrasound appointment.  Pt verbalizes understanding.

## 2014-12-20 ENCOUNTER — Telehealth: Payer: Self-pay | Admitting: General Practice

## 2014-12-20 NOTE — Telephone Encounter (Signed)
Patient called and left message stating she had an ultrasound done yesterday and she would like her results. Called patient and informed her of results and asked if she was still having bleeding. Patient states she is spotting but hasn't had any more than that today. Reminded patient of new OB appt on Monday and that if she were to have bleeding like a period before Monday to go to MAU for further evaluation. Patient verbalized understanding and had no other questions

## 2014-12-26 ENCOUNTER — Encounter: Payer: Self-pay | Admitting: Obstetrics and Gynecology

## 2014-12-26 ENCOUNTER — Other Ambulatory Visit: Payer: Self-pay | Admitting: Obstetrics and Gynecology

## 2014-12-26 ENCOUNTER — Ambulatory Visit (INDEPENDENT_AMBULATORY_CARE_PROVIDER_SITE_OTHER): Payer: Self-pay | Admitting: Obstetrics and Gynecology

## 2014-12-26 VITALS — BP 119/66 | HR 83 | Wt 212.7 lb

## 2014-12-26 DIAGNOSIS — O021 Missed abortion: Secondary | ICD-10-CM

## 2014-12-26 DIAGNOSIS — O0993 Supervision of high risk pregnancy, unspecified, third trimester: Secondary | ICD-10-CM | POA: Insufficient documentation

## 2014-12-26 DIAGNOSIS — IMO0002 Reserved for concepts with insufficient information to code with codable children: Secondary | ICD-10-CM

## 2014-12-26 DIAGNOSIS — O0991 Supervision of high risk pregnancy, unspecified, first trimester: Secondary | ICD-10-CM

## 2014-12-26 DIAGNOSIS — O09291 Supervision of pregnancy with other poor reproductive or obstetric history, first trimester: Secondary | ICD-10-CM

## 2014-12-26 DIAGNOSIS — O42919 Preterm premature rupture of membranes, unspecified as to length of time between rupture and onset of labor, unspecified trimester: Secondary | ICD-10-CM

## 2014-12-26 DIAGNOSIS — Z3682 Encounter for antenatal screening for nuchal translucency: Secondary | ICD-10-CM

## 2014-12-26 LAB — POCT URINALYSIS DIP (DEVICE)
BILIRUBIN URINE: NEGATIVE
Glucose, UA: NEGATIVE mg/dL
KETONES UR: NEGATIVE mg/dL
NITRITE: NEGATIVE
Protein, ur: NEGATIVE mg/dL
Specific Gravity, Urine: 1.025 (ref 1.005–1.030)
UROBILINOGEN UA: 0.2 mg/dL (ref 0.0–1.0)
pH: 6.5 (ref 5.0–8.0)

## 2014-12-26 LAB — OB RESULTS CONSOLE RUBELLA ANTIBODY, IGM: RUBELLA: NON-IMMUNE/NOT IMMUNE

## 2014-12-26 LAB — RPR

## 2014-12-26 LAB — GLUCOSE TOLERANCE, 1 HOUR (50G) W/O FASTING: GLUCOSE 1 HOUR GTT: 106 mg/dL (ref 70–140)

## 2014-12-26 NOTE — Progress Notes (Signed)
Patient reports occasional cramps and spotting

## 2014-12-26 NOTE — Progress Notes (Signed)
First Screen 02/06/15 2 9a with [place].  Anatomy 03/20/15 @ 930a with Radiology.

## 2014-12-26 NOTE — Progress Notes (Signed)
New OB visit today. Doing well. Small amount of brown spotting. Denies cramping, passing clots. Subchorionic hemorrhage seen on dating scan.  1. History of PPROM at 21 weeks, leading to fetal demise. Would be a candidate for 17-P. Counseled on risks, benefits. Will start paperwork today.  2. Routine PNC. Pap smear 3/15 wnl, no need to repeat today. GC/CT neg. Routine initial labs today. Urine culture today.

## 2014-12-26 NOTE — Progress Notes (Signed)
Nutrition note: 1st visit consult Pt has h/o obesity. Pt has gained 2.7# @ [redacted]w[redacted]d, which is slightly > expected. Pt reports eating 2 meals & 3-4 snacks/d. Pt is taking a PNV. Pt reports no N/V or heartburn. Pt is allergic to shellfish. Pt received verbal & written education on general nutrition during pregnancy. Discussed options of pumping once she goes back to work. Discussed wt gain goals of 11-20# or 0.5#/ wk in 2nd & 3rd trimester. Pt agrees to continue taking PNV. Pt does not have [place] but plans to apply. Pt plans to BF. F/u as needed Vladimir Faster, MS, RD, LDN, [facility]

## 2014-12-28 ENCOUNTER — Telehealth: Payer: Self-pay | Admitting: General Practice

## 2014-12-28 LAB — PRESCRIPTION MONITORING PROFILE (19 PANEL)
AMPHETAMINE/METH: NEGATIVE ng/mL
BARBITURATE SCREEN, URINE: NEGATIVE ng/mL
BUPRENORPHINE, URINE: NEGATIVE ng/mL
Benzodiazepine Screen, Urine: NEGATIVE ng/mL
CANNABINOID SCRN UR: NEGATIVE ng/mL
CREATININE, URINE: 277.04 mg/dL (ref >20.0)
Carisoprodol, Urine: NEGATIVE ng/mL
Cocaine Metabolites: NEGATIVE ng/mL
Fentanyl, Ur: NEGATIVE ng/mL
MDMA URINE: NEGATIVE ng/mL
METHADONE SCREEN, URINE: NEGATIVE ng/mL
METHAQUALONE SCREEN (URINE): NEGATIVE ng/mL
Meperidine, Ur: NEGATIVE ng/mL
NITRITES URINE, INITIAL: NEGATIVE ug/mL
Opiate Screen, Urine: NEGATIVE ng/mL
Oxycodone Screen, Ur: NEGATIVE ng/mL
Phencyclidine, Ur: NEGATIVE ng/mL
Propoxyphene: NEGATIVE ng/mL
TRAMADOL UR: NEGATIVE ng/mL
Tapentadol, urine: NEGATIVE ng/mL
ZOLPIDEM, URINE: NEGATIVE ng/mL
pH, Initial: 6.8 pH (ref 4.5–8.9)

## 2014-12-28 LAB — HEMOGLOBINOPATHY EVALUATION
HEMOGLOBIN OTHER: 0 %
HGB A2 QUANT: 2.4 % (ref 2.2–3.2)
HGB A: 97.4 % (ref 96.8–97.8)
Hgb F Quant: 0.2 % (ref 0.0–2.0)
Hgb S Quant: 0 %

## 2014-12-28 NOTE — Telephone Encounter (Signed)
Patient called and left message stating she was here Monday and we did a urine culture and she would like to know if her results are back yet. Called patient back and explained urine culture process and that her results were not back yet and to try checking in again tomorrow or Friday morning. Patient verbalized understanding and had no other questions

## 2014-12-30 ENCOUNTER — Other Ambulatory Visit: Payer: Self-pay | Admitting: Obstetrics and Gynecology

## 2014-12-30 ENCOUNTER — Telehealth: Payer: Self-pay | Admitting: *Deleted

## 2014-12-30 LAB — CULTURE, OB URINE: Colony Count: >=100000

## 2014-12-30 MED ORDER — CEPHALEXIN 500 MG PO CAPS: 500.0000 mg | ORAL_CAPSULE | Freq: Four times a day (QID) | ORAL | Status: DC

## 2014-12-30 NOTE — Telephone Encounter (Addendum)
Pt left message requesting results of urine culture form 2/1.  I returned pt's call and informed her that she does have a bladder infection requiring antibiotic treatment. Her prescription has been sent to her pharmacy.  Pt voiced understanding and stated she will obtain medication today.

## 2015-01-02 ENCOUNTER — Other Ambulatory Visit: Payer: Self-pay | Admitting: Obstetrics and Gynecology

## 2015-01-02 LAB — RUBELLA ANTIBODY, IGM: Rubella IgM: 0.45

## 2015-01-03 ENCOUNTER — Telehealth: Payer: Self-pay | Admitting: *Deleted

## 2015-01-03 NOTE — Telephone Encounter (Signed)
Pt left message stating that she is having some yellow, mucousy discharge. She wants to know if this is normal and if it is related to the antibiotics she is taking for a bladder infection. I called pt and discussed her concern. She denies vaginal itching, irritation or odor from the discharge. Pt states "I am just paranoid about everything."  I advised pt that there is no cause for concern unless these sx develop. She should call our office if they do develop, however they do not signify any emergency. We would schedule appt and perform evaluation based on her symptoms.  Pt voiced understanding.

## 2015-01-10 ENCOUNTER — Telehealth: Payer: Self-pay | Admitting: *Deleted

## 2015-01-10 MED ORDER — FLUCONAZOLE 150 MG PO TABS: ORAL_TABLET | ORAL | Status: DC

## 2015-01-10 NOTE — Telephone Encounter (Signed)
Pt left message stating that has just finished a course of antibiotics for UTI and now believes she has a vaginal yeast infection. She states this is a common occurrence for her after antibiotics and she usually requires 2 doses of diflucan to treat the problem. I called and spoke w/pt today and explained that we have not been in the office since she left message on 2/12. She reports that she is still having a thick white vaginal discharge with itching and irritation of the area. I told pt that I will send Rx to her pharmacy as requested. She voiced understanding.

## 2015-01-16 ENCOUNTER — Ambulatory Visit (INDEPENDENT_AMBULATORY_CARE_PROVIDER_SITE_OTHER): Payer: Self-pay | Admitting: Obstetrics and Gynecology

## 2015-01-16 ENCOUNTER — Ambulatory Visit: Payer: Medicaid Other | Admitting: Obstetrics & Gynecology

## 2015-01-16 ENCOUNTER — Encounter: Payer: Self-pay | Admitting: Obstetrics and Gynecology

## 2015-01-16 VITALS — BP 116/54 | HR 72 | Temp 98.4°F | Wt 212.9 lb

## 2015-01-16 DIAGNOSIS — IMO0002 Reserved for concepts with insufficient information to code with codable children: Secondary | ICD-10-CM

## 2015-01-16 DIAGNOSIS — O42919 Preterm premature rupture of membranes, unspecified as to length of time between rupture and onset of labor, unspecified trimester: Secondary | ICD-10-CM

## 2015-01-16 DIAGNOSIS — O0991 Supervision of high risk pregnancy, unspecified, first trimester: Secondary | ICD-10-CM

## 2015-01-16 LAB — POCT URINALYSIS DIP (DEVICE)
Bilirubin Urine: NEGATIVE
Glucose, UA: NEGATIVE mg/dL
Ketones, ur: NEGATIVE mg/dL
Nitrite: NEGATIVE
Protein, ur: NEGATIVE mg/dL
SPECIFIC GRAVITY, URINE: 1.025 (ref 1.005–1.030)
UROBILINOGEN UA: 1 mg/dL (ref 0.0–1.0)
pH: 6.5 (ref 5.0–8.0)

## 2015-01-16 NOTE — Addendum Note (Signed)
Addended by: Christiana Pellant A on: 01/16/2015 03:51 PM   Modules accepted: Orders

## 2015-01-16 NOTE — Progress Notes (Signed)
Patient with frequent urination. Will check urine culture. Patient otherwise denies cramping or vaginal bleeding. Will start weekly 17-P at 16 weeks First screen scheduled on 3/14 Patient desires to be seen by as few providers as possible. Explained the scope of our practise with multiple MDs, midwives and residents as well as provider who is on call performing the delivery. Patient verbalized understanding

## 2015-01-16 NOTE — Progress Notes (Signed)
Last visit, patient filled out form for 17P.  No one from [facility] has contacted her, she is wondering if the process was completed. Pt continues to experience frequent urination, is concerned she may still have a UTI, she took medication.

## 2015-01-17 LAB — WET PREP, GENITAL
TRICH WET PREP: NONE SEEN
WBC, Wet Prep HPF POC: NONE SEEN
YEAST WET PREP: NONE SEEN

## 2015-01-18 LAB — CULTURE, OB URINE: Colony Count: 30000

## 2015-01-19 ENCOUNTER — Telehealth: Payer: Self-pay

## 2015-01-19 ENCOUNTER — Other Ambulatory Visit: Payer: Self-pay | Admitting: Obstetrics and Gynecology

## 2015-01-19 MED ORDER — METRONIDAZOLE 500 MG PO TABS: 500.0000 mg | ORAL_TABLET | Freq: Two times a day (BID) | ORAL | Status: DC

## 2015-01-19 NOTE — Telephone Encounter (Signed)
Called patient and informed her of results and medication at pharmacy. Patient verbalized understanding and gratitude. No questions or concerns.

## 2015-01-19 NOTE — Telephone Encounter (Signed)
-----   Message from Mora Bellman, MD sent at 01/19/2015  8:17 AM EST ----- Please inform patient of positive BV. Flagyl e-prescribed  Thanks  [employer]

## 2015-01-25 ENCOUNTER — Telehealth: Payer: Self-pay | Admitting: *Deleted

## 2015-01-25 NOTE — Telephone Encounter (Signed)
Pt left message stating that she thinks she has another UTI. She was treated for one a few weeks ago and now has the same sx. She does not want to wait until next appt to be seen. She wants to come in tomorrow morning and give urine sample. I called pt after consult w/Dr. Harolyn Rutherford and agreed that she needs to submit another urine specimen.  The specimen needs to be a clean catch specimen. Pt stated that she will come in tomorrow @ 0900.

## 2015-01-25 NOTE — Telephone Encounter (Signed)
Kim Johns called and left a voice message that she just spoke with someone but she forgot to bring up that she has been having green stool for the past 2-3 weeks. States she doesn't feel like that it is normal . States she isn't sure if it because she is pregnant , states is on prenatal vitamins without iron, also states she had cabbage and had diarrhea. Request a call back. Called Kim Johns and she states she thinks she ate the cabbage about 2-3 weeks ago and had diarrhea and wondered if it caused food poisoining, but is feeling ok .  States she also started eating activia daily about 2-3 weeks ago and having the green bowel movements around then. States her bowel movements are not completely normal and she is having 1-2 a day and her usual pattern is once every 2-3 days. Discussed with Kim Johns and informed Kim Johns it is likely the [name] is causing her bowel movements to change to green, but that is ok as long as she isn't having black stools.  [name] to discuss with her provider at next ob visit in 2 weeks. Kim Johns states she may stop the [name].

## 2015-01-26 ENCOUNTER — Telehealth: Payer: Self-pay

## 2015-01-26 ENCOUNTER — Ambulatory Visit: Payer: Self-pay | Admitting: *Deleted

## 2015-01-26 ENCOUNTER — Telehealth: Payer: Self-pay | Admitting: *Deleted

## 2015-01-26 DIAGNOSIS — R300 Dysuria: Secondary | ICD-10-CM

## 2015-01-26 DIAGNOSIS — N390 Urinary tract infection, site not specified: Secondary | ICD-10-CM

## 2015-01-26 LAB — POCT URINALYSIS DIP (DEVICE)
Bilirubin Urine: NEGATIVE
Glucose, UA: NEGATIVE mg/dL
Ketones, ur: NEGATIVE mg/dL
Nitrite: NEGATIVE
PH: 7 (ref 5.0–8.0)
PROTEIN: NEGATIVE mg/dL
SPECIFIC GRAVITY, URINE: 1.02 (ref 1.005–1.030)
UROBILINOGEN UA: 0.2 mg/dL (ref 0.0–1.0)

## 2015-01-26 MED ORDER — CEFADROXIL 500 MG PO CAPS: 500.0000 mg | ORAL_CAPSULE | Freq: Two times a day (BID) | ORAL | Status: DC

## 2015-01-26 NOTE — Telephone Encounter (Signed)
Kim Johns called and left a message that she just got a message that we called in her prescription to her pharmacy. States she called the pharmacy and they do not carry that medicine. Kim Johns wants to know if we send order to [facility] on [address] or send in a different medicine. Requests a call back.  Discussed with Dr. Nehemiah Settle and he states can have new orders for Keflex and pyridium.  Called Kim Johns and she states she actually got her pharmacy to switch to [facility] and is getting it filled there so doesn't need anything now.

## 2015-01-26 NOTE — Telephone Encounter (Signed)
Patient called stating she came into clinic today for ?UTI due to burning with urination and pressure. States urine will be sent for culture but is requesting a RX as she cannot bear the symptoms any longer. Discussed results with Dr. Nehemiah Settle who reviewed patient's chart and states Cefodroxil 500mg  BID X 7 days can be prescribed-- medication e-prescribed. Called patient and left message informing her of RX at pharmacy.

## 2015-01-27 ENCOUNTER — Encounter: Payer: Self-pay | Admitting: *Deleted

## 2015-01-27 LAB — CULTURE, OB URINE
Colony Count: NO GROWTH
ORGANISM ID, BACTERIA: NO GROWTH

## 2015-01-29 ENCOUNTER — Encounter (HOSPITAL_COMMUNITY): Payer: Self-pay | Admitting: *Deleted

## 2015-01-29 ENCOUNTER — Emergency Department (HOSPITAL_COMMUNITY)
Admission: EM | Admit: 2015-01-29 | Discharge: 2015-01-29 | Disposition: A | Discharge status: Home / Self Care | Payer: Medicaid Other | Attending: Emergency Medicine | Admitting: Emergency Medicine

## 2015-01-29 DIAGNOSIS — Z207 Contact with and (suspected) exposure to pediculosis, acariasis and other infestations: Secondary | ICD-10-CM | POA: Insufficient documentation

## 2015-01-29 DIAGNOSIS — Z2089 Contact with and (suspected) exposure to other communicable diseases: Secondary | ICD-10-CM

## 2015-01-29 DIAGNOSIS — Z8751 Personal history of pre-term labor: Secondary | ICD-10-CM | POA: Insufficient documentation

## 2015-01-29 DIAGNOSIS — J45909 Unspecified asthma, uncomplicated: Secondary | ICD-10-CM | POA: Insufficient documentation

## 2015-01-29 DIAGNOSIS — Z792 Long term (current) use of antibiotics: Secondary | ICD-10-CM | POA: Insufficient documentation

## 2015-01-29 DIAGNOSIS — Z87891 Personal history of nicotine dependence: Secondary | ICD-10-CM | POA: Insufficient documentation
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
## 2015-01-31 ENCOUNTER — Inpatient Hospital Stay (HOSPITAL_COMMUNITY)
Admission: AD | Admit: 2015-01-31 | Discharge: 2015-01-31 | Disposition: A | Discharge status: Home / Self Care | Payer: Self-pay | Source: Ambulatory Visit | Attending: Family Medicine | Admitting: Family Medicine

## 2015-01-31 ENCOUNTER — Encounter (HOSPITAL_COMMUNITY): Payer: Self-pay | Admitting: Advanced Practice Midwife

## 2015-01-31 DIAGNOSIS — R109 Unspecified abdominal pain: Secondary | ICD-10-CM | POA: Insufficient documentation

## 2015-01-31 DIAGNOSIS — O26879 Cervical shortening, unspecified trimester: Secondary | ICD-10-CM

## 2015-01-31 DIAGNOSIS — O9989 Other specified diseases and conditions complicating pregnancy, childbirth and the puerperium: Secondary | ICD-10-CM | POA: Insufficient documentation

## 2015-01-31 DIAGNOSIS — Z87891 Personal history of nicotine dependence: Secondary | ICD-10-CM | POA: Insufficient documentation

## 2015-01-31 DIAGNOSIS — Z3A11 11 weeks gestation of pregnancy: Secondary | ICD-10-CM | POA: Insufficient documentation

## 2015-01-31 LAB — URINALYSIS, ROUTINE W REFLEX MICROSCOPIC
Bilirubin Urine: NEGATIVE
Glucose, UA: NEGATIVE mg/dL
HGB URINE DIPSTICK: NEGATIVE
Ketones, ur: NEGATIVE mg/dL
Leukocytes, UA: NEGATIVE
Nitrite: NEGATIVE
PH: 8 (ref 5.0–8.0)
Protein, ur: NEGATIVE mg/dL
SPECIFIC GRAVITY, URINE: 1.015 (ref 1.005–1.030)
Urobilinogen, UA: 0.2 mg/dL (ref 0.0–1.0)

## 2015-01-31 NOTE — Discharge Instructions (Signed)
First Trimester of Pregnancy The first trimester of pregnancy is from week 1 until the end of week 12 (months 1 through 3). A week after a sperm fertilizes an egg, the egg will implant on the wall of the uterus. This embryo will begin to develop into a baby. Genes from you and your partner are forming the baby. The female genes determine whether the baby is a boy or a girl. At 6-8 weeks, the eyes and face are formed, and the heartbeat can be seen on ultrasound. At the end of 12 weeks, all the baby's organs are formed.  Now that you are pregnant, you will want to do everything you can to have a healthy baby. Two of the most important things are to get good prenatal care and to follow your health care provider's instructions. Prenatal care is all the medical care you receive before the baby's birth. This care will help prevent, find, and treat any problems during the pregnancy and childbirth. BODY CHANGES Your body goes through many changes during pregnancy. The changes vary from woman to woman.   You may gain or lose a couple of pounds at first.  You may feel sick to your stomach (nauseous) and throw up (vomit). If the vomiting is uncontrollable, call your health care provider.  You may tire easily.  You may develop headaches that can be relieved by medicines approved by your health care provider.  You may urinate more often. Painful urination may mean you have a bladder infection.  You may develop heartburn as a result of your pregnancy.  You may develop constipation because certain hormones are causing the muscles that push waste through your intestines to slow down.  You may develop hemorrhoids or swollen, bulging veins (varicose veins).  Your breasts may begin to grow larger and become tender. Your nipples may stick out more, and the tissue that surrounds them (areola) may become darker.  Your gums may bleed and may be sensitive to brushing and flossing.  Dark spots or blotches (chloasma,  mask of pregnancy) may develop on your face. This will likely fade after the baby is born.  Your menstrual periods will stop.  You may have a loss of appetite.  You may develop cravings for certain kinds of food.  You may have changes in your emotions from day to day, such as being excited to be pregnant or being concerned that something may go wrong with the pregnancy and baby.  You may have more vivid and strange dreams.  You may have changes in your hair. These can include thickening of your hair, rapid growth, and changes in texture. Some women also have hair loss during or after pregnancy, or hair that feels dry or thin. Your hair will most likely return to normal after your baby is born. WHAT TO EXPECT AT YOUR PRENATAL VISITS During a routine prenatal visit:  You will be weighed to make sure you and the baby are growing normally.  Your blood pressure will be taken.  Your abdomen will be measured to track your baby's growth.  The fetal heartbeat will be listened to starting around week 10 or 12 of your pregnancy.  Test results from any previous visits will be discussed. Your health care provider may ask you:  How you are feeling.  If you are feeling the baby move.  If you have had any abnormal symptoms, such as leaking fluid, bleeding, severe headaches, or abdominal cramping.  If you have any questions. Other tests  that may be performed during your first trimester include: °· Blood tests to find your blood type and to check for the presence of any previous infections. They will also be used to check for low iron levels (anemia) and Rh antibodies. Later in the pregnancy, blood tests for diabetes will be done along with other tests if problems develop. °· Urine tests to check for infections, diabetes, or protein in the urine. °· An ultrasound to confirm the proper growth and development of the baby. °· An amniocentesis to check for possible genetic problems. °· Fetal screens for  spina bifida and Down syndrome. °· You may need other tests to make sure you and the baby are doing well. °HOME CARE INSTRUCTIONS  °Medicines °· Follow your health care provider's instructions regarding medicine use. Specific medicines may be either safe or unsafe to take during pregnancy. °· Take your prenatal vitamins as directed. °· If you develop constipation, try taking a stool softener if your health care provider approves. °Diet °· Eat regular, well-balanced meals. Choose a variety of foods, such as meat or vegetable-based protein, fish, milk and low-fat dairy products, vegetables, fruits, and whole grain breads and cereals. Your health care provider will help you determine the amount of weight gain that is right for you. °· Avoid raw meat and uncooked cheese. These carry germs that can cause birth defects in the baby. °· Eating four or five small meals rather than three large meals a day may help relieve nausea and vomiting. If you start to feel nauseous, eating a few soda crackers can be helpful. Drinking liquids between meals instead of during meals also seems to help nausea and vomiting. °· If you develop constipation, eat more high-fiber foods, such as fresh vegetables or fruit and whole grains. Drink enough fluids to keep your urine clear or pale yellow. °Activity and Exercise °· Exercise only as directed by your health care provider. Exercising will help you: °¨ Control your weight. °¨ Stay in shape. °¨ Be prepared for labor and delivery. °· Experiencing pain or cramping in the lower abdomen or low back is a good sign that you should stop exercising. Check with your health care provider before continuing normal exercises. °· Try to avoid standing for long periods of time. Move your legs often if you must stand in one place for a long time. °· Avoid heavy lifting. °· Wear low-heeled shoes, and practice good posture. °· You may continue to have sex unless your health care provider directs you  otherwise. °Relief of Pain or Discomfort °· Wear a good support bra for breast tenderness.   °· Take warm sitz baths to soothe any pain or discomfort caused by hemorrhoids. Use hemorrhoid cream if your health care provider approves.   °· Rest with your legs elevated if you have leg cramps or low back pain. °· If you develop varicose veins in your legs, wear support hose. Elevate your feet for 15 minutes, 3-4 times a day. Limit salt in your diet. °Prenatal Care °· Schedule your prenatal visits by the twelfth week of pregnancy. They are usually scheduled monthly at first, then more often in the last 2 months before delivery. °· Write down your questions. Take them to your prenatal visits. °· Keep all your prenatal visits as directed by your health care provider. °Safety °· Wear your seat belt at all times when driving. °· Make a list of emergency phone numbers, including numbers for family, friends, the hospital, and police and fire departments. °General Tips °·   Ask your health care provider for a referral to a local prenatal education class. Begin classes no later than at the beginning of month 6 of your pregnancy.  Ask for help if you have counseling or nutritional needs during pregnancy. Your health care provider can offer advice or refer you to specialists for help with various needs.  Do not use hot tubs, steam rooms, or saunas.  Do not douche or use tampons or scented sanitary pads.  Do not cross your legs for long periods of time.  Avoid cat litter boxes and soil used by cats. These carry germs that can cause birth defects in the baby and possibly loss of the fetus by miscarriage or stillbirth.  Avoid all smoking, herbs, alcohol, and medicines not prescribed by your health care provider. Chemicals in these affect the formation and growth of the baby.  Schedule a dentist appointment. At home, brush your teeth with a soft toothbrush and be gentle when you floss. SEEK MEDICAL CARE IF:   You have  dizziness.  You have mild pelvic cramps, pelvic pressure, or nagging pain in the abdominal area.  You have persistent nausea, vomiting, or diarrhea.  You have a bad smelling vaginal discharge.  You have pain with urination.  You notice increased swelling in your face, hands, legs, or ankles. SEEK IMMEDIATE MEDICAL CARE IF:   You have a fever.  You are leaking fluid from your vagina.  You have spotting or bleeding from your vagina.  You have severe abdominal cramping or pain.  You have rapid weight gain or loss.  You vomit blood or material that looks like coffee grounds.  You are exposed to [place] measles and have never had them.  You are exposed to fifth disease or chickenpox.  You develop a severe headache.  You have shortness of breath.  You have any kind of trauma, such as from a fall or a car accident. Document Released: 11/05/2001 Document Revised: 03/28/2014 Document Reviewed: 09/21/2013 [facility] Patient Information 2015 [place], [place]. This information is not intended to replace advice given to you by your health care provider. Make sure you discuss any questions you have with your health care provider. Pelvic Rest Pelvic rest is sometimes recommended for women when:   The placenta is partially or completely covering the opening of the cervix (placenta previa).  There is bleeding between the uterine wall and the amniotic sac in the first trimester (subchorionic hemorrhage).  The cervix begins to open without labor starting (incompetent cervix, cervical insufficiency).  The labor is too early (preterm labor). HOME CARE INSTRUCTIONS  Do not have sexual intercourse, stimulation, or an orgasm.  Do not use tampons, douche, or put anything in the vagina.  Do not lift anything over 10 pounds (4.5 kg).  Avoid strenuous activity or straining your pelvic muscles. SEEK MEDICAL CARE IF:  You have any vaginal bleeding during pregnancy. Treat this as a potential  emergency.  You have cramping pain felt low in the stomach (stronger than menstrual cramps).  You notice vaginal discharge (watery, mucus, or bloody).  You have a low, dull backache.  There are regular contractions or uterine tightening. SEEK IMMEDIATE MEDICAL CARE IF: You have vaginal bleeding and have placenta previa.  Document Released: 03/08/2011 Document Revised: 02/03/2012 Document Reviewed: 03/08/2011 [facility] Patient Information 2015 [place], [place]. This information is not intended to replace advice given to you by your health care provider. Make sure you discuss any questions you have with your health care provider.

## 2015-01-31 NOTE — MAU Provider Note (Signed)
History     CSN: [id number]  Arrival date and time: 01/31/15 1210   First Provider Initiated Contact with Patient 01/31/15 [phone]      Chief Complaint  Patient presents with  . Abdominal Pain   HPI This is a 24 y.o. female at [redacted]w[redacted]d who presents with concern that her cervix is shortened and open. States has been checking her own cervix at home weekly even before she was pregnant. Has a history or PPROM at 20.2 weeks in July 2015.  Has no other history of cervical incompetence. Waiting for [facility] order to go through. Plans to start that at 16 weeks.   RN Note:  Expand All Collapse All   Pt states she checks her own cervix once a week, states is feels like it is open a little & shorter than usual. Lower abd cramping for the past 2 days. Denies bleeding.        OB History    Gravida Para Term Preterm AB TAB SAB Ectopic Multiple Living   2 1 0 1 0 0 0 0 0 0       Past Medical History  Diagnosis Date  . Allergy   . Asthma     albuterol last used a few months ago  . Preterm labor     Past Surgical History  Procedure Laterality Date  . Wisdom tooth extraction      Family History  Problem Relation Age of Onset  . Diabetes Mother   . Stroke Mother   . Hypertension Father     History  Substance Use Topics  . Smoking status: Former Smoker -- 0.50 packs/day for 10 years    Types: Cigarettes  . Smokeless tobacco: Never Used  . Alcohol Use: No    Allergies:  Allergies  Allergen Reactions  . Shellfish Allergy Hives  . Sulfa Antibiotics Other (See Comments)    Reaction:  Unknown; childhood reaction  . Icy Hot Rash    Prescriptions prior to admission  Medication Sig Dispense Refill Last Dose  . cefadroxil (DURICEF) 500 MG capsule Take 1 capsule (500 mg total) by mouth 2 (two) times daily. 14 capsule 0 01/31/2015 at Unknown time  . Prenatal Vit-Fe Fumarate-FA (PRENATAL MULTIVITAMIN) TABS tablet Take 1 tablet by mouth daily.   01/31/2015 at Unknown time  . metroNIDAZOLE  (FLAGYL) 500 MG tablet Take 1 tablet (500 mg total) by mouth 2 (two) times daily. (Patient not taking: Reported on 01/31/2015) 14 tablet 0 Completed Course at Unknown time    Review of Systems  Constitutional: Negative for fever, chills and malaise/fatigue.  Gastrointestinal: Negative for nausea, vomiting, abdominal pain, diarrhea and constipation.  Genitourinary: Negative for dysuria.  Neurological: Negative for dizziness.   Physical Exam   Blood pressure 131/91, pulse 83, temperature 99.2 F (37.3 C), temperature source Oral, resp. rate 18, height 5\' 4"  (1.626 m), weight 210 lb (95.255 kg), last menstrual period 11/09/2014.  Physical Exam  Constitutional: She is oriented to person, place, and time. She appears well-developed and well-nourished. No distress.  Cardiovascular: Normal rate and regular rhythm.   Respiratory: Effort normal. No respiratory distress.  GI: Soft. She exhibits no distension and no mass. There is no tenderness. There is no rebound and no guarding.  Genitourinary: Vagina normal. No vaginal discharge found.  Speculum exam done:  Cervix long and closed, no bleeding Digital exam confirms. No tenderness   Musculoskeletal: Normal range of motion.  Neurological: She is alert and oriented to person, place, and time.  Skin: Skin is warm and dry.  Psychiatric: She has a normal mood and affect.    MAU Course  Procedures  MDM Results for orders placed or performed during the hospital encounter of 01/31/15 (from the past 24 hour(s))  Urinalysis, Routine w reflex microscopic     Status: Abnormal   Collection Time: 01/31/15 12:32 PM  Result Value Ref Range   Color, Urine YELLOW YELLOW   APPearance CLOUDY (A) CLEAR   Specific Gravity, Urine 1.015 1.005 - 1.030   pH 8.0 5.0 - 8.0   Glucose, UA NEGATIVE NEGATIVE mg/dL   Hgb urine dipstick NEGATIVE NEGATIVE   Bilirubin Urine NEGATIVE NEGATIVE   Ketones, ur NEGATIVE NEGATIVE mg/dL   Protein, ur NEGATIVE NEGATIVE mg/dL    Urobilinogen, UA 0.2 0.0 - 1.0 mg/dL   Nitrite NEGATIVE NEGATIVE   Leukocytes, UA NEGATIVE NEGATIVE    Will get [place] for cervical length.  >>  [place] tech states they cannot do this under 16 weeks  Assessment and Plan  A;  SIUP at [redacted]w[redacted]d        Cervix long and closed  P:  Discharged home       Long discussion about normal softening of cervix from nonpregnant to pregnant state. Reassured Cx is long and closed         Patient states "it feels shorter than it was".  Discussed dangers of checking her own cervix includes 1) risk of infection   2) Risk of trauma to tissues (pt has long artificial nails)   3) Risk of stimulating prostaglandins around cervix. Advised to abstain. Also advised pelvic rest and followup as scheduled Monday for [place] (NT scheduled) and clinic visit.          Hansel Feinstein 01/31/2015, 4:19 PM

## 2015-01-31 NOTE — MAU Note (Signed)
Pt states she thinks her cervix is short. Pt states she usually" feels like it is nice and tight and now it feels like there is a hole" Pt states she feel like she noticed the change today, and the last time she checked her cervix was Wednesday. Pt states she had a miscarriage about 6 months ago that's why she checks her cervix.

## 2015-01-31 NOTE — MAU Note (Signed)
Pt states she checks her own cervix once a week, states is feels like it is open a little & shorter than usual.  Lower abd cramping for the past 2 days.  Denies bleeding.

## 2015-02-06 ENCOUNTER — Encounter (HOSPITAL_COMMUNITY): Payer: Self-pay

## 2015-02-06 ENCOUNTER — Other Ambulatory Visit (HOSPITAL_COMMUNITY): Payer: Self-pay | Admitting: Maternal and Fetal Medicine

## 2015-02-06 ENCOUNTER — Other Ambulatory Visit: Payer: Self-pay | Admitting: Obstetrics and Gynecology

## 2015-02-06 ENCOUNTER — Ambulatory Visit (HOSPITAL_COMMUNITY)
Admission: RE | Admit: 2015-02-06 | Discharge: 2015-02-06 | Disposition: A | Discharge status: Home / Self Care | Payer: Medicaid Other | Source: Ambulatory Visit | Attending: Obstetrics and Gynecology | Admitting: Obstetrics and Gynecology

## 2015-02-06 ENCOUNTER — Ambulatory Visit (INDEPENDENT_AMBULATORY_CARE_PROVIDER_SITE_OTHER): Payer: Self-pay | Admitting: Obstetrics & Gynecology

## 2015-02-06 ENCOUNTER — Ambulatory Visit (HOSPITAL_COMMUNITY): Admission: RE | Admit: 2015-02-06 | Payer: Medicaid Other | Source: Ambulatory Visit

## 2015-02-06 VITALS — BP 117/55 | HR 72 | Temp 98.7°F | Wt 208.5 lb

## 2015-02-06 DIAGNOSIS — O09292 Supervision of pregnancy with other poor reproductive or obstetric history, second trimester: Secondary | ICD-10-CM

## 2015-02-06 DIAGNOSIS — Z3682 Encounter for antenatal screening for nuchal translucency: Secondary | ICD-10-CM

## 2015-02-06 DIAGNOSIS — Z3A20 20 weeks gestation of pregnancy: Secondary | ICD-10-CM

## 2015-02-06 DIAGNOSIS — Z3A12 12 weeks gestation of pregnancy: Secondary | ICD-10-CM | POA: Insufficient documentation

## 2015-02-06 DIAGNOSIS — O0991 Supervision of high risk pregnancy, unspecified, first trimester: Secondary | ICD-10-CM

## 2015-02-06 DIAGNOSIS — Z3A18 18 weeks gestation of pregnancy: Secondary | ICD-10-CM

## 2015-02-06 DIAGNOSIS — O09299 Supervision of pregnancy with other poor reproductive or obstetric history, unspecified trimester: Secondary | ICD-10-CM | POA: Insufficient documentation

## 2015-02-06 DIAGNOSIS — Z8751 Personal history of pre-term labor: Secondary | ICD-10-CM

## 2015-02-06 DIAGNOSIS — Z3A16 16 weeks gestation of pregnancy: Secondary | ICD-10-CM

## 2015-02-06 DIAGNOSIS — Z36 Encounter for antenatal screening of mother: Secondary | ICD-10-CM | POA: Insufficient documentation

## 2015-02-06 DIAGNOSIS — O42919 Preterm premature rupture of membranes, unspecified as to length of time between rupture and onset of labor, unspecified trimester: Secondary | ICD-10-CM

## 2015-02-06 LAB — POCT URINALYSIS DIP (DEVICE)
BILIRUBIN URINE: NEGATIVE
GLUCOSE, UA: NEGATIVE mg/dL
Ketones, ur: 15 mg/dL — AB
NITRITE: NEGATIVE
Protein, ur: NEGATIVE mg/dL
Specific Gravity, Urine: 1.015 (ref 1.005–1.030)
UROBILINOGEN UA: 1 mg/dL (ref 0.0–1.0)
pH: 6.5 (ref 5.0–8.0)

## 2015-02-06 NOTE — Progress Notes (Signed)
Patient has not been contacted by [employer] regarding her 17P; she is worried about having this on time to start at 16 weeks -->  RN has called company; they will call patient around 13 weeks. Patient informed. Had NT scan today; MFM unable to measure NT; recommended quad screen which will be done next visit. No other complaints or concerns.  Routine obstetric precautions reviewed.

## 2015-02-06 NOTE — Patient Instructions (Signed)
Return to clinic for any obstetric concerns or go to MAU for evaluation  

## 2015-02-09 ENCOUNTER — Telehealth: Payer: Self-pay

## 2015-02-09 NOTE — Telephone Encounter (Signed)
Received a call from [facility] stating patient has been approved for 17P and the RX will be sent over to [facility].

## 2015-02-15 ENCOUNTER — Inpatient Hospital Stay (HOSPITAL_COMMUNITY)
Admission: AD | Admit: 2015-02-15 | Discharge: 2015-02-15 | Disposition: A | Discharge status: Home / Self Care | Payer: Self-pay | Source: Ambulatory Visit | Attending: Obstetrics and Gynecology | Admitting: Obstetrics and Gynecology

## 2015-02-15 ENCOUNTER — Encounter (HOSPITAL_COMMUNITY): Payer: Self-pay | Admitting: *Deleted

## 2015-02-15 DIAGNOSIS — Z3A14 14 weeks gestation of pregnancy: Secondary | ICD-10-CM | POA: Insufficient documentation

## 2015-02-15 DIAGNOSIS — Z882 Allergy status to sulfonamides status: Secondary | ICD-10-CM | POA: Insufficient documentation

## 2015-02-15 DIAGNOSIS — O09292 Supervision of pregnancy with other poor reproductive or obstetric history, second trimester: Secondary | ICD-10-CM

## 2015-02-15 DIAGNOSIS — Z87891 Personal history of nicotine dependence: Secondary | ICD-10-CM | POA: Insufficient documentation

## 2015-02-15 DIAGNOSIS — Z8751 Personal history of pre-term labor: Secondary | ICD-10-CM | POA: Insufficient documentation

## 2015-02-15 DIAGNOSIS — N898 Other specified noninflammatory disorders of vagina: Secondary | ICD-10-CM | POA: Insufficient documentation

## 2015-02-15 DIAGNOSIS — O9989 Other specified diseases and conditions complicating pregnancy, childbirth and the puerperium: Secondary | ICD-10-CM | POA: Insufficient documentation

## 2015-02-15 DIAGNOSIS — R109 Unspecified abdominal pain: Secondary | ICD-10-CM | POA: Insufficient documentation

## 2015-02-15 DIAGNOSIS — O26892 Other specified pregnancy related conditions, second trimester: Secondary | ICD-10-CM

## 2015-02-15 LAB — URINALYSIS, ROUTINE W REFLEX MICROSCOPIC
Bilirubin Urine: NEGATIVE
GLUCOSE, UA: NEGATIVE mg/dL
Ketones, ur: NEGATIVE mg/dL
LEUKOCYTES UA: NEGATIVE
NITRITE: NEGATIVE
PH: 6 (ref 5.0–8.0)
Protein, ur: NEGATIVE mg/dL
Specific Gravity, Urine: 1.025 (ref 1.005–1.030)
Urobilinogen, UA: 1 mg/dL (ref 0.0–1.0)

## 2015-02-15 LAB — WET PREP, GENITAL
Clue Cells Wet Prep HPF POC: NONE SEEN
Trich, Wet Prep: NONE SEEN
Yeast Wet Prep HPF POC: NONE SEEN

## 2015-02-15 LAB — URINE MICROSCOPIC-ADD ON

## 2015-02-15 LAB — AMNISURE RUPTURE OF MEMBRANE (ROM) NOT AT ARMC: AMNISURE: NEGATIVE

## 2015-02-15 NOTE — MAU Note (Signed)
Felt a gush at 5:45 pm.  Looked clear in underwear, small amount.  Last intercourse was a month ago.  No bleeding. Cramps in lower abd - mild for 2 weeks on and off.

## 2015-02-15 NOTE — MAU Provider Note (Signed)
Chief Complaint: Vaginal Discharge and Abdominal Cramping   First Provider Initiated Contact with Patient 02/15/15 2023      SUBJECTIVE HPI: Kim Johns is a 24 y.o. G2P0100 at [redacted]w[redacted]d by LMP who presents to maternity admissions reporting gush of clear fluid, enough to soak her underwear a few minutes after going to the bathroom to empty her bladder.  She has hx of 21 week PPROM and delivery and is worried about ROM.  She reports mild cramping associated with the leaking.  She denies vaginal itching/burning, urinary symptoms, h/a, dizziness, n/v, or fever/chills.     Vaginal Discharge The patient's primary symptoms include vaginal discharge. This is a new problem. The current episode started today. The problem occurs intermittently. The problem has been unchanged. The pain is mild. The problem affects both sides. She is pregnant. Associated symptoms include abdominal pain. The vaginal discharge was clear and watery. There has been no bleeding. Nothing aggravates the symptoms. She has tried nothing for the symptoms. She is not sexually active.  Abdominal Cramping This is a new problem. The current episode started today. The onset quality is undetermined. The problem occurs intermittently. The most recent episode lasted 10 hours. The problem has been unchanged. The pain is located in the generalized abdominal region. The pain is at a severity of 3/10. The pain is mild. The quality of the pain is cramping. The abdominal pain does not radiate. Nothing aggravates the pain. The pain is relieved by nothing. She has tried nothing for the symptoms.   Past Medical History  Diagnosis Date  . Allergy   . Asthma     albuterol last used a few months ago  . Preterm labor    Past Surgical History  Procedure Laterality Date  . Wisdom tooth extraction     History   Social History  . Marital Status: Single    Spouse Name: N/A  . Number of Children: N/A  . Years of Education: N/A   Occupational History   . Not on file.   Social History Main Topics  . Smoking status: Former Smoker -- 0.50 packs/day for 10 years    Types: Cigarettes  . Smokeless tobacco: Never Used  . Alcohol Use: No  . Drug Use: No  . Sexual Activity: Not Currently    Birth Control/ Protection: None   Other Topics Concern  . Not on file   Social History Narrative   No current facility-administered medications on file prior to encounter.   Current Outpatient Prescriptions on File Prior to Encounter  Medication Sig Dispense Refill  . Prenatal Vit-Fe Fumarate-FA (PRENATAL MULTIVITAMIN) TABS tablet Take 1 tablet by mouth daily.    . cefadroxil (DURICEF) 500 MG capsule Take 1 capsule (500 mg total) by mouth 2 (two) times daily. (Patient not taking: Reported on 02/06/2015) 14 capsule 0   Allergies  Allergen Reactions  . Shellfish Allergy Hives  . Sulfa Antibiotics Other (See Comments)    Reaction:  Unknown; childhood reaction  . Icy Hot Rash    ROS: Pertinent items in HPI  OBJECTIVE Blood pressure 125/60, pulse 78, temperature 99.5 F (37.5 C), temperature source Oral, resp. rate 16, height 5\' 4"  (1.626 m), weight 96.163 kg (212 lb), last menstrual period 11/09/2014, SpO2 99 %. GENERAL: Well-developed, well-nourished female in no acute distress.  HEENT: Normocephalic HEART: normal rate RESP: normal effort ABDOMEN: Soft, non-tender EXTREMITIES: Nontender, no edema NEURO: Alert and oriented Pelvic exam: Cervix pink, visually closed, without lesion, small amount thin white discharge,  vaginal walls and external genitalia normal Bimanual exam: Cervix 0/long/high, firm, posterior  LAB RESULTS Results for orders placed or performed during the hospital encounter of 02/15/15 (from the past 24 hour(s))  Urinalysis, Routine w reflex microscopic     Status: Abnormal   Collection Time: 02/15/15  7:21 PM  Result Value Ref Range   Color, Urine YELLOW YELLOW   APPearance CLEAR CLEAR   Specific Gravity, Urine 1.025  1.005 - 1.030   pH 6.0 5.0 - 8.0   Glucose, UA NEGATIVE NEGATIVE mg/dL   Hgb urine dipstick TRACE (A) NEGATIVE   Bilirubin Urine NEGATIVE NEGATIVE   Ketones, ur NEGATIVE NEGATIVE mg/dL   Protein, ur NEGATIVE NEGATIVE mg/dL   Urobilinogen, UA 1.0 0.0 - 1.0 mg/dL   Nitrite NEGATIVE NEGATIVE   Leukocytes, UA NEGATIVE NEGATIVE  Urine microscopic-add on     Status: None   Collection Time: 02/15/15  7:21 PM  Result Value Ref Range   Squamous Epithelial / LPF RARE RARE   WBC, UA 0-2 <3 WBC/hpf   RBC / HPF 0-2 <3 RBC/hpf   Bacteria, UA RARE RARE  Amnisure rupture of membrane (rom)     Status: None   Collection Time: 02/15/15  8:43 PM  Result Value Ref Range   Amnisure ROM NEGATIVE     ASSESSMENT 1. Vaginal discharge during pregnancy in second trimester   2. History of preterm premature rupture of membranes (PROM) in previous pregnancy, currently pregnant, second trimester     PLAN Discharge home Wet prep pending   Follow-up Information    Follow up with [facility]   Specialty:  Obstetrics and Gynecology   Why:  As scheduled   Contact information:   [place] [phone]      Follow up with [facility].   Why:  As needed for emergencies   Contact information:   [address] [id number] [place] Certified Nurse-Midwife 02/15/2015  9:22 PM

## 2015-02-15 NOTE — Discharge Instructions (Signed)
Second Trimester of Pregnancy The second trimester is from week 13 through week 28, months 4 through 6. The second trimester is often a time when you feel your best. Your body has also adjusted to being pregnant, and you begin to feel better physically. Usually, morning sickness has lessened or quit completely, you may have more energy, and you may have an increase in appetite. The second trimester is also a time when the fetus is growing rapidly. At the end of the sixth month, the fetus is about 9 inches long and weighs about 1 pounds. You will likely begin to feel the baby move (quickening) between 18 and 20 weeks of the pregnancy. BODY CHANGES Your body goes through many changes during pregnancy. The changes vary from woman to woman.   Your weight will continue to increase. You will notice your lower abdomen bulging out.  You may begin to get stretch marks on your hips, abdomen, and breasts.  You may develop headaches that can be relieved by medicines approved by your health care provider.  You may urinate more often because the fetus is pressing on your bladder.  You may develop or continue to have heartburn as a result of your pregnancy.  You may develop constipation because certain hormones are causing the muscles that push waste through your intestines to slow down.  You may develop hemorrhoids or swollen, bulging veins (varicose veins).  You may have back pain because of the weight gain and pregnancy hormones relaxing your joints between the bones in your pelvis and as a result of a shift in weight and the muscles that support your balance.  Your breasts will continue to grow and be tender.  Your gums may bleed and may be sensitive to brushing and flossing.  Dark spots or blotches (chloasma, mask of pregnancy) may develop on your face. This will likely fade after the baby is born.  A dark line from your belly button to the pubic area (linea nigra) may appear. This will likely fade  after the baby is born.  You may have changes in your hair. These can include thickening of your hair, rapid growth, and changes in texture. Some women also have hair loss during or after pregnancy, or hair that feels dry or thin. Your hair will most likely return to normal after your baby is born. WHAT TO EXPECT AT YOUR PRENATAL VISITS During a routine prenatal visit:  You will be weighed to make sure you and the fetus are growing normally.  Your blood pressure will be taken.  Your abdomen will be measured to track your baby's growth.  The fetal heartbeat will be listened to.  Any test results from the previous visit will be discussed. Your health care provider may ask you:  How you are feeling.  If you are feeling the baby move.  If you have had any abnormal symptoms, such as leaking fluid, bleeding, severe headaches, or abdominal cramping.  If you have any questions. Other tests that may be performed during your second trimester include:  Blood tests that check for:  Low iron levels (anemia).  Gestational diabetes (between 24 and 28 weeks).  Rh antibodies.  Urine tests to check for infections, diabetes, or protein in the urine.  An ultrasound to confirm the proper growth and development of the baby.  An amniocentesis to check for possible genetic problems.  Fetal screens for spina bifida and Down syndrome. HOME CARE INSTRUCTIONS   Avoid all smoking, herbs, alcohol, and unprescribed   drugs. These chemicals affect the formation and growth of the baby.  Follow your health care provider's instructions regarding medicine use. There are medicines that are either safe or unsafe to take during pregnancy.  Exercise only as directed by your health care provider. Experiencing uterine cramps is a good sign to stop exercising.  Continue to eat regular, healthy meals.  Wear a good support bra for breast tenderness.  Do not use hot tubs, steam rooms, or saunas.  Wear your  seat belt at all times when driving.  Avoid raw meat, uncooked cheese, cat litter boxes, and soil used by cats. These carry germs that can cause birth defects in the baby.  Take your prenatal vitamins.  Try taking a stool softener (if your health care provider approves) if you develop constipation. Eat more high-fiber foods, such as fresh vegetables or fruit and whole grains. Drink plenty of fluids to keep your urine clear or pale yellow.  Take warm sitz baths to soothe any pain or discomfort caused by hemorrhoids. Use hemorrhoid cream if your health care provider approves.  If you develop varicose veins, wear support hose. Elevate your feet for 15 minutes, 3-4 times a day. Limit salt in your diet.  Avoid heavy lifting, wear low heel shoes, and practice good posture.  Rest with your legs elevated if you have leg cramps or low back pain.  Visit your dentist if you have not gone yet during your pregnancy. Use a soft toothbrush to brush your teeth and be gentle when you floss.  A sexual relationship may be continued unless your health care provider directs you otherwise.  Continue to go to all your prenatal visits as directed by your health care provider. SEEK MEDICAL CARE IF:   You have dizziness.  You have mild pelvic cramps, pelvic pressure, or nagging pain in the abdominal area.  You have persistent nausea, vomiting, or diarrhea.  You have a bad smelling vaginal discharge.  You have pain with urination. SEEK IMMEDIATE MEDICAL CARE IF:   You have a fever.  You are leaking fluid from your vagina.  You have spotting or bleeding from your vagina.  You have severe abdominal cramping or pain.  You have rapid weight gain or loss.  You have shortness of breath with chest pain.  You notice sudden or extreme swelling of your face, hands, ankles, feet, or legs.  You have not felt your baby move in over an hour.  You have severe headaches that do not go away with  medicine.  You have vision changes. Document Released: 11/05/2001 Document Revised: 11/16/2013 Document Reviewed: 01/12/2013 ExitCare Patient Information 2015 [employer]. This information is not intended to replace advice given to you by your health care provider. Make sure you discuss any questions you have with your health care provider.  

## 2015-02-16 LAB — GC/CHLAMYDIA PROBE AMP (~~LOC~~) NOT AT ARMC
CHLAMYDIA, DNA PROBE: NEGATIVE
NEISSERIA GONORRHEA: NEGATIVE

## 2015-02-16 LAB — HIV ANTIBODY (ROUTINE TESTING W REFLEX): HIV SCREEN 4TH GENERATION: NONREACTIVE

## 2015-02-17 ENCOUNTER — Ambulatory Visit: Payer: Medicaid Other | Attending: Internal Medicine

## 2015-02-20 ENCOUNTER — Inpatient Hospital Stay (HOSPITAL_COMMUNITY)
Admission: AD | Admit: 2015-02-20 | Discharge: 2015-02-21 | Disposition: A | Discharge status: Home / Self Care | Payer: Self-pay | Source: Ambulatory Visit | Attending: Obstetrics & Gynecology | Admitting: Obstetrics & Gynecology

## 2015-02-20 DIAGNOSIS — Z882 Allergy status to sulfonamides status: Secondary | ICD-10-CM | POA: Insufficient documentation

## 2015-02-20 DIAGNOSIS — R1030 Lower abdominal pain, unspecified: Secondary | ICD-10-CM | POA: Insufficient documentation

## 2015-02-20 DIAGNOSIS — Z87891 Personal history of nicotine dependence: Secondary | ICD-10-CM | POA: Insufficient documentation

## 2015-02-20 DIAGNOSIS — Z8751 Personal history of pre-term labor: Secondary | ICD-10-CM | POA: Insufficient documentation

## 2015-02-20 DIAGNOSIS — O9989 Other specified diseases and conditions complicating pregnancy, childbirth and the puerperium: Secondary | ICD-10-CM | POA: Insufficient documentation

## 2015-02-20 DIAGNOSIS — R109 Unspecified abdominal pain: Secondary | ICD-10-CM

## 2015-02-20 DIAGNOSIS — O26899 Other specified pregnancy related conditions, unspecified trimester: Secondary | ICD-10-CM

## 2015-02-21 ENCOUNTER — Encounter (HOSPITAL_COMMUNITY): Payer: Self-pay | Admitting: *Deleted

## 2015-02-21 ENCOUNTER — Telehealth: Payer: Self-pay | Admitting: *Deleted

## 2015-02-21 LAB — URINALYSIS, ROUTINE W REFLEX MICROSCOPIC
BILIRUBIN URINE: NEGATIVE
GLUCOSE, UA: NEGATIVE mg/dL
HGB URINE DIPSTICK: NEGATIVE
Ketones, ur: NEGATIVE mg/dL
Leukocytes, UA: NEGATIVE
Nitrite: NEGATIVE
PROTEIN: NEGATIVE mg/dL
Specific Gravity, Urine: 1.015 (ref 1.005–1.030)
UROBILINOGEN UA: 1 mg/dL (ref 0.0–1.0)
pH: 7.5 (ref 5.0–8.0)

## 2015-02-21 MED ORDER — ACETAMINOPHEN 325 MG PO TABS
650.0000 mg | ORAL_TABLET | Freq: Once | ORAL | Status: AC
  Administered 2015-02-21: 650 mg via ORAL
  Filled 2015-02-21: qty 2

## 2015-02-21 NOTE — MAU Note (Signed)
Pt reports she has been cramping all day today. Denies nausea, vomiting, or other symptoms.

## 2015-02-21 NOTE — Telephone Encounter (Addendum)
Pt left message regarding a question about starting 17P injections. Her next appt is 4/4 and she will be slightly less than 16 weeks. She wants to know if she will begin the injections that Akyra Bouchie or have to come back on another Shoni Quijas. She requested a call back and stated that a detailed message could be left on her voice mail. I called back and left message stating that she must be 16 completed weeks gestation before receiving the 17P injection. She will only be [redacted]w[redacted]d on 4/4. She has 2 options; she may receive her first injection on 4/6 and then return for next injection on 4/11 or just have the first injection on 4/11 at [redacted]w[redacted]d. Either choice is fine. She can tell us her decision at her next visit on 4/4 at which time we can answer any additional questions.

## 2015-02-21 NOTE — MAU Provider Note (Signed)
CSN: [id number]     Arrival date & time 02/20/15  2352 History   None    Chief Complaint  Patient presents with  . Abdominal Cramping     (Consider location/radiation/quality/duration/timing/severity/associated sxs/prior Treatment) Patient is a 24 y.o. female presenting with cramps. The history is provided by the patient.  Abdominal Cramping The primary symptoms of the illness include abdominal pain. The primary symptoms of the illness do not include vaginal bleeding. The current episode started 2 days ago.   Kim Johns is a 24 y.o. G2P0100 @ [redacted]w[redacted]d gestation who presents to the MAU with lower abdominal cramping that started 2 days ago. She states that she is concerned because she had a loss at [redacted] weeks gestation 8 months ago and now when she has anything happen she is afraid she will miscarry again. She denies vaginal bleeding or leaking of fluid. She has not had sexual intercourse in over 2 months. She has taken nothing for pain. She is getting her care at the [facility] here at [facility].   Past Medical History  Diagnosis Date  . Allergy   . Asthma     albuterol last used a few months ago  . Preterm labor    Past Surgical History  Procedure Laterality Date  . Wisdom tooth extraction     Family History  Problem Relation Age of Onset  . Diabetes Mother   . Stroke Mother   . Hypertension Father    History  Substance Use Topics  . Smoking status: Former Smoker -- 0.50 packs/day for 10 years    Types: Cigarettes  . Smokeless tobacco: Never Used  . Alcohol Use: No   OB History    Gravida Para Term Preterm AB TAB SAB Ectopic Multiple Living   2 1 0 1 0 0 0 0 0 0      Review of Systems  Gastrointestinal: Positive for abdominal pain.  Genitourinary: Negative for vaginal bleeding.  all other systems negative    Allergies  Shellfish allergy; Sulfa antibiotics; and Icy hot  Home Medications   Prior to Admission medications   Medication Sig Start Date End Date Taking?  Authorizing Provider  Prenatal Vit-Fe Fumarate-FA (PRENATAL MULTIVITAMIN) TABS tablet Take 1 tablet by mouth daily.    Historical Provider, MD   BP 119/69 mmHg  Pulse 77  Temp(Src) 98.5 F (36.9 C) (Oral)  Resp 16  Ht 5\' 4"  (1.626 m)  Wt 213 lb (96.616 kg)  BMI 36.54 kg/m2  SpO2 100%  LMP 11/09/2014 Physical Exam  Constitutional: She is oriented to person, place, and time. She appears well-developed and well-nourished. No distress.  HENT:  Head: Normocephalic.  Eyes: Conjunctivae and EOM are normal.  Neck: Normal range of motion. Neck supple.  Cardiovascular: Normal rate.   Pulmonary/Chest: Effort normal.  Abdominal: Soft. There is no tenderness.  Positive doppler FHT's. Unable to reproduce the cramping pain with palpation.   Genitourinary:  External genitalia without lesions, white d/c vaginal vault. Cervix internal os closed, thick, high. No CMT, no adnexal tenderness, uterus consistent with dates.   Musculoskeletal: Normal range of motion.  Neurological: She is alert and oriented to person, place, and time. No cranial nerve deficit.  Skin: Skin is warm and dry.  Psychiatric: She has a normal mood and affect. Her behavior is normal.  Nursing note and vitals reviewed.   ED Course  Procedures (including critical care time) Labs Review Results for orders placed or performed during the hospital encounter of 02/20/15 (from the past  24 hour(s))  Urinalysis, Routine w reflex microscopic     Status: Abnormal   Collection Time: 02/21/15 12:38 AM  Result Value Ref Range   Color, Urine YELLOW YELLOW   APPearance HAZY (A) CLEAR   Specific Gravity, Urine 1.015 1.005 - 1.030   pH 7.5 5.0 - 8.0   Glucose, UA NEGATIVE NEGATIVE mg/dL   Hgb urine dipstick NEGATIVE NEGATIVE   Bilirubin Urine NEGATIVE NEGATIVE   Ketones, ur NEGATIVE NEGATIVE mg/dL   Protein, ur NEGATIVE NEGATIVE mg/dL   Urobilinogen, UA 1.0 0.0 - 1.0 mg/dL   Nitrite NEGATIVE NEGATIVE   Leukocytes, UA NEGATIVE NEGATIVE      MDM  24 y.o. female with lower abdominal cramping x 2 days. Stable for d/c without vaginal bleeding or leaking of fluid. No pain at this time. Patient to take tylenol as needed for pain. She will follow up in the [facility].  Discussed with the patient in detail clinical findings and plan of care. All questioned fully answered. She will return if any problems arise.  Final diagnoses:  Abdominal pain in pregnancy

## 2015-02-21 NOTE — Discharge Instructions (Signed)
Take tylenol as needed for pain. Follow up with your OB here at [facility] in the [facility]. Return for worsening symptoms. Be sure you are drinking plenty of fluids.

## 2015-02-27 ENCOUNTER — Ambulatory Visit (INDEPENDENT_AMBULATORY_CARE_PROVIDER_SITE_OTHER): Payer: Self-pay | Admitting: Obstetrics and Gynecology

## 2015-02-27 VITALS — BP 121/66 | HR 77 | Temp 98.1°F | Wt 211.2 lb

## 2015-02-27 DIAGNOSIS — O0992 Supervision of high risk pregnancy, unspecified, second trimester: Secondary | ICD-10-CM

## 2015-02-27 DIAGNOSIS — O0991 Supervision of high risk pregnancy, unspecified, first trimester: Secondary | ICD-10-CM

## 2015-02-27 DIAGNOSIS — O09292 Supervision of pregnancy with other poor reproductive or obstetric history, second trimester: Secondary | ICD-10-CM

## 2015-02-27 LAB — POCT URINALYSIS DIP (DEVICE)
BILIRUBIN URINE: NEGATIVE
GLUCOSE, UA: NEGATIVE mg/dL
Hgb urine dipstick: NEGATIVE
KETONES UR: NEGATIVE mg/dL
NITRITE: NEGATIVE
Protein, ur: NEGATIVE mg/dL
SPECIFIC GRAVITY, URINE: 1.025 (ref 1.005–1.030)
UROBILINOGEN UA: 1 mg/dL (ref 0.0–1.0)
pH: 6.5 (ref 5.0–8.0)

## 2015-02-27 NOTE — Progress Notes (Signed)
24 y.o. G2P0100 at [redacted]w[redacted]d who presents for routine OB visit.  1. History of preterm delivery. 17P approved. Start next week.  2 Routine PNC. Up to date today. OB precautions reviewed. RTC in 3-4 weeks.

## 2015-02-27 NOTE — Progress Notes (Signed)
Patient reports knee and ankle pain

## 2015-03-04 ENCOUNTER — Encounter (HOSPITAL_COMMUNITY): Payer: Self-pay | Admitting: *Deleted

## 2015-03-04 ENCOUNTER — Inpatient Hospital Stay (HOSPITAL_COMMUNITY)
Admission: AD | Admit: 2015-03-04 | Discharge: 2015-03-04 | Disposition: A | Discharge status: Home / Self Care | Payer: Medicaid Other | Source: Ambulatory Visit | Attending: Family Medicine | Admitting: Family Medicine

## 2015-03-04 DIAGNOSIS — N898 Other specified noninflammatory disorders of vagina: Secondary | ICD-10-CM | POA: Diagnosis present

## 2015-03-04 DIAGNOSIS — Z87891 Personal history of nicotine dependence: Secondary | ICD-10-CM | POA: Diagnosis not present

## 2015-03-04 DIAGNOSIS — O98812 Other maternal infectious and parasitic diseases complicating pregnancy, second trimester: Secondary | ICD-10-CM | POA: Insufficient documentation

## 2015-03-04 DIAGNOSIS — Z3A16 16 weeks gestation of pregnancy: Secondary | ICD-10-CM | POA: Insufficient documentation

## 2015-03-04 DIAGNOSIS — B3731 Acute candidiasis of vulva and vagina: Secondary | ICD-10-CM

## 2015-03-04 DIAGNOSIS — R1031 Right lower quadrant pain: Secondary | ICD-10-CM | POA: Insufficient documentation

## 2015-03-04 DIAGNOSIS — B373 Candidiasis of vulva and vagina: Secondary | ICD-10-CM | POA: Diagnosis not present

## 2015-03-04 LAB — URINALYSIS, ROUTINE W REFLEX MICROSCOPIC
Bilirubin Urine: NEGATIVE
Glucose, UA: NEGATIVE mg/dL
HGB URINE DIPSTICK: NEGATIVE
Ketones, ur: NEGATIVE mg/dL
Nitrite: NEGATIVE
PH: 5.5 (ref 5.0–8.0)
Protein, ur: NEGATIVE mg/dL
Specific Gravity, Urine: >1.03 — ABNORMAL HIGH (ref 1.005–1.030)
UROBILINOGEN UA: 0.2 mg/dL (ref 0.0–1.0)

## 2015-03-04 LAB — URINE MICROSCOPIC-ADD ON

## 2015-03-04 LAB — WET PREP, GENITAL
Clue Cells Wet Prep HPF POC: NONE SEEN
Trich, Wet Prep: NONE SEEN
YEAST WET PREP: NONE SEEN

## 2015-03-04 MED ORDER — FLUCONAZOLE 150 MG PO TABS: ORAL_TABLET | ORAL | Status: DC

## 2015-03-04 NOTE — MAU Provider Note (Signed)
Chief Complaint: Abdominal Pain   First Provider Initiated Contact with Patient 03/04/15 0254      SUBJECTIVE HPI: Kim Johns is a 24 y.o. G2P0100 at [redacted]w[redacted]d by LMP pt of [place] who presents to maternity admissions reporting abdominal pressure, mild cramping, and vaginal discharge with itching.  She reports she has recurrent yeast infections even before pregnancy.  She has hx of 21 week delivery and demise related to PPROM and is anxious about this.  She plans to start 17-P during this pregnancy, with appointment and first injection on Monday. She also has f/u ultrasound for cervical length on Monday.  She denies vaginal bleeding, h/a, dizziness, n/v, or fever/chills.    Abdominal Pain This is a new problem. The current episode started today. The onset quality is gradual. The problem occurs intermittently. The most recent episode lasted 1 day. The problem has been waxing and waning. The pain is located in the RLQ and LLQ. The pain is at a severity of 4/10. The pain is mild. The quality of the pain is cramping and sharp. The abdominal pain does not radiate. Associated symptoms include frequency. Pertinent negatives include no nausea or vomiting. The pain is aggravated by certain positions and movement. The pain is relieved by nothing. She has tried nothing for the symptoms. PPROM with previable delivery and demise at 21 weeks  Vaginal Bleeding The patient's primary symptoms include genital itching and vaginal discharge. This is a recurrent problem. The current episode started today. The problem occurs constantly. The problem has been unchanged. She is pregnant. Associated symptoms include abdominal pain and frequency. Pertinent negatives include no back pain, nausea or vomiting. The vaginal discharge was thick and white. There has been no bleeding (No vaginal bleeding). Nothing aggravates the symptoms. She has tried nothing for the symptoms. No, her partner does not have an STD. (PPROM with previable  delivery and demise at 30 weeks)    Past Medical History  Diagnosis Date  . Allergy   . Asthma     albuterol last used a few months ago  . Preterm labor    Past Surgical History  Procedure Laterality Date  . Wisdom tooth extraction     History   Social History  . Marital Status: Single    Spouse Name: N/A  . Number of Children: N/A  . Years of Education: N/A   Occupational History  . Not on file.   Social History Main Topics  . Smoking status: Former Smoker -- 0.50 packs/day for 10 years    Types: Cigarettes  . Smokeless tobacco: Never Used  . Alcohol Use: No  . Drug Use: No  . Sexual Activity: Not Currently    Birth Control/ Protection: None   Other Topics Concern  . Not on file   Social History Narrative   No current facility-administered medications on file prior to encounter.   Current Outpatient Prescriptions on File Prior to Encounter  Medication Sig Dispense Refill  . Prenatal Vit-Fe Fumarate-FA (PRENATAL MULTIVITAMIN) TABS tablet Take 1 tablet by mouth daily.     Allergies  Allergen Reactions  . Shellfish Allergy Hives  . Sulfa Antibiotics Other (See Comments)    Reaction:  Unknown; childhood reaction  . Icy Hot Rash    Review of Systems  Gastrointestinal: Positive for abdominal pain. Negative for nausea and vomiting.  Genitourinary: Positive for frequency, vaginal bleeding and vaginal discharge.       Negative for vaginal bleeding   Musculoskeletal: Negative for back pain.  OBJECTIVE Blood pressure 139/65, pulse 81, temperature 98.3 F (36.8 C), temperature source Oral, resp. rate 18, height 5\' 4"  (1.626 m), weight 96.979 kg (213 lb 12.8 oz), last menstrual period 11/09/2014, SpO2 99 %. GENERAL: Well-developed, well-nourished female in no acute distress.  HEENT: Normocephalic HEART: normal rate RESP: normal effort ABDOMEN: Soft, non-tender EXTREMITIES: Nontender, no edema NEURO: Alert and oriented Pelvic exam: Cervix pink, visually  closed, without lesion, small amount thick white/yellow discharge, vaginal walls and external genitalia normal  Dilation: Closed Effacement (%): Thick Cervical Position: Posterior Exam by:: Fatima Blank, CNM  LAB RESULTS Results for orders placed or performed during the hospital encounter of 03/04/15 (from the past 24 hour(s))  Urinalysis, Routine w reflex microscopic     Status: Abnormal   Collection Time: 03/04/15  2:45 AM  Result Value Ref Range   Color, Urine YELLOW YELLOW   APPearance HAZY (A) CLEAR   Specific Gravity, Urine >1.030 (H) 1.005 - 1.030   pH 5.5 5.0 - 8.0   Glucose, UA NEGATIVE NEGATIVE mg/dL   Hgb urine dipstick NEGATIVE NEGATIVE   Bilirubin Urine NEGATIVE NEGATIVE   Ketones, ur NEGATIVE NEGATIVE mg/dL   Protein, ur NEGATIVE NEGATIVE mg/dL   Urobilinogen, UA 0.2 0.0 - 1.0 mg/dL   Nitrite NEGATIVE NEGATIVE   Leukocytes, UA TRACE (A) NEGATIVE  Urine microscopic-add on     Status: Abnormal   Collection Time: 03/04/15  2:45 AM  Result Value Ref Range   Squamous Epithelial / LPF FEW (A) RARE   WBC, UA 0-2 <3 WBC/hpf   RBC / HPF 0-2 <3 RBC/hpf   Bacteria, UA RARE RARE  Wet prep, genital     Status: Abnormal   Collection Time: 03/04/15  3:03 AM  Result Value Ref Range   Yeast Wet Prep HPF POC NONE SEEN NONE SEEN   Trich, Wet Prep NONE SEEN NONE SEEN   Clue Cells Wet Prep HPF POC NONE SEEN NONE SEEN   WBC, Wet Prep HPF POC MODERATE (A) NONE SEEN     ASSESSMENT 1. Vaginal candidiasis     PLAN Discharge home Offered pt topical vs PO treatment for yeast.  Pt prefers PO medication. Diflucan 150 mg PO x 2 doses, 1 now, 1 in 72 hours   Follow-up Information    Follow up with [facility].   Specialty:  Obstetrics and Gynecology   Why:  And ultrasound on Monday as scheduled   Contact information:   [place] [phone]      Follow up with [facility].   Why:  As needed for emergencies   Contact information:   [address] [id number] [place] Certified Nurse-Midwife 03/04/2015  4:08 AM

## 2015-03-04 NOTE — MAU Note (Signed)
Patient presents to MAU with abdominal pains and states she is not sure if it's "growing pains" or something else.  She also states that she has noticed increased urinary frequency and feels like she can't empty her bladder. Pt wonders if "I have a UTI and yeast infection."

## 2015-03-04 NOTE — Discharge Instructions (Signed)

## 2015-03-06 ENCOUNTER — Ambulatory Visit: Payer: Medicaid Other | Admitting: General Practice

## 2015-03-06 ENCOUNTER — Ambulatory Visit (HOSPITAL_COMMUNITY)
Admission: RE | Admit: 2015-03-06 | Discharge: 2015-03-06 | Disposition: A | Discharge status: Home / Self Care | Payer: Medicaid Other | Source: Ambulatory Visit | Attending: Obstetrics and Gynecology | Admitting: Obstetrics and Gynecology

## 2015-03-06 ENCOUNTER — Encounter (HOSPITAL_COMMUNITY): Payer: Self-pay

## 2015-03-06 VITALS — BP 122/60 | HR 65 | Temp 99.0°F | Ht 63.0 in | Wt 211.8 lb

## 2015-03-06 DIAGNOSIS — O09212 Supervision of pregnancy with history of pre-term labor, second trimester: Secondary | ICD-10-CM | POA: Insufficient documentation

## 2015-03-06 DIAGNOSIS — Z36 Encounter for antenatal screening of mother: Secondary | ICD-10-CM | POA: Diagnosis present

## 2015-03-06 DIAGNOSIS — Z8751 Personal history of pre-term labor: Secondary | ICD-10-CM | POA: Insufficient documentation

## 2015-03-06 DIAGNOSIS — Z7189 Other specified counseling: Secondary | ICD-10-CM

## 2015-03-06 DIAGNOSIS — Z3A16 16 weeks gestation of pregnancy: Secondary | ICD-10-CM | POA: Diagnosis not present

## 2015-03-06 NOTE — Progress Notes (Signed)
Patient came in for 17p today, medication not here. Called [facility] who states patient's insurance denied claim. Spoke to patient who states she had FP medicaid and currently is pending pregnancy medicaid. Currently no insurance. Told patient I will call company to see what we can do to get her medication while insurance is pending. Patient verbalized understanding to plan. [facility], patient will be ran through medication assistance program as no insurance and they will contact us with pharmacy dispensing information and scheduling delivery. Called patient and updated her on process. Told patient we will reach out to her when we know more about when medication will arrive. Patient verbalized understanding and had no questions

## 2015-03-07 ENCOUNTER — Telehealth: Payer: Self-pay | Admitting: General Practice

## 2015-03-07 ENCOUNTER — Telehealth: Payer: Self-pay | Admitting: *Deleted

## 2015-03-07 ENCOUNTER — Inpatient Hospital Stay (HOSPITAL_COMMUNITY)
Admission: AD | Admit: 2015-03-07 | Discharge: 2015-03-07 | Disposition: A | Discharge status: Home / Self Care | Payer: Medicaid Other | Source: Ambulatory Visit | Attending: Family Medicine | Admitting: Family Medicine

## 2015-03-07 ENCOUNTER — Encounter (HOSPITAL_COMMUNITY): Payer: Self-pay | Admitting: *Deleted

## 2015-03-07 DIAGNOSIS — N949 Unspecified condition associated with female genital organs and menstrual cycle: Secondary | ICD-10-CM

## 2015-03-07 DIAGNOSIS — R1030 Lower abdominal pain, unspecified: Secondary | ICD-10-CM | POA: Insufficient documentation

## 2015-03-07 DIAGNOSIS — Z87891 Personal history of nicotine dependence: Secondary | ICD-10-CM | POA: Diagnosis not present

## 2015-03-07 DIAGNOSIS — Z3A17 17 weeks gestation of pregnancy: Secondary | ICD-10-CM | POA: Diagnosis not present

## 2015-03-07 DIAGNOSIS — R102 Pelvic and perineal pain: Secondary | ICD-10-CM | POA: Insufficient documentation

## 2015-03-07 DIAGNOSIS — O9989 Other specified diseases and conditions complicating pregnancy, childbirth and the puerperium: Secondary | ICD-10-CM | POA: Diagnosis not present

## 2015-03-07 LAB — URINALYSIS, ROUTINE W REFLEX MICROSCOPIC
Bilirubin Urine: NEGATIVE
Glucose, UA: NEGATIVE mg/dL
HGB URINE DIPSTICK: NEGATIVE
Ketones, ur: 15 mg/dL — AB
Leukocytes, UA: NEGATIVE
NITRITE: NEGATIVE
Protein, ur: NEGATIVE mg/dL
SPECIFIC GRAVITY, URINE: 1.02 (ref 1.005–1.030)
UROBILINOGEN UA: 1 mg/dL (ref 0.0–1.0)
pH: 7 (ref 5.0–8.0)

## 2015-03-07 NOTE — MAU Note (Signed)
Pt states earlier she was having a tightening in her abd that lasted about one hour. States she has been having lower abd pain off/on all day. Denies bleeding or ROM

## 2015-03-07 NOTE — Telephone Encounter (Signed)
Received call from [facility] representative. He stated that pt's 17P medication is scheduled for delivery on 03/09/15. I confirmed the information and called pt. I left message stating that she will be able to receive her injection as scheduled on 03/13/15. She will need to remain at clinic for 15 minutes following this first injection. She may call back if she has questions.

## 2015-03-07 NOTE — MAU Provider Note (Signed)
History     CSN: [id number]  Arrival date and time: 03/07/15 2152   First Provider Initiated Contact with Patient 03/07/15 2309      No chief complaint on file.  HPI  [name] is a 24 y.o. G2P0100 at [redacted]w[redacted]d who presents to MAU today with complaint of lower abdominal pain today. She states that earlier she had tightening in the lower abdomen x 1 hour. She states that it resolved with rest, but she has continued to have sharp pain off and on in the bilateral lower abdomen. She also endorses sharp vaginal pains occasionally. The patient has a history of PPROM at [redacted] weeks gestation with a previous pregnancy and admits that this feels like just before her water broke. She also endorses loose stools earlier today and one episode of N/V prior to arrival. She denies vaginal bleeding, abnormal discharge, UTI symptoms or fever. She states that she is currently being treated for a yeast infection with Diflucan.   OB History    Gravida Para Term Preterm AB TAB SAB Ectopic Multiple Living   2 1 0 1 0 0 0 0 0 0       Past Medical History  Diagnosis Date  . Allergy   . Asthma     albuterol last used a few months ago  . Preterm labor     Past Surgical History  Procedure Laterality Date  . Wisdom tooth extraction      Family History  Problem Relation Age of Onset  . Diabetes Mother   . Stroke Mother   . Hypertension Father     History  Substance Use Topics  . Smoking status: Former Smoker -- 0.50 packs/day for 10 years    Types: Cigarettes  . Smokeless tobacco: Never Used  . Alcohol Use: No    Allergies:  Allergies  Allergen Reactions  . Shellfish Allergy Hives  . Sulfa Antibiotics Other (See Comments)    Reaction:  Unknown; childhood reaction  . Icy Hot Rash    No prescriptions prior to admission    Review of Systems  Constitutional: Negative for fever and malaise/fatigue.  Gastrointestinal: Positive for nausea, vomiting and abdominal pain. Negative for diarrhea  and constipation.  Genitourinary: Negative for dysuria, urgency and frequency.       Neg - vaginal bleeding, discharge   Physical Exam   Blood pressure 147/78, pulse 94, temperature 98.3 F (36.8 C), temperature source Oral, resp. rate 16, height 5\' 3"  (1.6 m), weight 215 lb (97.523 kg), last menstrual period 11/09/2014, SpO2 100 %.  Physical Exam  Constitutional: She is oriented to person, place, and time. She appears well-developed and well-nourished. No distress.  HENT:  Head: Normocephalic.  Cardiovascular: Normal rate.   Respiratory: Effort normal.  GI: Soft. Bowel sounds are normal. She exhibits no distension and no mass. There is no tenderness. There is no rebound and no guarding.  Genitourinary: Uterus is enlarged (appropriate for GA). Uterus is not tender. Cervix exhibits no motion tenderness, no discharge and no friability. No bleeding in the vagina. Vaginal discharge (small amount of mucous discharge noted. negative pooling. ) found.  Cervix: closed, thick  Neurological: She is alert and oriented to person, place, and time.  Skin: Skin is warm and dry. No erythema.  Psychiatric: She has a normal mood and affect.   Results for orders placed or performed during the hospital encounter of 03/07/15 (from the past 24 hour(s))  Urinalysis, Routine w reflex microscopic     Status:  Abnormal   Collection Time: 03/07/15 10:29 PM  Result Value Ref Range   Color, Urine YELLOW YELLOW   APPearance CLOUDY (A) CLEAR   Specific Gravity, Urine 1.020 1.005 - 1.030   pH 7.0 5.0 - 8.0   Glucose, UA NEGATIVE NEGATIVE mg/dL   Hgb urine dipstick NEGATIVE NEGATIVE   Bilirubin Urine NEGATIVE NEGATIVE   Ketones, ur 15 (A) NEGATIVE mg/dL   Protein, ur NEGATIVE NEGATIVE mg/dL   Urobilinogen, UA 1.0 0.0 - 1.0 mg/dL   Nitrite NEGATIVE NEGATIVE   Leukocytes, UA NEGATIVE NEGATIVE     MAU Course  Procedures None  MDM +FHR - 148 bpm with doppler  Assessment and Plan  A: SIUP at [redacted]w[redacted]d Round  ligament pain  P: Discharge home Patient advised to follow-up with [facility] as scheduled for routine prenatal care or sooner if symptoms worsen Discussed use of abdominal binder, warm bath/shower and Tylenol PRN for pain Advised patient to moderate activity and increase PO hydration as tolerated Patient may return to MAU as needed or if her condition were to change or worsen   Luvenia Redden, PA-C  03/08/2015, 3:54 AM

## 2015-03-07 NOTE — Telephone Encounter (Signed)
Patient called stating she is returning our call, she was unable to hear our message but thinks we were calling about her makena injection. Per chart review, patient's Darol Destine will ship Thursday and patient can receive injection on Monday as currently scheduled. Called patient to inform her that her Darol Destine will ship out on Thursday and she will be able to receive it on Monday. Patient verbalized understanding and had no other questions

## 2015-03-07 NOTE — Discharge Instructions (Signed)
Abdominal Pain During Pregnancy °Belly (abdominal) pain is common during pregnancy. Most of the time, it is not a serious problem. Other times, it can be a sign that something is wrong with the pregnancy. Always tell your doctor if you have belly pain. °HOME CARE °Monitor your belly pain for any changes. The following actions may help you feel better: °· Do not have sex (intercourse) or put anything in your vagina until you feel better. °· Rest until your pain stops. °· Drink clear fluids if you feel sick to your stomach (nauseous). Do not eat solid food until you feel better. °· Only take medicine as told by your doctor. °· Keep all doctor visits as told. °GET HELP RIGHT AWAY IF:  °· You are bleeding, leaking fluid, or pieces of tissue come out of your vagina. °· You have more pain or cramping. °· You keep throwing up (vomiting). °· You have pain when you pee (urinate) or have blood in your pee. °· You have a fever. °· You do not feel your baby moving as much. °· You feel very weak or feel like passing out. °· You have trouble breathing, with or without belly pain. °· You have a very bad headache and belly pain. °· You have fluid leaking from your vagina and belly pain. °· You keep having watery poop (diarrhea). °· Your belly pain does not go away after resting, or the pain gets worse. °MAKE SURE YOU:  °· Understand these instructions. °· Will watch your condition. °· Will get help right away if you are not doing well or get worse. °Document Released: 10/30/2009 Document Revised: 07/14/2013 Document Reviewed: 06/10/2013 °[employer]® Patient Information ©2015 [employer]. This information is not intended to replace advice given to you by your health care provider. Make sure you discuss any questions you have with your health care provider. ° °

## 2015-03-07 NOTE — MAU Note (Signed)
Pt states she has been having pain since 1200. Pt states pain comes and goes. Pt states she had pain constantly for about 1 hour.pt states it's the same feeling she had before her water broke last time.

## 2015-03-13 ENCOUNTER — Ambulatory Visit (INDEPENDENT_AMBULATORY_CARE_PROVIDER_SITE_OTHER): Payer: Medicaid Other | Admitting: *Deleted

## 2015-03-13 DIAGNOSIS — O09212 Supervision of pregnancy with history of pre-term labor, second trimester: Secondary | ICD-10-CM

## 2015-03-13 DIAGNOSIS — O09892 Supervision of other high risk pregnancies, second trimester: Secondary | ICD-10-CM

## 2015-03-13 MED ORDER — HYDROXYPROGESTERONE CAPROATE 250 MG/ML IM OIL
250.0000 mg | TOPICAL_OIL | Freq: Once | INTRAMUSCULAR | Status: AC
  Administered 2015-03-13: 250 mg via INTRAMUSCULAR

## 2015-03-16 ENCOUNTER — Telehealth: Payer: Self-pay

## 2015-03-16 NOTE — Telephone Encounter (Signed)
Patient called stating she thinks she has a UTI and wants to come in and drop off urine. Called patient who states she just has the "urge to pee all the time" and pressure. Informed patient urinalysis from last week was negative for infection. Patient's next appointment is on Monday. Advised patient we wait until Monday appointment,during which time we will run a UA. Patient verbalized understanding and gratitude. No further questions or concerns.

## 2015-03-18 ENCOUNTER — Encounter (HOSPITAL_COMMUNITY): Payer: Self-pay | Admitting: *Deleted

## 2015-03-18 ENCOUNTER — Inpatient Hospital Stay (HOSPITAL_COMMUNITY)
Admission: AD | Admit: 2015-03-18 | Discharge: 2015-03-18 | Disposition: A | Discharge status: Home / Self Care | Payer: Medicaid Other | Source: Ambulatory Visit | Attending: Obstetrics and Gynecology | Admitting: Obstetrics and Gynecology

## 2015-03-18 DIAGNOSIS — M549 Dorsalgia, unspecified: Secondary | ICD-10-CM | POA: Diagnosis present

## 2015-03-18 DIAGNOSIS — O26892 Other specified pregnancy related conditions, second trimester: Secondary | ICD-10-CM | POA: Diagnosis not present

## 2015-03-18 DIAGNOSIS — N898 Other specified noninflammatory disorders of vagina: Secondary | ICD-10-CM | POA: Diagnosis not present

## 2015-03-18 DIAGNOSIS — R109 Unspecified abdominal pain: Secondary | ICD-10-CM | POA: Insufficient documentation

## 2015-03-18 DIAGNOSIS — O9989 Other specified diseases and conditions complicating pregnancy, childbirth and the puerperium: Secondary | ICD-10-CM | POA: Diagnosis not present

## 2015-03-18 DIAGNOSIS — Z3A18 18 weeks gestation of pregnancy: Secondary | ICD-10-CM | POA: Insufficient documentation

## 2015-03-18 DIAGNOSIS — Z87891 Personal history of nicotine dependence: Secondary | ICD-10-CM | POA: Diagnosis not present

## 2015-03-18 LAB — URINALYSIS, ROUTINE W REFLEX MICROSCOPIC
Bilirubin Urine: NEGATIVE
Glucose, UA: NEGATIVE mg/dL
Hgb urine dipstick: NEGATIVE
Ketones, ur: NEGATIVE mg/dL
NITRITE: NEGATIVE
PROTEIN: NEGATIVE mg/dL
Specific Gravity, Urine: 1.025 (ref 1.005–1.030)
UROBILINOGEN UA: 0.2 mg/dL (ref 0.0–1.0)
pH: 5.5 (ref 5.0–8.0)

## 2015-03-18 LAB — WET PREP, GENITAL
CLUE CELLS WET PREP: NONE SEEN
TRICH WET PREP: NONE SEEN
Yeast Wet Prep HPF POC: NONE SEEN

## 2015-03-18 LAB — URINE MICROSCOPIC-ADD ON

## 2015-03-18 NOTE — Discharge Instructions (Signed)
Second Trimester of Pregnancy The second trimester is from week 13 through week 28, months 4 through 6. The second trimester is often a time when you feel your best. Your body has also adjusted to being pregnant, and you begin to feel better physically. Usually, morning sickness has lessened or quit completely, you may have more energy, and you may have an increase in appetite. The second trimester is also a time when the fetus is growing rapidly. At the end of the sixth month, the fetus is about 9 inches long and weighs about 1 pounds. You will likely begin to feel the baby move (quickening) between 18 and 20 weeks of the pregnancy. BODY CHANGES Your body goes through many changes during pregnancy. The changes vary from woman to woman.   Your weight will continue to increase. You will notice your lower abdomen bulging out.  You may begin to get stretch marks on your hips, abdomen, and breasts.  You may develop headaches that can be relieved by medicines approved by your health care provider.  You may urinate more often because the fetus is pressing on your bladder.  You may develop or continue to have heartburn as a result of your pregnancy.  You may develop constipation because certain hormones are causing the muscles that push waste through your intestines to slow down.  You may develop hemorrhoids or swollen, bulging veins (varicose veins).  You may have back pain because of the weight gain and pregnancy hormones relaxing your joints between the bones in your pelvis and as a result of a shift in weight and the muscles that support your balance.  Your breasts will continue to grow and be tender.  Your gums may bleed and may be sensitive to brushing and flossing.  Dark spots or blotches (chloasma, mask of pregnancy) may develop on your face. This will likely fade after the baby is born.  A dark line from your belly button to the pubic area (linea nigra) may appear. This will likely fade  after the baby is born.  You may have changes in your hair. These can include thickening of your hair, rapid growth, and changes in texture. Some women also have hair loss during or after pregnancy, or hair that feels dry or thin. Your hair will most likely return to normal after your baby is born. WHAT TO EXPECT AT YOUR PRENATAL VISITS During a routine prenatal visit:  You will be weighed to make sure you and the fetus are growing normally.  Your blood pressure will be taken.  Your abdomen will be measured to track your baby's growth.  The fetal heartbeat will be listened to.  Any test results from the previous visit will be discussed. Your health care provider may ask you:  How you are feeling.  If you are feeling the baby move.  If you have had any abnormal symptoms, such as leaking fluid, bleeding, severe headaches, or abdominal cramping.  If you have any questions. Other tests that may be performed during your second trimester include:  Blood tests that check for:  Low iron levels (anemia).  Gestational diabetes (between 24 and 28 weeks).  Rh antibodies.  Urine tests to check for infections, diabetes, or protein in the urine.  An ultrasound to confirm the proper growth and development of the baby.  An amniocentesis to check for possible genetic problems.  Fetal screens for spina bifida and Down syndrome. HOME CARE INSTRUCTIONS   Avoid all smoking, herbs, alcohol, and unprescribed   drugs. These chemicals affect the formation and growth of the baby.  Follow your health care provider's instructions regarding medicine use. There are medicines that are either safe or unsafe to take during pregnancy.  Exercise only as directed by your health care provider. Experiencing uterine cramps is a good sign to stop exercising.  Continue to eat regular, healthy meals.  Wear a good support bra for breast tenderness.  Do not use hot tubs, steam rooms, or saunas.  Wear your  seat belt at all times when driving.  Avoid raw meat, uncooked cheese, cat litter boxes, and soil used by cats. These carry germs that can cause birth defects in the baby.  Take your prenatal vitamins.  Try taking a stool softener (if your health care provider approves) if you develop constipation. Eat more high-fiber foods, such as fresh vegetables or fruit and whole grains. Drink plenty of fluids to keep your urine clear or pale yellow.  Take warm sitz baths to soothe any pain or discomfort caused by hemorrhoids. Use hemorrhoid cream if your health care provider approves.  If you develop varicose veins, wear support hose. Elevate your feet for 15 minutes, 3-4 times a day. Limit salt in your diet.  Avoid heavy lifting, wear low heel shoes, and practice good posture.  Rest with your legs elevated if you have leg cramps or low back pain.  Visit your dentist if you have not gone yet during your pregnancy. Use a soft toothbrush to brush your teeth and be gentle when you floss.  A sexual relationship may be continued unless your health care provider directs you otherwise.  Continue to go to all your prenatal visits as directed by your health care provider. SEEK MEDICAL CARE IF:   You have dizziness.  You have mild pelvic cramps, pelvic pressure, or nagging pain in the abdominal area.  You have persistent nausea, vomiting, or diarrhea.  You have a bad smelling vaginal discharge.  You have pain with urination. SEEK IMMEDIATE MEDICAL CARE IF:   You have a fever.  You are leaking fluid from your vagina.  You have spotting or bleeding from your vagina.  You have severe abdominal cramping or pain.  You have rapid weight gain or loss.  You have shortness of breath with chest pain.  You notice sudden or extreme swelling of your face, hands, ankles, feet, or legs.  You have not felt your baby move in over an hour.  You have severe headaches that do not go away with  medicine.  You have vision changes. Document Released: 11/05/2001 Document Revised: 11/16/2013 Document Reviewed: 01/12/2013 ExitCare Patient Information 2015 [employer]. This information is not intended to replace advice given to you by your health care provider. Make sure you discuss any questions you have with your health care provider.  

## 2015-03-18 NOTE — MAU Note (Signed)
Pt presents to MAU with complaints of noticing some leakage of fluid this morning when she woke up. She states it may be sweat but is concerned and wanted to make sure because she had a previous loss in July of 2015 at 21 weeks for SROM. Reports some constant back pain.

## 2015-03-18 NOTE — MAU Provider Note (Signed)
History     CSN: [id number]  Arrival date and time: 03/18/15 1152   First Provider Initiated Contact with Patient 03/18/15 1308      Chief Complaint  Patient presents with  . leakage of fluid    HPI Awoke this afternoon with wetness between her legs, back pain and abdominal pain that feels like she's about to start her period - despite the fact that she is [redacted] weeks pregnant.  She is not sure if the wetness was sweat or not but notes she was very hot.   She also feels her yeast infection from 2 weeks ago is not completely resolved.  She took diflucan and has continued discharge and itching.  She has not noticed any bleeding and no further wetness noted once she cleaned herself up.  She has not noted the baby to move much today.  She denies dysuria, nausea, vomiting, diarrhea.  OB History    Gravida Para Term Preterm AB TAB SAB Ectopic Multiple Living   2 1 0 1 0 0 0 0 0 0       Past Medical History  Diagnosis Date  . Allergy   . Asthma     albuterol last used a few months ago  . Preterm labor     Past Surgical History  Procedure Laterality Date  . Wisdom tooth extraction      Family History  Problem Relation Age of Onset  . Diabetes Mother   . Stroke Mother   . Hypertension Father     History  Substance Use Topics  . Smoking status: Former Smoker -- 0.50 packs/day for 10 years    Types: Cigarettes  . Smokeless tobacco: Never Used  . Alcohol Use: No    Allergies:  Allergies  Allergen Reactions  . Shellfish Allergy Hives  . Sulfa Antibiotics Other (See Comments)    Reaction:  Unknown; childhood reaction  . Icy Hot Rash    Prescriptions prior to admission  Medication Sig Dispense Refill Last Dose  . Prenatal Vit-Fe Fumarate-FA (PRENATAL MULTIVITAMIN) TABS tablet Take 1 tablet by mouth daily.   03/18/2015 at Unknown time  . fluconazole (DIFLUCAN) 150 MG tablet Take 1 tablet now, 1 in 3 days (Patient not taking: Reported on 03/18/2015) 2 tablet 0 Taking    ROS  Pertinent ROS in HPI.  All other systems are negative.  Physical Exam   Blood pressure 119/67, pulse 81, temperature 97.9 F (36.6 C), resp. rate 18, last menstrual period 11/09/2014.  Physical Exam  Constitutional: She is oriented to person, place, and time. She appears well-developed and well-nourished. No distress.  HENT:  Head: Normocephalic and atraumatic.  Eyes: EOM are normal.  Neck: Normal range of motion. Neck supple.  Cardiovascular: Normal rate and regular rhythm.   Respiratory: Effort normal and breath sounds normal. No respiratory distress.  GI: Soft. Bowel sounds are normal. She exhibits no distension. There is no tenderness.  Musculoskeletal: Normal range of motion.  Neurological: She is alert and oriented to person, place, and time.  Skin: Skin is warm and dry.  Psychiatric: She has a normal mood and affect.   Results for orders placed or performed during the hospital encounter of 03/18/15 (from the past 24 hour(s))  Urinalysis, Routine w reflex microscopic     Status: Abnormal   Collection Time: 03/18/15 11:58 AM  Result Value Ref Range   Color, Urine YELLOW YELLOW   APPearance CLEAR CLEAR   Specific Gravity, Urine 1.025 1.005 - 1.030  pH 5.5 5.0 - 8.0   Glucose, UA NEGATIVE NEGATIVE mg/dL   Hgb urine dipstick NEGATIVE NEGATIVE   Bilirubin Urine NEGATIVE NEGATIVE   Ketones, ur NEGATIVE NEGATIVE mg/dL   Protein, ur NEGATIVE NEGATIVE mg/dL   Urobilinogen, UA 0.2 0.0 - 1.0 mg/dL   Nitrite NEGATIVE NEGATIVE   Leukocytes, UA TRACE (A) NEGATIVE  Urine microscopic-add on     Status: Abnormal   Collection Time: 03/18/15 11:58 AM  Result Value Ref Range   Squamous Epithelial / LPF FEW (A) RARE   WBC, UA 0-2 <3 WBC/hpf   RBC / HPF 0-2 <3 RBC/hpf   Bacteria, UA FEW (A) RARE   Urine-Other MUCOUS PRESENT   Wet prep, genital     Status: Abnormal   Collection Time: 03/18/15  1:37 PM  Result Value Ref Range   Yeast Wet Prep HPF POC NONE SEEN NONE SEEN   Trich, Wet  Prep NONE SEEN NONE SEEN   Clue Cells Wet Prep HPF POC NONE SEEN NONE SEEN   WBC, Wet Prep HPF POC FEW (A) NONE SEEN    MAU Course  Procedures  MDM Wet prep,urine negative.  No noted leaking of fluid on pelvic.  No sign of infection or LOF Fetal heart tones stable on triage  Assessment and Plan  A:  1. Vaginal discharge during pregnancy, second trimester    P: Discharge to home Keep prenatal clinic appts Patient may return to MAU as needed or if her condition were to change or worsen   Paticia Stack 03/18/2015, 1:09 PM

## 2015-03-20 ENCOUNTER — Encounter: Payer: Self-pay | Admitting: Family Medicine

## 2015-03-20 ENCOUNTER — Other Ambulatory Visit (HOSPITAL_COMMUNITY): Payer: Self-pay | Admitting: Maternal and Fetal Medicine

## 2015-03-20 ENCOUNTER — Ambulatory Visit (HOSPITAL_COMMUNITY)
Admission: RE | Admit: 2015-03-20 | Discharge: 2015-03-20 | Disposition: A | Discharge status: Home / Self Care | Payer: Medicaid Other | Source: Ambulatory Visit | Attending: Obstetrics and Gynecology | Admitting: Obstetrics and Gynecology

## 2015-03-20 ENCOUNTER — Encounter (HOSPITAL_COMMUNITY): Payer: Self-pay

## 2015-03-20 ENCOUNTER — Ambulatory Visit (HOSPITAL_COMMUNITY): Payer: Medicaid Other

## 2015-03-20 ENCOUNTER — Ambulatory Visit (INDEPENDENT_AMBULATORY_CARE_PROVIDER_SITE_OTHER): Payer: Medicaid Other | Admitting: Family Medicine

## 2015-03-20 VITALS — BP 115/61 | HR 78 | Temp 98.3°F | Wt 213.4 lb

## 2015-03-20 DIAGNOSIS — Z3A18 18 weeks gestation of pregnancy: Secondary | ICD-10-CM | POA: Diagnosis not present

## 2015-03-20 DIAGNOSIS — O09212 Supervision of pregnancy with history of pre-term labor, second trimester: Secondary | ICD-10-CM

## 2015-03-20 DIAGNOSIS — Z8751 Personal history of pre-term labor: Secondary | ICD-10-CM

## 2015-03-20 DIAGNOSIS — Z3A20 20 weeks gestation of pregnancy: Secondary | ICD-10-CM

## 2015-03-20 DIAGNOSIS — O09292 Supervision of pregnancy with other poor reproductive or obstetric history, second trimester: Secondary | ICD-10-CM

## 2015-03-20 DIAGNOSIS — O0991 Supervision of high risk pregnancy, unspecified, first trimester: Secondary | ICD-10-CM

## 2015-03-20 DIAGNOSIS — O42919 Preterm premature rupture of membranes, unspecified as to length of time between rupture and onset of labor, unspecified trimester: Secondary | ICD-10-CM

## 2015-03-20 DIAGNOSIS — O09299 Supervision of pregnancy with other poor reproductive or obstetric history, unspecified trimester: Secondary | ICD-10-CM | POA: Insufficient documentation

## 2015-03-20 DIAGNOSIS — Z3A22 22 weeks gestation of pregnancy: Secondary | ICD-10-CM

## 2015-03-20 LAB — POCT URINALYSIS DIP (DEVICE)
Bilirubin Urine: NEGATIVE
Glucose, UA: NEGATIVE mg/dL
KETONES UR: NEGATIVE mg/dL
Nitrite: NEGATIVE
PH: 6.5 (ref 5.0–8.0)
Protein, ur: NEGATIVE mg/dL
Specific Gravity, Urine: 1.015 (ref 1.005–1.030)
UROBILINOGEN UA: 1 mg/dL (ref 0.0–1.0)

## 2015-03-20 MED ORDER — HYDROXYPROGESTERONE CAPROATE 250 MG/ML IM OIL
250.0000 mg | TOPICAL_OIL | INTRAMUSCULAR | Status: DC
  Administered 2015-03-20 – 2015-06-19 (×14): 250 mg via INTRAMUSCULAR

## 2015-03-20 NOTE — Progress Notes (Signed)
17-p today 

## 2015-03-20 NOTE — Progress Notes (Signed)
Patient is 24 y.o. G2P0100 [redacted]w[redacted]d.  +FM, denies LOF, VB, contractions, vaginal discharge. - had sono today, cervical debris noted, discussed risk of preterm delivery with patient, advised to continue to abstain from checking own cervix/no intercourse/nothing per vagina/no prolonged standing if possible - few leuks/hgb => urine culture, asymptomatic - 17P today, f/u CL 2 weeks, growth sono already ordered, Quad today - requests work restrictions of <8h due to not getting a break, will give note weight lifting restrictions/prolonged standing/<8h

## 2015-03-20 NOTE — Progress Notes (Signed)
Trace leuks and blood in urine.

## 2015-03-21 ENCOUNTER — Telehealth: Payer: Self-pay

## 2015-03-21 LAB — URINE CULTURE
COLONY COUNT: NO GROWTH
ORGANISM ID, BACTERIA: NO GROWTH

## 2015-03-21 NOTE — Telephone Encounter (Signed)
Patient called stating she had an U/S done yesterday that showed debris by cervix-- was told this could be an infection and therefore wants to know what results from wet prep were last week. Per chart review 03/18/15 results negative for infection. Called patient and informed her of this. Patient verbalized understanding and gratitude. No further questions.

## 2015-03-26 ENCOUNTER — Encounter (HOSPITAL_COMMUNITY): Payer: Self-pay | Admitting: *Deleted

## 2015-03-26 ENCOUNTER — Inpatient Hospital Stay (HOSPITAL_COMMUNITY)
Admission: AD | Admit: 2015-03-26 | Discharge: 2015-03-26 | Disposition: A | Discharge status: Home / Self Care | Payer: Medicaid Other | Source: Ambulatory Visit | Attending: Family Medicine | Admitting: Family Medicine

## 2015-03-26 DIAGNOSIS — B3731 Acute candidiasis of vulva and vagina: Secondary | ICD-10-CM

## 2015-03-26 DIAGNOSIS — B373 Candidiasis of vulva and vagina: Secondary | ICD-10-CM | POA: Diagnosis not present

## 2015-03-26 DIAGNOSIS — Z87891 Personal history of nicotine dependence: Secondary | ICD-10-CM | POA: Diagnosis not present

## 2015-03-26 DIAGNOSIS — Z3A19 19 weeks gestation of pregnancy: Secondary | ICD-10-CM

## 2015-03-26 DIAGNOSIS — O98812 Other maternal infectious and parasitic diseases complicating pregnancy, second trimester: Secondary | ICD-10-CM | POA: Insufficient documentation

## 2015-03-26 DIAGNOSIS — O368120 Decreased fetal movements, second trimester, not applicable or unspecified: Secondary | ICD-10-CM | POA: Insufficient documentation

## 2015-03-26 DIAGNOSIS — N898 Other specified noninflammatory disorders of vagina: Secondary | ICD-10-CM | POA: Diagnosis present

## 2015-03-26 LAB — WET PREP, GENITAL
CLUE CELLS WET PREP: NONE SEEN
TRICH WET PREP: NONE SEEN
Yeast Wet Prep HPF POC: NONE SEEN

## 2015-03-26 LAB — URINALYSIS, ROUTINE W REFLEX MICROSCOPIC
Bilirubin Urine: NEGATIVE
Glucose, UA: NEGATIVE mg/dL
Hgb urine dipstick: NEGATIVE
Ketones, ur: NEGATIVE mg/dL
LEUKOCYTES UA: NEGATIVE
Nitrite: NEGATIVE
PH: 6 (ref 5.0–8.0)
Protein, ur: NEGATIVE mg/dL
Urobilinogen, UA: 0.2 mg/dL (ref 0.0–1.0)

## 2015-03-26 MED ORDER — FLUCONAZOLE 150 MG PO TABS: ORAL_TABLET | ORAL | Status: DC

## 2015-03-26 NOTE — MAU Note (Signed)
Pt reports she was here about a week ago thought she a yeast infection it was negative. Stated she is still having a discharge that is thick and green now. Also reprots she has not felt baby move in 2 days. Denies pain or discomfort at this time.

## 2015-03-26 NOTE — MAU Provider Note (Signed)
History     CSN: [id number]  Arrival date and time: 03/26/15 0225   First Provider Initiated Contact with Patient 03/26/15 0249      Chief Complaint  Patient presents with  . Vaginal Discharge   HPI [name] is a 24 y.o. G2P0100 at 104w4d who presents to MAU today with complaint of vaginal discharge and decreased fetal movement. The patient states vaginal discharge x 1 weeks. Was seen previously in MAU with concern for yeast infection, but told tests were negative. She states mucus discharge, with clumpy green noted. She denies abdominal pain, vaginal bleeding, UTI symptoms or recent intercourse. She states decreased fetal movement x 2 days.   OB History    Gravida Para Term Preterm AB TAB SAB Ectopic Multiple Living   2 1 0 1 0 0 0 0 0 0       Past Medical History  Diagnosis Date  . Allergy   . Asthma     albuterol last used a few months ago  . Preterm labor     Past Surgical History  Procedure Laterality Date  . Wisdom tooth extraction      Family History  Problem Relation Age of Onset  . Diabetes Mother   . Stroke Mother   . Hypertension Father     History  Substance Use Topics  . Smoking status: Former Smoker -- 0.50 packs/day for 10 years    Types: Cigarettes  . Smokeless tobacco: Never Used  . Alcohol Use: No    Allergies:  Allergies  Allergen Reactions  . Shellfish Allergy Hives  . Sulfa Antibiotics Other (See Comments)    Reaction:  Unknown; childhood reaction  . Icy Hot Rash    Facility-administered medications prior to admission  Medication Dose Route Frequency Provider Last Rate Last Dose  . hydroxyprogesterone caproate (DELALUTIN) 250 mg/mL injection 250 mg  250 mg Intramuscular Weekly Nila Nephew, MD   250 mg at 03/20/15 1122   Prescriptions prior to admission  Medication Sig Dispense Refill Last Dose  . Prenatal Vit-Fe Fumarate-FA (PRENATAL MULTIVITAMIN) TABS tablet Take 1 tablet by mouth daily.   Taking    Review of Systems   Constitutional: Negative for fever and malaise/fatigue.  Gastrointestinal: Negative for nausea, vomiting, abdominal pain, diarrhea and constipation.  Genitourinary: Negative for dysuria, urgency and frequency.       + vaginal discharge Neg - vaginal bleeding   Physical Exam   Blood pressure 127/67, pulse 76, temperature 98.2 F (36.8 C), temperature source Oral, resp. rate 18, last menstrual period 11/09/2014.  Physical Exam  Nursing note and vitals reviewed. Constitutional: She is oriented to person, place, and time. She appears well-developed and well-nourished. No distress.  HENT:  Head: Normocephalic and atraumatic.  Cardiovascular: Normal rate.   Respiratory: Effort normal.  GI: Soft. She exhibits no distension and no mass. There is no tenderness. There is no rebound and no guarding.  Genitourinary: Uterus is enlarged. Uterus is not tender. Cervix exhibits no motion tenderness, no discharge and no friability. No bleeding in the vagina. Vaginal discharge (small amount of thick, clumpy, yellow discharge noted with small amount of mucus) found.  Neurological: She is alert and oriented to person, place, and time.  Skin: Skin is warm and dry. No erythema.  Psychiatric: She has a normal mood and affect.  Cervix: closed, thick    MAU Course  Procedures None  MDM FHR - 141 bpm with doppler Wet prep today Lab has downtime. Wet prep results sent  to MAU manually.  Yeast - none Trich - none Clue cells - none WBCs - few Moderate bacteria seen   Assessment and Plan  A: SIUP at [redacted]w[redacted]d Yeast vulvovaginitis, clinical  P: Discharge home Rx for Diflucan given to patient Second trimester warnings signs discussed Patient advised to follow-up with [facility] as scheduled for routine prenatal care Patient may return to MAU as needed or if her condition were to change or worsen   Luvenia Redden, PA-C  03/26/2015, 3:24 AM

## 2015-03-26 NOTE — Progress Notes (Signed)
Kerry Hough PA in to discuss test results and d/c plan. Written and verbal d/c instructions given and understanding voiced

## 2015-03-26 NOTE — Discharge Instructions (Signed)

## 2015-03-27 ENCOUNTER — Ambulatory Visit (INDEPENDENT_AMBULATORY_CARE_PROVIDER_SITE_OTHER): Payer: Medicaid Other | Admitting: *Deleted

## 2015-03-27 DIAGNOSIS — O09892 Supervision of other high risk pregnancies, second trimester: Secondary | ICD-10-CM

## 2015-03-27 DIAGNOSIS — O09212 Supervision of pregnancy with history of pre-term labor, second trimester: Secondary | ICD-10-CM

## 2015-03-29 ENCOUNTER — Telehealth: Payer: Self-pay | Admitting: *Deleted

## 2015-03-29 NOTE — Telephone Encounter (Signed)
Called patient and discussed negative results from wet prep and urine culture in MAU. Patient states she was treated with Diflucan 2X already and not much has changed. Informed patient that it is reassuring that tests are negative. Stated the other other tests we might perform is GC/chlamydia-- patient adamant she does not have STD. Informed patient it is normal to have discharge in pregnancy so long as it is not an infection-- with which all tests negative so far. Advised we take another look on Monday at her next visit should it not improve. Patient verbalized understanding and gratitude. No further questions or concerns.

## 2015-03-29 NOTE — Telephone Encounter (Signed)
Pt left message stating that she ahs been having green discharge for a couple weeks. She has been to MAU for evaluation and was treated for yeast infection even though she did not have one. She states concern about whether there is some other problem because she feels this is not normal. She also wants to know if there was anything abnormal with her urine test while @ MAU.

## 2015-03-30 ENCOUNTER — Encounter (HOSPITAL_COMMUNITY): Payer: Self-pay | Admitting: *Deleted

## 2015-03-30 ENCOUNTER — Inpatient Hospital Stay (HOSPITAL_COMMUNITY)
Admission: AD | Admit: 2015-03-30 | Discharge: 2015-03-30 | Disposition: A | Discharge status: Home / Self Care | Payer: Medicaid Other | Source: Ambulatory Visit | Attending: Obstetrics & Gynecology | Admitting: Obstetrics & Gynecology

## 2015-03-30 DIAGNOSIS — R109 Unspecified abdominal pain: Secondary | ICD-10-CM | POA: Diagnosis present

## 2015-03-30 DIAGNOSIS — Z87891 Personal history of nicotine dependence: Secondary | ICD-10-CM | POA: Insufficient documentation

## 2015-03-30 DIAGNOSIS — O09292 Supervision of pregnancy with other poor reproductive or obstetric history, second trimester: Secondary | ICD-10-CM | POA: Insufficient documentation

## 2015-03-30 DIAGNOSIS — O212 Late vomiting of pregnancy: Secondary | ICD-10-CM | POA: Diagnosis not present

## 2015-03-30 DIAGNOSIS — Z3A20 20 weeks gestation of pregnancy: Secondary | ICD-10-CM | POA: Insufficient documentation

## 2015-03-30 DIAGNOSIS — O219 Vomiting of pregnancy, unspecified: Secondary | ICD-10-CM

## 2015-03-30 LAB — URINALYSIS, ROUTINE W REFLEX MICROSCOPIC
Bilirubin Urine: NEGATIVE
Glucose, UA: NEGATIVE mg/dL
Hgb urine dipstick: NEGATIVE
Ketones, ur: NEGATIVE mg/dL
Nitrite: NEGATIVE
Protein, ur: NEGATIVE mg/dL
Specific Gravity, Urine: 1.02 (ref 1.005–1.030)
Urobilinogen, UA: 0.2 mg/dL (ref 0.0–1.0)
pH: 6.5 (ref 5.0–8.0)

## 2015-03-30 LAB — URINE MICROSCOPIC-ADD ON

## 2015-03-30 MED ORDER — PROMETHAZINE HCL 12.5 MG PO TABS: 12.5000 mg | ORAL_TABLET | Freq: Four times a day (QID) | ORAL | Status: DC | PRN

## 2015-03-30 NOTE — MAU Provider Note (Signed)
Chief Complaint: No chief complaint on file.   First Provider Initiated Contact with Patient 03/30/15 1325     SUBJECTIVE HPI: Kim Johns is a 24 y.o. G2P0100 at [redacted]w[redacted]d by LMP who presents with emesis x 2 and abdominal cramping today. Intermittently throughout pregnancy she has felt some lower abdominal cramping that feels like menstrual period about to start. She is also had some bleeding upper abdominal sharp pains lasting a few seconds occurring about 4 times a day. Denies any pain or nausea at present. Has had mucousy white to yellow discharge throughout the pregnancy. She has not eaten or drank any fluids today and usually sleeps till until late in the day. Has decreased appetite. No diarrhea constipation or epigastric pain. Denies heartburn. GC/CT neg 1 month ago, WP neg 03/26/15. Took Diflucan a few days ago for presumptive yeast. Not sexually active since. Denies vaginal bleeding, leakage of fluid. Reports good fetal movement.  Pregnancy Course HRC; Weekly 17-P and US's for serial cx length d/t hx midtri PPROM and fetal demise  Past Medical History  Diagnosis Date  . Allergy   . Asthma     albuterol last used a few months ago  . Preterm labor    OB History  Gravida Para Term Preterm AB SAB TAB Ectopic Multiple Living  2 1 0 1 0 0 0 0 0 0     # Outcome Date GA Lbr Len/2nd Weight Sex Delivery Anes PTL Lv  2 Current           1 Preterm 06/17/14 [redacted]w[redacted]d 03:59 0 kg (0 lb) F  None  FD     Complications: Premature rupture of membranes in second trimester     Past Surgical History  Procedure Laterality Date  . Wisdom tooth extraction     History   Social History  . Marital Status: Single    Spouse Name: N/A  . Number of Children: N/A  . Years of Education: N/A   Occupational History  . Not on file.   Social History Main Topics  . Smoking status: Former Smoker -- 0.50 packs/day for 10 years    Types: Cigarettes  . Smokeless tobacco: Never Used  . Alcohol Use: No  . Drug  Use: No  . Sexual Activity: Not Currently    Birth Control/ Protection: None   Other Topics Concern  . Not on file   Social History Narrative   No current facility-administered medications on file prior to encounter.   Current Outpatient Prescriptions on File Prior to Encounter  Medication Sig Dispense Refill  . Prenatal Vit-Fe Fumarate-FA (PRENATAL MULTIVITAMIN) TABS tablet Take 1 tablet by mouth daily.    . fluconazole (DIFLUCAN) 150 MG tablet Take 1 tab (150 mg) once today and once in 3 days (Patient not taking: Reported on 03/30/2015) 2 tablet 0   Allergies  Allergen Reactions  . Shellfish Allergy Hives  . Sulfa Antibiotics Other (See Comments)    Reaction:  Unknown; childhood reaction  . Icy Hot Rash    Review of Systems  Constitutional: Negative for fever and chills.  Respiratory: Negative for cough.   Gastrointestinal: Positive for nausea, vomiting and abdominal pain. Negative for heartburn, diarrhea, constipation and blood in stool.  Genitourinary: Negative for dysuria, urgency, frequency, hematuria and flank pain.  Musculoskeletal: Negative for back pain.  Neurological: Positive for tingling. Negative for headaches.       Episodes of feet tingling especially when standing at work    OBJECTIVE Blood pressure 133/74, pulse  95, temperature 98.2 F (36.8 C), resp. rate 20, height 5\' 3"  (1.6 m), weight 96.525 kg (212 lb 12.8 oz), last menstrual period 11/09/2014. GENERAL: Well-developed, well-nourished female in no acute distress.  HEART: normal rate RESP: normal effort GI: Abdomen soft, non-tender. Positive bowel sounds 4. DT FHR 154 BACK: neg CVAT MS: Nontender, tr pedal edema NEURO: Alert and oriented  SVE: homogenous white discharge; cx post, sl soft, long/closed/high   LAB RESULTS Results for orders placed or performed during the hospital encounter of 03/30/15 (from the past 24 hour(s))  Urinalysis, Routine w reflex microscopic     Status: Abnormal    Collection Time: 03/30/15 12:30 PM  Result Value Ref Range   Color, Urine YELLOW YELLOW   APPearance CLEAR CLEAR   Specific Gravity, Urine 1.020 1.005 - 1.030   pH 6.5 5.0 - 8.0   Glucose, UA NEGATIVE NEGATIVE mg/dL   Hgb urine dipstick NEGATIVE NEGATIVE   Bilirubin Urine NEGATIVE NEGATIVE   Ketones, ur NEGATIVE NEGATIVE mg/dL   Protein, ur NEGATIVE NEGATIVE mg/dL   Urobilinogen, UA 0.2 0.0 - 1.0 mg/dL   Nitrite NEGATIVE NEGATIVE   Leukocytes, UA SMALL (A) NEGATIVE  Urine microscopic-add on     Status: Abnormal   Collection Time: 03/30/15 12:30 PM  Result Value Ref Range   Squamous Epithelial / LPF FEW (A) RARE   WBC, UA 0-2 <3 WBC/hpf   Bacteria, UA RARE RARE    IMAGING [place] 03/20/15: CL 3 cm  MAU COURSE Declined antiemetic. Retaining Sprite.   ASSESSMENT 1. Vomiting affecting pregnancy   2. Prior poor obstetrical history in second trimester, antepartum   G2P0100 at [redacted]w[redacted]d  PLAN Discharge home with reassurance; advised no meal skipping, increase fluids Report any increased abd pain PTL and FM prcautions    Medication List    STOP taking these medications        fluconazole 150 MG tablet  Commonly known as:  DIFLUCAN      TAKE these medications        prenatal multivitamin Tabs tablet  Take 1 tablet by mouth daily.     promethazine 12.5 MG tablet  Commonly known as:  PHENERGAN  Take 1 tablet (12.5 mg total) by mouth every 6 (six) hours as needed for nausea or vomiting.       Follow-up Information    Follow up with [facility] On 04/03/2015.   Specialty:  Obstetrics and Gynecology   Contact information:   [address] [place] [phone]    04/03/15 for 17-P and MFM [place]   Worth Kober C Sion Thane, CNM 03/30/2015  1:31 PM

## 2015-03-30 NOTE — MAU Note (Signed)
Pt feeling hot at work on Tuesday, vomited at that time. Emesis 2 today. Upper abd cramping, tingling feet. Discharge present, denies bleeding.

## 2015-03-30 NOTE — MAU Note (Signed)
Urine in lab 

## 2015-03-30 NOTE — Discharge Instructions (Signed)

## 2015-03-31 ENCOUNTER — Telehealth: Payer: Self-pay

## 2015-03-31 NOTE — Telephone Encounter (Signed)
Patient called stating she had been getting dental work before she became pregnant and needs a referral letter to continue getting dental work. Called patient who states she needs a Haematologist. Informed patient a dental letter could be written and ready for her to pick up at appointment on Monday. Patient verbalized understanding and gratitude. No further questions or concerns. Dental letter created-- to be printed and given to patient at next appointment.

## 2015-04-03 ENCOUNTER — Encounter (HOSPITAL_COMMUNITY): Payer: Self-pay

## 2015-04-03 ENCOUNTER — Ambulatory Visit (HOSPITAL_COMMUNITY)
Admission: RE | Admit: 2015-04-03 | Discharge: 2015-04-03 | Disposition: A | Discharge status: Home / Self Care | Payer: Medicaid Other | Source: Ambulatory Visit | Attending: Obstetrics and Gynecology | Admitting: Obstetrics and Gynecology

## 2015-04-03 ENCOUNTER — Ambulatory Visit (INDEPENDENT_AMBULATORY_CARE_PROVIDER_SITE_OTHER): Payer: Medicaid Other | Admitting: Family Medicine

## 2015-04-03 ENCOUNTER — Telehealth: Payer: Self-pay | Admitting: General Practice

## 2015-04-03 DIAGNOSIS — O09292 Supervision of pregnancy with other poor reproductive or obstetric history, second trimester: Secondary | ICD-10-CM

## 2015-04-03 DIAGNOSIS — Z3A20 20 weeks gestation of pregnancy: Secondary | ICD-10-CM | POA: Diagnosis not present

## 2015-04-03 DIAGNOSIS — O26879 Cervical shortening, unspecified trimester: Secondary | ICD-10-CM | POA: Insufficient documentation

## 2015-04-03 DIAGNOSIS — O09212 Supervision of pregnancy with history of pre-term labor, second trimester: Secondary | ICD-10-CM | POA: Diagnosis not present

## 2015-04-03 DIAGNOSIS — O26872 Cervical shortening, second trimester: Secondary | ICD-10-CM

## 2015-04-03 DIAGNOSIS — Z8751 Personal history of pre-term labor: Secondary | ICD-10-CM | POA: Insufficient documentation

## 2015-04-03 LAB — POCT URINALYSIS DIP (DEVICE)
Bilirubin Urine: NEGATIVE
Glucose, UA: NEGATIVE mg/dL
HGB URINE DIPSTICK: NEGATIVE
Ketones, ur: NEGATIVE mg/dL
NITRITE: NEGATIVE
Protein, ur: NEGATIVE mg/dL
Specific Gravity, Urine: 1.01 (ref 1.005–1.030)
Urobilinogen, UA: 0.2 mg/dL (ref 0.0–1.0)
pH: 6.5 (ref 5.0–8.0)

## 2015-04-03 MED ORDER — PROGESTERONE MICRONIZED 200 MG PO CAPS: 200.0000 mg | ORAL_CAPSULE | Freq: Every day | ORAL | Status: DC

## 2015-04-03 NOTE — Progress Notes (Signed)
Called [facility] and requested refill of makena.

## 2015-04-03 NOTE — Patient Instructions (Signed)
Cervical Cerclage Cervical cerclage is a surgical procedure for an incompetent cervix. An incompetent cervix is a weak cervix that opens up before labor begins. Cervical cerclage is a procedure in which the cervix is sewn closed during pregnancy.  LET [facility] CARE PROVIDER KNOW ABOUT:   Any allergies you have.  All medicines you are taking, including vitamins, herbs, eye drops, creams, and over-the-counter medicines.  Previous problems you or members of your family have had with the use of anesthetics.  Any blood disorders you have.  Previous surgeries you have had.  Medical conditions you have.  Any recent colds or infections. RISKS AND COMPLICATIONS  Generally, this is a safe procedure. However, as with any procedure, problems can occur. Possible problems include:  Infection.  Bleeding.  Rupturing the amniotic sac (membranes).  Going into early labor and delivery.  Problems with the anesthetics.  Infection of the amniotic sac. BEFORE THE PROCEDURE   Ask your health care provider about changing or stopping your medicines.  Do not eat or drink anything for 6-8 hours before the procedure.  Arrange for someone to drive you home after the procedure. PROCEDURE   An IV tube will be placed in your vein. You will be given a sedative to help you relax.  You will be given a medicine that makes you sleep through the procedure (general anesthetic) or a medicine injected into your spine that numbs your body below the waist (spinal or epidural anesthetic). You will be asleep or be numbed through the entire procedure.  A speculum will be placed in your vagina to visualize your cervix.  The cervix is then grasped and stitched closed tightly.  Ultrasound may be used to guide the procedure and monitor the baby. AFTER THE PROCEDURE   You will go to a recovery room where you and your unborn baby are monitored. Once you are awake, stable, and taking fluids well, you will be allowed  to return to your room.  You will usually stay in the hospital overnight.  You may get an injection of progesterone to prevent uterine contractions.  You may be given pain-relieving medicines to take with you when you go home.  Have someone drive you home and stay with you for up to 2 days. Document Released: 10/24/2008 Document Revised: 11/16/2013 Document Reviewed: 06/02/2013 [facility] Patient Information 2015 [place], [place]. This information is not intended to replace advice given to you by your health care provider. Make sure you discuss any questions you have with your health care provider.

## 2015-04-03 NOTE — Progress Notes (Signed)
C/o might be having contractions- has them " all the time".

## 2015-04-03 NOTE — Progress Notes (Signed)
Discussed contractions--if more than 6/hour--come see us--labor precautions given. U/S today showed shortened cervix at 2.0 cm with funneling at internal os--MFM recommends additional vaginal Prometrium and cerclage--booked for Wednesday at 10 am. Continue 17 P.

## 2015-04-03 NOTE — Progress Notes (Signed)
Moderate leuks on Udip

## 2015-04-03 NOTE — Telephone Encounter (Signed)
Patient called and left message stating she was here earlier today and the doctor prescribed her progesterone suppositories but her pharmacy does not have it. Patient states she has been having issues trying to get it switched to another pharmacy and could we help her with that. States [facility] uses [facility] on [address]. Spoke to Dr Deniece Ree who will change Rx. Called patient, no answer- left message stating we are trying to return your phone call, we have fixed the prescription and you may go by and pick it up from your [facility] pharmacy. If you have any trouble picking the medication up this time please call back and let us know.

## 2015-04-04 ENCOUNTER — Encounter (HOSPITAL_COMMUNITY): Payer: Self-pay | Admitting: *Deleted

## 2015-04-04 ENCOUNTER — Other Ambulatory Visit: Payer: Self-pay | Admitting: Family Medicine

## 2015-04-04 ENCOUNTER — Inpatient Hospital Stay (HOSPITAL_COMMUNITY)
Admission: AD | Admit: 2015-04-04 | Discharge: 2015-04-04 | Disposition: A | Discharge status: Home / Self Care | Payer: Medicaid Other | Source: Ambulatory Visit | Attending: Family Medicine | Admitting: Family Medicine

## 2015-04-04 DIAGNOSIS — O9989 Other specified diseases and conditions complicating pregnancy, childbirth and the puerperium: Secondary | ICD-10-CM | POA: Diagnosis not present

## 2015-04-04 DIAGNOSIS — N859 Noninflammatory disorder of uterus, unspecified: Secondary | ICD-10-CM

## 2015-04-04 DIAGNOSIS — Z87891 Personal history of nicotine dependence: Secondary | ICD-10-CM | POA: Diagnosis not present

## 2015-04-04 DIAGNOSIS — Z3A20 20 weeks gestation of pregnancy: Secondary | ICD-10-CM | POA: Diagnosis not present

## 2015-04-04 DIAGNOSIS — N858 Other specified noninflammatory disorders of uterus: Secondary | ICD-10-CM

## 2015-04-04 DIAGNOSIS — R109 Unspecified abdominal pain: Secondary | ICD-10-CM | POA: Diagnosis present

## 2015-04-04 LAB — URINALYSIS, ROUTINE W REFLEX MICROSCOPIC
Bilirubin Urine: NEGATIVE
GLUCOSE, UA: NEGATIVE mg/dL
HGB URINE DIPSTICK: NEGATIVE
Ketones, ur: NEGATIVE mg/dL
Nitrite: NEGATIVE
PH: 6 (ref 5.0–8.0)
Protein, ur: NEGATIVE mg/dL
Specific Gravity, Urine: 1.015 (ref 1.005–1.030)
Urobilinogen, UA: 0.2 mg/dL (ref 0.0–1.0)

## 2015-04-04 LAB — URINE MICROSCOPIC-ADD ON

## 2015-04-04 MED ORDER — CYCLOBENZAPRINE HCL 5 MG PO TABS: 5.0000 mg | ORAL_TABLET | Freq: Three times a day (TID) | ORAL | Status: DC | PRN

## 2015-04-04 MED ORDER — CYCLOBENZAPRINE HCL 5 MG PO TABS
5.0000 mg | ORAL_TABLET | Freq: Once | ORAL | Status: AC
  Administered 2015-04-04: 5 mg via ORAL
  Filled 2015-04-04: qty 1

## 2015-04-04 MED ORDER — PROGESTERONE MICRONIZED 200 MG PO CAPS: 200.0000 mg | ORAL_CAPSULE | Freq: Every day | ORAL | Status: DC

## 2015-04-04 NOTE — Discharge Instructions (Signed)
Preterm Labor Information Preterm labor is when labor starts before you are [redacted] weeks pregnant. The normal length of pregnancy is 39 to 41 weeks.  CAUSES  The cause of preterm labor is not often known. The most common known cause is infection. RISK FACTORS  Having a history of preterm labor.  Having your water break before it should.  Having a placenta that covers the opening of the cervix.  Having a placenta that breaks away from the uterus.  Having a cervix that is too weak to hold the baby in the uterus.  Having too much fluid in the amniotic sac.  Taking drugs or smoking while pregnant.  Not gaining enough weight while pregnant.  Being younger than 24 and older than 25 years old.  Having a low income.  Being African American. SYMPTOMS  Period-like cramps, belly (abdominal) pain, or back pain.  Contractions that are regular, as often as six in an hour. They may be mild or painful.  Contractions that start at the top of the belly. They then move to the lower belly and back.  Lower belly pressure that seems to get stronger.  Bleeding from the vagina.  Fluid leaking from the vagina. TREATMENT  Treatment depends on:  Your condition.  The condition of your baby.  How many weeks pregnant you are. Your doctor may have you:  Take medicine to stop contractions.  Stay in bed except to use the restroom (bed rest).  Stay in the hospital. WHAT SHOULD YOU DO IF YOU THINK YOU ARE IN PRETERM LABOR? Call your doctor right away. You need to go to the hospital right away.  HOW CAN YOU PREVENT PRETERM LABOR IN FUTURE PREGNANCIES?  Stop smoking, if you smoke.  Maintain healthy weight gain.  Do not take drugs or be around chemicals that are not needed.  Tell your doctor if you think you have an infection.  Tell your doctor if you had a preterm labor before. Document Released: 02/07/2009 Document Revised: 09/01/2013 Document Reviewed: 02/07/2009 [facility] Patient  Information 2015 [place], [place]. This information is not intended to replace advice given to you by your health care provider. Make sure you discuss any questions you have with your health care provider.

## 2015-04-04 NOTE — MAU Note (Addendum)
Short cervix (2 cm) with funneling, noted yesterday on [place].  Is scheduled for cerclage tomorrow. Has been feeling tightening,- feeling it only on the right side today. No bleeding or leaking.  Hx of PT birth, loss the baby at [facility]

## 2015-04-04 NOTE — MAU Provider Note (Signed)
History     CSN: [id number]  Arrival date and time: 04/04/15 1831   First Provider Initiated Contact with Patient 04/04/15 1918      Chief Complaint  Patient presents with  . Abdominal Cramping   HPI Patient is 24 y.o. G2P0100 [redacted]w[redacted]d here with complaints of possible contractions that are on R side of abdomen that occurred q10-15 minutes but now is not feeling them.  Contractions have been ongoing for over a week.  She has been checked in MAU previously and sent home with a normal cervical exam.  Seen by [facility] provider recently in clinic, had an ultrasound which showed a shortened cervix and was determined to need cerclage and instructed to come for evaluation if contracting.  Last baby was born at 56 weeks.  +FM, denies LOF, VB, vaginal discharge.    OB History    Gravida Para Term Preterm AB TAB SAB Ectopic Multiple Living   2 1 0 1 0 0 0 0 0 0      Past Medical History  Diagnosis Date  . Allergy   . Asthma     albuterol last used a few months ago  . Preterm labor    Past Surgical History  Procedure Laterality Date  . Wisdom tooth extraction     Family History  Problem Relation Age of Onset  . Diabetes Mother   . Stroke Mother   . Hypertension Father    History  Substance Use Topics  . Smoking status: Former Smoker -- 0.50 packs/day for 10 years    Types: Cigarettes  . Smokeless tobacco: Never Used  . Alcohol Use: No   Allergies:  Allergies  Allergen Reactions  . Shellfish Allergy Hives  . Sulfa Antibiotics Other (See Comments)    Reaction:  Unknown; childhood reaction  . Icy Hot Rash   No prescriptions prior to admission   Review of Systems  Constitutional: Negative for fever and chills.  HENT: Negative for congestion.   Eyes: Negative for blurred vision, double vision and photophobia.  Respiratory: Negative for cough and shortness of breath.   Cardiovascular: Negative for chest pain and leg swelling.  Gastrointestinal: Positive for abdominal pain  (intermittent sensation of contractions on R side). Negative for heartburn, nausea, vomiting and diarrhea.  Genitourinary: Negative for dysuria, urgency, frequency and hematuria.  Musculoskeletal: Positive for back pain (intermittent LBP). Negative for falls.  Skin: Negative for rash.  Neurological: Negative for headaches.   Physical Exam   Blood pressure 131/70, pulse 77, temperature 98.6 F (37 C), temperature source Oral, resp. rate 16, height 5' 3.5" (1.613 m), weight 212 lb (96.163 kg), last menstrual period 11/09/2014.  Physical Exam  Constitutional: She is oriented to person, place, and time. She appears well-developed and well-nourished. No distress.  HENT:  Head: Normocephalic and atraumatic.  Eyes: EOM are normal. No scleral icterus.  Neck: Normal range of motion. Neck supple.  Cardiovascular: Normal rate, regular rhythm, normal heart sounds and intact distal pulses.   Respiratory: Effort normal and breath sounds normal. She has no wheezes.  GI: Soft. Bowel sounds are normal. There is no tenderness. There is no rebound and no guarding.  gravid  Genitourinary: Vagina normal. No vaginal discharge found.  No discharge or bleeding appreciated  Musculoskeletal: Normal range of motion. She exhibits no edema or tenderness.  Neurological: She is alert and oriented to person, place, and time.  Skin: Skin is warm and dry. No rash noted.  Psychiatric: She has a normal mood and affect.  Her behavior is normal. Judgment and thought content normal.  Dilation: Fingertip Effacement (%): Thick Cervical Position: Posterior Exam by:: Dr Deniece Ree Moderate consistency, on exam cervix, gentle exam revealed cervix 0.5-1cm, could not adequately confirm 1cm, therefore 0.5cm documented.  No contractions on toco  MAU Course  Procedures  MDM   Assessment and Plan  Patient is 24 y.o. G2P0100 [redacted]w[redacted]d reporting cramping likely secondary to uterine irritability - fetal kick counts reinforced -  preterm labor precautions - pt monitored x 2 hours with no worsening of symptoms, repeat exam deferred.  Patient has appt tomorrow for cerclage.   Narissa Beaufort ROCIO, DO 04/04/2015, 10:12 PM

## 2015-04-05 ENCOUNTER — Encounter (HOSPITAL_COMMUNITY)
Admission: RE | Disposition: A | Discharge status: Home / Self Care | Payer: Self-pay | Source: Ambulatory Visit | Attending: Obstetrics & Gynecology

## 2015-04-05 ENCOUNTER — Ambulatory Visit (HOSPITAL_COMMUNITY)
Admission: RE | Admit: 2015-04-05 | Discharge: 2015-04-05 | Disposition: A | Discharge status: Home / Self Care | Payer: Medicaid Other | Source: Ambulatory Visit | Attending: Obstetrics & Gynecology | Admitting: Obstetrics & Gynecology

## 2015-04-05 ENCOUNTER — Ambulatory Visit (HOSPITAL_COMMUNITY): Payer: Medicaid Other | Admitting: Anesthesiology

## 2015-04-05 ENCOUNTER — Encounter (HOSPITAL_COMMUNITY): Payer: Self-pay | Admitting: Obstetrics & Gynecology

## 2015-04-05 DIAGNOSIS — Z882 Allergy status to sulfonamides status: Secondary | ICD-10-CM | POA: Diagnosis not present

## 2015-04-05 DIAGNOSIS — J45909 Unspecified asthma, uncomplicated: Secondary | ICD-10-CM

## 2015-04-05 DIAGNOSIS — O26892 Other specified pregnancy related conditions, second trimester: Secondary | ICD-10-CM

## 2015-04-05 DIAGNOSIS — Z3A21 21 weeks gestation of pregnancy: Secondary | ICD-10-CM | POA: Insufficient documentation

## 2015-04-05 DIAGNOSIS — Z91013 Allergy to seafood: Secondary | ICD-10-CM | POA: Insufficient documentation

## 2015-04-05 DIAGNOSIS — Z8751 Personal history of pre-term labor: Secondary | ICD-10-CM

## 2015-04-05 DIAGNOSIS — Z87891 Personal history of nicotine dependence: Secondary | ICD-10-CM | POA: Diagnosis not present

## 2015-04-05 DIAGNOSIS — O3432 Maternal care for cervical incompetence, second trimester: Secondary | ICD-10-CM

## 2015-04-05 HISTORY — PX: CERVICAL CERCLAGE: SHX1329

## 2015-04-05 LAB — CBC
HCT: 37.9 % (ref 36.0–46.0)
Hemoglobin: 13.8 g/dL (ref 12.0–15.0)
MCH: 33.9 pg (ref 26.0–34.0)
MCHC: 36.4 g/dL — ABNORMAL HIGH (ref 30.0–36.0)
MCV: 93.1 fL (ref 78.0–100.0)
Platelets: 217 10*3/uL (ref 150–400)
RBC: 4.07 MIL/uL (ref 3.87–5.11)
RDW: 12.7 % (ref 11.5–15.5)
WBC: 9.5 10*3/uL (ref 4.0–10.5)

## 2015-04-05 SURGERY — CERCLAGE, CERVIX, VAGINAL APPROACH
Anesthesia: Spinal | Site: Vagina

## 2015-04-05 MED ORDER — ACETAMINOPHEN 500 MG PO TABS
ORAL_TABLET | ORAL | Status: AC
  Filled 2015-04-05: qty 2

## 2015-04-05 MED ORDER — BUPIVACAINE HCL (PF) 0.25 % IJ SOLN
INTRAMUSCULAR | Status: DC | PRN
  Administered 2015-04-05: 30 mL

## 2015-04-05 MED ORDER — LACTATED RINGERS IV SOLN
INTRAVENOUS | Status: DC
  Administered 2015-04-05: 13:00:00 via INTRAVENOUS

## 2015-04-05 MED ORDER — METRONIDAZOLE 500 MG PO TABS: 500.0000 mg | ORAL_TABLET | Freq: Two times a day (BID) | ORAL | Status: AC

## 2015-04-05 MED ORDER — DOXYCYCLINE HYCLATE 100 MG PO CAPS
100.0000 mg | ORAL_CAPSULE | Freq: Two times a day (BID) | ORAL | Status: DC
Start: 2015-04-05 — End: 2015-04-15

## 2015-04-05 MED ORDER — LACTATED RINGERS IV SOLN
INTRAVENOUS | Status: DC
  Administered 2015-04-05 (×2): via INTRAVENOUS

## 2015-04-05 MED ORDER — ACETAMINOPHEN 500 MG PO TABS
1000.0000 mg | ORAL_TABLET | Freq: Four times a day (QID) | ORAL | Status: DC | PRN
  Administered 2015-04-05: 1000 mg via ORAL

## 2015-04-05 MED ORDER — FENTANYL CITRATE (PF) 100 MCG/2ML IJ SOLN
INTRAMUSCULAR | Status: DC | PRN
  Administered 2015-04-05 (×4): 25 ug via INTRAVENOUS

## 2015-04-05 MED ORDER — BUPIVACAINE HCL (PF) 0.5 % IJ SOLN
INTRAMUSCULAR | Status: AC
  Filled 2015-04-05: qty 30

## 2015-04-05 MED ORDER — INDOMETHACIN 50 MG RE SUPP
100.0000 mg | Freq: Once | RECTAL | Status: DC
  Filled 2015-04-05: qty 2

## 2015-04-05 MED ORDER — INDOMETHACIN 50 MG RE SUPP
RECTAL | Status: DC | PRN
  Administered 2015-04-05: 100 mg via RECTAL

## 2015-04-05 MED ORDER — FENTANYL CITRATE (PF) 100 MCG/2ML IJ SOLN
INTRAMUSCULAR | Status: AC
  Filled 2015-04-05: qty 2

## 2015-04-05 MED ORDER — BUPIVACAINE IN DEXTROSE 0.75-8.25 % IT SOLN
INTRATHECAL | Status: DC | PRN
  Administered 2015-04-05: 1.6 mL via INTRATHECAL

## 2015-04-05 SURGICAL SUPPLY — 17 items
CLOTH BEACON ORANGE TIMEOUT ST (SAFETY) ×2 IMPLANT
COUNTER NEEDLE 1200 MAGNETIC (NEEDLE) ×2 IMPLANT
GLOVE BIO SURGEON STRL SZ7 (GLOVE) ×2 IMPLANT
GLOVE BIOGEL PI IND STRL 7.0 (GLOVE) ×1 IMPLANT
GLOVE BIOGEL PI INDICATOR 7.0 (GLOVE) ×1
GOWN STRL REUS W/TWL LRG LVL3 (GOWN DISPOSABLE) ×4 IMPLANT
NEEDLE MAYO .5 CIRCLE (NEEDLE) ×2 IMPLANT
PACK VAGINAL MINOR WOMEN LF (CUSTOM PROCEDURE TRAY) ×2 IMPLANT
PAD OB MATERNITY 4.3X12.25 (PERSONAL CARE ITEMS) ×2 IMPLANT
PAD PREP 24X48 CUFFED NSTRL (MISCELLANEOUS) ×2 IMPLANT
SUT ETHIBOND  5 (SUTURE) ×1
SUT ETHIBOND 5 (SUTURE) ×1 IMPLANT
TOWEL OR 17X24 6PK STRL BLUE (TOWEL DISPOSABLE) ×4 IMPLANT
TRAY FOLEY CATH SILVER 14FR (SET/KITS/TRAYS/PACK) ×2 IMPLANT
TUBING NON-CON 1/4 X 20 CONN (TUBING) IMPLANT
WATER STERILE IRR 1000ML POUR (IV SOLUTION) ×2 IMPLANT
YANKAUER SUCT BULB TIP NO VENT (SUCTIONS) IMPLANT

## 2015-04-05 NOTE — Anesthesia Postprocedure Evaluation (Signed)
  Anesthesia Post-op Note  Patient: Kim Johns  Procedure(s) Performed: Procedure(s): CERCLAGE CERVICAL (N/A)  Patient Location: PACU  Anesthesia Type:Spinal  Level of Consciousness: awake  Airway and Oxygen Therapy: Patient Spontanous Breathing  Post-op Pain: none  Post-op Assessment: Post-op Vital signs reviewed, Patient's Cardiovascular Status Stable, Respiratory Function Stable, Patent Airway, No signs of Nausea or vomiting and Pain level controlled  Post-op Vital Signs: Reviewed and stable  Last Vitals:  Filed Vitals:   04/05/15 1859  BP: 122/69  Pulse: 75  Temp: 36.5 C  Resp: 20    Complications: No apparent anesthesia complications

## 2015-04-05 NOTE — Anesthesia Preprocedure Evaluation (Signed)
Anesthesia Evaluation  Patient identified by MRN, date of birth, ID band Patient awake    Reviewed: Allergy & Precautions, NPO status , Patient's Chart, lab work & pertinent test results  History of Anesthesia Complications Negative for: history of anesthetic complications  Airway Mallampati: II  TM Distance: >3 FB Neck ROM: Full    Dental  (+) Teeth Intact   Pulmonary neg shortness of breath, neg sleep apnea, neg COPDneg recent URI, former smoker,  breath sounds clear to auscultation        Cardiovascular negative cardio ROS  Rhythm:Regular     Neuro/Psych negative neurological ROS  negative psych ROS   GI/Hepatic negative GI ROS, Neg liver ROS,   Endo/Other  Morbid obesity  Renal/GU negative Renal ROS     Musculoskeletal   Abdominal   Peds  Hematology   Anesthesia Other Findings   Reproductive/Obstetrics (+) Pregnancy                             Anesthesia Physical Anesthesia Plan  ASA: II  Anesthesia Plan: Spinal   Post-op Pain Management:    Induction:   Airway Management Planned: Natural Airway  Additional Equipment: None  Intra-op Plan:   Post-operative Plan:   Informed Consent: I have reviewed the patients History and Physical, chart, labs and discussed the procedure including the risks, benefits and alternatives for the proposed anesthesia with the patient or authorized representative who has indicated his/her understanding and acceptance.   Dental advisory given  Plan Discussed with: CRNA and Surgeon  Anesthesia Plan Comments:         Anesthesia Quick Evaluation

## 2015-04-05 NOTE — Anesthesia Procedure Notes (Signed)
Spinal Patient location during procedure: OR Staffing Anesthesiologist: Deva Ron, CHRIS Preanesthetic Checklist Completed: patient identified, surgical consent, pre-op evaluation, timeout performed, IV checked, risks and benefits discussed and monitors and equipment checked Spinal Block Patient position: sitting Prep: site prepped and draped and DuraPrep Patient monitoring: heart rate, cardiac monitor, continuous pulse ox and blood pressure Approach: midline Location: L3-4 Injection technique: single-shot Needle Needle type: Pencan  Needle gauge: 24 G Needle length: 10 cm Assessment Sensory level: T8   

## 2015-04-05 NOTE — Discharge Instructions (Signed)
°  Cervical Cerclage, Care After Refer to this sheet in the next few weeks. These instructions provide you with information on caring for yourself after your procedure. Your health care provider may also give you more specific instructions. Your treatment has been planned according to current medical practices, but problems sometimes occur. Call your health care provider if you have any problems or questions after your procedure. WHAT TO EXPECT AFTER THE PROCEDURE  After your procedure, it is typical to have the following:  Abdominal cramping.  Vaginal spotting. HOME CARE INSTRUCTIONS   Only take over-the-counter or prescription medicines for pain, discomfort, or fever as directed by your health care provider.  Avoid physical activities and exercise until your health care provider says it is okay.  Do not douche or have sexual intercourse until your health care provider tells you it is okay.  Keep your follow-up surgical and prenatal appointments with your health care provider. SEEK MEDICAL CARE IF:   You have abnormal vaginal discharge.  You have a rash.  You become lightheaded or feel faint.  You have abdominal pain that is not controlled with pain medicine. SEEK IMMEDIATE MEDICAL CARE IF:   You develop vaginal bleeding.  You are leaking fluid or have a gush of fluid from the vagina.  You have a fever.  You faint.  You have uterine contractions.  You feel your baby is not moving as much as usual, or you cannot feel your baby move.  You have chest pain or shortness of breath. Document Released: 09/01/2013 Document Revised: 11/16/2013 Document Reviewed: 09/01/2013 [facility] Patient Information 2015 [place], [place]. This information is not intended to replace advice given to you by your health care provider. Make sure you discuss any questions you have with your health care provider.

## 2015-04-05 NOTE — Op Note (Signed)
Kim Johns   PROCEDURE DATE: 04/05/2015  PREOPERATIVE DIAGNOSIS: Intrauterine pregnancy at [redacted]w[redacted]d, history of cervical incompetence   POSTOPERATIVE DIAGNOSIS: The same PROCEDURE: Transvaginal Rescue McDonald Cervical Cerclage Placement SURGEON: Tysin Salada L. Harraway-Smith, M.D., FACOG  INDICATIONS: 24 y.o. G2P0100 at [redacted]w[redacted]d with history of cervical incompetence, here for cerclage placement.   The risks of surgery were discussed in detail with the patient including but not limited to: bleeding; infection which may require antibiotic therapy; injury to cervix, vagina other surrounding organs; risk of ruptured membranes and/or preterm delivery and other postoperative or anesthesia complications.  Written informed consent was obtained.    FINDINGS:  About 2 cm palpable cervical length in the vagina, closed cervix, suture knot placed anteriorly.  ANESTHESIA:  Spinal COMPLICATIONS: None immediate  PROCEDURE IN DETAIL:  The patient had sequential compression devices applied to her lower extremities while in the preoperative area.  Reassuring fetal heart rate was also obtained using a doppler. She was then taken to the operating room where spinal anesthesia was administered and was found to be adequate.  She was placed in the dorsal lithotomy, and was prepped and draped in a sterile manner. Her bladder was catheterized for an unmeasured amount of clear, yellow urine. After an adequate timeout was performed, a vaginal speculum was then placed in the patient's vagina and a single tooth tenaculum was applied to the anterior lip of the cervix.    The anterior and posterior lips of the cervix was grasped with ring forceps. A curved needle loaded with a number 5 Ethibond suture was inserted at 12 o'clock, as high as possible at the junction of the rugated vaginal epithelium and the smooth cervix, at least 2 cm above the external os.  Six bites are taken circumferentially around the entire cervix in a purse-string  fashion, each bite should be deep enough to extend at least midway into the cervical stroma, but not into the endocervical canal. The two ends of the suture were then tied securely anteriorly and cut, leaving the ends long enough to grasp with a clamp when it is time to remove it. There was minimal bleeding noted and the ring forceps were removed with good hemostasis noted.  All instruments were removed from the patient's vagina.   Indomethacin 100 mg rectal suppository was placed.  Instrument, needle and sponge counts were correct x 2. The patient tolerated the procedure well, and was taken to the recovery area awake and in stable condition. Reassuring fetal heart rate was also obtained using a doppler in the recovery area.  The patient will be discharged to home as per PACU criteria.  Routine postoperative instructions given.  She was prescribed Percocet and Colace.  She will follow up in the clinic in 2 weeks for postoperative evaluation and ongoing prenatal care.

## 2015-04-05 NOTE — Brief Op Note (Signed)
04/05/2015  2:14 PM  PATIENT:  Kim Johns  24 y.o. female  PRE-OPERATIVE DIAGNOSIS:  cpt [phone] - Incompetent cervix  POST-OPERATIVE DIAGNOSIS:  cpt 03500 - Incompetent cervix  PROCEDURE:  Procedure(s): CERCLAGE CERVICAL (N/A)  SURGEON:  Surgeon(s) and Role:    * Lavonia Drafts, MD - Primary  ANESTHESIA:   spinal  EBL:  Total I/O In: 600 [I.V.:600] Out: 22 [Urine:50; Blood:2]  BLOOD ADMINISTERED:none  DRAINS: none   LOCAL MEDICATIONS USED:  MARCAINE     SPECIMEN:  No Specimen  DISPOSITION OF SPECIMEN:  N/A  COUNTS:  YES  TOURNIQUET:  * No tourniquets in log *  DICTATION: .Note written in EPIC  PLAN OF CARE: Discharge to home after PACU  PATIENT DISPOSITION:  PACU - hemodynamically stable.   Delay start of Pharmacological VTE agent (>24hrs) due to surgical blood loss or risk of bleeding: not applicable  Complications: none immediate

## 2015-04-05 NOTE — H&P (Signed)
HPI Patient is 24 y.o. G32P0100 [redacted]w[redacted]d here with complaints of possible contractions that are on R side of abdomen that occurred q10-15 minutes but now is not feeling them. Contractions have been ongoing for over a week. She has been checked in MAU previously and sent home with a normal cervical exam. Seen by [facility] provider recently in clinic, had an ultrasound which showed a shortened cervix and was determined to need cerclage and instructed to come for evaluation if contracting. Last baby was born at 44 weeks. +FM, denies LOF, VB, vaginal discharge.   Update: she now presents for rescue cerclage.  NO new complaints.   OB History    Gravida Para Term Preterm AB TAB SAB Ectopic Multiple Living   2 1 0 1 0 0 0 0 0 0      Past Medical History  Diagnosis Date  . Allergy   . Asthma     albuterol last used a few months ago  . Preterm labor    Past Surgical History  Procedure Laterality Date  . Wisdom tooth extraction     Family History  Problem Relation Age of Onset  . Diabetes Mother   . Stroke Mother   . Hypertension Father    History  Substance Use Topics  . Smoking status: Former Smoker -- 0.50 packs/day for 10 years    Types: Cigarettes  . Smokeless tobacco: Never Used  . Alcohol Use: No   Allergies:  Allergies  Allergen Reactions  . Shellfish Allergy Hives  . Sulfa Antibiotics Other (See Comments)    Reaction: Unknown; childhood reaction  . Icy Hot Rash   No prescriptions prior to admission   Review of Systems  Constitutional: Negative for fever and chills.  HENT: Negative for congestion.  Eyes: Negative for blurred vision, double vision and photophobia.  Respiratory: Negative for cough and shortness of breath.  Cardiovascular: Negative for chest pain and leg swelling.  Gastrointestinal: Positive for abdominal pain (intermittent sensation of contractions on R side).  Negative for heartburn, nausea, vomiting and diarrhea.  Genitourinary: Negative for dysuria, urgency, frequency and hematuria.  Musculoskeletal: Positive for back pain (intermittent LBP). Negative for falls.  Skin: Negative for rash.  Neurological: Negative for headaches.   Physical Exam   Blood pressure 131/70, pulse 77, temperature 98.6 F (37 C), temperature source Oral, resp. rate 16, height 5' 3.5" (1.613 m), weight 212 lb (96.163 kg), last menstrual period 11/09/2014.  Physical Exam  Constitutional: She is oriented to person, place, and time. She appears well-developed and well-nourished. No distress.  HENT:  Head: Normocephalic and atraumatic.  Eyes: EOM are normal. No scleral icterus.  Neck: Normal range of motion. Neck supple.  Cardiovascular: Normal rate, regular rhythm, normal heart sounds and intact distal pulses.  Respiratory: Effort normal and breath sounds normal. She has no wheezes.  GI: Soft. Bowel sounds are normal. There is no tenderness. There is no rebound and no guarding.  gravid  Genitourinary: Vagina normal. No vaginal discharge found.  No discharge or bleeding appreciated  Musculoskeletal: Normal range of motion. She exhibits no edema or tenderness.  Neurological: She is alert and oriented to person, place, and time.  Skin: Skin is warm and dry. No rash noted.  Psychiatric: She has a normal mood and affect. Her behavior is normal. Judgment and thought content normal.  Dilation: Fingertip Effacement (%): Thick Cervical Position: Posterior Exam by:: Dr Deniece Ree Moderate consistency, on exam cervix, gentle exam revealed cervix 0.5-1cm, could not adequately confirm 1cm, therefore  0.5cm documented.  H&P done in MAU last night by Dr. Deniece Ree      Assessment and Plan  Patient is 24 y.o. G2P0100 [redacted]wks gestation with cervical shortening on Prometrium and 17-OH-P.  Per MFM recommends rescue cerclage Patient desires surgical management with rescue McDonald  cerclage.  The risks of surgery were discussed in detail with the patient including but not limited to: bleeding which may require transfusion or reoperation; infection which may require prolonged hospitalization or re-hospitalization and antibiotic therapy; injury to bowel, bladder, ureters and major vessels or other surrounding organs; rupture of membranes and  Possible fetal loss.  I also reviewed with patient that there is no guarantee that this procedure will work to stop the progress of cervical change.  I reviewed the risk of thromboembolic phenomenon, incisional problems and other postoperative or anesthesia complications.  The postoperative expectations were also discussed in detail. The patient also understands the alternative treatment options which were discussed in full. All questions were answered.

## 2015-04-05 NOTE — Transfer of Care (Signed)
Immediate Anesthesia Transfer of Care Note  Patient: Kim Johns  Procedure(s) Performed: Procedure(s): CERCLAGE CERVICAL (N/A)  Patient Location: PACU  Anesthesia Type:Spinal  Level of Consciousness: awake, alert , oriented and patient cooperative  Airway & Oxygen Therapy: Patient Spontanous Breathing  Post-op Assessment: Report given to RN and Post -op Vital signs reviewed and stable  Post vital signs: Reviewed and stable  Last Vitals:  Filed Vitals:   04/05/15 1234  BP: 114/73  Pulse: 95  Temp: 37.1 C  Resp: 18    Complications: No apparent anesthesia complications

## 2015-04-06 ENCOUNTER — Encounter (HOSPITAL_COMMUNITY): Payer: Self-pay | Admitting: Obstetrics & Gynecology

## 2015-04-06 ENCOUNTER — Telehealth: Payer: Self-pay | Admitting: *Deleted

## 2015-04-06 NOTE — Telephone Encounter (Signed)
Pt contacted the clinic and requested a different antibiotic than the one prescribed after her cerclage that is a Class D.  Contacted patient, informed patient that we had discussed with Dr. Ihor Dow and patient should not take Doxycycline.  Pt is covered by the flagyl.  Pt verbalizes understanding and has no further questions.

## 2015-04-07 ENCOUNTER — Telehealth: Payer: Self-pay | Admitting: *Deleted

## 2015-04-07 NOTE — Telephone Encounter (Signed)
Pt left message on nurse voice mail stating that she had a cerclage placed on 5/11. She is having a mucousy vaginal d/c and thinks she has lost her mucous plug. Please call back.

## 2015-04-10 ENCOUNTER — Other Ambulatory Visit (HOSPITAL_COMMUNITY): Payer: Self-pay | Admitting: Maternal and Fetal Medicine

## 2015-04-10 ENCOUNTER — Ambulatory Visit (HOSPITAL_COMMUNITY)
Admission: RE | Admit: 2015-04-10 | Discharge: 2015-04-10 | Disposition: A | Discharge status: Home / Self Care | Payer: Medicaid Other | Source: Ambulatory Visit | Attending: Family Medicine | Admitting: Family Medicine

## 2015-04-10 ENCOUNTER — Encounter (HOSPITAL_COMMUNITY): Payer: Self-pay

## 2015-04-10 ENCOUNTER — Ambulatory Visit (INDEPENDENT_AMBULATORY_CARE_PROVIDER_SITE_OTHER): Payer: Medicaid Other

## 2015-04-10 VITALS — BP 121/70 | HR 100 | Wt 216.8 lb

## 2015-04-10 VITALS — BP 120/73 | HR 92 | Wt 215.3 lb

## 2015-04-10 DIAGNOSIS — O09212 Supervision of pregnancy with history of pre-term labor, second trimester: Secondary | ICD-10-CM

## 2015-04-10 DIAGNOSIS — Z8751 Personal history of pre-term labor: Secondary | ICD-10-CM

## 2015-04-10 DIAGNOSIS — O09292 Supervision of pregnancy with other poor reproductive or obstetric history, second trimester: Secondary | ICD-10-CM | POA: Diagnosis not present

## 2015-04-10 DIAGNOSIS — O26872 Cervical shortening, second trimester: Secondary | ICD-10-CM | POA: Insufficient documentation

## 2015-04-10 DIAGNOSIS — O42919 Preterm premature rupture of membranes, unspecified as to length of time between rupture and onset of labor, unspecified trimester: Secondary | ICD-10-CM

## 2015-04-10 DIAGNOSIS — O3430 Maternal care for cervical incompetence, unspecified trimester: Secondary | ICD-10-CM | POA: Insufficient documentation

## 2015-04-10 DIAGNOSIS — Z3A21 21 weeks gestation of pregnancy: Secondary | ICD-10-CM | POA: Insufficient documentation

## 2015-04-15 ENCOUNTER — Encounter (HOSPITAL_COMMUNITY): Payer: Self-pay | Admitting: *Deleted

## 2015-04-15 ENCOUNTER — Inpatient Hospital Stay (HOSPITAL_COMMUNITY)
Admission: AD | Admit: 2015-04-15 | Discharge: 2015-04-15 | Disposition: A | Discharge status: Home / Self Care | Payer: Medicaid Other | Source: Ambulatory Visit | Attending: Obstetrics & Gynecology | Admitting: Obstetrics & Gynecology

## 2015-04-15 DIAGNOSIS — O26872 Cervical shortening, second trimester: Secondary | ICD-10-CM | POA: Insufficient documentation

## 2015-04-15 DIAGNOSIS — Z87891 Personal history of nicotine dependence: Secondary | ICD-10-CM | POA: Insufficient documentation

## 2015-04-15 DIAGNOSIS — Z3A22 22 weeks gestation of pregnancy: Secondary | ICD-10-CM | POA: Diagnosis not present

## 2015-04-15 DIAGNOSIS — M545 Low back pain, unspecified: Secondary | ICD-10-CM

## 2015-04-15 DIAGNOSIS — Z79899 Other long term (current) drug therapy: Secondary | ICD-10-CM | POA: Insufficient documentation

## 2015-04-15 DIAGNOSIS — J45909 Unspecified asthma, uncomplicated: Secondary | ICD-10-CM | POA: Diagnosis not present

## 2015-04-15 DIAGNOSIS — O26892 Other specified pregnancy related conditions, second trimester: Secondary | ICD-10-CM | POA: Insufficient documentation

## 2015-04-15 DIAGNOSIS — O2692 Pregnancy related conditions, unspecified, second trimester: Secondary | ICD-10-CM

## 2015-04-15 LAB — URINALYSIS, ROUTINE W REFLEX MICROSCOPIC
Bilirubin Urine: NEGATIVE
Glucose, UA: NEGATIVE mg/dL
Hgb urine dipstick: NEGATIVE
Ketones, ur: NEGATIVE mg/dL
Nitrite: NEGATIVE
Protein, ur: NEGATIVE mg/dL
Specific Gravity, Urine: 1.01 (ref 1.005–1.030)
Urobilinogen, UA: 0.2 mg/dL (ref 0.0–1.0)
pH: 5.5 (ref 5.0–8.0)

## 2015-04-15 LAB — URINE MICROSCOPIC-ADD ON

## 2015-04-15 LAB — WET PREP, GENITAL
CLUE CELLS WET PREP: NONE SEEN
TRICH WET PREP: NONE SEEN
YEAST WET PREP: NONE SEEN

## 2015-04-15 NOTE — Discharge Instructions (Signed)
Call the clinic [phone]) or go to [facility] if:  You begin to have strong, frequent contractions  Your water breaks.  Sometimes it is a big gush of fluid, sometimes it is just a trickle that keeps getting your panties wet or running down your legs  You have vaginal bleeding.  It is normal to have a small amount of spotting if your cervix was checked.   You don't feel your baby moving like normal.  If you don't, get you something to eat and drink and lay down and focus on feeling your baby move.  You should feel at least 10 movements in 2 hours.  If you don't, you should call the office or go to [facility].   Back Exercises Back exercises help treat and prevent back injuries. The goal of back exercises is to increase the strength of your abdominal and back muscles and the flexibility of your back. These exercises should be started when you no longer have back pain. Back exercises include:  Pelvic Tilt. Lie on your back with your knees bent. Tilt your pelvis until the lower part of your back is against the floor. Hold this position 5 to 10 sec and repeat 5 to 10 times.  Knee to Chest. Pull first 1 knee up against your chest and hold for 20 to 30 seconds, repeat this with the other knee, and then both knees. This may be done with the other leg straight or bent, whichever feels better.  Sit-Ups or Curl-Ups. Bend your knees 90 degrees. Start with tilting your pelvis, and do a partial, slow sit-up, lifting your trunk only 30 to 45 degrees off the floor. Take at least 2 to 3 seconds for each sit-up. Do not do sit-ups with your knees out straight. If partial sit-ups are difficult, simply do the above but with only tightening your abdominal muscles and holding it as directed.  Hip-Lift. Lie on your back with your knees flexed 90 degrees. Push down with your feet and shoulders as you raise your hips a couple inches off the floor; hold for 10 seconds, repeat 5 to 10 times.  Back arches. Lie  on your stomach, propping yourself up on bent elbows. Slowly press on your hands, causing an arch in your low back. Repeat 3 to 5 times. Any initial stiffness and discomfort should lessen with repetition over time.  Shoulder-Lifts. Lie face down with arms beside your body. Keep hips and torso pressed to floor as you slowly lift your head and shoulders off the floor. Do not overdo your exercises, especially in the beginning. Exercises may cause you some mild back discomfort which lasts for a few minutes; however, if the pain is more severe, or lasts for more than 15 minutes, do not continue exercises until you see your caregiver. Improvement with exercise therapy for back problems is slow.  See your caregivers for assistance with developing a proper back exercise program. Document Released: 12/19/2004 Document Revised: 02/03/2012 Document Reviewed: 09/12/2011 [facility] Patient Information 2015 [place], [place]. This information is not intended to replace advice given to you by your health care provider. Make sure you discuss any questions you have with your health care provider. Back Pain in Pregnancy Back pain during pregnancy is common. It happens in about half of all pregnancies. It is important for you and your baby that you remain active during your pregnancy.If you feel that back pain is not allowing you to remain active or sleep well, it is time to see  your caregiver. Back pain may be caused by several factors related to changes during your pregnancy.Fortunately, unless you had trouble with your back before your pregnancy, the pain is likely to get better after you deliver. Low back pain usually occurs between the fifth and seventh months of pregnancy. It can, however, happen in the first couple months. Factors that increase the risk of back problems include:  Previous back problems. Injury to your back. Having twins or multiple births. A chronic cough. Stress. Job-related repetitive  motions. Muscle or spinal disease in the back. Family history of back problems, ruptured (herniated) discs, or osteoporosis. Depression, anxiety, and panic attacks. CAUSES  When you are pregnant, your body produces a hormone called relaxin. This hormonemakes the ligaments connecting the low back and pubic bones more flexible. This flexibility allows the baby to be delivered more easily. When your ligaments are loose, your muscles need to work harder to support your back. Soreness in your back can come from tired muscles. Soreness can also come from back tissues that are irritated since they are receiving less support. As the baby grows, it puts pressure on the nerves and blood vessels in your pelvis. This can cause back pain. As the baby grows and gets heavier during pregnancy, the uterus pushes the stomach muscles forward and changes your center of gravity. This makes your back muscles work harder to maintain good posture. SYMPTOMS  Lumbar pain during pregnancy Lumbar pain during pregnancy usually occurs at or above the waist in the center of the back. There may be pain and numbness that radiates into your leg or foot. This is similar to low back pain experienced by non-pregnant women. It usually increases with sitting for long periods of time, standing, or repetitive lifting. Tenderness may also be present in the muscles along your upper back. Posterior pelvic pain during pregnancy Pain in the back of the pelvis is more common than lumbar pain in pregnancy. It is a deep pain felt in your side at the waistline, or across the tailbone (sacrum), or in both places. You may have pain on one or both sides. This pain can also go into the buttocks and backs of the upper thighs. Pubic and groin pain may also be present. The pain does not quickly resolve with rest, and morning stiffness may also be present. Pelvic pain during pregnancy can be brought on by most activities. A high level of fitness before and  during pregnancy may or may not prevent this problem. Labor pain is usually 1 to 2 minutes apart, lasts for about 1 minute, and involves a bearing down feeling or pressure in your pelvis. However, if you are at term with the pregnancy, constant low back pain can be the beginning of early labor, and you should be aware of this. DIAGNOSIS  X-rays of the back should not be done during the first 12 to 14 weeks of the pregnancy and only when absolutely necessary during the rest of the pregnancy. MRIs do not give off radiation and are safe during pregnancy. MRIs also should only be done when absolutely necessary. HOME CARE INSTRUCTIONS Exercise as directed by your caregiver. Exercise is the most effective way to prevent or manage back pain. If you have a back problem, it is especially important to avoid sports that require sudden body movements. Swimming and walking are great activities. Do not stand in one place for long periods of time. Do not wear high heels. Sit in chairs with good posture. Use a  pillow on your lower back if necessary. Make sure your head rests over your shoulders and is not hanging forward. Try sleeping on your side, preferably the left side, with a pillow or two between your legs. If you are sore after a night's rest, your bedmay betoo soft.Try placing a board between your mattress and box spring. Listen to your body when lifting.If you are experiencing pain, ask for help or try bending yourknees more so you can use your leg muscles rather than your back muscles. Squat down when picking up something from the floor. Do not bend over. Eat a healthy diet. Try to gain weight within your caregiver's recommendations. Use heat or cold packs 3 to 4 times a day for 15 minutes to help with the pain. Only take over-the-counter or prescription medicines for pain, discomfort, or fever as directed by your caregiver. Sudden (acute) back pain Use bed rest for only the most extreme, acute episodes  of back pain. Prolonged bed rest over 48 hours will aggravate your condition. Ice is very effective for acute conditions. Put ice in a plastic bag. Place a towel between your skin and the bag. Leave the ice on for 10 to 20 minutes every 2 hours, or as needed. Using heat packs for 30 minutes prior to activities is also helpful. Continued back pain See your caregiver if you have continued problems. Your caregiver can help or refer you for appropriate physical therapy. With conditioning, most back problems can be avoided. Sometimes, a more serious issue may be the cause of back pain. You should be seen right away if new problems seem to be developing. Your caregiver may recommend: A maternity girdle. An elastic sling. A back brace. A massage therapist or acupuncture. SEEK MEDICAL CARE IF:  You are not able to do most of your daily activities, even when taking the pain medicine you were given. You need a referral to a physical therapist or chiropractor. You want to try acupuncture. SEEK IMMEDIATE MEDICAL CARE IF: You develop numbness, tingling, weakness, or problems with the use of your arms or legs. You develop severe back pain that is no longer relieved with medicines. You have a sudden change in bowel or bladder control. You have increasing pain in other areas of the body. You develop shortness of breath, dizziness, or fainting. You develop nausea, vomiting, or sweating. You have back pain which is similar to labor pains. You have back pain along with your water breaking or vaginal bleeding. You have back pain or numbness that travels down your leg. Your back pain developed after you fell. You develop pain on one side of your back. You may have a kidney stone. You see blood in your urine. You may have a bladder infection or kidney stone. You have back pain with blisters. You may have shingles. Back pain is fairly common during pregnancy but should not be accepted as just part of the  process. Back pain should always be treated as soon as possible. This will make your pregnancy as pleasant as possible. Document Released: 02/19/2006 Document Revised: 02/03/2012 Document Reviewed: 04/02/2011 [facility] Patient Information 2015 [place], [place]. This information is not intended to replace advice given to you by your health care provider. Make sure you discuss any questions you have with your health care provider.  Preterm Labor Information Preterm labor is when labor starts at less than 37 weeks of pregnancy. The normal length of a pregnancy is 39 to 41 weeks. CAUSES Often, there is no identifiable  underlying cause as to why a woman goes into preterm labor. One of the most common known causes of preterm labor is infection. Infections of the uterus, cervix, vagina, amniotic sac, bladder, kidney, or even the lungs (pneumonia) can cause labor to start. Other suspected causes of preterm labor include:   Urogenital infections, such as yeast infections and bacterial vaginosis.   Uterine abnormalities (uterine shape, uterine septum, fibroids, or bleeding from the placenta).   A cervix that has been operated on (it may fail to stay closed).   Malformations in the fetus.   Multiple gestations (twins, triplets, and so on).   Breakage of the amniotic sac.  RISK FACTORS  Having a previous history of preterm labor.   Having premature rupture of membranes (PROM).   Having a placenta that covers the opening of the cervix (placenta previa).   Having a placenta that separates from the uterus (placental abruption).   Having a cervix that is too weak to hold the fetus in the uterus (incompetent cervix).   Having too much fluid in the amniotic sac (polyhydramnios).   Taking illegal drugs or smoking while pregnant.   Not gaining enough weight while pregnant.   Being younger than 72 and older than 25 years old.   Having a low socioeconomic status.   Being African  American. SYMPTOMS Signs and symptoms of preterm labor include:   Menstrual-like cramps, abdominal pain, or back pain.  Uterine contractions that are regular, as frequent as six in an hour, regardless of their intensity (may be mild or painful).  Contractions that start on the top of the uterus and spread down to the lower abdomen and back.   A sense of increased pelvic pressure.   A watery or bloody mucus discharge that comes from the vagina.  TREATMENT Depending on the length of the pregnancy and other circumstances, your health care provider may suggest bed rest. If necessary, there are medicines that can be given to stop contractions and to mature the fetal lungs. If labor happens before 34 weeks of pregnancy, a prolonged hospital stay may be recommended. Treatment depends on the condition of both you and the fetus.  WHAT SHOULD YOU DO IF YOU THINK YOU ARE IN PRETERM LABOR? Call your health care provider right away. You will need to go to the hospital to get checked immediately. HOW CAN YOU PREVENT PRETERM LABOR IN FUTURE PREGNANCIES? You should:   Stop smoking if you smoke.  Maintain healthy weight gain and avoid chemicals and drugs that are not necessary.  Be watchful for any type of infection.  Inform your health care provider if you have a known history of preterm labor. Document Released: 02/01/2004 Document Revised: 07/14/2013 Document Reviewed: 12/14/2012 [facility] Patient Information 2015 [place], [place]. This information is not intended to replace advice given to you by your health care provider. Make sure you discuss any questions you have with your health care provider.

## 2015-04-15 NOTE — MAU Note (Signed)
Pt. Here for back pain that began today and is unsure if she has a bladder infection due to frequency and urge to urinate. Denies bleeding or new discharge. Next appointment with OB is Monday.

## 2015-04-15 NOTE — MAU Provider Note (Signed)
History  Chief Complaint:  Urinary Tract Infection and Back Pain  Kim Johns is a 24 y.o. G33P0100 female at [redacted]w[redacted]d presenting w/ report of constant Lt lower back pain. Took warm shower and flexeril at home and has eased some, but w/ hx just wanted to make sure all was ok.    Reports active fetal movement, contractions: none, vaginal bleeding: none, membranes: intact. Denies abnormal/malodorous vag d/c or vulvovaginal itching/irritation.  Does have d/c, but was told that was normal w/ cerclage & prometrium. Increased urinary frequency, denies dysuria, urgency, or other uti s/s.  Had rescue [employer] cerclage placed 5/11 d/t CL 2.0 w/ funneling on u/s and h/o 20wk pprom w/ neonatal death.  Prenatal care at [facility].  Next visit Monday. Pregnancy complicated by h/o 76HM pprom w/ ptb and neonatal death, cervical shortening w/ [employer] cerclage, doing nightly prometrium pv, and weekly Makena.   Obstetrical History: OB History    Gravida Para Term Preterm AB TAB SAB Ectopic Multiple Living   2 1 0 1 0 0 0 0 0 0       Past Medical History: Past Medical History  Diagnosis Date  . Allergy   . Asthma     albuterol last used a few months ago  . Preterm labor     Past Surgical History: Past Surgical History  Procedure Laterality Date  . Wisdom tooth extraction    . Cervical cerclage N/A 04/05/2015    Procedure: CERCLAGE CERVICAL;  Surgeon: Lavonia Drafts, MD;  Location: [facility];  Service: Gynecology;  Laterality: N/A;    Social History: History   Social History  . Marital Status: Single    Spouse Name: N/A  . Number of Children: N/A  . Years of Education: N/A   Social History Main Topics  . Smoking status: Former Smoker -- 0.50 packs/day for 10 years    Types: Cigarettes  . Smokeless tobacco: Never Used  . Alcohol Use: No  . Drug Use: No  . Sexual Activity: Not Currently    Birth Control/ Protection: None   Other Topics Concern  . None   Social History Narrative     Allergies: Allergies  Allergen Reactions  . Shellfish Allergy Hives  . Sulfa Antibiotics Other (See Comments)    Reaction:  Unknown; childhood reaction  . Icy Hot Rash    Facility-administered medications prior to admission  Medication Dose Route Frequency Provider Last Rate Last Dose  . hydroxyprogesterone caproate (DELALUTIN) 250 mg/mL injection 250 mg  250 mg Intramuscular Weekly Nila Nephew, MD   250 mg at 04/10/15 1033   Prescriptions prior to admission  Medication Sig Dispense Refill Last Dose  . cyclobenzaprine (FLEXERIL) 5 MG tablet Take 1 tablet (5 mg total) by mouth 3 (three) times daily as needed for muscle spasms. 30 tablet 0 04/15/2015 at Unknown time  . Prenatal Vit-Fe Fumarate-FA (PRENATAL MULTIVITAMIN) TABS tablet Take 1 tablet by mouth daily.   04/15/2015 at Unknown time  . progesterone (PROMETRIUM) 200 MG capsule Take 1 capsule (200 mg total) by mouth daily. (Patient taking differently: Place 200 mg vaginally at bedtime. ) 30 capsule 1 04/14/2015 at Unknown time  . doxycycline (VIBRAMYCIN) 100 MG capsule Take 1 capsule (100 mg total) by mouth 2 (two) times daily. (Patient not taking: Reported on 04/15/2015) 10 capsule 0 Taking    Review of Systems  Pertinent pos/neg as indicated in HPI  Physical Exam  Blood pressure 135/80, pulse 112, temperature 98.7 F (37.1 C), temperature source Oral, resp. rate  18, last menstrual period 11/09/2014. General appearance: alert, cooperative and no distress Lungs: clear to auscultation bilaterally, normal effort Heart: regular rate and rhythm Abdomen: gravid, soft, non-tender Back: no tenderness to palpation  Spec exam: cx visually closed, small amount thick white nonodorous d/c c/w prometrium residual SVE deferred Cultures/Specimens: wet prep, gc/ct    Presentation: unsure  Fetal monitoring: FHR: 145 doppler  MAU Course  UA, urine cx Spec exam w/ wet prep, gc/ct  Labs:  Results for orders placed or performed during  the hospital encounter of 04/15/15 (from the past 24 hour(s))  Urinalysis, Routine w reflex microscopic     Status: Abnormal   Collection Time: 04/15/15  2:25 PM  Result Value Ref Range   Color, Urine YELLOW YELLOW   APPearance CLEAR CLEAR   Specific Gravity, Urine 1.010 1.005 - 1.030   pH 5.5 5.0 - 8.0   Glucose, UA NEGATIVE NEGATIVE mg/dL   Hgb urine dipstick NEGATIVE NEGATIVE   Bilirubin Urine NEGATIVE NEGATIVE   Ketones, ur NEGATIVE NEGATIVE mg/dL   Protein, ur NEGATIVE NEGATIVE mg/dL   Urobilinogen, UA 0.2 0.0 - 1.0 mg/dL   Nitrite NEGATIVE NEGATIVE   Leukocytes, UA SMALL (A) NEGATIVE  Urine microscopic-add on     Status: Abnormal   Collection Time: 04/15/15  2:25 PM  Result Value Ref Range   Squamous Epithelial / LPF FEW (A) RARE   WBC, UA 3-6 <3 WBC/hpf   RBC / HPF 0-2 <3 RBC/hpf   Bacteria, UA FEW (A) RARE  Wet prep, genital     Status: Abnormal   Collection Time: 04/15/15  3:15 PM  Result Value Ref Range   Yeast Wet Prep HPF POC NONE SEEN NONE SEEN   Trich, Wet Prep NONE SEEN NONE SEEN   Clue Cells Wet Prep HPF POC NONE SEEN NONE SEEN   WBC, Wet Prep HPF POC FEW (A) NONE SEEN    Imaging:  n/a  Assessment and Plan  A:  [redacted]w[redacted]d SIUP  G2P0100  LBP, likely musculoskeletal  +FHT  H/O 20wk pprom w/ ptb/neonatal death  [employer] cerclage d/t short cx P:  D/C home  Urine cx, gc/ct pending  Reviewed ptl s/s, fm, reasons to return  Discussed LBP prevention/relief measures, has flexeril at home if needed  Continue nightly prometrium and weekly Makena  Keep next appt at [facility] on Monday as scheduled   Tawnya Crook CNM,WHNP-BC 5/21/20163:51 PM

## 2015-04-16 LAB — CULTURE, OB URINE
CULTURE: NO GROWTH
Colony Count: NO GROWTH
Special Requests: NORMAL

## 2015-04-17 ENCOUNTER — Ambulatory Visit (HOSPITAL_COMMUNITY)
Admission: RE | Admit: 2015-04-17 | Discharge: 2015-04-17 | Disposition: A | Discharge status: Home / Self Care | Payer: Medicaid Other | Source: Ambulatory Visit | Attending: Obstetrics and Gynecology | Admitting: Obstetrics and Gynecology

## 2015-04-17 ENCOUNTER — Ambulatory Visit (INDEPENDENT_AMBULATORY_CARE_PROVIDER_SITE_OTHER): Payer: Medicaid Other | Admitting: Obstetrics and Gynecology

## 2015-04-17 ENCOUNTER — Encounter (HOSPITAL_COMMUNITY): Payer: Self-pay

## 2015-04-17 VITALS — BP 124/89 | HR 138 | Temp 99.0°F | Wt 216.1 lb

## 2015-04-17 DIAGNOSIS — O09292 Supervision of pregnancy with other poor reproductive or obstetric history, second trimester: Secondary | ICD-10-CM

## 2015-04-17 DIAGNOSIS — O26872 Cervical shortening, second trimester: Secondary | ICD-10-CM

## 2015-04-17 DIAGNOSIS — O09212 Supervision of pregnancy with history of pre-term labor, second trimester: Secondary | ICD-10-CM | POA: Diagnosis not present

## 2015-04-17 DIAGNOSIS — O0992 Supervision of high risk pregnancy, unspecified, second trimester: Secondary | ICD-10-CM | POA: Diagnosis not present

## 2015-04-17 DIAGNOSIS — Z8751 Personal history of pre-term labor: Secondary | ICD-10-CM

## 2015-04-17 DIAGNOSIS — Z3A22 22 weeks gestation of pregnancy: Secondary | ICD-10-CM | POA: Insufficient documentation

## 2015-04-17 DIAGNOSIS — O42919 Preterm premature rupture of membranes, unspecified as to length of time between rupture and onset of labor, unspecified trimester: Secondary | ICD-10-CM

## 2015-04-17 DIAGNOSIS — O3442 Maternal care for other abnormalities of cervix, second trimester: Secondary | ICD-10-CM

## 2015-04-17 DIAGNOSIS — Z3A20 20 weeks gestation of pregnancy: Secondary | ICD-10-CM

## 2015-04-17 DIAGNOSIS — O3432 Maternal care for cervical incompetence, second trimester: Secondary | ICD-10-CM

## 2015-04-17 DIAGNOSIS — O0991 Supervision of high risk pregnancy, unspecified, first trimester: Secondary | ICD-10-CM

## 2015-04-17 LAB — POCT URINALYSIS DIP (DEVICE)
Bilirubin Urine: NEGATIVE
Glucose, UA: 100 mg/dL — AB
Hgb urine dipstick: NEGATIVE
LEUKOCYTES UA: NEGATIVE
NITRITE: NEGATIVE
PH: 6 (ref 5.0–8.0)
Protein, ur: 30 mg/dL — AB
Specific Gravity, Urine: >=1.03 (ref 1.005–1.030)
UROBILINOGEN UA: 0.2 mg/dL (ref 0.0–1.0)

## 2015-04-17 LAB — GC/CHLAMYDIA PROBE AMP (~~LOC~~) NOT AT ARMC
Chlamydia: NEGATIVE
Neisseria Gonorrhea: NEGATIVE

## 2015-04-17 NOTE — Progress Notes (Signed)
24 y.o. G2P0100 at [redacted]w[redacted]d here for routine OB visit. Doing well today.  1. History of preterm labor. Continue 17P weekly through 36 weeks.  2. Cervical insufficiency. Status post rescue cerclage 5/11. Continue vaginal prometrium. Checking serial cervical lengths. Last CL stable at 2.4. Next scheduled for later today. 3. Routine PNC. Labs reviewed. VB/FM/PTL precautions reviewed.

## 2015-04-17 NOTE — Telephone Encounter (Signed)
Pt seen in clinic this morning.

## 2015-04-25 ENCOUNTER — Encounter (HOSPITAL_COMMUNITY): Payer: Self-pay | Admitting: *Deleted

## 2015-04-25 ENCOUNTER — Inpatient Hospital Stay (HOSPITAL_COMMUNITY)
Admission: AD | Admit: 2015-04-25 | Discharge: 2015-04-25 | Disposition: A | Discharge status: Home / Self Care | Payer: Medicaid Other | Source: Ambulatory Visit | Attending: Family Medicine | Admitting: Family Medicine

## 2015-04-25 ENCOUNTER — Ambulatory Visit (INDEPENDENT_AMBULATORY_CARE_PROVIDER_SITE_OTHER): Payer: Medicaid Other

## 2015-04-25 VITALS — BP 133/73 | HR 99 | Temp 98.7°F | Wt 217.9 lb

## 2015-04-25 DIAGNOSIS — N898 Other specified noninflammatory disorders of vagina: Secondary | ICD-10-CM | POA: Diagnosis present

## 2015-04-25 DIAGNOSIS — O9989 Other specified diseases and conditions complicating pregnancy, childbirth and the puerperium: Secondary | ICD-10-CM | POA: Diagnosis not present

## 2015-04-25 DIAGNOSIS — Z87891 Personal history of nicotine dependence: Secondary | ICD-10-CM | POA: Diagnosis not present

## 2015-04-25 DIAGNOSIS — O09212 Supervision of pregnancy with history of pre-term labor, second trimester: Secondary | ICD-10-CM | POA: Diagnosis present

## 2015-04-25 DIAGNOSIS — N9489 Other specified conditions associated with female genital organs and menstrual cycle: Secondary | ICD-10-CM | POA: Diagnosis not present

## 2015-04-25 DIAGNOSIS — O99512 Diseases of the respiratory system complicating pregnancy, second trimester: Secondary | ICD-10-CM | POA: Diagnosis not present

## 2015-04-25 DIAGNOSIS — Z8751 Personal history of pre-term labor: Secondary | ICD-10-CM

## 2015-04-25 DIAGNOSIS — Z3A23 23 weeks gestation of pregnancy: Secondary | ICD-10-CM | POA: Insufficient documentation

## 2015-04-25 DIAGNOSIS — O99891 Other specified diseases and conditions complicating pregnancy: Secondary | ICD-10-CM

## 2015-04-25 DIAGNOSIS — J45909 Unspecified asthma, uncomplicated: Secondary | ICD-10-CM | POA: Insufficient documentation

## 2015-04-25 LAB — URINALYSIS, ROUTINE W REFLEX MICROSCOPIC
Bilirubin Urine: NEGATIVE
Glucose, UA: 250 mg/dL — AB
Hgb urine dipstick: NEGATIVE
KETONES UR: NEGATIVE mg/dL
LEUKOCYTES UA: NEGATIVE
NITRITE: NEGATIVE
PH: 6 (ref 5.0–8.0)
PROTEIN: NEGATIVE mg/dL
Specific Gravity, Urine: 1.025 (ref 1.005–1.030)
Urobilinogen, UA: 1 mg/dL (ref 0.0–1.0)

## 2015-04-25 LAB — WET PREP, GENITAL
CLUE CELLS WET PREP: NONE SEEN
TRICH WET PREP: NONE SEEN
Yeast Wet Prep HPF POC: NONE SEEN

## 2015-04-25 MED ORDER — PROGESTERONE MICRONIZED 200 MG PO CAPS: 200.0000 mg | ORAL_CAPSULE | Freq: Every day | ORAL | Status: DC

## 2015-04-25 NOTE — MAU Provider Note (Signed)
Chief Complaint: Vaginal Discharge   First Provider Initiated Contact with Patient 04/25/15 1030      SUBJECTIVE HPI: Kim Johns is a 24 y.o. G2P0100 at [redacted]w[redacted]d by LMP who presents to Maternity Admissions reporting vaginal odor x 3 days. No LOF, vaginal bleeding, vaginal discharge. Using vaginal progesterone suppositories and has cerclage for cervical insufficiency. Seen at [facility] this morning for 17-P injection and didn't voice any concerns.   Past Medical History  Diagnosis Date  . Allergy   . Asthma     albuterol last used a few months ago  . Preterm labor    OB History  Gravida Para Term Preterm AB SAB TAB Ectopic Multiple Living  2 1 0 1 0 0 0 0 0 0     # Outcome Date GA Lbr Len/2nd Weight Sex Delivery Anes PTL Lv  2 Current           1 Preterm 06/17/14 [redacted]w[redacted]d 03:59 0 lb (0 kg) F  None  FD     Complications: Premature rupture of membranes in second trimester     Past Surgical History  Procedure Laterality Date  . Wisdom tooth extraction    . Cervical cerclage N/A 04/05/2015    Procedure: CERCLAGE CERVICAL;  Surgeon: Lavonia Drafts, MD;  Location: [facility];  Service: Gynecology;  Laterality: N/A;   History   Social History  . Marital Status: Single    Spouse Name: N/A  . Number of Children: N/A  . Years of Education: N/A   Occupational History  . Not on file.   Social History Main Topics  . Smoking status: Former Smoker -- 0.50 packs/day for 10 years    Types: Cigarettes  . Smokeless tobacco: Never Used  . Alcohol Use: No  . Drug Use: No  . Sexual Activity: Not Currently    Birth Control/ Protection: None   Other Topics Concern  . Not on file   Social History Narrative   No current facility-administered medications on file prior to encounter.   Current Outpatient Prescriptions on File Prior to Encounter  Medication Sig Dispense Refill  . cyclobenzaprine (FLEXERIL) 5 MG tablet Take 1 tablet (5 mg total) by mouth 3 (three) times daily as needed for  muscle spasms. 30 tablet 0  . Prenatal Vit-Fe Fumarate-FA (PRENATAL MULTIVITAMIN) TABS tablet Take 1 tablet by mouth daily.     Allergies  Allergen Reactions  . Shellfish Allergy Hives  . Sulfa Antibiotics Other (See Comments)    Reaction:  Unknown; childhood reaction  . Icy Hot Rash    Review of Systems  Constitutional: Negative for fever and chills.  Gastrointestinal: Negative for abdominal pain.  Genitourinary:       Pos for vaginal odor. Neg for VB, LOF, vaginal discharge.    OBJECTIVE Blood pressure 111/65, pulse 99, temperature 98.6 F (37 C), temperature source Oral, resp. rate 18, last menstrual period 11/09/2014. GENERAL: Well-developed, well-nourished female in no acute distress.  HEART: normal rate RESP: normal effort GI: Abdomen soft, non-tender.  NEURO: Alert and oriented PELVIC EXAM: NEFG, Blind wet prep BIMANUAL: Deferred FHR 145 by doppler.   LAB RESULTS Results for orders placed or performed during the hospital encounter of 04/25/15 (from the past 24 hour(s))  Urinalysis, Routine w reflex microscopic (not at [facility])     Status: Abnormal   Collection Time: 04/25/15  9:34 AM  Result Value Ref Range   Color, Urine YELLOW YELLOW   APPearance CLEAR CLEAR   Specific Gravity, Urine 1.025 1.005 -  1.030   pH 6.0 5.0 - 8.0   Glucose, UA 250 (A) NEGATIVE mg/dL   Hgb urine dipstick NEGATIVE NEGATIVE   Bilirubin Urine NEGATIVE NEGATIVE   Ketones, ur NEGATIVE NEGATIVE mg/dL   Protein, ur NEGATIVE NEGATIVE mg/dL   Urobilinogen, UA 1.0 0.0 - 1.0 mg/dL   Nitrite NEGATIVE NEGATIVE   Leukocytes, UA NEGATIVE NEGATIVE  Wet prep, genital     Status: Abnormal   Collection Time: 04/25/15 10:08 AM  Result Value Ref Range   Yeast Wet Prep HPF POC NONE SEEN NONE SEEN   Trich, Wet Prep NONE SEEN NONE SEEN   Clue Cells Wet Prep HPF POC NONE SEEN NONE SEEN   WBC, Wet Prep HPF POC FEW (A) NONE SEEN    IMAGING NA  MAU COURSE Wet prep, UA  ASSESSMENT 1. Vaginal odor   2.  Current maternal conditions classifiable elsewhere, antepartum     PLAN Discharge home in stable condition. Continue prometrium. PTL precautions.  Follow-up Information    Follow up with [facility] On 05/01/2015.   Specialty:  Obstetrics and Gynecology   Why:  Routine prenatal visit   Contact information:   [place] [phone]      Follow up with [facility] Today.   Specialty:  Maternal and Fetal Medicine   Contact information:   [address] [id number] [place] [phone]      Please follow up.   Why:  As needed in emergencies       Medication List    TAKE these medications        cyclobenzaprine 5 MG tablet  Commonly known as:  FLEXERIL  Take 1 tablet (5 mg total) by mouth 3 (three) times daily as needed for muscle spasms.     hydroxyprogesterone caproate 250 mg/mL Oil injection  Commonly known as:  DELALUTIN  Inject 250 mg into the muscle every 7 (seven) days. On Mondays in clinic     prenatal multivitamin Tabs tablet  Take 1 tablet by mouth daily.     progesterone 200 MG capsule  Commonly known as:  PROMETRIUM  Place 1 capsule (200 mg total) vaginally at bedtime.         [place], CNM 04/25/2015  10:41 AM

## 2015-04-25 NOTE — Progress Notes (Signed)
Patient here today for weekly injection of 17P. 41ml 17P administered into RUO quadrant of buttocks. Patient to return next week for injection. Denies any questions, concerns, or problems.

## 2015-04-25 NOTE — MAU Note (Signed)
States she has noted vaginal odor. States she is using prometrium supps every night so it is difficult to assess vaginal discharge. Has a cerclage.

## 2015-04-25 NOTE — Discharge Instructions (Signed)
Second Trimester of Pregnancy The second trimester is from week 13 through week 28, months 4 through 6. The second trimester is often a time when you feel your best. Your body has also adjusted to being pregnant, and you begin to feel better physically. Usually, morning sickness has lessened or quit completely, you may have more energy, and you may have an increase in appetite. The second trimester is also a time when the fetus is growing rapidly. At the end of the sixth month, the fetus is about 9 inches long and weighs about 1 pounds. You will likely begin to feel the baby move (quickening) between 18 and 20 weeks of the pregnancy. BODY CHANGES Your body goes through many changes during pregnancy. The changes vary from woman to woman.   Your weight will continue to increase. You will notice your lower abdomen bulging out.  You may begin to get stretch marks on your hips, abdomen, and breasts.  You may develop headaches that can be relieved by medicines approved by your health care provider.  You may urinate more often because the fetus is pressing on your bladder.  You may develop or continue to have heartburn as a result of your pregnancy.  You may develop constipation because certain hormones are causing the muscles that push waste through your intestines to slow down.  You may develop hemorrhoids or swollen, bulging veins (varicose veins).  You may have back pain because of the weight gain and pregnancy hormones relaxing your joints between the bones in your pelvis and as a result of a shift in weight and the muscles that support your balance.  Your breasts will continue to grow and be tender.  Your gums may bleed and may be sensitive to brushing and flossing.  Dark spots or blotches (chloasma, mask of pregnancy) may develop on your face. This will likely fade after the baby is born.  A dark line from your belly button to the pubic area (linea nigra) may appear. This will likely fade  after the baby is born.  You may have changes in your hair. These can include thickening of your hair, rapid growth, and changes in texture. Some women also have hair loss during or after pregnancy, or hair that feels dry or thin. Your hair will most likely return to normal after your baby is born. WHAT TO EXPECT AT YOUR PRENATAL VISITS During a routine prenatal visit:  You will be weighed to make sure you and the fetus are growing normally.  Your blood pressure will be taken.  Your abdomen will be measured to track your baby's growth.  The fetal heartbeat will be listened to.  Any test results from the previous visit will be discussed. Your health care provider may ask you:  How you are feeling.  If you are feeling the baby move.  If you have had any abnormal symptoms, such as leaking fluid, bleeding, severe headaches, or abdominal cramping.  If you have any questions. Other tests that may be performed during your second trimester include:  Blood tests that check for:  Low iron levels (anemia).  Gestational diabetes (between 24 and 28 weeks).  Rh antibodies.  Urine tests to check for infections, diabetes, or protein in the urine.  An ultrasound to confirm the proper growth and development of the baby.  An amniocentesis to check for possible genetic problems.  Fetal screens for spina bifida and Down syndrome. HOME CARE INSTRUCTIONS   Avoid all smoking, herbs, alcohol, and unprescribed   drugs. These chemicals affect the formation and growth of the baby.  Follow your health care provider's instructions regarding medicine use. There are medicines that are either safe or unsafe to take during pregnancy.  Exercise only as directed by your health care provider. Experiencing uterine cramps is a good sign to stop exercising.  Continue to eat regular, healthy meals.  Wear a good support bra for breast tenderness.  Do not use hot tubs, steam rooms, or saunas.  Wear your  seat belt at all times when driving.  Avoid raw meat, uncooked cheese, cat litter boxes, and soil used by cats. These carry germs that can cause birth defects in the baby.  Take your prenatal vitamins.  Try taking a stool softener (if your health care provider approves) if you develop constipation. Eat more high-fiber foods, such as fresh vegetables or fruit and whole grains. Drink plenty of fluids to keep your urine clear or pale yellow.  Take warm sitz baths to soothe any pain or discomfort caused by hemorrhoids. Use hemorrhoid cream if your health care provider approves.  If you develop varicose veins, wear support hose. Elevate your feet for 15 minutes, 3-4 times a day. Limit salt in your diet.  Avoid heavy lifting, wear low heel shoes, and practice good posture.  Rest with your legs elevated if you have leg cramps or low back pain.  Visit your dentist if you have not gone yet during your pregnancy. Use a soft toothbrush to brush your teeth and be gentle when you floss.  A sexual relationship may be continued unless your health care provider directs you otherwise.  Continue to go to all your prenatal visits as directed by your health care provider. SEEK MEDICAL CARE IF:   You have dizziness.  You have mild pelvic cramps, pelvic pressure, or nagging pain in the abdominal area.  You have persistent nausea, vomiting, or diarrhea.  You have a bad smelling vaginal discharge.  You have pain with urination. SEEK IMMEDIATE MEDICAL CARE IF:   You have a fever.  You are leaking fluid from your vagina.  You have spotting or bleeding from your vagina.  You have severe abdominal cramping or pain.  You have rapid weight gain or loss.  You have shortness of breath with chest pain.  You notice sudden or extreme swelling of your face, hands, ankles, feet, or legs.  You have not felt your baby move in over an hour.  You have severe headaches that do not go away with  medicine.  You have vision changes. Document Released: 11/05/2001 Document Revised: 11/16/2013 Document Reviewed: 01/12/2013 ExitCare Patient Information 2015 [employer]. This information is not intended to replace advice given to you by your health care provider. Make sure you discuss any questions you have with your health care provider.  

## 2015-04-26 ENCOUNTER — Ambulatory Visit (HOSPITAL_COMMUNITY)
Admission: RE | Admit: 2015-04-26 | Discharge: 2015-04-26 | Disposition: A | Discharge status: Home / Self Care | Payer: Medicaid Other | Source: Ambulatory Visit | Attending: Internal Medicine | Admitting: Internal Medicine

## 2015-04-26 ENCOUNTER — Ambulatory Visit (HOSPITAL_COMMUNITY)
Admission: RE | Admit: 2015-04-26 | Discharge: 2015-04-26 | Disposition: A | Discharge status: Home / Self Care | Payer: Medicaid Other | Source: Ambulatory Visit | Attending: Obstetrics and Gynecology | Admitting: Obstetrics and Gynecology

## 2015-04-26 DIAGNOSIS — Z3A24 24 weeks gestation of pregnancy: Secondary | ICD-10-CM | POA: Diagnosis not present

## 2015-04-26 DIAGNOSIS — O26872 Cervical shortening, second trimester: Secondary | ICD-10-CM | POA: Diagnosis present

## 2015-04-26 DIAGNOSIS — O09292 Supervision of pregnancy with other poor reproductive or obstetric history, second trimester: Secondary | ICD-10-CM

## 2015-04-26 DIAGNOSIS — Z8751 Personal history of pre-term labor: Secondary | ICD-10-CM

## 2015-04-26 DIAGNOSIS — O09212 Supervision of pregnancy with history of pre-term labor, second trimester: Secondary | ICD-10-CM | POA: Diagnosis not present

## 2015-04-26 MED ORDER — BETAMETHASONE SOD PHOS & ACET 6 (3-3) MG/ML IJ SUSP
12.0000 mg | Freq: Once | INTRAMUSCULAR | Status: AC
  Administered 2015-04-26: 12 mg via INTRAMUSCULAR
  Filled 2015-04-26: qty 2

## 2015-04-27 ENCOUNTER — Ambulatory Visit (HOSPITAL_COMMUNITY)
Admission: RE | Admit: 2015-04-27 | Discharge: 2015-04-27 | Disposition: A | Discharge status: Home / Self Care | Payer: Medicaid Other | Source: Ambulatory Visit | Attending: Family Medicine | Admitting: Family Medicine

## 2015-04-27 DIAGNOSIS — O09212 Supervision of pregnancy with history of pre-term labor, second trimester: Secondary | ICD-10-CM | POA: Insufficient documentation

## 2015-04-27 DIAGNOSIS — Z3A24 24 weeks gestation of pregnancy: Secondary | ICD-10-CM | POA: Diagnosis not present

## 2015-04-27 DIAGNOSIS — O26872 Cervical shortening, second trimester: Secondary | ICD-10-CM | POA: Diagnosis not present

## 2015-04-27 MED ORDER — BETAMETHASONE SOD PHOS & ACET 6 (3-3) MG/ML IJ SUSP
12.0000 mg | Freq: Once | INTRAMUSCULAR | Status: AC
  Administered 2015-04-27: 12 mg via INTRAMUSCULAR
  Filled 2015-04-27: qty 2

## 2015-05-01 ENCOUNTER — Ambulatory Visit (INDEPENDENT_AMBULATORY_CARE_PROVIDER_SITE_OTHER): Payer: Medicaid Other | Admitting: Family Medicine

## 2015-05-01 ENCOUNTER — Ambulatory Visit (HOSPITAL_COMMUNITY)
Admission: RE | Admit: 2015-05-01 | Discharge: 2015-05-01 | Disposition: A | Discharge status: Home / Self Care | Payer: Medicaid Other | Source: Ambulatory Visit | Attending: Obstetrics and Gynecology | Admitting: Obstetrics and Gynecology

## 2015-05-01 ENCOUNTER — Encounter (HOSPITAL_COMMUNITY): Payer: Self-pay

## 2015-05-01 VITALS — BP 127/76 | HR 110 | Temp 98.7°F | Wt 218.1 lb

## 2015-05-01 VITALS — BP 116/55 | HR 95 | Wt 220.0 lb

## 2015-05-01 DIAGNOSIS — O26872 Cervical shortening, second trimester: Secondary | ICD-10-CM | POA: Diagnosis present

## 2015-05-01 DIAGNOSIS — Z8751 Personal history of pre-term labor: Secondary | ICD-10-CM | POA: Diagnosis not present

## 2015-05-01 DIAGNOSIS — Z3A22 22 weeks gestation of pregnancy: Secondary | ICD-10-CM

## 2015-05-01 DIAGNOSIS — O3432 Maternal care for cervical incompetence, second trimester: Secondary | ICD-10-CM

## 2015-05-01 DIAGNOSIS — O3442 Maternal care for other abnormalities of cervix, second trimester: Secondary | ICD-10-CM

## 2015-05-01 DIAGNOSIS — O09212 Supervision of pregnancy with history of pre-term labor, second trimester: Secondary | ICD-10-CM | POA: Insufficient documentation

## 2015-05-01 DIAGNOSIS — O0991 Supervision of high risk pregnancy, unspecified, first trimester: Secondary | ICD-10-CM

## 2015-05-01 DIAGNOSIS — O09292 Supervision of pregnancy with other poor reproductive or obstetric history, second trimester: Secondary | ICD-10-CM

## 2015-05-01 DIAGNOSIS — Z3A24 24 weeks gestation of pregnancy: Secondary | ICD-10-CM | POA: Diagnosis not present

## 2015-05-01 LAB — POCT URINALYSIS DIP (DEVICE)
Bilirubin Urine: NEGATIVE
GLUCOSE, UA: NEGATIVE mg/dL
Ketones, ur: NEGATIVE mg/dL
NITRITE: NEGATIVE
Protein, ur: 30 mg/dL — AB
Specific Gravity, Urine: 1.025 (ref 1.005–1.030)
Urobilinogen, UA: 0.2 mg/dL (ref 0.0–1.0)
pH: 6 (ref 5.0–8.0)

## 2015-05-01 NOTE — Patient Instructions (Signed)
Preterm Labor Information Preterm labor is when labor starts before you are [redacted] weeks pregnant. The normal length of pregnancy is 39 to 41 weeks.  CAUSES  The cause of preterm labor is not often known. The most common known cause is infection. RISK FACTORS  Having a history of preterm labor.  Having your water break before it should.  Having a placenta that covers the opening of the cervix.  Having a placenta that breaks away from the uterus.  Having a cervix that is too weak to hold the baby in the uterus.  Having too much fluid in the amniotic sac.  Taking drugs or smoking while pregnant.  Not gaining enough weight while pregnant.  Being younger than 32 and older than 25 years old.  Having a low income.  Being African American. SYMPTOMS  Period-like cramps, belly (abdominal) pain, or back pain.  Contractions that are regular, as often as six in an hour. They may be mild or painful.  Contractions that start at the top of the belly. They then move to the lower belly and back.  Lower belly pressure that seems to get stronger.  Bleeding from the vagina.  Fluid leaking from the vagina. TREATMENT  Treatment depends on:  Your condition.  The condition of your baby.  How many weeks pregnant you are. Your doctor may have you:  Take medicine to stop contractions.  Stay in bed except to use the restroom (bed rest).  Stay in the hospital. WHAT SHOULD YOU DO IF YOU THINK YOU ARE IN PRETERM LABOR? Call your doctor right away. You need to go to the hospital right away.  HOW CAN YOU PREVENT PRETERM LABOR IN FUTURE PREGNANCIES?  Stop smoking, if you smoke.  Maintain healthy weight gain.  Do not take drugs or be around chemicals that are not needed.  Tell your doctor if you think you have an infection.  Tell your doctor if you had a preterm labor before. Document Released: 02/07/2009 Document Revised: 09/01/2013 Document Reviewed: 02/07/2009 [facility] Patient  Information 2015 [place], [place]. This information is not intended to replace advice given to you by your health care provider. Make sure you discuss any questions you have with your health care provider.

## 2015-05-01 NOTE — Progress Notes (Signed)
Subjective:   Cia Garretson is a 24 y.o. G2P0100 at [redacted]w[redacted]d being seen today for her obstetrical visit.  Patient reports pelvic bone pain.   Contractions: Contractions: Not present.   Vaginal Bleeding Vag. Bleeding: None.   Fetal Movement: Movement: Present.  Denies contractions, vaginal bleeding or leaking of fluid.  Reports good fetal movement.  Has been receiving 17-OH progesterone - no side effects to the medication.  No site reaction.   Has been getting cervical length checked weekly.  Last 3 measurements: 1.9cm, 1.9cm, then 1.2cm on last [place].  Received 2 doses of BMZ on 6/1 and 6/2.  Has repeat US later today. Has cerclage in place - no vaginal bleeding or leaking fluid. Also uses prometrium.  The following portions of the patient's history were reviewed and updated as appropriate: allergies, current medications, past family history, past medical history, past social history, past surgical history and problem list.   Objective:  BP 127/76 mmHg  Pulse 110  Temp(Src) 98.7 F (37.1 C)  Wt 218 lb 1.6 oz (98.93 kg)  LMP 11/09/2014 Fetal Heart Rate: Fetal Heart Rate (bpm): 160  Fundal Height:  25 cm  Fetal Movement: Movement: Present  Fetal Presentation:     Abdomen: Soft, gravid, appropriate for gestational age.  Pain/Pressure: Pain/Pressure: Absent     Extremities: Edema:     Urinalysis: Protein:   Glucose:   No results found for this or any previous visit (from the past 24 hour(s)).   Assessment and Plan:   Pregnancy:  G2P0100 at [redacted]w[redacted]d  1. Supervision of high risk pregnancy in first trimester FHR normal.    2. History of preterm delivery Continue weekly 17-OH progesterone.  3. Cervical insufficiency in pregnancy, antepartum, second trimester Continue prometrium, cervical length measurements.  Preterm labor symptoms: vaginal bleeding, contractions and leaking of fluid reviewed in detail.  Fetal movement precautions reviewed.  Follow up in 2 weeks.   Truett Mainland, DO

## 2015-05-01 NOTE — Progress Notes (Signed)
Breastfeeding tip of the week reviewed 17P injection Pt reports pain in pelvic bone

## 2015-05-01 NOTE — Progress Notes (Signed)
Blood:small, Leukocytes: small

## 2015-05-01 NOTE — ED Notes (Signed)
Pt having "pelvic bone" pain.

## 2015-05-02 DIAGNOSIS — O3432 Maternal care for cervical incompetence, second trimester: Secondary | ICD-10-CM | POA: Insufficient documentation

## 2015-05-02 DIAGNOSIS — O09299 Supervision of pregnancy with other poor reproductive or obstetric history, unspecified trimester: Secondary | ICD-10-CM | POA: Insufficient documentation

## 2015-05-02 DIAGNOSIS — Z3A24 24 weeks gestation of pregnancy: Secondary | ICD-10-CM | POA: Insufficient documentation

## 2015-05-08 ENCOUNTER — Encounter (HOSPITAL_COMMUNITY): Payer: Self-pay

## 2015-05-08 ENCOUNTER — Ambulatory Visit (HOSPITAL_COMMUNITY)
Admission: RE | Admit: 2015-05-08 | Discharge: 2015-05-08 | Disposition: A | Discharge status: Home / Self Care | Payer: Medicaid Other | Source: Ambulatory Visit | Attending: Internal Medicine | Admitting: Internal Medicine

## 2015-05-08 ENCOUNTER — Ambulatory Visit (INDEPENDENT_AMBULATORY_CARE_PROVIDER_SITE_OTHER): Payer: Medicaid Other

## 2015-05-08 VITALS — Wt 219.6 lb

## 2015-05-08 DIAGNOSIS — Z8751 Personal history of pre-term labor: Secondary | ICD-10-CM

## 2015-05-08 DIAGNOSIS — O09212 Supervision of pregnancy with history of pre-term labor, second trimester: Secondary | ICD-10-CM | POA: Diagnosis present

## 2015-05-08 DIAGNOSIS — O3442 Maternal care for other abnormalities of cervix, second trimester: Secondary | ICD-10-CM | POA: Diagnosis not present

## 2015-05-08 DIAGNOSIS — O09292 Supervision of pregnancy with other poor reproductive or obstetric history, second trimester: Secondary | ICD-10-CM | POA: Diagnosis not present

## 2015-05-08 DIAGNOSIS — O3432 Maternal care for cervical incompetence, second trimester: Secondary | ICD-10-CM

## 2015-05-08 DIAGNOSIS — Z3A25 25 weeks gestation of pregnancy: Secondary | ICD-10-CM | POA: Insufficient documentation

## 2015-05-08 NOTE — Progress Notes (Signed)
Patient here today for weekly injection of 17P. 54ml 17P administered into RUO quadrant of buttocks. Patient tolerated well. To follow up in 1 week for next injection.

## 2015-05-10 ENCOUNTER — Ambulatory Visit: Payer: Medicaid Other | Attending: Internal Medicine | Admitting: Physician Assistant

## 2015-05-10 VITALS — BP 131/80 | HR 91 | Temp 98.0°F | Resp 18 | Wt 220.8 lb

## 2015-05-10 DIAGNOSIS — L989 Disorder of the skin and subcutaneous tissue, unspecified: Secondary | ICD-10-CM | POA: Diagnosis not present

## 2015-05-10 NOTE — Progress Notes (Signed)
Chief Complaint: Bump on left hand  Subjective: This is a 24 year old woman who is [redacted] weeks gestation who is presenting with an abnormal growth on the pinky finger of the left hand for several months. She states that it started off as just a red dot. It does look like a blood-filled blister. This was very small initially but has continued to grow and has not gotten any better over the last several months. She took a safety plan and pick it last night no improvement. She has not had any fevers or chills.  ROS:  GEN: denies fever or chills, denies change in weight Skin: + lesions or rashes   Objective:  Filed Vitals:   05/10/15 1131  BP: 131/80  Pulse: 91  Temp: 98 F (36.7 C)  TempSrc: Oral  Resp: 18  Weight: 220 lb 12.8 oz (100.154 kg)  SpO2: 99%    Physical Exam: General: in no acute distress. Skin: Was unremarkable Extremities: There is a small blood field growth on the external aspect of the fifth digit on the left hand. Normal range of motion. Nontender. No evidence of infection.   Medications: Prior to Admission medications   Medication Sig Start Date End Date Taking? Authorizing Provider  cyclobenzaprine (FLEXERIL) 5 MG tablet Take 1 tablet (5 mg total) by mouth 3 (three) times daily as needed for muscle spasms. 04/04/15   Nila Nephew, MD  hydroxyprogesterone caproate (DELALUTIN) 250 mg/mL OIL injection Inject 250 mg into the muscle every 7 (seven) days. On Mondays in clinic    Historical Provider, MD  Prenatal Vit-Fe Fumarate-FA (PRENATAL MULTIVITAMIN) TABS tablet Take 1 tablet by mouth daily.    Historical Provider, MD  progesterone (PROMETRIUM) 200 MG capsule Place 1 capsule (200 mg total) vaginally at bedtime. 04/25/15   Manya Silvas, CNM    Assessment: 1. Abnormal growth on fifth digit of left finger  2. [redacted] weeks gestation  Plan: Dermatology referral Keep taking MVI and seeing OB as scheduled  Follow up: As scheduled  This note has been created with  Surveyor, quantity. Any transcriptional errors are unintentional.   Zettie Pho, PA-C 05/10/2015, 12:26 PM

## 2015-05-10 NOTE — Progress Notes (Signed)
C/o blister on left pinky finger that's been there for months Did stick safety pin in blister States blister has been bleeding Covered with band-aid

## 2015-05-12 ENCOUNTER — Other Ambulatory Visit (HOSPITAL_COMMUNITY): Payer: Self-pay | Admitting: Maternal and Fetal Medicine

## 2015-05-12 DIAGNOSIS — O26872 Cervical shortening, second trimester: Secondary | ICD-10-CM

## 2015-05-12 DIAGNOSIS — Z8751 Personal history of pre-term labor: Secondary | ICD-10-CM

## 2015-05-15 ENCOUNTER — Encounter (HOSPITAL_COMMUNITY): Payer: Self-pay

## 2015-05-15 ENCOUNTER — Other Ambulatory Visit (HOSPITAL_COMMUNITY): Payer: Self-pay | Admitting: Maternal and Fetal Medicine

## 2015-05-15 ENCOUNTER — Ambulatory Visit (HOSPITAL_COMMUNITY)
Admission: RE | Admit: 2015-05-15 | Discharge: 2015-05-15 | Disposition: A | Discharge status: Home / Self Care | Payer: Medicaid Other | Source: Ambulatory Visit | Attending: Family Medicine | Admitting: Family Medicine

## 2015-05-15 ENCOUNTER — Ambulatory Visit (INDEPENDENT_AMBULATORY_CARE_PROVIDER_SITE_OTHER): Payer: Medicaid Other | Admitting: Obstetrics and Gynecology

## 2015-05-15 VITALS — BP 136/75 | HR 96 | Wt 221.9 lb

## 2015-05-15 DIAGNOSIS — O26872 Cervical shortening, second trimester: Secondary | ICD-10-CM

## 2015-05-15 DIAGNOSIS — Z23 Encounter for immunization: Secondary | ICD-10-CM

## 2015-05-15 DIAGNOSIS — Z8751 Personal history of pre-term labor: Secondary | ICD-10-CM

## 2015-05-15 DIAGNOSIS — O1213 Gestational proteinuria, third trimester: Secondary | ICD-10-CM | POA: Diagnosis not present

## 2015-05-15 DIAGNOSIS — O09212 Supervision of pregnancy with history of pre-term labor, second trimester: Secondary | ICD-10-CM | POA: Diagnosis not present

## 2015-05-15 DIAGNOSIS — Z3A26 26 weeks gestation of pregnancy: Secondary | ICD-10-CM | POA: Diagnosis not present

## 2015-05-15 DIAGNOSIS — O0991 Supervision of high risk pregnancy, unspecified, first trimester: Secondary | ICD-10-CM

## 2015-05-15 LAB — CBC
HCT: 34.9 % — ABNORMAL LOW (ref 36.0–46.0)
Hemoglobin: 11.8 g/dL — ABNORMAL LOW (ref 12.0–15.0)
MCH: 32.2 pg (ref 26.0–34.0)
MCHC: 33.8 g/dL (ref 30.0–36.0)
MCV: 95.4 fL (ref 78.0–100.0)
MPV: 10 fL (ref 8.6–12.4)
Platelets: 221 10*3/uL (ref 150–400)
RBC: 3.66 MIL/uL — ABNORMAL LOW (ref 3.87–5.11)
RDW: 12.9 % (ref 11.5–15.5)
WBC: 8.8 10*3/uL (ref 4.0–10.5)

## 2015-05-15 LAB — POCT URINALYSIS DIP (DEVICE)
BILIRUBIN URINE: NEGATIVE
Glucose, UA: NEGATIVE mg/dL
HGB URINE DIPSTICK: NEGATIVE
KETONES UR: NEGATIVE mg/dL
Nitrite: NEGATIVE
PROTEIN: NEGATIVE mg/dL
SPECIFIC GRAVITY, URINE: 1.015 (ref 1.005–1.030)
Urobilinogen, UA: 0.2 mg/dL (ref 0.0–1.0)
pH: 7 (ref 5.0–8.0)

## 2015-05-15 MED ORDER — TETANUS-DIPHTH-ACELL PERTUSSIS 5-2.5-18.5 LF-MCG/0.5 IM SUSP
0.5000 mL | Freq: Once | INTRAMUSCULAR | Status: AC
  Administered 2015-05-15: 0.5 mL via INTRAMUSCULAR

## 2015-05-15 NOTE — Progress Notes (Signed)
Subjective:  Kim Johns is a 24 y.o. G2P0100 at [redacted]w[redacted]d being seen today for ongoing prenatal care.  Patient reports no complaints.  Contractions: Not present.  Vag. Bleeding: None. Movement: Present. Denies leaking of fluid.   The following portions of the patient's history were reviewed and updated as appropriate: allergies, current medications, past family history, past medical history, past social history, past surgical history and problem list.   Objective:   Filed Vitals:   05/15/15 1010  BP: 136/75  Pulse: 96  Weight: 100.653 kg (221 lb 14.4 oz)    Fetal Status: Fetal Heart Rate (bpm): 142   Movement: Present     General:  Alert, oriented and cooperative. Patient is in no acute distress.  Skin: Skin is warm and dry. No rash noted.   Cardiovascular: Normal heart rate noted  Respiratory: Effort and breath sounds normal, no problems with respiration noted  Abdomen: Soft, gravid, appropriate for gestational age. Pain/Pressure: Absent     Vaginal: Vag. Bleeding: None.       Cervix: Not evaluated       Extremities: Normal range of motion.  Edema: None  Mental Status: Normal mood and affect. Normal behavior. Normal judgment and thought content.   Urinalysis: Urine Protein: Negative Urine Glucose: Negative  Assessment and Plan:  Pregnancy: G2P0100 at [redacted]w[redacted]d  1. History of preterm delivery Routine PNC - Glucose Tolerance, 1 HR (50g) w/o Fasting - CBC - RPR - HIV antibody (with reflex)  2. Need for Tdap vaccination Given today - Tdap (BOOSTRIX) injection 0.5 mL; Inject 0.5 mLs into the muscle once.   Preterm labor symptoms and general obstetric precautions including but not limited to vaginal bleeding, contractions, leaking of fluid and fetal movement were reviewed in detail with the patient.  Please refer to After Visit Summary for other counseling recommendations.   Return in about 2 weeks (around 05/29/2015) for hrob.   Josephine Cables, MD

## 2015-05-16 LAB — RPR

## 2015-05-16 LAB — GLUCOSE TOLERANCE, 1 HOUR (50G) W/O FASTING: GLUCOSE 1 HOUR GTT: 121 mg/dL (ref 70–140)

## 2015-05-16 LAB — HIV ANTIBODY (ROUTINE TESTING W REFLEX): HIV 1&2 Ab, 4th Generation: NONREACTIVE

## 2015-05-22 ENCOUNTER — Ambulatory Visit (INDEPENDENT_AMBULATORY_CARE_PROVIDER_SITE_OTHER): Payer: Medicaid Other

## 2015-05-22 VITALS — BP 134/73 | HR 96 | Wt 221.4 lb

## 2015-05-22 DIAGNOSIS — O09213 Supervision of pregnancy with history of pre-term labor, third trimester: Secondary | ICD-10-CM

## 2015-05-22 DIAGNOSIS — Z8751 Personal history of pre-term labor: Secondary | ICD-10-CM

## 2015-05-28 ENCOUNTER — Inpatient Hospital Stay (HOSPITAL_COMMUNITY)
Admission: AD | Admit: 2015-05-28 | Discharge: 2015-05-28 | Disposition: A | Discharge status: Home / Self Care | Payer: Medicaid Other | Source: Ambulatory Visit | Attending: Obstetrics & Gynecology | Admitting: Obstetrics & Gynecology

## 2015-05-28 ENCOUNTER — Encounter (HOSPITAL_COMMUNITY): Payer: Self-pay | Admitting: *Deleted

## 2015-05-28 DIAGNOSIS — O2343 Unspecified infection of urinary tract in pregnancy, third trimester: Secondary | ICD-10-CM | POA: Diagnosis not present

## 2015-05-28 DIAGNOSIS — Z87891 Personal history of nicotine dependence: Secondary | ICD-10-CM | POA: Insufficient documentation

## 2015-05-28 DIAGNOSIS — Z3A28 28 weeks gestation of pregnancy: Secondary | ICD-10-CM | POA: Diagnosis not present

## 2015-05-28 DIAGNOSIS — O368130 Decreased fetal movements, third trimester, not applicable or unspecified: Secondary | ICD-10-CM | POA: Insufficient documentation

## 2015-05-28 LAB — URINE MICROSCOPIC-ADD ON

## 2015-05-28 LAB — URINALYSIS, ROUTINE W REFLEX MICROSCOPIC
Bilirubin Urine: NEGATIVE
Glucose, UA: 500 mg/dL — AB
HGB URINE DIPSTICK: NEGATIVE
KETONES UR: NEGATIVE mg/dL
NITRITE: NEGATIVE
Protein, ur: NEGATIVE mg/dL
Urobilinogen, UA: 0.2 mg/dL (ref 0.0–1.0)
pH: 6 (ref 5.0–8.0)

## 2015-05-28 MED ORDER — NITROFURANTOIN MONOHYD MACRO 100 MG PO CAPS: 100.0000 mg | ORAL_CAPSULE | Freq: Two times a day (BID) | ORAL | Status: DC

## 2015-05-28 NOTE — MAU Note (Signed)
Patient presents at 104 weeks gestation with c/o decreased fetal movement since yesterday morning and urinary frequency X couple days. States she has felt limited movement but not as much as usual. Denies bleeding or discharge.

## 2015-05-28 NOTE — MAU Provider Note (Signed)
History     CSN: [id number]  Arrival date and time: 05/28/15 1142   First Provider Initiated Contact with Patient 05/28/15 1245      Chief Complaint  Patient presents with  . Decreased Fetal Movement  . Urinary Tract Infection   HPI Patient is 24 y.o. G2P0100 [redacted]w[redacted]d here with complaints of decreased fetal movement x2 days. States she has a "lazy" baby who doesn't normally move that much to begin with, but movement had decreased for the 2 days.   Denies LOF, VB, contractions, vaginal discharge.  Pt has hx of fetal demise in previous pregnancy at [redacted]w[redacted]d. Currently has cerclage in place.    Past Medical History  Diagnosis Date  . Allergy   . Asthma     albuterol last used a few months ago  . Preterm labor     Past Surgical History  Procedure Laterality Date  . Wisdom tooth extraction    . Cervical cerclage N/A 04/05/2015    Procedure: CERCLAGE CERVICAL;  Surgeon: Lavonia Drafts, MD;  Location: [facility];  Service: Gynecology;  Laterality: N/A;    Family History  Problem Relation Age of Onset  . Diabetes Mother   . Stroke Mother   . Hypertension Father     History  Substance Use Topics  . Smoking status: Former Smoker -- 0.50 packs/day for 10 years    Types: Cigarettes  . Smokeless tobacco: Never Used  . Alcohol Use: No    Allergies:  Allergies  Allergen Reactions  . Shellfish Allergy Hives  . Sulfa Antibiotics Other (See Comments)    Reaction:  Unknown; childhood reaction  . Icy Hot Rash    Facility-administered medications prior to admission  Medication Dose Route Frequency Provider Last Rate Last Dose  . hydroxyprogesterone caproate (DELALUTIN) 250 mg/mL injection 250 mg  250 mg Intramuscular Weekly Nila Nephew, MD   250 mg at 05/22/15 1117   Prescriptions prior to admission  Medication Sig Dispense Refill Last Dose  . Prenatal Vit-Fe Fumarate-FA (PRENATAL MULTIVITAMIN) TABS tablet Take 1 tablet by mouth daily.   05/28/2015 at Unknown time  .  progesterone (PROMETRIUM) 200 MG capsule Place 1 capsule (200 mg total) vaginally at bedtime. 30 capsule 1 05/27/2015 at Unknown time  . cyclobenzaprine (FLEXERIL) 5 MG tablet Take 1 tablet (5 mg total) by mouth 3 (three) times daily as needed for muscle spasms. (Patient not taking: Reported on 05/15/2015) 30 tablet 0 Not Taking  . hydroxyprogesterone caproate (DELALUTIN) 250 mg/mL OIL injection Inject 250 mg into the muscle every 7 (seven) days. On Mondays in clinic   05/22/2015    Review of Systems  Constitutional: Negative for fever, chills and malaise/fatigue.  HENT: Negative for congestion.   Eyes: Negative for blurred vision and double vision.  Respiratory: Negative for cough and shortness of breath.   Cardiovascular: Negative for chest pain, palpitations, claudication and leg swelling.  Gastrointestinal: Negative for heartburn, nausea, vomiting, abdominal pain, diarrhea and constipation.  Genitourinary: Positive for urgency and frequency. Negative for dysuria and hematuria.  Musculoskeletal: Negative for myalgias and back pain.  Skin: Negative for itching and rash.  Neurological: Negative for dizziness, loss of consciousness and headaches.   Physical Exam   Blood pressure 128/73, pulse 112, temperature 98.4 F (36.9 C), temperature source Oral, resp. rate 16, height 5\' 3"  (1.6 m), weight 221 lb 6 oz (100.415 kg), last menstrual period 11/09/2014.  Physical Exam  Constitutional: She is oriented to person, place, and time. She appears well-developed and well-nourished.  No distress.  HENT:  Head: Normocephalic and atraumatic.  Eyes: Conjunctivae and EOM are normal.  Neck: Normal range of motion. No thyromegaly present.  Cardiovascular: Normal rate, regular rhythm and normal heart sounds.  Exam reveals no gallop and no friction rub.   No murmur heard. Respiratory: Breath sounds normal. No respiratory distress. She has no wheezes. She has no rales.  GI: Soft. Bowel sounds are normal. She  exhibits no distension. There is no tenderness.  Musculoskeletal: Normal range of motion. She exhibits no edema.  Neurological: She is alert and oriented to person, place, and time.  Skin: Skin is warm and dry. No rash noted. No erythema.  Psychiatric: She has a normal mood and affect. Her behavior is normal.    Per nursing, baby was reactive on monitor.   MAU Course  Procedures  MDM UA performed. Fetal monitoring performed. Chart reviewed.   Assessment and Plan  Patient is 24 y.o. G2P0100 [redacted]w[redacted]d reporting decreased FM and urinary symptoms.  - fetal kick counts reinforced - preterm labor precautions -Macrobid for UITI -Discharged to home   Melina Schools 05/28/2015, 12:46 PM   OB fellow attestation:  I have seen and examined this patient; I agree with above documentation in the resident's note.   Sariyah Corcino is a 24 y.o. G2P0100 reporting decreased fetal movement +FM, denies LOF, VB, contractions, vaginal discharge.  PE: BP 128/73 mmHg  Pulse 112  Temp(Src) 98.4 F (36.9 C) (Oral)  Resp 16  Ht 5\' 3"  (1.6 m)  Wt 221 lb 6 oz (100.415 kg)  BMI 39.22 kg/m2  LMP 11/09/2014 Gen: calm comfortable, NAD Resp: normal effort, no distress Abd: gravid  ROS, labs, PMH reviewed NST reactive  Plan: - fetal kick counts reinforced, labor precautions - continue routine follow up in OB clinic - rx macrobid for UTI  Merla Riches, MD 4:05 PM

## 2015-05-28 NOTE — Discharge Instructions (Signed)

## 2015-05-30 ENCOUNTER — Ambulatory Visit (HOSPITAL_COMMUNITY)
Admission: RE | Admit: 2015-05-30 | Discharge: 2015-05-30 | Disposition: A | Discharge status: Home / Self Care | Payer: Medicaid Other | Source: Ambulatory Visit | Attending: Family Medicine | Admitting: Family Medicine

## 2015-05-30 ENCOUNTER — Ambulatory Visit (INDEPENDENT_AMBULATORY_CARE_PROVIDER_SITE_OTHER): Payer: Medicaid Other | Admitting: *Deleted

## 2015-05-30 ENCOUNTER — Other Ambulatory Visit (HOSPITAL_COMMUNITY): Payer: Self-pay | Admitting: Maternal and Fetal Medicine

## 2015-05-30 ENCOUNTER — Encounter (HOSPITAL_COMMUNITY): Payer: Self-pay

## 2015-05-30 DIAGNOSIS — Z8751 Personal history of pre-term labor: Secondary | ICD-10-CM

## 2015-05-30 DIAGNOSIS — O09213 Supervision of pregnancy with history of pre-term labor, third trimester: Secondary | ICD-10-CM | POA: Diagnosis present

## 2015-05-30 DIAGNOSIS — Z3A28 28 weeks gestation of pregnancy: Secondary | ICD-10-CM | POA: Diagnosis not present

## 2015-05-30 DIAGNOSIS — O3433 Maternal care for cervical incompetence, third trimester: Secondary | ICD-10-CM | POA: Insufficient documentation

## 2015-05-30 DIAGNOSIS — O26873 Cervical shortening, third trimester: Secondary | ICD-10-CM | POA: Insufficient documentation

## 2015-05-30 DIAGNOSIS — O26872 Cervical shortening, second trimester: Secondary | ICD-10-CM

## 2015-05-30 DIAGNOSIS — O09291 Supervision of pregnancy with other poor reproductive or obstetric history, first trimester: Secondary | ICD-10-CM | POA: Insufficient documentation

## 2015-06-05 ENCOUNTER — Ambulatory Visit (INDEPENDENT_AMBULATORY_CARE_PROVIDER_SITE_OTHER): Payer: Medicaid Other | Admitting: Family Medicine

## 2015-06-05 VITALS — BP 133/59 | HR 81 | Temp 98.5°F | Wt 221.8 lb

## 2015-06-05 DIAGNOSIS — Z8751 Personal history of pre-term labor: Secondary | ICD-10-CM

## 2015-06-05 DIAGNOSIS — O3433 Maternal care for cervical incompetence, third trimester: Secondary | ICD-10-CM

## 2015-06-05 DIAGNOSIS — O09293 Supervision of pregnancy with other poor reproductive or obstetric history, third trimester: Secondary | ICD-10-CM | POA: Diagnosis not present

## 2015-06-05 DIAGNOSIS — R358 Other polyuria: Secondary | ICD-10-CM

## 2015-06-05 DIAGNOSIS — O3443 Maternal care for other abnormalities of cervix, third trimester: Secondary | ICD-10-CM | POA: Diagnosis not present

## 2015-06-05 DIAGNOSIS — R3589 Other polyuria: Secondary | ICD-10-CM

## 2015-06-05 DIAGNOSIS — O0991 Supervision of high risk pregnancy, unspecified, first trimester: Secondary | ICD-10-CM | POA: Diagnosis not present

## 2015-06-05 LAB — POCT URINALYSIS DIP (DEVICE)
Bilirubin Urine: NEGATIVE
Glucose, UA: NEGATIVE mg/dL
HGB URINE DIPSTICK: NEGATIVE
Ketones, ur: NEGATIVE mg/dL
Nitrite: NEGATIVE
Protein, ur: NEGATIVE mg/dL
SPECIFIC GRAVITY, URINE: 1.02 (ref 1.005–1.030)
UROBILINOGEN UA: 0.2 mg/dL (ref 0.0–1.0)
pH: 7 (ref 5.0–8.0)

## 2015-06-05 LAB — WET PREP, GENITAL
CLUE CELLS WET PREP: NONE SEEN
Trich, Wet Prep: NONE SEEN
Yeast Wet Prep HPF POC: NONE SEEN

## 2015-06-05 NOTE — Progress Notes (Signed)
Pt feels like she has a yeast infection, states discharge in large amount with odor Breastfeeding tip of the week reviewed 17 P injection given

## 2015-06-05 NOTE — Patient Instructions (Signed)
Third Trimester of Pregnancy The third trimester is from week 29 through week 42, months 7 through 9. The third trimester is a time when the fetus is growing rapidly. At the end of the ninth month, the fetus is about 20 inches in length and weighs 6-10 pounds.  BODY CHANGES Your body goes through many changes during pregnancy. The changes vary from woman to woman.   Your weight will continue to increase. You can expect to gain 25-35 pounds (11-16 kg) by the end of the pregnancy.  You may begin to get stretch marks on your hips, abdomen, and breasts.  You may urinate more often because the fetus is moving lower into your pelvis and pressing on your bladder.  You may develop or continue to have heartburn as a result of your pregnancy.  You may develop constipation because certain hormones are causing the muscles that push waste through your intestines to slow down.  You may develop hemorrhoids or swollen, bulging veins (varicose veins).  You may have pelvic pain because of the weight gain and pregnancy hormones relaxing your joints between the bones in your pelvis. Backaches may result from overexertion of the muscles supporting your posture.  You may have changes in your hair. These can include thickening of your hair, rapid growth, and changes in texture. Some women also have hair loss during or after pregnancy, or hair that feels dry or thin. Your hair will most likely return to normal after your baby is born.  Your breasts will continue to grow and be tender. A yellow discharge may leak from your breasts called colostrum.  Your belly button may stick out.  You may feel short of breath because of your expanding uterus.  You may notice the fetus "dropping," or moving lower in your abdomen.  You may have a bloody mucus discharge. This usually occurs a few days to a week before labor begins.  Your cervix becomes thin and soft (effaced) near your due date. WHAT TO EXPECT AT YOUR  PRENATAL EXAMS  You will have prenatal exams every 2 weeks until week 36. Then, you will have weekly prenatal exams. During a routine prenatal visit:  You will be weighed to make sure you and the fetus are growing normally.  Your blood pressure is taken.  Your abdomen will be measured to track your baby's growth.  The fetal heartbeat will be listened to.  Any test results from the previous visit will be discussed.  You may have a cervical check near your due date to see if you have effaced. At around 36 weeks, your caregiver will check your cervix. At the same time, your caregiver will also perform a test on the secretions of the vaginal tissue. This test is to determine if a type of bacteria, Group B streptococcus, is present. Your caregiver will explain this further. Your caregiver may ask you:  What your birth plan is.  How you are feeling.  If you are feeling the baby move.  If you have had any abnormal symptoms, such as leaking fluid, bleeding, severe headaches, or abdominal cramping.  If you have any questions. Other tests or screenings that may be performed during your third trimester include:  Blood tests that check for low iron levels (anemia).  Fetal testing to check the health, activity level, and growth of the fetus. Testing is done if you have certain medical conditions or if there are problems during the pregnancy. FALSE LABOR You may feel small, irregular contractions that   eventually go away. These are called Braxton Hicks contractions, or false labor. Contractions may last for hours, days, or even weeks before true labor sets in. If contractions come at regular intervals, intensify, or become painful, it is best to be seen by your caregiver.  SIGNS OF LABOR   Menstrual-like cramps.  Contractions that are 5 minutes apart or less.  Contractions that start on the top of the uterus and spread down to the lower abdomen and back.  A sense of increased pelvic  pressure or back pain.  A watery or bloody mucus discharge that comes from the vagina. If you have any of these signs before the 37th week of pregnancy, call your caregiver right away. You need to go to the hospital to get checked immediately. HOME CARE INSTRUCTIONS   Avoid all smoking, herbs, alcohol, and unprescribed drugs. These chemicals affect the formation and growth of the baby.  Follow your caregiver's instructions regarding medicine use. There are medicines that are either safe or unsafe to take during pregnancy.  Exercise only as directed by your caregiver. Experiencing uterine cramps is a good sign to stop exercising.  Continue to eat regular, healthy meals.  Wear a good support bra for breast tenderness.  Do not use hot tubs, steam rooms, or saunas.  Wear your seat belt at all times when driving.  Avoid raw meat, uncooked cheese, cat litter boxes, and soil used by cats. These carry germs that can cause birth defects in the baby.  Take your prenatal vitamins.  Try taking a stool softener (if your caregiver approves) if you develop constipation. Eat more high-fiber foods, such as fresh vegetables or fruit and whole grains. Drink plenty of fluids to keep your urine clear or pale yellow.  Take warm sitz baths to soothe any pain or discomfort caused by hemorrhoids. Use hemorrhoid cream if your caregiver approves.  If you develop varicose veins, wear support hose. Elevate your feet for 15 minutes, 3-4 times a day. Limit salt in your diet.  Avoid heavy lifting, wear low heal shoes, and practice good posture.  Rest a lot with your legs elevated if you have leg cramps or low back pain.  Visit your dentist if you have not gone during your pregnancy. Use a soft toothbrush to brush your teeth and be gentle when you floss.  A sexual relationship may be continued unless your caregiver directs you otherwise.  Do not travel far distances unless it is absolutely necessary and only  with the approval of your caregiver.  Take prenatal classes to understand, practice, and ask questions about the labor and delivery.  Make a trial run to the hospital.  Pack your hospital bag.  Prepare the baby's nursery.  Continue to go to all your prenatal visits as directed by your caregiver. SEEK MEDICAL CARE IF:  You are unsure if you are in labor or if your water has broken.  You have dizziness.  You have mild pelvic cramps, pelvic pressure, or nagging pain in your abdominal area.  You have persistent nausea, vomiting, or diarrhea.  You have a bad smelling vaginal discharge.  You have pain with urination. SEEK IMMEDIATE MEDICAL CARE IF:   You have a fever.  You are leaking fluid from your vagina.  You have spotting or bleeding from your vagina.  You have severe abdominal cramping or pain.  You have rapid weight loss or gain.  You have shortness of breath with chest pain.  You notice sudden or extreme swelling   of your face, hands, ankles, feet, or legs.  You have not felt your baby move in over an hour.  You have severe headaches that do not go away with medicine.  You have vision changes. Document Released: 11/05/2001 Document Revised: 11/16/2013 Document Reviewed: 01/12/2013 [employer] Patient Information 2015 [employer]. This information is not intended to replace advice given to you by your health care provider. Make sure you discuss any questions you have with your health care provider.  Breastfeeding Deciding to breastfeed is one of the best choices you can make for you and your baby. A change in hormones during pregnancy causes your breast tissue to grow and increases the number and size of your milk ducts. These hormones also allow proteins, sugars, and fats from your blood supply to make breast milk in your milk-producing glands. Hormones prevent breast milk from being released before your baby is born as well as prompt milk flow after birth. Once  breastfeeding has begun, thoughts of your baby, as well as his or her sucking or crying, can stimulate the release of milk from your milk-producing glands.  BENEFITS OF BREASTFEEDING For Your Baby  Your first milk (colostrum) helps your baby's digestive system function better.   There are antibodies in your milk that help your baby fight off infections.   Your baby has a lower incidence of asthma, allergies, and sudden infant death syndrome.   The nutrients in breast milk are better for your baby than infant formulas and are designed uniquely for your baby's needs.   Breast milk improves your baby's brain development.   Your baby is less likely to develop other conditions, such as childhood obesity, asthma, or type 2 diabetes mellitus.  For You   Breastfeeding helps to create a very special bond between you and your baby.   Breastfeeding is convenient. Breast milk is always available at the correct temperature and costs nothing.   Breastfeeding helps to burn calories and helps you lose the weight gained during pregnancy.   Breastfeeding makes your uterus contract to its prepregnancy size faster and slows bleeding (lochia) after you give birth.   Breastfeeding helps to lower your risk of developing type 2 diabetes mellitus, osteoporosis, and breast or ovarian cancer later in life. SIGNS THAT YOUR BABY IS HUNGRY Early Signs of Hunger  Increased alertness or activity.  Stretching.  Movement of the head from side to side.  Movement of the head and opening of the mouth when the corner of the mouth or cheek is stroked (rooting).  Increased sucking sounds, smacking lips, cooing, sighing, or squeaking.  Hand-to-mouth movements.  Increased sucking of fingers or hands. Late Signs of Hunger  Fussing.  Intermittent crying. Extreme Signs of Hunger Signs of extreme hunger will require calming and consoling before your baby will be able to breastfeed successfully. Do not  wait for the following signs of extreme hunger to occur before you initiate breastfeeding:   Restlessness.  A loud, strong cry.   Screaming. BREASTFEEDING BASICS Breastfeeding Initiation  Find a comfortable place to sit or lie down, with your neck and back well supported.  Place a pillow or rolled up blanket under your baby to bring him or her to the level of your breast (if you are seated). Nursing pillows are specially designed to help support your arms and your baby while you breastfeed.  Make sure that your baby's abdomen is facing your abdomen.   Gently massage your breast. With your fingertips, massage from your chest   wall toward your nipple in a circular motion. This encourages milk flow. You may need to continue this action during the feeding if your milk flows slowly.  Support your breast with 4 fingers underneath and your thumb above your nipple. Make sure your fingers are well away from your nipple and your baby's mouth.   Stroke your baby's lips gently with your finger or nipple.   When your baby's mouth is open wide enough, quickly bring your baby to your breast, placing your entire nipple and as much of the colored area around your nipple (areola) as possible into your baby's mouth.   More areola should be visible above your baby's upper lip than below the lower lip.   Your baby's tongue should be between his or her lower gum and your breast.   Ensure that your baby's mouth is correctly positioned around your nipple (latched). Your baby's lips should create a seal on your breast and be turned out (everted).  It is common for your baby to suck about 2-3 minutes in order to start the flow of breast milk. Latching Teaching your baby how to latch on to your breast properly is very important. An improper latch can cause nipple pain and decreased milk supply for you and poor weight gain in your baby. Also, if your baby is not latched onto your nipple properly, he or she  may swallow some air during feeding. This can make your baby fussy. Burping your baby when you switch breasts during the feeding can help to get rid of the air. However, teaching your baby to latch on properly is still the best way to prevent fussiness from swallowing air while breastfeeding. Signs that your baby has successfully latched on to your nipple:    Silent tugging or silent sucking, without causing you pain.   Swallowing heard between every 3-4 sucks.    Muscle movement above and in front of his or her ears while sucking.  Signs that your baby has not successfully latched on to nipple:   Sucking sounds or smacking sounds from your baby while breastfeeding.  Nipple pain. If you think your baby has not latched on correctly, slip your finger into the corner of your baby's mouth to break the suction and place it between your baby's gums. Attempt breastfeeding initiation again. Signs of Successful Breastfeeding Signs from your baby:   A gradual decrease in the number of sucks or complete cessation of sucking.   Falling asleep.   Relaxation of his or her body.   Retention of a small amount of milk in his or her mouth.   Letting go of your breast by himself or herself. Signs from you:  Breasts that have increased in firmness, weight, and size 1-3 hours after feeding.   Breasts that are softer immediately after breastfeeding.  Increased milk volume, as well as a change in milk consistency and color by the fifth day of breastfeeding.   Nipples that are not sore, cracked, or bleeding. Signs That Your Baby is Getting Enough Milk  Wetting at least 3 diapers in a 24-hour period. The urine should be clear and pale yellow by age 5 days.  At least 3 stools in a 24-hour period by age 5 days. The stool should be soft and yellow.  At least 3 stools in a 24-hour period by age 7 days. The stool should be seedy and yellow.  No loss of weight greater than 10% of birth weight  during the first 3   days of age.  Average weight gain of 4-7 ounces (113-198 g) per week after age 4 days.  Consistent daily weight gain by age 5 days, without weight loss after the age of 2 weeks. After a feeding, your baby may spit up a small amount. This is common. BREASTFEEDING FREQUENCY AND DURATION Frequent feeding will help you make more milk and can prevent sore nipples and breast engorgement. Breastfeed when you feel the need to reduce the fullness of your breasts or when your baby shows signs of hunger. This is called "breastfeeding on demand." Avoid introducing a pacifier to your baby while you are working to establish breastfeeding (the first 4-6 weeks after your baby is born). After this time you may choose to use a pacifier. Research has shown that pacifier use during the first year of a baby's life decreases the risk of sudden infant death syndrome (SIDS). Allow your baby to feed on each breast as long as he or she wants. Breastfeed until your baby is finished feeding. When your baby unlatches or falls asleep while feeding from the first breast, offer the second breast. Because newborns are often sleepy in the first few weeks of life, you may need to awaken your baby to get him or her to feed. Breastfeeding times will vary from baby to baby. However, the following rules can serve as a guide to help you ensure that your baby is properly fed:  Newborns (babies 4 weeks of age or younger) may breastfeed every 1-3 hours.  Newborns should not go longer than 3 hours during the day or 5 hours during the night without breastfeeding.  You should breastfeed your baby a minimum of 8 times in a 24-hour period until you begin to introduce solid foods to your baby at around 6 months of age. BREAST MILK PUMPING Pumping and storing breast milk allows you to ensure that your baby is exclusively fed your breast milk, even at times when you are unable to breastfeed. This is especially important if you are  going back to work while you are still breastfeeding or when you are not able to be present during feedings. Your lactation consultant can give you guidelines on how long it is safe to store breast milk.  A breast pump is a machine that allows you to pump milk from your breast into a sterile bottle. The pumped breast milk can then be stored in a refrigerator or freezer. Some breast pumps are operated by hand, while others use electricity. Ask your lactation consultant which type will work best for you. Breast pumps can be purchased, but some hospitals and breastfeeding support groups lease breast pumps on a monthly basis. A lactation consultant can teach you how to hand express breast milk, if you prefer not to use a pump.  CARING FOR YOUR BREASTS WHILE YOU BREASTFEED Nipples can become dry, cracked, and sore while breastfeeding. The following recommendations can help keep your breasts moisturized and healthy:  Avoid using soap on your nipples.   Wear a supportive bra. Although not required, special nursing bras and tank tops are designed to allow access to your breasts for breastfeeding without taking off your entire bra or top. Avoid wearing underwire-style bras or extremely tight bras.  Air dry your nipples for 3-4minutes after each feeding.   Use only cotton bra pads to absorb leaked breast milk. Leaking of breast milk between feedings is normal.   Use lanolin on your nipples after breastfeeding. Lanolin helps to maintain your skin's   normal moisture barrier. If you use pure lanolin, you do not need to wash it off before feeding your baby again. Pure lanolin is not toxic to your baby. You may also hand express a few drops of breast milk and gently massage that milk into your nipples and allow the milk to air dry. In the first few weeks after giving birth, some women experience extremely full breasts (engorgement). Engorgement can make your breasts feel heavy, warm, and tender to the touch.  Engorgement peaks within 3-5 days after you give birth. The following recommendations can help ease engorgement:  Completely empty your breasts while breastfeeding or pumping. You may want to start by applying warm, moist heat (in the shower or with warm water-soaked hand towels) just before feeding or pumping. This increases circulation and helps the milk flow. If your baby does not completely empty your breasts while breastfeeding, pump any extra milk after he or she is finished.  Wear a snug bra (nursing or regular) or tank top for 1-2 days to signal your body to slightly decrease milk production.  Apply ice packs to your breasts, unless this is too uncomfortable for you.  Make sure that your baby is latched on and positioned properly while breastfeeding. If engorgement persists after 48 hours of following these recommendations, contact your health care provider or a lactation consultant. OVERALL HEALTH CARE RECOMMENDATIONS WHILE BREASTFEEDING  Eat healthy foods. Alternate between meals and snacks, eating 3 of each per day. Because what you eat affects your breast milk, some of the foods may make your baby more irritable than usual. Avoid eating these foods if you are sure that they are negatively affecting your baby.  Drink milk, fruit juice, and water to satisfy your thirst (about 10 glasses a day).   Rest often, relax, and continue to take your prenatal vitamins to prevent fatigue, stress, and anemia.  Continue breast self-awareness checks.  Avoid chewing and smoking tobacco.  Avoid alcohol and drug use. Some medicines that may be harmful to your baby can pass through breast milk. It is important to ask your health care provider before taking any medicine, including all over-the-counter and prescription medicine as well as vitamin and herbal supplements. It is possible to become pregnant while breastfeeding. If birth control is desired, ask your health care provider about options that  will be safe for your baby. SEEK MEDICAL CARE IF:   You feel like you want to stop breastfeeding or have become frustrated with breastfeeding.  You have painful breasts or nipples.  Your nipples are cracked or bleeding.  Your breasts are red, tender, or warm.  You have a swollen area on either breast.  You have a fever or chills.  You have nausea or vomiting.  You have drainage other than breast milk from your nipples.  Your breasts do not become full before feedings by the fifth day after you give birth.  You feel sad and depressed.  Your baby is too sleepy to eat well.  Your baby is having trouble sleeping.   Your baby is wetting less than 3 diapers in a 24-hour period.  Your baby has less than 3 stools in a 24-hour period.  Your baby's skin or the white part of his or her eyes becomes yellow.   Your baby is not gaining weight by 5 days of age. SEEK IMMEDIATE MEDICAL CARE IF:   Your baby is overly tired (lethargic) and does not want to wake up and feed.  Your baby   develops an unexplained fever. Document Released: 11/11/2005 Document Revised: 11/16/2013 Document Reviewed: 05/05/2013 [employer] Patient Information 2015 [employer]. This information is not intended to replace advice given to you by your health care provider. Make sure you discuss any questions you have with your health care provider.  

## 2015-06-05 NOTE — Progress Notes (Signed)
Subjective:  Kim Johns is a 24 y.o. G2P0100 at [redacted]w[redacted]d being seen today for ongoing prenatal care.  Patient reports vaginal irritation.  Contractions: Not present.  Vag. Bleeding: None. Movement: Present. Denies leaking of fluid.   The following portions of the patient's history were reviewed and updated as appropriate: allergies, current medications, past family history, past medical history, past social history, past surgical history and problem list.   Objective:   Filed Vitals:   06/05/15 1107  BP: 133/59  Pulse: 81  Temp: 98.5 F (36.9 C)  Weight: 221 lb 12.8 oz (100.608 kg)    Fetal Status: Fetal Heart Rate (bpm): 150   Movement: Present     General:  Alert, oriented and cooperative. Patient is in no acute distress.  Skin: Skin is warm and dry. No rash noted.   Cardiovascular: Normal heart rate noted  Respiratory: Normal respiratory effort, no problems with respiration noted  Abdomen: Soft, gravid, appropriate for gestational age. Pain/Pressure: Absent     Vaginal: Vag. Bleeding: None.    Vag D/C Character: White  Cervix: Exam revealed ftp/50/ballotable        Extremities: Normal range of motion.  Edema: None  Mental Status: Normal mood and affect. Normal behavior. Normal judgment and thought content.   Urinalysis: Urine Protein: Negative Urine Glucose: Negative  Assessment and Plan:  Pregnancy: G2P0100 at [redacted]w[redacted]d  1. History of preterm delivery Will stop prometrium, continue 17 P  2. Supervision of high risk pregnancy in first trimester Continue routine prenatal care.   3. Cervical insufficiency in pregnancy, antepartum, third trimester S/p cerclage   5. Polyuria  - Culture, OB Urine - Wet prep, genital   Preterm labor symptoms and general obstetric precautions including but not limited to vaginal bleeding, contractions, leaking of fluid and fetal movement were reviewed in detail with the patient.  Please refer to After Visit Summary for other counseling  recommendations.   Return in 1 week (on 06/12/2015).   Donnamae Jude, MD

## 2015-06-07 LAB — CULTURE, OB URINE: Colony Count: 80000

## 2015-06-12 ENCOUNTER — Ambulatory Visit (INDEPENDENT_AMBULATORY_CARE_PROVIDER_SITE_OTHER): Payer: Medicaid Other

## 2015-06-12 ENCOUNTER — Telehealth: Payer: Self-pay | Admitting: General Practice

## 2015-06-12 VITALS — BP 129/72 | HR 93 | Wt 223.1 lb

## 2015-06-12 DIAGNOSIS — O09213 Supervision of pregnancy with history of pre-term labor, third trimester: Secondary | ICD-10-CM | POA: Diagnosis not present

## 2015-06-12 DIAGNOSIS — O42919 Preterm premature rupture of membranes, unspecified as to length of time between rupture and onset of labor, unspecified trimester: Secondary | ICD-10-CM

## 2015-06-12 NOTE — Telephone Encounter (Signed)
Patient presents to clinic to advise referral coordinator that she reached out to Dermatologist in [facility] and they are able to see patient but she needs to have referral sent.  Please send referral for patient.

## 2015-06-12 NOTE — Telephone Encounter (Signed)
Pt has an appointment  08-15-15 @ 1:45pm [facility] ph. # [phone] Address [address], [place]  [address] . Pt will receive a new patient package with appointment details . Lvm with appointment details  Thank You

## 2015-06-13 ENCOUNTER — Telehealth: Payer: Self-pay | Admitting: *Deleted

## 2015-06-13 NOTE — Telephone Encounter (Signed)
Discussed result of urine culture with Dr. Nehemiah Settle- lactobacillus does not require treatment. Called [name] and notified her she does not have a uti. We discussed symptoms she is having which she described as frequent urination, but not burning or pain with urination. And cramping/ or achiness in her hips which we discussed may be ligament pain. She denies contractions.

## 2015-06-13 NOTE — Telephone Encounter (Signed)
Called and left message wanting results of urine test from last week checking on uti. States she feels crampy and wondered if she has one.

## 2015-06-18 ENCOUNTER — Encounter (HOSPITAL_COMMUNITY): Payer: Self-pay

## 2015-06-18 ENCOUNTER — Inpatient Hospital Stay (HOSPITAL_COMMUNITY)
Admission: AD | Admit: 2015-06-18 | Discharge: 2015-06-18 | Disposition: A | Discharge status: Home / Self Care | Payer: Medicaid Other | Source: Ambulatory Visit | Attending: Obstetrics and Gynecology | Admitting: Obstetrics and Gynecology

## 2015-06-18 DIAGNOSIS — Z87891 Personal history of nicotine dependence: Secondary | ICD-10-CM | POA: Insufficient documentation

## 2015-06-18 DIAGNOSIS — Z3A31 31 weeks gestation of pregnancy: Secondary | ICD-10-CM | POA: Diagnosis not present

## 2015-06-18 DIAGNOSIS — O368130 Decreased fetal movements, third trimester, not applicable or unspecified: Secondary | ICD-10-CM | POA: Diagnosis not present

## 2015-06-18 NOTE — MAU Note (Signed)
Pt states decreased fetal movement since this am, however felt mvmt in lobby. No bleeding or lof.

## 2015-06-18 NOTE — MAU Provider Note (Signed)
  History   G2P0 at 31.4 wks in with c/o decreased fetal movement since this morning but states she started feeling movement while waiting in the waiting area prior to admit.  CSN: [id number]  Arrival date and time: 06/18/15 1353   None     Chief Complaint  Patient presents with  . Decreased Fetal Movement   HPI  OB History    Gravida Para Term Preterm AB TAB SAB Ectopic Multiple Living   2 1 0 1 0 0 0 0 0 0       Past Medical History  Diagnosis Date  . Allergy   . Asthma     albuterol last used a few months ago  . Preterm labor     Past Surgical History  Procedure Laterality Date  . Wisdom tooth extraction    . Cervical cerclage N/A 04/05/2015    Procedure: CERCLAGE CERVICAL;  Surgeon: Lavonia Drafts, MD;  Location: [facility];  Service: Gynecology;  Laterality: N/A;    Family History  Problem Relation Age of Onset  . Diabetes Mother   . Stroke Mother   . Hypertension Father     History  Substance Use Topics  . Smoking status: Former Smoker -- 0.50 packs/day for 10 years    Types: Cigarettes  . Smokeless tobacco: Never Used  . Alcohol Use: No    Allergies:  Allergies  Allergen Reactions  . Shellfish Allergy Hives  . Sulfa Antibiotics Other (See Comments)    Reaction:  Unknown; childhood reaction  . Icy Hot Rash    Facility-administered medications prior to admission  Medication Dose Route Frequency Provider Last Rate Last Dose  . hydroxyprogesterone caproate (DELALUTIN) 250 mg/mL injection 250 mg  250 mg Intramuscular Weekly Nila Nephew, MD   250 mg at 06/12/15 1137   Prescriptions prior to admission  Medication Sig Dispense Refill Last Dose  . Prenatal Vit-Fe Fumarate-FA (PRENATAL MULTIVITAMIN) TABS tablet Take 1 tablet by mouth daily.   06/18/2015 at Unknown time    Review of Systems  Constitutional: Negative.   HENT: Negative.   Eyes: Negative.   Respiratory: Negative.   Cardiovascular: Negative.   Gastrointestinal: Negative.    Genitourinary: Negative.   Musculoskeletal: Negative.   Skin: Negative.   Neurological: Negative.   Endo/Heme/Allergies: Negative.   Psychiatric/Behavioral: Negative.    Physical Exam   Blood pressure 134/82, pulse 105, temperature 98 F (36.7 C), temperature source Oral, resp. rate 18, last menstrual period 11/09/2014.  Physical Exam  Constitutional: She is oriented to person, place, and time. She appears well-developed and well-nourished.  HENT:  Head: Normocephalic.  Eyes: Pupils are equal, round, and reactive to light.  Neck: Normal range of motion.  Cardiovascular: Normal rate, regular rhythm, normal heart sounds and intact distal pulses.   Respiratory: Effort normal and breath sounds normal.  GI: Soft. Bowel sounds are normal.  Genitourinary: Vagina normal and uterus normal.  Musculoskeletal: Normal range of motion.  Neurological: She is alert and oriented to person, place, and time. She has normal reflexes.  Skin: Skin is warm and dry.  Psychiatric: She has a normal mood and affect. Her behavior is normal. Judgment and thought content normal.    MAU Course  Procedures  MDM Reactive FHR pattern  Assessment and Plan  Reactive FHR pattern will d/c home with kick counts  Kim Johns Kim Johns 06/18/2015, 2:33 PM

## 2015-06-18 NOTE — Discharge Instructions (Signed)

## 2015-06-19 ENCOUNTER — Ambulatory Visit (INDEPENDENT_AMBULATORY_CARE_PROVIDER_SITE_OTHER): Payer: Medicaid Other | Admitting: Obstetrics and Gynecology

## 2015-06-19 VITALS — BP 122/69 | HR 101 | Temp 98.3°F | Wt 224.1 lb

## 2015-06-19 DIAGNOSIS — O0991 Supervision of high risk pregnancy, unspecified, first trimester: Secondary | ICD-10-CM | POA: Diagnosis not present

## 2015-06-19 DIAGNOSIS — O3433 Maternal care for cervical incompetence, third trimester: Secondary | ICD-10-CM

## 2015-06-19 DIAGNOSIS — O1213 Gestational proteinuria, third trimester: Secondary | ICD-10-CM

## 2015-06-19 DIAGNOSIS — O3443 Maternal care for other abnormalities of cervix, third trimester: Secondary | ICD-10-CM | POA: Diagnosis not present

## 2015-06-19 DIAGNOSIS — Z23 Encounter for immunization: Secondary | ICD-10-CM | POA: Diagnosis not present

## 2015-06-19 LAB — POCT URINALYSIS DIP (DEVICE)
GLUCOSE, UA: NEGATIVE mg/dL
Hgb urine dipstick: NEGATIVE
KETONES UR: NEGATIVE mg/dL
NITRITE: NEGATIVE
Protein, ur: 100 mg/dL — AB
Urobilinogen, UA: 1 mg/dL (ref 0.0–1.0)
pH: 6.5 (ref 5.0–8.0)

## 2015-06-19 NOTE — Patient Instructions (Signed)
Etonogestrel implant What is this medicine? ETONOGESTREL (et oh noe JES trel) is a contraceptive (birth control) device. It is used to prevent pregnancy. It can be used for up to 3 years. This medicine may be used for other purposes; ask your health care provider or pharmacist if you have questions. COMMON BRAND NAME(S): Implanon, Nexplanon What should I tell my health care provider before I take this medicine? They need to know if you have any of these conditions: -abnormal vaginal bleeding -blood vessel disease or blood clots -cancer of the breast, cervix, or liver -depression -diabetes -gallbladder disease -headaches -heart disease or recent heart attack -high blood pressure -high cholesterol -kidney disease -liver disease -renal disease -seizures -tobacco smoker -an unusual or allergic reaction to etonogestrel, other hormones, anesthetics or antiseptics, medicines, foods, dyes, or preservatives -pregnant or trying to get pregnant -breast-feeding How should I use this medicine? This device is inserted just under the skin on the inner side of your upper arm by a health care professional. Talk to your pediatrician regarding the use of this medicine in children. Special care may be needed. Overdosage: If you think you've taken too much of this medicine contact a poison control center or emergency room at once. Overdosage: If you think you have taken too much of this medicine contact a poison control center or emergency room at once. NOTE: This medicine is only for you. Do not share this medicine with others. What if I miss a dose? This does not apply. What may interact with this medicine? Do not take this medicine with any of the following medications: -amprenavir -bosentan -fosamprenavir This medicine may also interact with the following medications: -barbiturate medicines for inducing sleep or treating seizures -certain medicines for fungal infections like ketoconazole and  itraconazole -griseofulvin -medicines to treat seizures like carbamazepine, felbamate, oxcarbazepine, phenytoin, topiramate -modafinil -phenylbutazone -rifampin -some medicines to treat HIV infection like atazanavir, indinavir, lopinavir, nelfinavir, tipranavir, ritonavir -St. John's wort This list may not describe all possible interactions. Give your health care provider a list of all the medicines, herbs, non-prescription drugs, or dietary supplements you use. Also tell them if you smoke, drink alcohol, or use illegal drugs. Some items may interact with your medicine. What should I watch for while using this medicine? This product does not protect you against HIV infection (AIDS) or other sexually transmitted diseases. You should be able to feel the implant by pressing your fingertips over the skin where it was inserted. Tell your doctor if you cannot feel the implant. What side effects may I notice from receiving this medicine? Side effects that you should report to your doctor or health care professional as soon as possible: -allergic reactions like skin rash, itching or hives, swelling of the face, lips, or tongue -breast lumps -changes in vision -confusion, trouble speaking or understanding -dark urine -depressed mood -general ill feeling or flu-like symptoms -light-colored stools -loss of appetite, nausea -right upper belly pain -severe headaches -severe pain, swelling, or tenderness in the abdomen -shortness of breath, chest pain, swelling in a leg -signs of pregnancy -sudden numbness or weakness of the face, arm or leg -trouble walking, dizziness, loss of balance or coordination -unusual vaginal bleeding, discharge -unusually weak or tired -yellowing of the eyes or skin Side effects that usually do not require medical attention (Report these to your doctor or health care professional if they continue or are bothersome.): -acne -breast pain -changes in  weight -cough -fever or chills -headache -irregular menstrual bleeding -itching, burning, and   vaginal discharge -pain or difficulty passing urine -sore throat This list may not describe all possible side effects. Call your doctor for medical advice about side effects. You may report side effects to FDA at 1-800-FDA-1088. Where should I keep my medicine? This drug is given in a hospital or clinic and will not be stored at home. NOTE: This sheet is a summary. It may not cover all possible information. If you have questions about this medicine, talk to your doctor, pharmacist, or health care provider.  2015, Elsevier/Gold Standard. (2012-05-18 15:37:45)  

## 2015-06-19 NOTE — Progress Notes (Signed)
Subjective:  Kim Johns is a 24 y.o. G2P0100 at [redacted]w[redacted]d being seen today for ongoing prenatal care.  Patient reports no complaints.  Contractions: Not present.  Vag. Bleeding: None. Movement: Present. Denies leaking of fluid.   The following portions of the patient's history were reviewed and updated as appropriate: allergies, current medications, past family history, past medical history, past social history, past surgical history and problem list.   Objective:   Filed Vitals:   06/19/15 1052  BP: 122/69  Pulse: 101  Temp: 98.3 F (36.8 C)    Fetal Status: Fetal Heart Rate (bpm): 144   Movement: Present     General:  Alert, oriented and cooperative. Patient is in no acute distress.  Skin: Skin is warm and dry. No rash noted.   Cardiovascular: Normal heart rate noted  Respiratory: Normal respiratory effort, no problems with respiration noted  Abdomen: Soft, gravid, appropriate for gestational age. Pain/Pressure: Absent     Vaginal: Vag. Bleeding: None.       Cervix: Not evaluated        Extremities: Normal range of motion.  Edema: Trace  Mental Status: Normal mood and affect. Normal behavior. Normal judgment and thought content.   Urinalysis: Urine Protein: 2+ Urine Glucose: Negative  Assessment and Plan:  Pregnancy: G2P0100 at [redacted]w[redacted]d  1. Proteinuria affecting pregnancy in third trimester C&S sent  2. Cervical insufficiency in pregnancy, antepartum, third trimester No PTL sx   3. Supervision of high risk pregnancy in first trimester [place] for growth in 1 wk  Preterm labor symptoms and general obstetric precautions including but not limited to vaginal bleeding, contractions, leaking of fluid and fetal movement were reviewed in detail with the patient. Please refer to After Visit Summary for other counseling recommendations.  No Follow-up on file.   Lorene Dy, CNM

## 2015-06-21 ENCOUNTER — Telehealth: Payer: Self-pay | Admitting: General Practice

## 2015-06-21 NOTE — Telephone Encounter (Addendum)
Patient called in and left message on nurse line stating her urine was dumped out on Monday and it was never sent for culture. Patient states she is coming by tomorrow to drop off a sample and if the urine initially looks concerning can we just go ahead and treat her urine so she isn't waiting days to get the results back without treatment. Called patient, no answer- left message stating we are trying to reach you to return your phone call, please call us back at the clinics  7/27  1530  Pt called back and I spoke with her. I advised that when she comes in tomorrow she should wait for her urine to be tested. If the results are indicative of UTI, she will receive a prescription for antibiotic. It is possible that once the culture is completed, she may need a different antibiotic than what she is first given. Pt voiced understanding.  Diane Day RNC

## 2015-06-21 NOTE — Telephone Encounter (Signed)
Patient called and left message requesting test results. Per review, order was placed in afternoon clinic and patient's urine had already been dumped out from morning. Discussed with patient and offered her to come in today or tomorrow to drop off urine sample. Patient states she will come tomorrow. Patient had no questions

## 2015-06-22 ENCOUNTER — Ambulatory Visit (INDEPENDENT_AMBULATORY_CARE_PROVIDER_SITE_OTHER): Payer: Medicaid Other | Admitting: *Deleted

## 2015-06-22 DIAGNOSIS — R350 Frequency of micturition: Secondary | ICD-10-CM

## 2015-06-22 LAB — POCT URINALYSIS DIP (DEVICE)
Bilirubin Urine: NEGATIVE
Glucose, UA: NEGATIVE mg/dL
HGB URINE DIPSTICK: NEGATIVE
Ketones, ur: NEGATIVE mg/dL
Nitrite: NEGATIVE
Protein, ur: 100 mg/dL — AB
Specific Gravity, Urine: 1.02 (ref 1.005–1.030)
UROBILINOGEN UA: 1 mg/dL (ref 0.0–1.0)
pH: 7 (ref 5.0–8.0)

## 2015-06-22 NOTE — Progress Notes (Signed)
Pt provided urine for urine culture, urinalysis done, sent for culture.  Pt denies any symptoms for uti.

## 2015-06-23 LAB — CULTURE, OB URINE
COLONY COUNT: NO GROWTH
Organism ID, Bacteria: NO GROWTH

## 2015-06-26 ENCOUNTER — Ambulatory Visit (HOSPITAL_COMMUNITY)
Admission: RE | Admit: 2015-06-26 | Discharge: 2015-06-26 | Disposition: A | Discharge status: Home / Self Care | Payer: Medicaid Other | Source: Ambulatory Visit | Attending: Obstetrics and Gynecology | Admitting: Obstetrics and Gynecology

## 2015-06-26 ENCOUNTER — Ambulatory Visit: Payer: Medicaid Other | Admitting: Internal Medicine

## 2015-06-26 ENCOUNTER — Ambulatory Visit: Payer: Medicaid Other

## 2015-06-26 ENCOUNTER — Encounter (HOSPITAL_COMMUNITY): Payer: Self-pay

## 2015-06-26 DIAGNOSIS — Z3A00 Weeks of gestation of pregnancy not specified: Secondary | ICD-10-CM | POA: Diagnosis not present

## 2015-06-26 DIAGNOSIS — Z3A32 32 weeks gestation of pregnancy: Secondary | ICD-10-CM | POA: Insufficient documentation

## 2015-06-26 DIAGNOSIS — O09212 Supervision of pregnancy with history of pre-term labor, second trimester: Secondary | ICD-10-CM | POA: Diagnosis not present

## 2015-06-26 DIAGNOSIS — O26872 Cervical shortening, second trimester: Secondary | ICD-10-CM | POA: Diagnosis present

## 2015-06-26 DIAGNOSIS — Z8751 Personal history of pre-term labor: Secondary | ICD-10-CM | POA: Insufficient documentation

## 2015-06-29 ENCOUNTER — Telehealth: Payer: Self-pay | Admitting: *Deleted

## 2015-06-29 NOTE — Telephone Encounter (Signed)
Patient called to inquire if she has ever been tested for HSV, I advised patient that according to her chart she has not. Patient requesting to have blood test for HSV-2 at the next visit. Advised patient to discuss this with her next visit provider and that I will put a note in her chart.

## 2015-06-30 ENCOUNTER — Telehealth: Payer: Self-pay | Admitting: *Deleted

## 2015-06-30 NOTE — Telephone Encounter (Signed)
Kim Johns called this am and left a voice message stating she is really congested and wonders if it is a sinus infection and  wanted to know if there is any otc meds safe to take in pregnancy.     Called Kim Johns and she states she has nasal congestion but nothing comes out when she blows her nose, states her ear hurts and some headache.  Denies fever. Per chart review and discussion with Kim Johns she has not had any issues with blood pressure.  Informed her she may take otc Tylenol sinus or tylenol allergy- to read bottle and take whichever best matches  Her symtoms and to follow package instructions.  Also advised to check all labels and make sure does not list phenylephrine.  Also informed or she could take mucinex , robitussin. Instructed her if she gets a fever , or gets worse to go to MAU . She voices understanding.

## 2015-07-01 ENCOUNTER — Inpatient Hospital Stay (HOSPITAL_COMMUNITY)
Admission: AD | Admit: 2015-07-01 | Discharge: 2015-07-01 | Disposition: A | Discharge status: Home / Self Care | Payer: Medicaid Other | Source: Ambulatory Visit | Attending: Obstetrics & Gynecology | Admitting: Obstetrics & Gynecology

## 2015-07-01 ENCOUNTER — Encounter (HOSPITAL_COMMUNITY): Payer: Self-pay

## 2015-07-01 DIAGNOSIS — Z3A33 33 weeks gestation of pregnancy: Secondary | ICD-10-CM | POA: Insufficient documentation

## 2015-07-01 DIAGNOSIS — J4521 Mild intermittent asthma with (acute) exacerbation: Secondary | ICD-10-CM

## 2015-07-01 DIAGNOSIS — Z87891 Personal history of nicotine dependence: Secondary | ICD-10-CM | POA: Diagnosis not present

## 2015-07-01 DIAGNOSIS — J069 Acute upper respiratory infection, unspecified: Secondary | ICD-10-CM | POA: Diagnosis not present

## 2015-07-01 DIAGNOSIS — O99513 Diseases of the respiratory system complicating pregnancy, third trimester: Secondary | ICD-10-CM | POA: Diagnosis not present

## 2015-07-01 DIAGNOSIS — J45909 Unspecified asthma, uncomplicated: Secondary | ICD-10-CM | POA: Diagnosis present

## 2015-07-01 MED ORDER — SALINE SPRAY 0.65 % NA SOLN: 1.0000 | NASAL | Status: DC | PRN

## 2015-07-01 MED ORDER — AEROCHAMBER PLUS MISC: Status: DC

## 2015-07-01 MED ORDER — ALBUTEROL SULFATE HFA 108 (90 BASE) MCG/ACT IN AERS: 2.0000 | INHALATION_SPRAY | Freq: Four times a day (QID) | RESPIRATORY_TRACT | Status: DC | PRN

## 2015-07-01 NOTE — MAU Provider Note (Signed)
MAU HISTORY AND PHYSICAL  Chief Complaint:  Asthma   Kim Johns is a 24 y.o.  G2P0100 with IUP at [redacted]w[redacted]d presenting for Asthma  For the past 1-2 days the patient reports nasal congestion, dry cough, and subjective shortness of breath. No eye or ear pain, no throat pain, no chest pain, no productive cough, no wheezing, no sick contacts, no fever or chills, no nausea or vomiting, no leg swelling or recent immobilization. Feeling baby move, no bleeding, no leakage of fluid. Hasn't tried anything for her symptoms. Most bothered by nasal congestion. Has hx of asthma but has no asthma meds.  Menstrual History: OB History    Gravida Para Term Preterm AB TAB SAB Ectopic Multiple Living   2 1 0 1 0 0 0 0 0 0      Patient's last menstrual period was 11/09/2014.      Past Medical History  Diagnosis Date  . Allergy   . Asthma     albuterol last used a few months ago  . Preterm labor     Past Surgical History  Procedure Laterality Date  . Wisdom tooth extraction    . Cervical cerclage N/A 04/05/2015    Procedure: CERCLAGE CERVICAL;  Surgeon: Lavonia Drafts, MD;  Location: [facility];  Service: Gynecology;  Laterality: N/A;    Family History  Problem Relation Age of Onset  . Diabetes Mother   . Stroke Mother   . Hypertension Father     History  Substance Use Topics  . Smoking status: Former Smoker -- 0.50 packs/day for 10 years    Types: Cigarettes  . Smokeless tobacco: Never Used  . Alcohol Use: No      Allergies  Allergen Reactions  . Shellfish Allergy Hives  . Sulfa Antibiotics Other (See Comments)    Reaction:  Unknown; childhood reaction  . Icy Hot Rash    Facility-administered medications prior to admission  Medication Dose Route Frequency Provider Last Rate Last Dose  . hydroxyprogesterone caproate (DELALUTIN) 250 mg/mL injection 250 mg  250 mg Intramuscular Weekly Nila Nephew, MD   250 mg at 06/19/15 1109   Prescriptions prior to admission  Medication  Sig Dispense Refill Last Dose  . loratadine (CLARITIN) 10 MG tablet Take 10 mg by mouth daily as needed for allergies.   07/01/2015 at Unknown time  . Prenatal Vit-Fe Fumarate-FA (PRENATAL MULTIVITAMIN) TABS tablet Take 1 tablet by mouth daily.   07/01/2015 at Unknown time  . Pseudoephedrine HCl (SUDAFED) 30 MG/5ML LIQD Take 15 mg by mouth every 4 (four) hours.   07/01/2015 at Unknown time    Review of Systems - Negative except for what is mentioned in HPI.  Physical Exam  Blood pressure 125/80, pulse 121, temperature 97.8 F (36.6 C), temperature source Oral, resp. rate 16, last menstrual period 11/09/2014, SpO2 100 %. GENERAL: Well-developed, well-nourished female in no acute distress.  HEENT: pharynx clear LUNGS: Clear to auscultation bilaterally.  HEART: Regular rate and rhythm. ABDOMEN: Soft, nontender, nondistended, gravid.  EXTREMITIES: Nontender, no edema, 2+ distal pulses. Cervical Exam: deferred FHT:  140/mod/+a/-d Contractions: none   Labs: No results found for this or any previous visit (from the past 24 hour(s)).  Imaging Studies:  US Ob Follow Up  06/26/2015   OBSTETRICAL ULTRASOUND: This exam was performed within a [facility]. The OB US report was generated in the AS system, and faxed to the ordering physician.   This report is available in the [facility]. See the AS  Obstetric US report via the Image Link.   Assessment: Kim Johns is  24 y.o. G2P0100 at [redacted]w[redacted]d presents with upper respiratory infection (likely viral) and possible mild asthma exacerbation. Normal WOB, no hypoxia, no signs DVT. Low concern for pneumonia. No wheezing on exam, but could represent cough-variant asthma. No signs of PTL or PPROM; reactive NST.  Plan: - nasal saline spray for nasal congestion - albuterol mdi inhaler with spacer for possible asthma exacerbation - HROB f/u next week if clinical worsening or no improvement in symptoms  Desma Maxim 8/6/20164:51 PM

## 2015-07-01 NOTE — Discharge Instructions (Signed)

## 2015-07-01 NOTE — MAU Note (Signed)
Pt states has not had an inhaler since last pregnancy, and did not have refills.

## 2015-07-01 NOTE — MAU Note (Signed)
Pt states here for nasal congestion, upper respiratory congestion. Has taken sudaphed and claritin with no relief.

## 2015-07-03 ENCOUNTER — Ambulatory Visit (INDEPENDENT_AMBULATORY_CARE_PROVIDER_SITE_OTHER): Payer: Medicaid Other | Admitting: Obstetrics and Gynecology

## 2015-07-03 VITALS — BP 128/70 | HR 97 | Temp 98.5°F | Wt 227.1 lb

## 2015-07-03 DIAGNOSIS — O0991 Supervision of high risk pregnancy, unspecified, first trimester: Secondary | ICD-10-CM | POA: Diagnosis not present

## 2015-07-03 DIAGNOSIS — Z8751 Personal history of pre-term labor: Secondary | ICD-10-CM

## 2015-07-03 DIAGNOSIS — O3433 Maternal care for cervical incompetence, third trimester: Secondary | ICD-10-CM

## 2015-07-03 DIAGNOSIS — O3443 Maternal care for other abnormalities of cervix, third trimester: Secondary | ICD-10-CM | POA: Diagnosis not present

## 2015-07-03 LAB — POCT URINALYSIS DIP (DEVICE)
BILIRUBIN URINE: NEGATIVE
Glucose, UA: NEGATIVE mg/dL
HGB URINE DIPSTICK: NEGATIVE
Ketones, ur: NEGATIVE mg/dL
NITRITE: NEGATIVE
PH: 7 (ref 5.0–8.0)
Protein, ur: 30 mg/dL — AB
SPECIFIC GRAVITY, URINE: 1.02 (ref 1.005–1.030)
Urobilinogen, UA: 0.2 mg/dL (ref 0.0–1.0)

## 2015-07-03 NOTE — Progress Notes (Signed)
#   Bump on labia minora - normal exam, patient reassured

## 2015-07-03 NOTE — Progress Notes (Signed)
Patient states she does not wish to receive 17p anymore  Reviewed tip of week with patient

## 2015-07-03 NOTE — Progress Notes (Signed)
Subjective:  Kim Johns is a 24 y.o. G2P0100 at [redacted]w[redacted]d being seen today for ongoing prenatal care.  Patient reports no complaints. Felt a bump on her labia last week, wonders if it is herpes. Not currenlty pregnant, no hx hsv, no known exposure to such. Contractions: Not present.  Vag. Bleeding: None. Movement: Present. Denies leaking of fluid.   The following portions of the patient's history were reviewed and updated as appropriate: allergies, current medications, past family history, past medical history, past social history, past surgical history and problem list.   Objective:   Filed Vitals:   07/03/15 1112  BP: 128/70  Pulse: 97  Temp: 98.5 F (36.9 C)  Weight: 227 lb 1.6 oz (103.012 kg)    Fetal Status: Fetal Heart Rate (bpm): 147 Fundal Height: 38 cm Movement: Present     General:  Alert, oriented and cooperative. Patient is in no acute distress.  Skin: Skin is warm and dry. No rash noted.   Cardiovascular: Normal heart rate noted  Respiratory: Normal respiratory effort, no problems with respiration noted  Abdomen: Soft, gravid, appropriate for gestational age. Pain/Pressure: Absent     Pelvic: Normal vulva, no lesions  Extremities: Normal range of motion.  Edema: None  Mental Status: Normal mood and affect. Normal behavior. Normal judgment and thought content.   Urinalysis: Urine Protein: 1+ Urine Glucose: Negative  Assessment and Plan:  Pregnancy: G2P0100 at [redacted]w[redacted]d  1. Supervision of high risk pregnancy in first trimester - continue pnv   2. History of preterm delivery - has decided to forgo vaginal and IM progesterone - cerclage in place, remove @ 36-37  3. Size > dates - normal growth on u/s one week ago; will continue to monitor  Preterm labor symptoms and general obstetric precautions including but not limited to vaginal bleeding, contractions, leaking of fluid and fetal movement were reviewed in detail with the patient. Please refer to After Visit Summary  for other counseling recommendations.  Return in about 2 weeks (around 07/17/2015).   Gwynne Edinger, MD

## 2015-07-06 ENCOUNTER — Encounter (HOSPITAL_COMMUNITY): Payer: Self-pay | Admitting: Emergency Medicine

## 2015-07-06 ENCOUNTER — Emergency Department (HOSPITAL_COMMUNITY)
Admission: EM | Admit: 2015-07-06 | Discharge: 2015-07-06 | Disposition: A | Discharge status: Home / Self Care | Payer: Medicaid Other | Source: Home / Self Care | Attending: Family Medicine | Admitting: Family Medicine

## 2015-07-06 DIAGNOSIS — H6591 Unspecified nonsuppurative otitis media, right ear: Secondary | ICD-10-CM

## 2015-07-06 MED ORDER — AMOXICILLIN 500 MG PO CAPS: 1000.0000 mg | ORAL_CAPSULE | Freq: Two times a day (BID) | ORAL | Status: DC

## 2015-07-06 NOTE — ED Notes (Signed)
C/o right ear infection States ear has been clogged for four days  No tx tried

## 2015-07-06 NOTE — Discharge Instructions (Signed)
Otitis Media °Otitis media is redness, soreness, and puffiness (swelling) in the space just behind your eardrum (middle ear). It may be caused by allergies or infection. It often happens along with a cold. °HOME CARE °· Take your medicine as told. Finish it even if you start to feel better. °· Only take over-the-counter or prescription medicines for pain, discomfort, or fever as told by your doctor. °· Follow up with your doctor as told. °GET HELP IF: °· You have otitis media only in one ear, or bleeding from your nose, or both. °· You notice a lump on your neck. °· You are not getting better in 3-5 days. °· You feel worse instead of better. °GET HELP RIGHT AWAY IF:  °· You have pain that is not helped with medicine. °· You have puffiness, redness, or pain around your ear. °· You get a stiff neck. °· You cannot move part of your face (paralysis). °· You notice that the bone behind your ear hurts when you touch it. °MAKE SURE YOU:  °· Understand these instructions. °· Will watch your condition. °· Will get help right away if you are not doing well or get worse. °Document Released: 04/29/2008 Document Revised: 11/16/2013 Document Reviewed: 06/08/2013 °[employer]® Patient Information ©2015 [employer]. This information is not intended to replace advice given to you by your health care provider. Make sure you discuss any questions you have with your health care provider. ° °

## 2015-07-06 NOTE — ED Provider Notes (Signed)
CSN: [id number]     Arrival date & time 07/06/15  1350 History   First MD Initiated Contact with Patient 07/06/15 1447     Chief Complaint  Patient presents with  . Otitis Media   (Consider location/radiation/quality/duration/timing/severity/associated sxs/prior Treatment) HPI Comments: 24 year old female complaining of right ear feeling stopped up and uncomfortable. She has decreased hearing. She said it started about 4 days ago. Prior to this had upper respiratory congestion, nasal congestion, runny nose and PND. She has been taking Sudafed PE and Claritin.   Past Medical History  Diagnosis Date  . Allergy   . Asthma     albuterol last used a few months ago  . Preterm labor    Past Surgical History  Procedure Laterality Date  . Wisdom tooth extraction    . Cervical cerclage N/A 04/05/2015    Procedure: CERCLAGE CERVICAL;  Surgeon: Lavonia Drafts, MD;  Location: [facility];  Service: Gynecology;  Laterality: N/A;   Family History  Problem Relation Age of Onset  . Diabetes Mother   . Stroke Mother   . Hypertension Father    Social History  Substance Use Topics  . Smoking status: Former Smoker -- 0.50 packs/day for 10 years    Types: Cigarettes  . Smokeless tobacco: Never Used  . Alcohol Use: No   OB History    Gravida Para Term Preterm AB TAB SAB Ectopic Multiple Living   2 1 0 1 0 0 0 0 0 0      Review of Systems  Constitutional: Negative for fever, chills, activity change, appetite change and fatigue.  HENT: Positive for congestion, postnasal drip and rhinorrhea. Negative for ear discharge and facial swelling.   Eyes: Negative.   Respiratory: Negative.  Negative for cough and shortness of breath.   Cardiovascular: Negative.   Genitourinary: Negative.   Musculoskeletal: Negative.  Negative for neck pain and neck stiffness.  Skin: Negative for pallor and rash.  Neurological: Negative.     Allergies  Shellfish allergy; Sulfa antibiotics; and Icy hot  Home  Medications   Prior to Admission medications   Medication Sig Start Date End Date Taking? Authorizing Provider  albuterol (PROVENTIL HFA;VENTOLIN HFA) 108 (90 BASE) MCG/ACT inhaler Inhale 2 puffs into the lungs every 6 (six) hours as needed for wheezing or shortness of breath. Use spacer 07/01/15   Gwynne Edinger, MD  amoxicillin (AMOXIL) 500 MG capsule Take 2 capsules (1,000 mg total) by mouth 2 (two) times daily. 07/06/15   Janne Napoleon, NP  loratadine (CLARITIN) 10 MG tablet Take 10 mg by mouth daily as needed for allergies.    Historical Provider, MD  Prenatal Vit-Fe Fumarate-FA (PRENATAL MULTIVITAMIN) TABS tablet Take 1 tablet by mouth daily.    Historical Provider, MD  sodium chloride (OCEAN) 0.65 % SOLN nasal spray Place 1 spray into both nostrils as needed for congestion. 07/01/15   Gwynne Edinger, MD  Spacer/Aero-Holding Chambers (AEROCHAMBER PLUS) inhaler Use as instructed 07/01/15   Gwynne Edinger, MD   BP 126/78 mmHg  Pulse 88  Temp(Src) 98.4 F (36.9 C) (Oral)  Resp 16  SpO2 96%  LMP 11/09/2014 Physical Exam  Constitutional: She appears well-developed and well-nourished. No distress.  HENT:  Mouth/Throat: No oropharyngeal exudate.  Right TM dull and with mild bulging. Minimal erythema EAC clear  Left TM with mild retraction., Minor erythema.  Eyes: Conjunctivae and EOM are normal.  Neck: Normal range of motion. Neck supple.  Cardiovascular: Normal rate, regular rhythm and normal heart  sounds.   Pulmonary/Chest: Effort normal and breath sounds normal. No respiratory distress.  Lymphadenopathy:    She has no cervical adenopathy.  Neurological: She is alert. She exhibits normal muscle tone.  Skin: Skin is warm and dry.  Psychiatric: She has a normal mood and affect.  Nursing note and vitals reviewed.   ED Course  Procedures (including critical care time) Labs Review Labs Reviewed - No data to display  Imaging Review No results found.   MDM   1. Otitis media  with effusion, right    Amoxicillin COnt sudafed PE as dir F/U wit PCP as needed    Janne Napoleon, NP 07/06/15 1507

## 2015-07-07 ENCOUNTER — Telehealth: Payer: Self-pay

## 2015-07-07 NOTE — Telephone Encounter (Signed)
Pt called and stated that she is concerned that she may have herpes.

## 2015-07-10 ENCOUNTER — Ambulatory Visit (INDEPENDENT_AMBULATORY_CARE_PROVIDER_SITE_OTHER): Payer: Medicaid Other | Admitting: Family Medicine

## 2015-07-10 ENCOUNTER — Ambulatory Visit: Payer: Medicaid Other

## 2015-07-10 VITALS — BP 124/78 | HR 83 | Temp 98.8°F | Ht 63.0 in | Wt 226.9 lb

## 2015-07-10 DIAGNOSIS — N9089 Other specified noninflammatory disorders of vulva and perineum: Secondary | ICD-10-CM

## 2015-07-10 NOTE — Progress Notes (Signed)
   Subjective:    Patient ID: Kim Johns, female    DOB: 1991-06-18, 24 y.o.   MRN: [id number]  HPI Patient seen for 10 days of intermittent right labial irritation, which she describes as burning discomfort, which started to improve after being seen.  Discomfort worsened on Wednesday.  Improving today.  Was seen last week, had a normal exam.     Review of Systems  Constitutional: Negative for fever, chills and fatigue.  Genitourinary: Negative for dysuria, vaginal bleeding, vaginal discharge and vaginal pain.       Objective:   Physical Exam  Constitutional: She appears well-developed and well-nourished.  Genitourinary: There is no rash, tenderness, lesion or injury on the right labia. There is no rash, tenderness, lesion or injury on the left labia.      Assessment & Plan:  1. Labial irritation No evidence of HSV.  Likely just irritation.  Pt reassured.  Follow up in 1 week for PNV.

## 2015-07-10 NOTE — Telephone Encounter (Signed)
Tienna left another message 07/09/15 Sunday afternoon stating she left a previous message and no one has called her back.  States she thinks she may have herpes lesion and had discussed before with a provider and they told her if she ever has another lesion she should come in while it is active to get it checked.    Called Tienna back and she states the lesion is still active.I informed her our office closed on Friday at 12 noon until Monday am and that is why we have not called her back.  I offered her a add-on appointment today and she agreed to an appointment this afternoon.

## 2015-07-17 ENCOUNTER — Ambulatory Visit (INDEPENDENT_AMBULATORY_CARE_PROVIDER_SITE_OTHER): Payer: Medicaid Other | Admitting: Obstetrics and Gynecology

## 2015-07-17 ENCOUNTER — Other Ambulatory Visit (HOSPITAL_COMMUNITY)
Admission: RE | Admit: 2015-07-17 | Discharge: 2015-07-17 | Disposition: A | Discharge status: Home / Self Care | Payer: Medicaid Other | Source: Ambulatory Visit | Attending: Obstetrics and Gynecology | Admitting: Obstetrics and Gynecology

## 2015-07-17 VITALS — BP 126/71 | HR 83 | Temp 98.7°F | Wt 227.6 lb

## 2015-07-17 DIAGNOSIS — Z113 Encounter for screening for infections with a predominantly sexual mode of transmission: Secondary | ICD-10-CM | POA: Insufficient documentation

## 2015-07-17 DIAGNOSIS — Z3493 Encounter for supervision of normal pregnancy, unspecified, third trimester: Secondary | ICD-10-CM

## 2015-07-17 LAB — POCT URINALYSIS DIP (DEVICE)
BILIRUBIN URINE: NEGATIVE
Glucose, UA: NEGATIVE mg/dL
HGB URINE DIPSTICK: NEGATIVE
KETONES UR: NEGATIVE mg/dL
NITRITE: NEGATIVE
PH: 6.5 (ref 5.0–8.0)
Protein, ur: 30 mg/dL — AB
Specific Gravity, Urine: 1.02 (ref 1.005–1.030)
Urobilinogen, UA: 1 mg/dL (ref 0.0–1.0)

## 2015-07-17 LAB — OB RESULTS CONSOLE GBS: STREP GROUP B AG: POSITIVE

## 2015-07-17 LAB — OB RESULTS CONSOLE GC/CHLAMYDIA: Gonorrhea: NEGATIVE

## 2015-07-17 NOTE — Progress Notes (Signed)
Subjective:  Kim Johns is a 24 y.o. G2P0100 at [redacted]w[redacted]d being seen today for ongoing prenatal care.  Patient reports no complaints.  Contractions: Not present.  Vag. Bleeding: None. Movement: Present. Denies leaking of fluid.   The following portions of the patient's history were reviewed and updated as appropriate: allergies, current medications, past family history, past medical history, past social history, past surgical history and problem list.   Objective:   Filed Vitals:   07/17/15 1121  BP: 126/71  Pulse: 83  Temp: 98.7 F (37.1 C)  Weight: 227 lb 9.6 oz (103.239 kg)    Fetal Status: Fetal Heart Rate (bpm): 150 Fundal Height: 38 cm Movement: Present     General:  Alert, oriented and cooperative. Patient is in no acute distress.  Skin: Skin is warm and dry. No rash noted.   Cardiovascular: Normal heart rate noted  Respiratory: Normal respiratory effort, no problems with respiration noted  Abdomen: Soft, gravid, appropriate for gestational age. Pain/Pressure: Absent     Pelvic: Vag. Bleeding: None     Cervical exam deferred        Extremities: Normal range of motion.  Edema: None  Mental Status: Normal mood and affect. Normal behavior. Normal judgment and thought content.   Urinalysis:      Assessment and Plan:  Pregnancy: G2P0100 at [redacted]w[redacted]d  1. Prenatal care in third trimester - Culture, beta strep (group b only) - GC/Chlamydia probe amp ([facility])not at [facility]  # cervical insufficiency - has stopped 17-p, continues vaginal progesterone - remove cerclage next week  Preterm labor symptoms and general obstetric precautions including but not limited to vaginal bleeding, contractions, leaking of fluid and fetal movement were reviewed in detail with the patient. Please refer to After Visit Summary for other counseling recommendations.  Return in about 1 week (around 07/24/2015).   Gwynne Edinger, MD

## 2015-07-17 NOTE — Progress Notes (Signed)
Declined flu vaccine, Declined 17P injection today Cultures today Breastfeeding tip of the week

## 2015-07-18 LAB — GC/CHLAMYDIA PROBE AMP (~~LOC~~) NOT AT ARMC
Chlamydia: NEGATIVE
NEISSERIA GONORRHEA: NEGATIVE

## 2015-07-20 LAB — CULTURE, BETA STREP (GROUP B ONLY)

## 2015-07-24 ENCOUNTER — Ambulatory Visit (INDEPENDENT_AMBULATORY_CARE_PROVIDER_SITE_OTHER): Payer: Medicaid Other | Admitting: Obstetrics & Gynecology

## 2015-07-24 VITALS — BP 129/72 | HR 84 | Temp 98.4°F | Wt 227.5 lb

## 2015-07-24 DIAGNOSIS — O0991 Supervision of high risk pregnancy, unspecified, first trimester: Secondary | ICD-10-CM

## 2015-07-24 DIAGNOSIS — O3433 Maternal care for cervical incompetence, third trimester: Secondary | ICD-10-CM

## 2015-07-24 LAB — POCT URINALYSIS DIP (DEVICE)
GLUCOSE, UA: NEGATIVE mg/dL
Hgb urine dipstick: NEGATIVE
Ketones, ur: NEGATIVE mg/dL
NITRITE: NEGATIVE
PROTEIN: 30 mg/dL — AB
Specific Gravity, Urine: 1.02 (ref 1.005–1.030)
UROBILINOGEN UA: 1 mg/dL (ref 0.0–1.0)
pH: 7 (ref 5.0–8.0)

## 2015-07-24 NOTE — Progress Notes (Signed)
Ultrasound for growth follow- up scheduled for 08/02/2015 @ 7:30AM.(first available)

## 2015-07-24 NOTE — Progress Notes (Signed)
Subjective:  Kim Johns is a 24 y.o. G2P0100 at [redacted]w[redacted]d being seen today for ongoing prenatal care.  Patient reports no complaints.  Contractions: Irregular.  Vag. Bleeding: None. Movement: Present. Denies leaking of fluid.   The following portions of the patient's history were reviewed and updated as appropriate: allergies, current medications, past family history, past medical history, past social history, past surgical history and problem list.   Objective:   Filed Vitals:   07/24/15 1042  BP: 129/72  Pulse: 84  Temp: 98.4 F (36.9 C)  Weight: 227 lb 8 oz (103.193 kg)    Fetal Status: Fetal Heart Rate (bpm): 142   Movement: Present     General:  Alert, oriented and cooperative. Patient is in no acute distress.  Skin: Skin is warm and dry. No rash noted.   Cardiovascular: Normal heart rate noted  Respiratory: Normal respiratory effort, no problems with respiration noted  Abdomen: Soft, gravid, appropriate for gestational age. Pain/Pressure: Absent     Pelvic: Vag. Bleeding: None     Cervical exam performed      1/50/-3  Extremities: Normal range of motion.  Edema: None  Mental Status: Normal mood and affect. Normal behavior. Normal judgment and thought content.   Urinalysis: Urine Protein: 1+ Urine Glucose: Negative (urine is not clean catch, BP nml)  Assessment and Plan:  Pregnancy: G2P0100 at [redacted]w[redacted]d  1. Supervision of high risk pregnancy in first trimester Size > dates) - US OB Follow Up; Future  Term labor symptoms and general obstetric precautions including but not limited to vaginal bleeding, contractions, leaking of fluid and fetal movement were reviewed in detail with the patient. Please refer to After Visit Summary for other counseling recommendations.   10 days (closed on labor day)  Cerclage removal  Informed consent obtained.  Speculum place in vagina and cerclage visualized at 11 o'clock.  The cerclage was cut without incident.  Cervical exam as  above. vertex Guss Bunde, MD

## 2015-07-24 NOTE — Progress Notes (Signed)
Mod leuks and small bilirubin on UA

## 2015-07-28 ENCOUNTER — Encounter (HOSPITAL_COMMUNITY): Payer: Self-pay | Admitting: *Deleted

## 2015-07-28 ENCOUNTER — Inpatient Hospital Stay (HOSPITAL_COMMUNITY)
Admission: AD | Admit: 2015-07-28 | Discharge: 2015-07-28 | Disposition: A | Discharge status: Home / Self Care | Payer: Medicaid Other | Source: Ambulatory Visit | Attending: Obstetrics & Gynecology | Admitting: Obstetrics & Gynecology

## 2015-07-28 DIAGNOSIS — Z3A37 37 weeks gestation of pregnancy: Secondary | ICD-10-CM | POA: Insufficient documentation

## 2015-07-28 DIAGNOSIS — R350 Frequency of micturition: Secondary | ICD-10-CM | POA: Diagnosis not present

## 2015-07-28 DIAGNOSIS — O2243 Hemorrhoids in pregnancy, third trimester: Secondary | ICD-10-CM | POA: Insufficient documentation

## 2015-07-28 DIAGNOSIS — O479 False labor, unspecified: Secondary | ICD-10-CM

## 2015-07-28 DIAGNOSIS — O471 False labor at or after 37 completed weeks of gestation: Secondary | ICD-10-CM

## 2015-07-28 DIAGNOSIS — Z87891 Personal history of nicotine dependence: Secondary | ICD-10-CM | POA: Insufficient documentation

## 2015-07-28 DIAGNOSIS — IMO0002 Reserved for concepts with insufficient information to code with codable children: Secondary | ICD-10-CM

## 2015-07-28 DIAGNOSIS — R109 Unspecified abdominal pain: Secondary | ICD-10-CM | POA: Diagnosis present

## 2015-07-28 LAB — URINALYSIS, ROUTINE W REFLEX MICROSCOPIC
BILIRUBIN URINE: NEGATIVE
GLUCOSE, UA: NEGATIVE mg/dL
HGB URINE DIPSTICK: NEGATIVE
Ketones, ur: NEGATIVE mg/dL
Leukocytes, UA: NEGATIVE
Nitrite: NEGATIVE
Protein, ur: NEGATIVE mg/dL
SPECIFIC GRAVITY, URINE: 1.01 (ref 1.005–1.030)
UROBILINOGEN UA: 0.2 mg/dL (ref 0.0–1.0)
pH: 6.5 (ref 5.0–8.0)

## 2015-07-28 MED ORDER — HYDROCORTISONE ACE-PRAMOXINE 1-1 % RE FOAM: 1.0000 | Freq: Two times a day (BID) | RECTAL | Status: DC

## 2015-07-28 MED ORDER — TUCKS 50 % EX PADS: 1.0000 "application " | MEDICATED_PAD | Freq: Two times a day (BID) | CUTANEOUS | Status: DC | PRN

## 2015-07-28 NOTE — MAU Provider Note (Signed)
History     CSN: [id number]  Arrival date and time: 07/28/15 4503   First Provider Initiated Contact with Patient 07/28/15 1916      Chief Complaint  Patient presents with  . Abdominal Pain  . Urinary Frequency  . Rectal Pain   This is a 24 y.o. female at [redacted]w[redacted]d who presents with c/o lower abdominal cramping for 2 days, urinary frequency, and hemorrhoid pain. States cerclage was removed this week and is "ready to have this baby".  Denies dysuria.  Felt a small rectal mass that is tender to touch.   Patient is a 24 y.o. female presenting with abdominal pain. The history is provided by the patient.  Abdominal Pain The primary symptoms of the illness include abdominal pain. The primary symptoms of the illness do not include fever, fatigue, shortness of breath, nausea, vomiting, diarrhea, dysuria, vaginal discharge or vaginal bleeding. The current episode started 2 days ago. The onset of the illness was gradual.  The patient states that she believes she is currently pregnant. The patient has not had a change in bowel habit. Additional symptoms associated with the illness include frequency. Symptoms associated with the illness do not include chills, anorexia, heartburn, constipation, urgency, hematuria or back pain.   RN Note: Pt C/O lower abd cramping x 2 days, also urinary frequency, denies dysuria. Pt thinks she may have hemorrhoids, feels something rectally that hurts. Denies LOF or bleeding. Pt had cerclage, was removed this past Monday.          OB History    Gravida Para Term Preterm AB TAB SAB Ectopic Multiple Living   2 1 0 1 0 0 0 0 0 0       Past Medical History  Diagnosis Date  . Allergy   . Asthma     albuterol last used a few months ago  . Preterm labor     Past Surgical History  Procedure Laterality Date  . Wisdom tooth extraction    . Cervical cerclage N/A 04/05/2015    Procedure: CERCLAGE CERVICAL;  Surgeon: Lavonia Drafts, MD;  Location: [facility];   Service: Gynecology;  Laterality: N/A;    Family History  Problem Relation Age of Onset  . Diabetes Mother   . Stroke Mother   . Hypertension Father     Social History  Substance Use Topics  . Smoking status: Former Smoker -- 0.50 packs/day for 10 years    Types: Cigarettes  . Smokeless tobacco: Never Used  . Alcohol Use: No    Allergies:  Allergies  Allergen Reactions  . Shellfish Allergy Hives  . Sulfa Antibiotics Other (See Comments)    Reaction:  Unknown; childhood reaction  . Icy Hot Rash    Prescriptions prior to admission  Medication Sig Dispense Refill Last Dose  . acetaminophen (TYLENOL) 500 MG tablet Take 1,000 mg by mouth every 6 (six) hours as needed for headache.   Past Week at Unknown time  . [employer] SALINE NOSE SPRAY 0.65 % nasal spray PLACE 1 SPRAY INTO BOTH NOSTRILS AS NEEDED FOR CONGESTION.  3 Past Week at Unknown time  . Prenatal Vit-Fe Fumarate-FA (PRENATAL MULTIVITAMIN) TABS tablet Take 1 tablet by mouth daily.   07/27/2015 at Unknown time  . albuterol (PROVENTIL HFA;VENTOLIN HFA) 108 (90 BASE) MCG/ACT inhaler Inhale 2 puffs into the lungs every 6 (six) hours as needed for wheezing or shortness of breath. Use spacer 1 Inhaler 2 rescue  . Spacer/Aero-Holding Chambers (AEROCHAMBER PLUS) inhaler Use as instructed (Patient  not taking: Reported on 07/17/2015) 1 each 2 Not Taking   Medical, Surgical, Family and Social histories reviewed and are listed above.  Medications and allergies reviewed.   Review of Systems  Constitutional: Positive for malaise/fatigue. Negative for fever, chills and fatigue.  Respiratory: Negative for shortness of breath.   Gastrointestinal: Positive for abdominal pain. Negative for heartburn, nausea, vomiting, diarrhea, constipation and anorexia.  Genitourinary: Positive for frequency. Negative for dysuria, urgency, hematuria, vaginal bleeding and vaginal discharge.  Musculoskeletal: Negative for back pain.  Neurological: Negative for  dizziness, focal weakness and weakness.  Other systems negative  Physical Exam   Blood pressure 128/74, pulse 85, temperature 98.3 F (36.8 C), temperature source Oral, resp. rate 18, last menstrual period 11/09/2014.  Physical Exam  Constitutional: She is oriented to person, place, and time. She appears well-developed and well-nourished. No distress.  HENT:  Head: Normocephalic.  Cardiovascular: Normal rate, regular rhythm and normal heart sounds.  Exam reveals no gallop and no friction rub.   No murmur heard. Respiratory: Effort normal and breath sounds normal. No respiratory distress. She has no wheezes. She has no rales. She exhibits no tenderness.  GI: Soft. She exhibits no distension and no mass. There is no tenderness. There is no rebound and no guarding.  Fetal heart rate reactive Irregular mild contractions  Genitourinary: Vagina normal. No vaginal discharge found.  Dilation: 3 Effacement (%): 70 Station: -3 Exam by:: Carmelia Roller, CNM  Small external hemorrhoidal tag visible  Musculoskeletal: Normal range of motion.  Neurological: She is alert and oriented to person, place, and time.  Skin: Skin is warm and dry.  Psychiatric: She has a normal mood and affect.    MAU Course  Procedures  MDM  Will check UA to rule out UTI  Results for orders placed or performed during the hospital encounter of 07/28/15 (from the past 24 hour(s))  Urinalysis, Routine w reflex microscopic (not at [facility])     Status: None   Collection Time: 07/28/15  6:48 PM  Result Value Ref Range   Color, Urine YELLOW YELLOW   APPearance CLEAR CLEAR   Specific Gravity, Urine 1.010 1.005 - 1.030   pH 6.5 5.0 - 8.0   Glucose, UA NEGATIVE NEGATIVE mg/dL   Hgb urine dipstick NEGATIVE NEGATIVE   Bilirubin Urine NEGATIVE NEGATIVE   Ketones, ur NEGATIVE NEGATIVE mg/dL   Protein, ur NEGATIVE NEGATIVE mg/dL   Urobilinogen, UA 0.2 0.0 - 1.0 mg/dL   Nitrite NEGATIVE NEGATIVE   Leukocytes, UA NEGATIVE  NEGATIVE     Assessment and Plan  A:  SIUP at [redacted]w[redacted]d       Recent cerclage removal      Urinary frequency, no evidence of UTI      External hemorrhoid       Prodromal irregular contractions  P:  Discussed UA is negative for UTI       Discharge home       Rx Proctofoam HC and Tucks pads for hemorrhoid care       Labor precautions       Follow up in clinic               [facility] 07/28/2015, 7:46 PM

## 2015-07-28 NOTE — Discharge Instructions (Signed)
Hemorrhoids °Hemorrhoids are swollen veins around the rectum or anus. There are two types of hemorrhoids:  °· Internal hemorrhoids. These occur in the veins just inside the rectum. They may poke through to the outside and become irritated and painful. °· External hemorrhoids. These occur in the veins outside the anus and can be felt as a painful swelling or hard lump near the anus. °CAUSES °· Pregnancy.   °· Obesity.   °· Constipation or diarrhea.   °· Straining to have a bowel movement.   °· Sitting for long periods on the toilet. °· Heavy lifting or other activity that caused you to strain. °· Anal intercourse. °SYMPTOMS  °· Pain.   °· Anal itching or irritation.   °· Rectal bleeding.   °· Fecal leakage.   °· Anal swelling.   °· One or more lumps around the anus.   °DIAGNOSIS  °Your caregiver may be able to diagnose hemorrhoids by visual examination. Other examinations or tests that may be performed include:  °· Examination of the rectal area with a gloved hand (digital rectal exam).   °· Examination of anal canal using a small tube (scope).   °· A blood test if you have lost a significant amount of blood. °· A test to look inside the colon (sigmoidoscopy or colonoscopy). °TREATMENT °Most hemorrhoids can be treated at home. However, if symptoms do not seem to be getting better or if you have a lot of rectal bleeding, your caregiver may perform a procedure to help make the hemorrhoids get smaller or remove them completely. Possible treatments include:  °· Placing a rubber band at the base of the hemorrhoid to cut off the circulation (rubber band ligation).   °· Injecting a chemical to shrink the hemorrhoid (sclerotherapy).   °· Using a tool to burn the hemorrhoid (infrared light therapy).   °· Surgically removing the hemorrhoid (hemorrhoidectomy).   °· Stapling the hemorrhoid to block blood flow to the tissue (hemorrhoid stapling).   °HOME CARE INSTRUCTIONS  °· Eat foods with fiber, such as whole grains, beans,  nuts, fruits, and vegetables. Ask your doctor about taking products with added fiber in them (fiber supplements). °· Increase fluid intake. Drink enough water and fluids to keep your urine clear or pale yellow.   °· Exercise regularly.   °· Go to the bathroom when you have the urge to have a bowel movement. Do not wait.   °· Avoid straining to have bowel movements.   °· Keep the anal area dry and clean. Use wet toilet paper or moist towelettes after a bowel movement.   °· Medicated creams and suppositories may be used or applied as directed.   °· Only take over-the-counter or prescription medicines as directed by your caregiver.   °· Take warm sitz baths for 15-20 minutes, 3-4 times a day to ease pain and discomfort.   °· Place ice packs on the hemorrhoids if they are tender and swollen. Using ice packs between sitz baths may be helpful.   °¨ Put ice in a plastic bag.   °¨ Place a towel between your skin and the bag.   °¨ Leave the ice on for 15-20 minutes, 3-4 times a day.   °· Do not use a donut-shaped pillow or sit on the toilet for long periods. This increases blood pooling and pain.   °SEEK MEDICAL CARE IF: °· You have increasing pain and swelling that is not controlled by treatment or medicine. °· You have uncontrolled bleeding. °· You have difficulty or you are unable to have a bowel movement. °· You have pain or inflammation outside the area of the hemorrhoids. °MAKE SURE YOU: °· Understand these instructions. °·   Will watch your condition.  Will get help right away if you are not doing well or get worse. Document Released: 11/08/2000 Document Revised: 10/28/2012 Document Reviewed: 09/15/2012 [facility] Patient Information 2015 [place], [place]. This information is not intended to replace advice given to you by your health care provider. Make sure you discuss any questions you have with your health care provider. Vaginal Delivery During delivery, your health care provider will help you give birth to your  baby. During a vaginal delivery, you will work to push the baby out of your vagina. However, before you can push your baby out, a few things need to happen. The opening of your uterus (cervix) has to soften, thin out, and open up (dilate) all the way to 10 cm. Also, your baby has to move down from the uterus into your vagina.  SIGNS OF LABOR  Your health care provider will first need to make sure you are in labor. Signs of labor include:   Passing what is called the mucous plug before labor begins. This is a small amount of blood-stained mucus.  Having regular, painful uterine contractions.   The time between contractions gets shorter.   The discomfort and pain gradually get more intense.  Contraction pains get worse when walking and do not go away when resting.   Your cervix becomes thinner (effacement) and dilates. BEFORE THE DELIVERY Once you are in labor and admitted into the hospital or care center, your health care provider may do the following:   Perform a complete physical exam.  Review any complications related to pregnancy or labor.  Check your blood pressure, pulse, temperature, and heart rate (vital signs).   Determine if, and when, the rupture of amniotic membranes occurred.  Do a vaginal exam (using a sterile glove and lubricant) to determine:   The position (presentation) of the baby. Is the baby's head presenting first (vertex) in the birth canal (vagina), or are the feet or buttocks first (breech)?   The level (station) of the baby's head within the birth canal.   The effacement and dilatation of the cervix.   An electronic fetal monitor is usually placed on your abdomen when you first arrive. This is used to monitor your contractions and the baby's heart rate.  When the monitor is on your abdomen (external fetal monitor), it can only pick up the frequency and length of your contractions. It cannot tell the strength of your contractions.  If it becomes  necessary for your health care provider to know exactly how strong your contractions are or to see exactly what the baby's heart rate is doing, an internal monitor may be inserted into your vagina and uterus. Your health care provider will discuss the benefits and risks of using an internal monitor and obtain your permission before inserting the device.  Continuous fetal monitoring may be needed if you have an epidural, are receiving certain medicines (such as oxytocin), or have pregnancy or labor complications.  An IV access tube may be placed into a vein in your arm to deliver fluids and medicines if necessary. THREE STAGES OF LABOR AND DELIVERY Normal labor and delivery is divided into three stages. First Stage This stage starts when you begin to contract regularly and your cervix begins to efface and dilate. It ends when your cervix is completely open (fully dilated). The first stage is the longest stage of labor and can last from 3 hours to 15 hours.  Several methods are available to help with labor  pain. You and your health care provider will decide which option is best for you. Options include:   Opioid medicines. These are strong pain medicines that you can get through your IV tube or as a shot into your muscle. These medicines lessen pain but do not make it go away completely.  Epidural. A medicine is given through a thin tube that is inserted in your back. The medicine numbs the lower part of your body and prevents any pain in that area.  Paracervical pain medicine. This is an injection of an anesthetic on each side of your cervix.   You may request natural childbirth, which does not involve the use of pain medicines or an epidural during labor and delivery. Instead, you will use other things, such as breathing exercises, to help cope with the pain. Second Stage The second stage of labor begins when your cervix is fully dilated at 10 cm. It continues until you push your baby down  through the birth canal and the baby is born. This stage can take only minutes or several hours.  The location of your baby's head as it moves through the birth canal is reported as a number called a station. If the baby's head has not started its descent, the station is described as being at minus 3 (-3). When your baby's head is at the zero station, it is at the middle of the birth canal and is engaged in the pelvis. The station of your baby helps indicate the progress of the second stage of labor.  When your baby is born, your health care provider may hold the baby with his or her head lowered to prevent amniotic fluid, mucus, and blood from getting into the baby's lungs. The baby's mouth and nose may be suctioned with a small bulb syringe to remove any additional fluid.  Your health care provider may then place the baby on your stomach. It is important to keep the baby from getting cold. To do this, the health care provider will dry the baby off, place the baby directly on your skin (with no blankets between you and the baby), and cover the baby with warm, dry blankets.   The umbilical cord is cut. Third Stage During the third stage of labor, your health care provider will deliver the placenta (afterbirth) and make sure your bleeding is under control. The delivery of the placenta usually takes about 5 minutes but can take up to 30 minutes. After the placenta is delivered, a medicine may be given either by IV or injection to help contract the uterus and control bleeding. If you are planning to breastfeed, you can try to do so now. After you deliver the placenta, your uterus should contract and get very firm. If your uterus does not remain firm, your health care provider will massage it. This is important because the contraction of the uterus helps cut off bleeding at the site where the placenta was attached to your uterus. If your uterus does not contract properly and stay firm, you may continue to  bleed heavily. If there is a lot of bleeding, medicines may be given to contract the uterus and stop the bleeding.  Document Released: 08/20/2008 Document Revised: 03/28/2014 Document Reviewed: 05/02/2013 [facility] Patient Information 2015 [place], [place]. This information is not intended to replace advice given to you by your health care provider. Make sure you discuss any questions you have with your health care provider.

## 2015-07-28 NOTE — MAU Note (Addendum)
Pt C/O lower abd cramping x 2 days, also urinary frequency, denies dysuria.  Pt thinks she may have hemorrhoids, feels something rectally that hurts.  Denies LOF or bleeding.  Pt had cerclage, was removed this past Monday.

## 2015-08-02 ENCOUNTER — Ambulatory Visit (HOSPITAL_COMMUNITY)
Admission: RE | Admit: 2015-08-02 | Discharge: 2015-08-02 | Disposition: A | Discharge status: Home / Self Care | Payer: Medicaid Other | Source: Ambulatory Visit | Attending: Obstetrics & Gynecology | Admitting: Obstetrics & Gynecology

## 2015-08-02 ENCOUNTER — Other Ambulatory Visit: Payer: Self-pay | Admitting: Obstetrics & Gynecology

## 2015-08-02 ENCOUNTER — Ambulatory Visit (INDEPENDENT_AMBULATORY_CARE_PROVIDER_SITE_OTHER): Payer: Medicaid Other | Admitting: Obstetrics and Gynecology

## 2015-08-02 VITALS — BP 114/59 | HR 89 | Temp 98.7°F | Wt 231.2 lb

## 2015-08-02 DIAGNOSIS — Z3A38 38 weeks gestation of pregnancy: Secondary | ICD-10-CM

## 2015-08-02 DIAGNOSIS — Z8751 Personal history of pre-term labor: Secondary | ICD-10-CM

## 2015-08-02 DIAGNOSIS — O3440 Maternal care for other abnormalities of cervix, unspecified trimester: Secondary | ICD-10-CM | POA: Diagnosis present

## 2015-08-02 DIAGNOSIS — O26843 Uterine size-date discrepancy, third trimester: Secondary | ICD-10-CM

## 2015-08-02 DIAGNOSIS — O3430 Maternal care for cervical incompetence, unspecified trimester: Secondary | ICD-10-CM

## 2015-08-02 DIAGNOSIS — O0991 Supervision of high risk pregnancy, unspecified, first trimester: Secondary | ICD-10-CM

## 2015-08-02 LAB — POCT URINALYSIS DIP (DEVICE)
BILIRUBIN URINE: NEGATIVE
Glucose, UA: NEGATIVE mg/dL
Hgb urine dipstick: NEGATIVE
KETONES UR: NEGATIVE mg/dL
NITRITE: NEGATIVE
PH: 7 (ref 5.0–8.0)
Protein, ur: NEGATIVE mg/dL
Specific Gravity, Urine: 1.02 (ref 1.005–1.030)
Urobilinogen, UA: 0.2 mg/dL (ref 0.0–1.0)

## 2015-08-02 NOTE — Progress Notes (Signed)
Subjective:  Kim Johns is a 24 y.o. G2P0100 at [redacted]w[redacted]d being seen today for ongoing prenatal care.  Patient reports no complaints.  Contractions: Irregular.  Vag. Bleeding: None. Movement: Present. Denies leaking of fluid.   The following portions of the patient's history were reviewed and updated as appropriate: allergies, current medications, past family history, past medical history, past social history, past surgical history and problem list.   Objective:   Filed Vitals:   08/02/15 0920  BP: 114/59  Pulse: 89  Temp: 98.7 F (37.1 C)  Weight: 231 lb 3.2 oz (104.872 kg)    Fetal Status: Fetal Heart Rate (bpm): 151 Fundal Height: 41 cm Movement: Present  Presentation: Vertex  General:  Alert, oriented and cooperative. Patient is in no acute distress.  Skin: Skin is warm and dry. No rash noted.   Cardiovascular: Normal heart rate noted  Respiratory: Normal respiratory effort, no problems with respiration noted  Abdomen: Soft, gravid, appropriate for gestational age. Pain/Pressure: Present     Pelvic: Vag. Bleeding: None     Cervical exam deferred        Extremities: Normal range of motion.  Edema: None  Mental Status: Normal mood and affect. Normal behavior. Normal judgment and thought content.   Urinalysis: Urine Protein: Negative Urine Glucose: Negative  Assessment and Plan:  Pregnancy: G2P0100 at [redacted]w[redacted]d  1. Cervical insufficiency in pregnancy, antepartum, unspecified trimester Cerclage out, done with 17-P Declines flu vaccine GBS positive, no pcn allergy Per pt u/s performed today for size greater than dates was wnl, will f/u read  Term labor symptoms and general obstetric precautions including but not limited to vaginal bleeding, contractions, leaking of fluid and fetal movement were reviewed in detail with the patient. Please refer to After Visit Summary for other counseling recommendations.  Return in about 1 week (around 08/09/2015).   Gwynne Edinger, MD

## 2015-08-02 NOTE — Progress Notes (Signed)
Breastfeeding tip of the week reviewed Request cervical exam Leukocytes: small

## 2015-08-09 ENCOUNTER — Ambulatory Visit (INDEPENDENT_AMBULATORY_CARE_PROVIDER_SITE_OTHER): Payer: Medicaid Other | Admitting: Certified Nurse Midwife

## 2015-08-09 VITALS — BP 128/77 | HR 79 | Temp 98.7°F | Wt 228.4 lb

## 2015-08-09 DIAGNOSIS — Z3483 Encounter for supervision of other normal pregnancy, third trimester: Secondary | ICD-10-CM

## 2015-08-09 DIAGNOSIS — R82998 Other abnormal findings in urine: Secondary | ICD-10-CM

## 2015-08-09 DIAGNOSIS — O2343 Unspecified infection of urinary tract in pregnancy, third trimester: Secondary | ICD-10-CM | POA: Diagnosis not present

## 2015-08-09 LAB — POCT URINALYSIS DIP (DEVICE)
Bilirubin Urine: NEGATIVE
Bilirubin Urine: NEGATIVE
Glucose, UA: NEGATIVE mg/dL
Glucose, UA: NEGATIVE mg/dL
Hgb urine dipstick: NEGATIVE
Ketones, ur: NEGATIVE mg/dL
Ketones, ur: NEGATIVE mg/dL
Nitrite: NEGATIVE
Nitrite: NEGATIVE
Protein, ur: NEGATIVE mg/dL
Protein, ur: NEGATIVE mg/dL
Specific Gravity, Urine: 1.02 (ref 1.005–1.030)
Specific Gravity, Urine: 1.015 (ref 1.005–1.030)
Urobilinogen, UA: 0.2 mg/dL (ref 0.0–1.0)
Urobilinogen, UA: 0.2 mg/dL (ref 0.0–1.0)
pH: 7 (ref 5.0–8.0)
pH: 7 (ref 5.0–8.0)

## 2015-08-09 NOTE — Patient Instructions (Signed)
Third Trimester of Pregnancy The third trimester is from week 29 through week 42, months 7 through 9. The third trimester is a time when the fetus is growing rapidly. At the end of the ninth month, the fetus is about 20 inches in length and weighs 6-10 pounds.  BODY CHANGES Your body goes through many changes during pregnancy. The changes vary from woman to woman.   Your weight will continue to increase. You can expect to gain 25-35 pounds (11-16 kg) by the end of the pregnancy.  You may begin to get stretch marks on your hips, abdomen, and breasts.  You may urinate more often because the fetus is moving lower into your pelvis and pressing on your bladder.  You may develop or continue to have heartburn as a result of your pregnancy.  You may develop constipation because certain hormones are causing the muscles that push waste through your intestines to slow down.  You may develop hemorrhoids or swollen, bulging veins (varicose veins).  You may have pelvic pain because of the weight gain and pregnancy hormones relaxing your joints between the bones in your pelvis. Backaches may result from overexertion of the muscles supporting your posture.  You may have changes in your hair. These can include thickening of your hair, rapid growth, and changes in texture. Some women also have hair loss during or after pregnancy, or hair that feels dry or thin. Your hair will most likely return to normal after your baby is born.  Your breasts will continue to grow and be tender. A yellow discharge may leak from your breasts called colostrum.  Your belly button may stick out.  You may feel short of breath because of your expanding uterus.  You may notice the fetus "dropping," or moving lower in your abdomen.  You may have a bloody mucus discharge. This usually occurs a few days to a week before labor begins.  Your cervix becomes thin and soft (effaced) near your due date. WHAT TO EXPECT AT YOUR PRENATAL  EXAMS  You will have prenatal exams every 2 weeks until week 36. Then, you will have weekly prenatal exams. During a routine prenatal visit:  You will be weighed to make sure you and the fetus are growing normally.  Your blood pressure is taken.  Your abdomen will be measured to track your baby's growth.  The fetal heartbeat will be listened to.  Any test results from the previous visit will be discussed.  You may have a cervical check near your due date to see if you have effaced. At around 36 weeks, your caregiver will check your cervix. At the same time, your caregiver will also perform a test on the secretions of the vaginal tissue. This test is to determine if a type of bacteria, Group B streptococcus, is present. Your caregiver will explain this further. Your caregiver may ask you:  What your birth plan is.  How you are feeling.  If you are feeling the baby move.  If you have had any abnormal symptoms, such as leaking fluid, bleeding, severe headaches, or abdominal cramping.  If you have any questions. Other tests or screenings that may be performed during your third trimester include:  Blood tests that check for low iron levels (anemia).  Fetal testing to check the health, activity level, and growth of the fetus. Testing is done if you have certain medical conditions or if there are problems during the pregnancy. FALSE LABOR You may feel small, irregular contractions that   eventually go away. These are called Braxton Hicks contractions, or false labor. Contractions may last for hours, days, or even weeks before true labor sets in. If contractions come at regular intervals, intensify, or become painful, it is best to be seen by your caregiver.  SIGNS OF LABOR   Menstrual-like cramps.  Contractions that are 5 minutes apart or less.  Contractions that start on the top of the uterus and spread down to the lower abdomen and back.  A sense of increased pelvic pressure or back  pain.  A watery or bloody mucus discharge that comes from the vagina. If you have any of these signs before the 37th week of pregnancy, call your caregiver right away. You need to go to the hospital to get checked immediately. HOME CARE INSTRUCTIONS   Avoid all smoking, herbs, alcohol, and unprescribed drugs. These chemicals affect the formation and growth of the baby.  Follow your caregiver's instructions regarding medicine use. There are medicines that are either safe or unsafe to take during pregnancy.  Exercise only as directed by your caregiver. Experiencing uterine cramps is a good sign to stop exercising.  Continue to eat regular, healthy meals.  Wear a good support bra for breast tenderness.  Do not use hot tubs, steam rooms, or saunas.  Wear your seat belt at all times when driving.  Avoid raw meat, uncooked cheese, cat litter boxes, and soil used by cats. These carry germs that can cause birth defects in the baby.  Take your prenatal vitamins.  Try taking a stool softener (if your caregiver approves) if you develop constipation. Eat more high-fiber foods, such as fresh vegetables or fruit and whole grains. Drink plenty of fluids to keep your urine clear or pale yellow.  Take warm sitz baths to soothe any pain or discomfort caused by hemorrhoids. Use hemorrhoid cream if your caregiver approves.  If you develop varicose veins, wear support hose. Elevate your feet for 15 minutes, 3-4 times a day. Limit salt in your diet.  Avoid heavy lifting, wear low heal shoes, and practice good posture.  Rest a lot with your legs elevated if you have leg cramps or low back pain.  Visit your dentist if you have not gone during your pregnancy. Use a soft toothbrush to brush your teeth and be gentle when you floss.  A sexual relationship may be continued unless your caregiver directs you otherwise.  Do not travel far distances unless it is absolutely necessary and only with the approval  of your caregiver.  Take prenatal classes to understand, practice, and ask questions about the labor and delivery.  Make a trial run to the hospital.  Pack your hospital bag.  Prepare the baby's nursery.  Continue to go to all your prenatal visits as directed by your caregiver. SEEK MEDICAL CARE IF:  You are unsure if you are in labor or if your water has broken.  You have dizziness.  You have mild pelvic cramps, pelvic pressure, or nagging pain in your abdominal area.  You have persistent nausea, vomiting, or diarrhea.  You have a bad smelling vaginal discharge.  You have pain with urination. SEEK IMMEDIATE MEDICAL CARE IF:   You have a fever.  You are leaking fluid from your vagina.  You have spotting or bleeding from your vagina.  You have severe abdominal cramping or pain.  You have rapid weight loss or gain.  You have shortness of breath with chest pain.  You notice sudden or extreme swelling   of your face, hands, ankles, feet, or legs.  You have not felt your baby move in over an hour.  You have severe headaches that do not go away with medicine.  You have vision changes. Document Released: 11/05/2001 Document Revised: 11/16/2013 Document Reviewed: 01/12/2013 [employer] Patient Information 2015 [employer]. This information is not intended to replace advice given to you by your health care provider. Make sure you discuss any questions you have with your health care provider.  

## 2015-08-09 NOTE — Progress Notes (Signed)
States having trouble passing urine-trouble initating flow, and small amount voided-denies burning or pain with urinating but states feels bladder felt sore. Urinalysis shows moderate leukocytes and trace hemoglobin.

## 2015-08-09 NOTE — Addendum Note (Signed)
Addended byGeralyn Flash L on: 08/09/2015 11:03 AM   Modules accepted: Orders

## 2015-08-09 NOTE — Progress Notes (Signed)
Subjective:  Kim Johns is a 24 y.o. G2P0100 at [redacted]w[redacted]d being seen today for ongoing prenatal care.  Patient reports no complaints.  Contractions: Irregular.  Vag. Bleeding: None. Movement: Present. Denies leaking of fluid.   The following portions of the patient's history were reviewed and updated as appropriate: allergies, current medications, past family history, past medical history, past social history, past surgical history and problem list.   Objective:   Filed Vitals:   08/09/15 1031  BP: 128/77  Pulse: 79  Temp: 98.7 F (37.1 C)  Weight: 228 lb 6.4 oz (103.602 kg)    Fetal Status: Fetal Heart Rate (bpm): 136   Movement: Present     General:  Alert, oriented and cooperative. Patient is in no acute distress.  Skin: Skin is warm and dry. No rash noted.   Cardiovascular: Normal heart rate noted  Respiratory: Normal respiratory effort, no problems with respiration noted  Abdomen: Soft, gravid, appropriate for gestational age. Pain/Pressure: Present     Pelvic: Vag. Bleeding: None     Cervical exam performed       2-3/70/-3  Extremities: Normal range of motion.  Edema: None  Mental Status: Normal mood and affect. Normal behavior. Normal judgment and thought content.   Urinalysis: Urine Protein: Negative Urine Glucose: Negative  Assessment and Plan:  Pregnancy: G2P0100 at [redacted]w[redacted]d  There are no diagnoses linked to this encounter. Term labor symptoms and general obstetric precautions including but not limited to vaginal bleeding, contractions, leaking of fluid and fetal movement were reviewed in detail with the patient. Please refer to After Visit Summary for other counseling recommendations.  Return in about 1 week (around 08/16/2015).   Larey Days, CNM

## 2015-08-10 LAB — CULTURE, OB URINE
Colony Count: NO GROWTH
Organism ID, Bacteria: NO GROWTH

## 2015-08-11 ENCOUNTER — Encounter (HOSPITAL_COMMUNITY): Payer: Self-pay

## 2015-08-11 ENCOUNTER — Inpatient Hospital Stay (HOSPITAL_COMMUNITY)
Admission: AD | Admit: 2015-08-11 | Discharge: 2015-08-11 | Disposition: A | Discharge status: Home / Self Care | Payer: Medicaid Other | Source: Ambulatory Visit | Attending: Family Medicine | Admitting: Family Medicine

## 2015-08-11 DIAGNOSIS — Z3493 Encounter for supervision of normal pregnancy, unspecified, third trimester: Secondary | ICD-10-CM | POA: Insufficient documentation

## 2015-08-11 NOTE — MAU Provider Note (Signed)
  History     CSN: [id number]  CC: Here for increased fetal movement  HPI Patient came in to be evaluated due to 4 hours of increased fetal movement while she was lying in bed. Patient was worried something was wrong. Denies any pain, LOF, VB, contractions. No other acute complaints.    Past Medical History  Diagnosis Date  . Allergy   . Asthma     albuterol last used a few months ago  . Preterm labor     Past Surgical History  Procedure Laterality Date  . Wisdom tooth extraction    . Cervical cerclage N/A 04/05/2015    Procedure: CERCLAGE CERVICAL;  Surgeon: Lavonia Drafts, MD;  Location: [facility];  Service: Gynecology;  Laterality: N/A;    Family History  Problem Relation Age of Onset  . Diabetes Mother   . Stroke Mother   . Hypertension Father     Social History  Substance Use Topics  . Smoking status: Former Smoker -- 0.50 packs/day for 10 years    Types: Cigarettes  . Smokeless tobacco: Never Used  . Alcohol Use: No    Allergies:  Allergies  Allergen Reactions  . Shellfish Allergy Hives  . Sulfa Antibiotics Other (See Comments)    Reaction:  Unknown; childhood reaction  . Icy Hot Rash    No prescriptions prior to admission    Review of Systems  Constitutional: Negative for fever and chills.  Eyes: Negative for blurred vision.  Respiratory: Negative for cough and shortness of breath.   Cardiovascular: Negative for chest pain and palpitations.  Gastrointestinal: Negative for heartburn, nausea, vomiting and abdominal pain.  Genitourinary: Negative for dysuria.  Musculoskeletal: Negative.   Skin: Negative.   Neurological: Negative for dizziness and headaches.  Endo/Heme/Allergies: Negative.   Psychiatric/Behavioral: Negative.    Physical Exam   Last menstrual period 11/09/2014.  Physical Exam  Constitutional: She is oriented to person, place, and time. She appears well-developed and well-nourished.  HENT:  Head: Normocephalic and  atraumatic.  Eyes: Conjunctivae are normal. Pupils are equal, round, and reactive to light.  GI: Soft. She exhibits no distension. There is no tenderness. There is no rebound and no guarding.  Musculoskeletal: Normal range of motion.  Neurological: She is alert and oriented to person, place, and time.  Skin: Skin is warm and dry.  Psychiatric: She has a normal mood and affect. Her behavior is normal. Judgment and thought content normal.    MAU Course  Procedures  MDM EFM: FHR 125, moderate variability, +accels 15x15, no decels, toco: occasional contraction not felt by patient   Assessment and Plan  23yo g2p0010 here for increased fetal movement -had cerclage this pregnancy -No signs of labor or fetal distress -Reassurance given to patient -Discussed that increased fetal movement is good and decreased fetal movement is more a reason to be concerned -Discussed labor precautions and to come in if any decrease fetal movement, LOF, contractions with regular pattern -F/u in clinic as scheduled  Kim Johns 08/11/2015, 5:54 AM   Pt reassured.  POC discussed; agree with resident.

## 2015-08-11 NOTE — MAU Note (Signed)
Pt is here with increased fetal movement. Says "baby moved around a whole lot for 3-4 hours, more than usual, now has slowed down."

## 2015-08-16 ENCOUNTER — Ambulatory Visit (INDEPENDENT_AMBULATORY_CARE_PROVIDER_SITE_OTHER): Payer: Medicaid Other | Admitting: Advanced Practice Midwife

## 2015-08-16 VITALS — BP 125/76 | HR 90 | Temp 98.6°F | Wt 231.6 lb

## 2015-08-16 DIAGNOSIS — O0991 Supervision of high risk pregnancy, unspecified, first trimester: Secondary | ICD-10-CM

## 2015-08-16 DIAGNOSIS — O09213 Supervision of pregnancy with history of pre-term labor, third trimester: Secondary | ICD-10-CM | POA: Diagnosis present

## 2015-08-16 DIAGNOSIS — Z8751 Personal history of pre-term labor: Secondary | ICD-10-CM

## 2015-08-16 DIAGNOSIS — O09293 Supervision of pregnancy with other poor reproductive or obstetric history, third trimester: Secondary | ICD-10-CM

## 2015-08-16 DIAGNOSIS — O0993 Supervision of high risk pregnancy, unspecified, third trimester: Secondary | ICD-10-CM

## 2015-08-16 LAB — POCT URINALYSIS DIP (DEVICE)
Bilirubin Urine: NEGATIVE
GLUCOSE, UA: NEGATIVE mg/dL
Hgb urine dipstick: NEGATIVE
KETONES UR: NEGATIVE mg/dL
Nitrite: NEGATIVE
Protein, ur: 100 mg/dL — AB
SPECIFIC GRAVITY, URINE: 1.015 (ref 1.005–1.030)
UROBILINOGEN UA: 0.2 mg/dL (ref 0.0–1.0)
pH: 7 (ref 5.0–8.0)

## 2015-08-16 NOTE — Progress Notes (Signed)
Subjective:  Kim Johns is a 24 y.o. G2P0100 at [redacted]w[redacted]d being seen today for ongoing prenatal care.  Patient reports no complaints.  Contractions: Irregular.  Vag. Bleeding: None. Movement: Present. Denies leaking of fluid.   The following portions of the patient's history were reviewed and updated as appropriate: allergies, current medications, past family history, past medical history, past social history, past surgical history and problem list.   Objective:   Filed Vitals:   08/16/15 0947  BP: 125/76  Pulse: 90  Temp: 98.6 F (37 C)  Weight: 231 lb 9.6 oz (105.053 kg)    Fetal Status: Fetal Heart Rate (bpm): 153   Movement: Present     General:  Alert, oriented and cooperative. Patient is in no acute distress.  Skin: Skin is warm and dry. No rash noted.   Cardiovascular: Normal heart rate noted  Respiratory: Normal respiratory effort, no problems with respiration noted  Abdomen: Soft, gravid, appropriate for gestational age. Pain/Pressure: Present     Pelvic: Vag. Bleeding: None     Cervical exam performed3.5/60/-2, soft, vtx. Membranes swept  Extremities: Normal range of motion.  Edema: Trace  Mental Status: Normal mood and affect. Normal behavior. Normal judgment and thought content.   Urinalysis: Urine Protein: 2+ Urine Glucose: Negative  Assessment and Plan:  Pregnancy: G2P0100 at [redacted]w[redacted]d  1. History of preterm delivery   2. Hx of Preterm premature rupture of membranes (PPROM) with unknown onset of labor   3. Supervision of high risk pregnancy in first trimester   Term labor symptoms and general obstetric precautions including but not limited to vaginal bleeding, contractions, leaking of fluid and fetal movement were reviewed in detail with the patient. Please refer to After Visit Summary for other counseling recommendations.  Return in about 2 days (around 08/18/2015) for NST/AFI as scheduled.  IOL 08/23/15 at 0630,. [place], CNM

## 2015-08-16 NOTE — Progress Notes (Signed)
Pt reports generalized pain; can not sleep.  Also c/o headaches  Edema- feet

## 2015-08-17 ENCOUNTER — Telehealth (HOSPITAL_COMMUNITY): Payer: Self-pay | Admitting: *Deleted

## 2015-08-17 ENCOUNTER — Encounter (HOSPITAL_COMMUNITY): Payer: Self-pay | Admitting: *Deleted

## 2015-08-17 NOTE — Telephone Encounter (Signed)
Preadmission screen  

## 2015-08-18 ENCOUNTER — Ambulatory Visit (INDEPENDENT_AMBULATORY_CARE_PROVIDER_SITE_OTHER): Payer: Medicaid Other | Admitting: *Deleted

## 2015-08-18 VITALS — BP 124/82 | HR 90

## 2015-08-18 DIAGNOSIS — O480 Post-term pregnancy: Secondary | ICD-10-CM | POA: Diagnosis present

## 2015-08-18 NOTE — Progress Notes (Signed)
nst reactive

## 2015-08-19 ENCOUNTER — Encounter (HOSPITAL_COMMUNITY): Payer: Self-pay | Admitting: *Deleted

## 2015-08-19 ENCOUNTER — Inpatient Hospital Stay (HOSPITAL_COMMUNITY)
Admission: AD | Admit: 2015-08-19 | Discharge: 2015-08-19 | Disposition: A | Discharge status: Home / Self Care | Payer: Medicaid Other | Source: Ambulatory Visit | Attending: Family Medicine | Admitting: Family Medicine

## 2015-08-19 DIAGNOSIS — Z87891 Personal history of nicotine dependence: Secondary | ICD-10-CM | POA: Diagnosis not present

## 2015-08-19 DIAGNOSIS — N644 Mastodynia: Secondary | ICD-10-CM

## 2015-08-19 DIAGNOSIS — O26893 Other specified pregnancy related conditions, third trimester: Secondary | ICD-10-CM | POA: Insufficient documentation

## 2015-08-19 DIAGNOSIS — Z3A40 40 weeks gestation of pregnancy: Secondary | ICD-10-CM | POA: Insufficient documentation

## 2015-08-19 DIAGNOSIS — O9229 Other disorders of breast associated with pregnancy and the puerperium: Secondary | ICD-10-CM | POA: Diagnosis not present

## 2015-08-19 DIAGNOSIS — N6310 Unspecified lump in the right breast, unspecified quadrant: Secondary | ICD-10-CM

## 2015-08-19 LAB — CBC WITH DIFFERENTIAL/PLATELET
BASOS PCT: 0 %
Basophils Absolute: 0 10*3/uL (ref 0.0–0.1)
Eosinophils Absolute: 0.1 10*3/uL (ref 0.0–0.7)
Eosinophils Relative: 1 %
HEMATOCRIT: 36 % (ref 36.0–46.0)
HEMOGLOBIN: 12.9 g/dL (ref 12.0–15.0)
LYMPHS ABS: 2.1 10*3/uL (ref 0.7–4.0)
Lymphocytes Relative: 20 %
MCH: 33.4 pg (ref 26.0–34.0)
MCHC: 35.8 g/dL (ref 30.0–36.0)
MCV: 93.3 fL (ref 78.0–100.0)
MONOS PCT: 8 %
Monocytes Absolute: 0.9 10*3/uL (ref 0.1–1.0)
NEUTROS ABS: 7.5 10*3/uL (ref 1.7–7.7)
NEUTROS PCT: 71 %
Platelets: 203 10*3/uL (ref 150–400)
RBC: 3.86 MIL/uL — AB (ref 3.87–5.11)
RDW: 13.9 % (ref 11.5–15.5)
WBC: 10.6 10*3/uL — AB (ref 4.0–10.5)

## 2015-08-19 MED ORDER — ACETAMINOPHEN 500 MG PO TABS
1000.0000 mg | ORAL_TABLET | Freq: Once | ORAL | Status: AC
  Administered 2015-08-19: 1000 mg via ORAL
  Filled 2015-08-19: qty 2

## 2015-08-19 MED ORDER — TRAMADOL HCL 50 MG PO TABS: 50.0000 mg | ORAL_TABLET | Freq: Four times a day (QID) | ORAL | Status: DC | PRN

## 2015-08-19 NOTE — Progress Notes (Signed)
Kim Johns in to see pt

## 2015-08-19 NOTE — Discharge Instructions (Signed)
Mastitis Mastitis is inflammation of the breast tissue. It occurs most often in women who are breastfeeding, but it can also affect other women, and even sometimes men. CAUSES  Mastitis is usually caused by a bacterial infection. Bacteria enter the breast tissue through cuts or openings in the skin. Typically, this occurs with breastfeeding because of cracked or irritated skin. Sometimes, it can occur even when there is no opening in the skin. It can be associated with plugged milk (lactiferous) ducts. Nipple piercing can also lead to mastitis. Also, some forms of breast cancer can cause mastitis. SIGNS AND SYMPTOMS   Swelling, redness, tenderness, and pain in an area of the breast.  Swelling of the glands under the arm on the same side.  Fever. If an infection is allowed to progress, a collection of pus (abscess) may develop. DIAGNOSIS  Your health care provider can usually diagnose mastitis based on your symptoms and a physical exam. Tests may be done to help confirm the diagnosis. These may include:   Removal of pus from the breast by applying pressure to the area. This pus can be examined in the lab to determine which bacteria are present. If an abscess has developed, the fluid in the abscess can be removed with a needle. This can also be used to confirm the diagnosis and determine the bacteria present. In most cases, pus will not be present.  Blood tests to determine if your body is fighting a bacterial infection.  Mammogram or ultrasound tests to rule out other problems or diseases. TREATMENT  Antibiotic medicine is used to treat a bacterial infection. Your health care provider will determine which bacteria are most likely causing the infection and will select an appropriate antibiotic. This is sometimes changed based on the results of tests performed to identify the bacteria, or if there is no response to the antibiotic selected. Antibiotics are usually given by mouth. You may also be  given medicine for pain. Mastitis that occurs with breastfeeding will sometimes go away on its own, so your health care provider may choose to wait 24 hours after first seeing you to decide whether a prescription medicine is needed. HOME CARE INSTRUCTIONS   Only take over-the-counter or prescription medicines for pain, fever, or discomfort as directed by your health care provider.  If your health care provider prescribed an antibiotic, take the medicine as directed. Make sure you finish it even if you start to feel better.  Do not wear a tight or underwire bra. Wear a soft, supportive bra.  Increase your fluid intake, especially if you have a fever.  Women who are breastfeeding should follow these instructions:  Continue to empty the breast. Your health care provider can tell you whether this milk is safe for your infant or needs to be thrown out. You may be told to stop nursing until your health care provider thinks it is safe for your baby. Use a breast pump if you are advised to stop nursing.  Keep your nipples clean and dry.  Empty the first breast completely before going to the other breast. If your baby is not emptying your breasts completely for some reason, use a breast pump to empty your breasts.  If you go back to work, pump your breasts while at work to stay in time with your nursing schedule.  Avoid allowing your breasts to become overly filled with milk (engorged). SEEK MEDICAL CARE IF:   You have pus-like discharge from the breast.  Your symptoms do not   improve with the treatment prescribed by your health care provider within 2 days. SEEK IMMEDIATE MEDICAL CARE IF:   Your pain and swelling are getting worse.  You have pain that is not controlled with medicine.  You have a red line extending from the breast toward your armpit.  You have a fever or persistent symptoms for more than 2-3 days.  You have a fever and your symptoms suddenly get worse. Document Released:  11/11/2005 Document Revised: 11/16/2013 Document Reviewed: 06/11/2013 ExitCare Patient Information 2015 [employer]. This information is not intended to replace advice given to you by your health care provider. Make sure you discuss any questions you have with your health care provider.  

## 2015-08-19 NOTE — MAU Provider Note (Signed)
Chief Complaint:  Breast Pain   First Provider Initiated Contact with Patient 08/19/15 [phone]     HPI: Kim Johns is a 24 y.o. G2P0100 at [redacted]w[redacted]d who presents to maternity admissions reporting lump and worsening pain behind right nipple 2 days. States she was told it might be a clogged milk duct and instructed to use warm compresses. Symptoms actually got worse rather than improving after using warm compresses. Pain has been so bad that she hasn't been able to sleep tonight. Nipple rings that she removed at the beginning of pregnancy. No history of breast masses or nipple infections.  Location: Behind right nipple Quality: Sharp Severity: 9/10 in pain scale Duration: 2 days Context: None Timing: Constant Modifying factors: Worse with warm compresses. Hasn't tried anything else for the pain. Associated signs and symptoms: Negative for fever, chills, nipple discharge.   Denies contractions, leakage of fluid or vaginal bleeding. Good fetal movement.   Past Medical History: Past Medical History  Diagnosis Date  . Allergy   . Asthma     albuterol last used a few months ago  . Preterm labor     Past obstetric history: OB History  Gravida Para Term Preterm AB SAB TAB Ectopic Multiple Living  2 1 0 1 0 0 0 0 0 0     # Outcome Date GA Lbr Len/2nd Weight Sex Delivery Anes PTL Lv  2 Current           1 Preterm 06/17/14 [redacted]w[redacted]d 03:59 0 lb (0 kg) F  None  FD     Complications: Premature rupture of membranes in second trimester      Past Surgical History: Past Surgical History  Procedure Laterality Date  . Wisdom tooth extraction    . Cervical cerclage N/A 04/05/2015    Procedure: CERCLAGE CERVICAL;  Surgeon: Lavonia Drafts, MD;  Location: [facility];  Service: Gynecology;  Laterality: N/A;     Family History: Family History  Problem Relation Age of Onset  . Diabetes Mother   . Stroke Mother   . Hypertension Father     Social History: Social History  Substance Use Topics   . Smoking status: Former Smoker -- 0.50 packs/day for 10 years    Types: Cigarettes  . Smokeless tobacco: Never Used  . Alcohol Use: No    Allergies:  Allergies  Allergen Reactions  . Shellfish Allergy Hives  . Sulfa Antibiotics Other (See Comments)    Reaction:  Unknown; childhood reaction  . Icy Hot Rash    Meds:  Prescriptions prior to admission  Medication Sig Dispense Refill Last Dose  . acetaminophen (TYLENOL) 500 MG tablet Take 1,000 mg by mouth every 6 (six) hours as needed for headache.   Taking  . albuterol (PROVENTIL HFA;VENTOLIN HFA) 108 (90 BASE) MCG/ACT inhaler Inhale 2 puffs into the lungs every 6 (six) hours as needed for wheezing or shortness of breath. Use spacer 1 Inhaler 2 Taking  . [employer] SALINE NOSE SPRAY 0.65 % nasal spray PLACE 1 SPRAY INTO BOTH NOSTRILS AS NEEDED FOR CONGESTION.  3 Taking  . hydrocortisone-pramoxine (PROCTOFOAM HC) rectal foam Place 1 applicator rectally 2 (two) times daily. 10 g 0 Taking  . Prenatal Vit-Fe Fumarate-FA (PRENATAL MULTIVITAMIN) TABS tablet Take 1 tablet by mouth daily.   Taking  . Spacer/Aero-Holding Chambers (AEROCHAMBER PLUS) inhaler Use as instructed 1 each 2 Taking  . Witch Hazel (TUCKS) 50 % PADS Apply 1 application topically 2 (two) times daily as needed. 1 each 3 Taking  I have reviewed patient's Past Medical Hx, Surgical Hx, Family Hx, Social Hx, medications and allergies.   ROS:  Review of Systems  Constitutional: Negative for fever and chills.  Skin:       Positive for tender mass behind right nipple. Negative for nipple discharge or bleeding.    Physical Exam   Patient Vitals for the past 24 hrs:  BP Temp Pulse Resp  08/19/15 0404 130/73 mmHg - 94 -  08/19/15 0333 133/87 mmHg 98.7 F (37.1 C) 102 18   Constitutional: Well-developed, well-nourished female in no acute distress.  Cardiovascular: normal rate Respiratory: normal effort Breast: 3 cm firm, tender, nonfluctuant mass behind nipple. No visible  erythema but patient's nipple and areola are extremely dark, making erythema impossible to see. No nipple drainage or bleeding. No lymphadenopathy. Left breast normal.  GI: Abd gravid, size greater than dates.  Neurologic: Alert and oriented x 4.  GU: Deferred   FHT:  Baseline 130 ,  minimal variability initially, then moderate variability, accelerations present, no decelerations Contractions: Rare, mild   Labs: Results for orders placed or performed during the hospital encounter of 08/19/15 (from the past 24 hour(s))  CBC with Differential/Platelet     Status: Abnormal   Collection Time: 08/19/15  4:30 AM  Result Value Ref Range   WBC 10.6 (H) 4.0 - 10.5 K/uL   RBC 3.86 (L) 3.87 - 5.11 MIL/uL   Hemoglobin 12.9 12.0 - 15.0 g/dL   HCT 36.0 36.0 - 46.0 %   MCV 93.3 78.0 - 100.0 fL   MCH 33.4 26.0 - 34.0 pg   MCHC 35.8 30.0 - 36.0 g/dL   RDW 13.9 11.5 - 15.5 %   Platelets 203 150 - 400 K/uL   Neutrophils Relative % 71 %   Neutro Abs 7.5 1.7 - 7.7 K/uL   Lymphocytes Relative 20 %   Lymphs Abs 2.1 0.7 - 4.0 K/uL   Monocytes Relative 8 %   Monocytes Absolute 0.9 0.1 - 1.0 K/uL   Eosinophils Relative 1 %   Eosinophils Absolute 0.1 0.0 - 0.7 K/uL   Basophils Relative 0 %   Basophils Absolute 0.0 0.0 - 0.1 K/uL    Imaging:  NA  MAU Course: CBC with differential, Tylenol.  MDM: Pregnant female with tender mass behind her right nipple 2 days. Likely clogged duct or early mastitis, but with absence of fever or leukocytosis will not prescribe anabiotics at this time.   Assessment: 1. Breast pain during pregnancy   2. Breast mass, right    Plan: Discharge home in stable condition.  Mastitis precautions Labor precautions and fetal kick counts Rx Ultram for pain.     Follow-up Information    Follow up with [facility] On 08/21/2015.   Specialty:  Obstetrics and Gynecology   Why:  at 3:00 pm for reevaluation of breast mass   Contact information:   [place] [phone]      Follow up with [facility].   Why:  As needed if symptoms worsen (Fever>100.4, redness, swelling, severe pain)   Contact information:   [address] [id number] [place] [phone]        Medication List    TAKE these medications        acetaminophen 500 MG tablet  Commonly known as:  TYLENOL  Take 1,000 mg by mouth every 6 (six) hours as needed for headache.  AEROCHAMBER PLUS inhaler  Use as instructed     albuterol 108 (90 BASE) MCG/ACT inhaler  Commonly known as:  PROVENTIL HFA;VENTOLIN HFA  Inhale 2 puffs into the lungs every 6 (six) hours as needed for wheezing or shortness of breath. Use spacer     [employer]S SALINE NOSE SPRAY 0.65 % nasal spray  Generic drug:  sodium chloride  PLACE 1 SPRAY INTO BOTH NOSTRILS AS NEEDED FOR CONGESTION.     hydrocortisone-pramoxine rectal foam  Commonly known as:  PROCTOFOAM HC  Place 1 applicator rectally 2 (two) times daily.     prenatal multivitamin Tabs tablet  Take 1 tablet by mouth daily.     traMADol 50 MG tablet  Commonly known as:  ULTRAM  Take 1-2 tablets (50-100 mg total) by mouth every 6 (six) hours as needed for severe pain.     TUCKS 50 % Pads  Apply 1 application topically 2 (two) times daily as needed.        [place], CNM 08/19/2015 5:30 AM

## 2015-08-19 NOTE — Progress Notes (Signed)
Ok to d/c efm per Marlou Porch CNM

## 2015-08-19 NOTE — MAU Note (Signed)
Pt reports having increased breast pain on the right. Stated she hasfelt a lump there for a few day.s told it could be a clogged milk duct. Used warm comppress without relief.  infact she said it made it worse.

## 2015-08-21 ENCOUNTER — Encounter: Payer: Medicaid Other | Admitting: Obstetrics & Gynecology

## 2015-08-21 ENCOUNTER — Ambulatory Visit (INDEPENDENT_AMBULATORY_CARE_PROVIDER_SITE_OTHER): Payer: Medicaid Other | Admitting: Obstetrics & Gynecology

## 2015-08-21 ENCOUNTER — Other Ambulatory Visit: Payer: Self-pay | Admitting: Obstetrics & Gynecology

## 2015-08-21 VITALS — BP 134/85 | HR 90 | Temp 98.8°F | Wt 232.1 lb

## 2015-08-21 DIAGNOSIS — O09293 Supervision of pregnancy with other poor reproductive or obstetric history, third trimester: Secondary | ICD-10-CM

## 2015-08-21 DIAGNOSIS — N644 Mastodynia: Secondary | ICD-10-CM

## 2015-08-21 DIAGNOSIS — N63 Unspecified lump in breast: Secondary | ICD-10-CM | POA: Diagnosis not present

## 2015-08-21 DIAGNOSIS — N6310 Unspecified lump in the right breast, unspecified quadrant: Secondary | ICD-10-CM

## 2015-08-21 LAB — POCT URINALYSIS DIP (DEVICE)
BILIRUBIN URINE: NEGATIVE
Glucose, UA: NEGATIVE mg/dL
KETONES UR: NEGATIVE mg/dL
NITRITE: NEGATIVE
Protein, ur: NEGATIVE mg/dL
Specific Gravity, Urine: 1.015 (ref 1.005–1.030)
Urobilinogen, UA: 1 mg/dL (ref 0.0–1.0)
pH: 7 (ref 5.0–8.0)

## 2015-08-21 NOTE — Progress Notes (Signed)
Here today for referral from MAU for evaluation of breast pain. Had Induction of labor scheduled for Wednesday 08/23/15. Moderate leukocytes noted on urinalysis.

## 2015-08-21 NOTE — Progress Notes (Signed)
Subjective:right breast pain and subareolar lump  Kim Johns is a 24 y.o. G2P0100 at [redacted]w[redacted]d being seen today for ongoing prenatal care.  Patient reports breast pain and lump.  Contractions: Not present.  Vag. Bleeding: None. Movement: Present. Denies leaking of fluid.   The following portions of the patient's history were reviewed and updated as appropriate: allergies, current medications, past family history, past medical history, past social history, past surgical history and problem list.   Objective:   Filed Vitals:   08/21/15 1322  BP: 134/85  Pulse: 90  Temp: 98.8 F (37.1 C)  Weight: 232 lb 1.6 oz (105.28 kg)    Fetal Status: Fetal Heart Rate (bpm): 132   Movement: Present     General:  Alert, oriented and cooperative. Patient is in no acute distress.  Skin: Skin is warm and dry. No rash noted.   Cardiovascular: Normal heart rate noted  Respiratory: Normal respiratory effort, no problems with respiration noted  Abdomen: Soft, gravid, appropriate for gestational age. Pain/Pressure: Present     Pelvic: Vag. Bleeding: None     Cervical exam deferred        Extremities: Normal range of motion.  Edema: None  Mental Status: Normal mood and affect. Normal behavior. Normal judgment and thought content.  2x3 cm tender lump subareolar on right no d/c Urinalysis:      Assessment and Plan:  Pregnancy: G2P0100 at [redacted]w[redacted]d  1. Painful lumpy right breast Needs w/u, MAU note 08/19/15 reviewed - US BREAST COMPLETE UNI RIGHT INC AXILLA; Future  2. History of preterm premature rupture of membranes (PROM) in previous pregnancy, currently pregnant in third trimester IOL at 41 weeks  Term labor symptoms and general obstetric precautions including but not limited to vaginal bleeding, contractions, leaking of fluid and fetal movement were reviewed in detail with the patient. Please refer to After Visit Summary for other counseling recommendations.  Return if symptoms worsen or fail to  improve.   Woodroe Mode, MD

## 2015-08-22 ENCOUNTER — Inpatient Hospital Stay: Admission: RE | Admit: 2015-08-22 | Payer: Medicaid Other | Source: Ambulatory Visit

## 2015-08-22 ENCOUNTER — Ambulatory Visit
Admission: RE | Admit: 2015-08-22 | Discharge: 2015-08-22 | Disposition: A | Discharge status: Home / Self Care | Payer: Medicaid Other | Source: Ambulatory Visit | Attending: Obstetrics & Gynecology | Admitting: Obstetrics & Gynecology

## 2015-08-22 ENCOUNTER — Other Ambulatory Visit: Payer: Self-pay | Admitting: Obstetrics & Gynecology

## 2015-08-22 ENCOUNTER — Other Ambulatory Visit: Payer: Self-pay

## 2015-08-22 ENCOUNTER — Telehealth: Payer: Self-pay | Admitting: *Deleted

## 2015-08-22 ENCOUNTER — Other Ambulatory Visit: Payer: Self-pay | Admitting: *Deleted

## 2015-08-22 DIAGNOSIS — N6310 Unspecified lump in the right breast, unspecified quadrant: Secondary | ICD-10-CM

## 2015-08-22 DIAGNOSIS — IMO0002 Reserved for concepts with insufficient information to code with codable children: Secondary | ICD-10-CM

## 2015-08-22 DIAGNOSIS — N644 Mastodynia: Principal | ICD-10-CM

## 2015-08-22 DIAGNOSIS — R229 Localized swelling, mass and lump, unspecified: Principal | ICD-10-CM

## 2015-08-22 NOTE — Addendum Note (Signed)
Addended by: Riccardo Dubin on: 08/22/2015 09:21 AM   Modules accepted: Orders

## 2015-08-22 NOTE — Telephone Encounter (Signed)
Pt called and stated that she is going for a breast ultrasound today but has noticed that she has a bump on her breast now. I advised her to keep appointment at breast center, and typically they call our doctor for orders if further intervention is needed. Patient voiced understanding and had no further questions.

## 2015-08-23 ENCOUNTER — Encounter (HOSPITAL_COMMUNITY): Payer: Self-pay

## 2015-08-23 ENCOUNTER — Inpatient Hospital Stay (HOSPITAL_COMMUNITY)
Admission: RE | Admit: 2015-08-23 | Payer: Medicaid Other | Source: Ambulatory Visit | Attending: Advanced Practice Midwife | Admitting: Advanced Practice Midwife

## 2015-08-23 ENCOUNTER — Inpatient Hospital Stay (HOSPITAL_COMMUNITY)
Admission: AD | Admit: 2015-08-23 | Discharge: 2015-08-25 | DRG: 774 | Disposition: A | Discharge status: Home / Self Care | Payer: Medicaid Other | Source: Ambulatory Visit | Attending: Obstetrics and Gynecology | Admitting: Obstetrics and Gynecology

## 2015-08-23 ENCOUNTER — Inpatient Hospital Stay (HOSPITAL_COMMUNITY): Payer: Medicaid Other | Admitting: Anesthesiology

## 2015-08-23 DIAGNOSIS — O480 Post-term pregnancy: Secondary | ICD-10-CM | POA: Diagnosis present

## 2015-08-23 DIAGNOSIS — Z87891 Personal history of nicotine dependence: Secondary | ICD-10-CM

## 2015-08-23 DIAGNOSIS — O99824 Streptococcus B carrier state complicating childbirth: Secondary | ICD-10-CM | POA: Diagnosis not present

## 2015-08-23 DIAGNOSIS — Z6841 Body Mass Index (BMI) 40.0 and over, adult: Secondary | ICD-10-CM

## 2015-08-23 DIAGNOSIS — Z3A41 41 weeks gestation of pregnancy: Secondary | ICD-10-CM | POA: Diagnosis not present

## 2015-08-23 DIAGNOSIS — O9952 Diseases of the respiratory system complicating childbirth: Secondary | ICD-10-CM | POA: Diagnosis present

## 2015-08-23 DIAGNOSIS — O411230 Chorioamnionitis, third trimester, not applicable or unspecified: Secondary | ICD-10-CM | POA: Diagnosis present

## 2015-08-23 DIAGNOSIS — O4290 Premature rupture of membranes, unspecified as to length of time between rupture and onset of labor, unspecified weeks of gestation: Secondary | ICD-10-CM | POA: Insufficient documentation

## 2015-08-23 DIAGNOSIS — O3433 Maternal care for cervical incompetence, third trimester: Secondary | ICD-10-CM | POA: Diagnosis present

## 2015-08-23 DIAGNOSIS — O9112 Abscess of breast associated with the puerperium: Secondary | ICD-10-CM | POA: Diagnosis not present

## 2015-08-23 DIAGNOSIS — Z8249 Family history of ischemic heart disease and other diseases of the circulatory system: Secondary | ICD-10-CM | POA: Diagnosis not present

## 2015-08-23 DIAGNOSIS — O4292 Full-term premature rupture of membranes, unspecified as to length of time between rupture and onset of labor: Principal | ICD-10-CM | POA: Diagnosis present

## 2015-08-23 DIAGNOSIS — Z833 Family history of diabetes mellitus: Secondary | ICD-10-CM

## 2015-08-23 DIAGNOSIS — N611 Abscess of the breast and nipple: Secondary | ICD-10-CM | POA: Diagnosis present

## 2015-08-23 DIAGNOSIS — Z823 Family history of stroke: Secondary | ICD-10-CM

## 2015-08-23 DIAGNOSIS — J45909 Unspecified asthma, uncomplicated: Secondary | ICD-10-CM | POA: Diagnosis present

## 2015-08-23 DIAGNOSIS — O99214 Obesity complicating childbirth: Secondary | ICD-10-CM | POA: Diagnosis present

## 2015-08-23 LAB — RPR: RPR: NONREACTIVE

## 2015-08-23 LAB — CBC
HCT: 35.7 % — ABNORMAL LOW (ref 36.0–46.0)
Hemoglobin: 12.6 g/dL (ref 12.0–15.0)
MCH: 32.8 pg (ref 26.0–34.0)
MCHC: 35.3 g/dL (ref 30.0–36.0)
MCV: 93 fL (ref 78.0–100.0)
PLATELETS: 211 10*3/uL (ref 150–400)
RBC: 3.84 MIL/uL — AB (ref 3.87–5.11)
RDW: 13.9 % (ref 11.5–15.5)
WBC: 9.4 10*3/uL (ref 4.0–10.5)

## 2015-08-23 LAB — POCT FERN TEST: POCT Fern Test: POSITIVE

## 2015-08-23 LAB — TYPE AND SCREEN
ABO/RH(D): O POS
Antibody Screen: NEGATIVE

## 2015-08-23 LAB — HEPATITIS B SURFACE ANTIGEN: HEP B S AG: NEGATIVE

## 2015-08-23 MED ORDER — LACTATED RINGERS IV SOLN
500.0000 mL | INTRAVENOUS | Status: DC | PRN
  Administered 2015-08-23 (×2): 500 mL via INTRAVENOUS
  Administered 2015-08-23: 1000 mL via INTRAVENOUS
  Administered 2015-08-23: 500 mL via INTRAVENOUS

## 2015-08-23 MED ORDER — ONDANSETRON HCL 4 MG/2ML IJ SOLN: 4.0000 mg | Freq: Four times a day (QID) | INTRAMUSCULAR | Status: DC | PRN

## 2015-08-23 MED ORDER — OXYTOCIN 40 UNITS IN LACTATED RINGERS INFUSION - SIMPLE MED
1.0000 m[IU]/min | INTRAVENOUS | Status: DC
  Administered 2015-08-23: 2 m[IU]/min via INTRAVENOUS
  Filled 2015-08-23: qty 1000

## 2015-08-23 MED ORDER — GENTAMICIN SULFATE 40 MG/ML IJ SOLN
170.0000 mg | Freq: Three times a day (TID) | INTRAVENOUS | Status: DC
  Administered 2015-08-23: 170 mg via INTRAVENOUS
  Filled 2015-08-23 (×3): qty 4.25

## 2015-08-23 MED ORDER — ACETAMINOPHEN 325 MG PO TABS: 650.0000 mg | ORAL_TABLET | ORAL | Status: DC | PRN

## 2015-08-23 MED ORDER — CITRIC ACID-SODIUM CITRATE 334-500 MG/5ML PO SOLN
30.0000 mL | ORAL | Status: DC | PRN
  Administered 2015-08-23 (×2): 30 mL via ORAL
  Filled 2015-08-23 (×2): qty 15

## 2015-08-23 MED ORDER — PENICILLIN G POTASSIUM 5000000 UNITS IJ SOLR
5.0000 10*6.[IU] | Freq: Once | INTRAVENOUS | Status: AC
  Administered 2015-08-23: 5 10*6.[IU] via INTRAVENOUS
  Filled 2015-08-23: qty 5

## 2015-08-23 MED ORDER — LIDOCAINE HCL (PF) 1 % IJ SOLN
INTRAMUSCULAR | Status: DC | PRN
  Administered 2015-08-23 (×2): 8 mL via EPIDURAL

## 2015-08-23 MED ORDER — PHENYLEPHRINE 40 MCG/ML (10ML) SYRINGE FOR IV PUSH (FOR BLOOD PRESSURE SUPPORT)
80.0000 ug | PREFILLED_SYRINGE | INTRAVENOUS | Status: DC | PRN
  Filled 2015-08-23: qty 20

## 2015-08-23 MED ORDER — SODIUM CHLORIDE 0.9 % IV SOLN
2.0000 g | Freq: Four times a day (QID) | INTRAVENOUS | Status: DC
  Administered 2015-08-23: 2 g via INTRAVENOUS
  Filled 2015-08-23 (×4): qty 2000

## 2015-08-23 MED ORDER — FENTANYL CITRATE (PF) 100 MCG/2ML IJ SOLN: 50.0000 ug | INTRAMUSCULAR | Status: DC | PRN

## 2015-08-23 MED ORDER — EPHEDRINE 5 MG/ML INJ: 10.0000 mg | INTRAVENOUS | Status: DC | PRN

## 2015-08-23 MED ORDER — DEXTROSE 5 % IV SOLN
600.0000 mg | Freq: Three times a day (TID) | INTRAVENOUS | Status: DC
  Administered 2015-08-23 – 2015-08-24 (×3): 600 mg via INTRAVENOUS
  Filled 2015-08-23 (×4): qty 4

## 2015-08-23 MED ORDER — PENICILLIN G POTASSIUM 5000000 UNITS IJ SOLR
2.5000 10*6.[IU] | INTRAVENOUS | Status: DC
  Administered 2015-08-23 (×4): 2.5 10*6.[IU] via INTRAVENOUS
  Filled 2015-08-23 (×6): qty 2.5

## 2015-08-23 MED ORDER — DIPHENHYDRAMINE HCL 50 MG/ML IJ SOLN
12.5000 mg | INTRAMUSCULAR | Status: DC | PRN
  Administered 2015-08-23: 12.5 mg via INTRAVENOUS
  Filled 2015-08-23: qty 1

## 2015-08-23 MED ORDER — CLINDAMYCIN PHOSPHATE 600 MG/50ML IV SOLN
600.0000 mg | Freq: Three times a day (TID) | INTRAVENOUS | Status: DC
  Filled 2015-08-23 (×2): qty 50

## 2015-08-23 MED ORDER — ACETAMINOPHEN 500 MG PO TABS
1000.0000 mg | ORAL_TABLET | Freq: Once | ORAL | Status: AC
  Administered 2015-08-23: 1000 mg via ORAL
  Filled 2015-08-23: qty 2

## 2015-08-23 MED ORDER — OXYTOCIN BOLUS FROM INFUSION
500.0000 mL | INTRAVENOUS | Status: DC
  Administered 2015-08-23: 500 mL via INTRAVENOUS

## 2015-08-23 MED ORDER — FENTANYL 2.5 MCG/ML BUPIVACAINE 1/10 % EPIDURAL INFUSION (WH - ANES)
14.0000 mL/h | INTRAMUSCULAR | Status: DC | PRN
  Administered 2015-08-23 (×4): 14 mL/h via EPIDURAL
  Filled 2015-08-23 (×4): qty 125

## 2015-08-23 MED ORDER — TERBUTALINE SULFATE 1 MG/ML IJ SOLN: 0.2500 mg | Freq: Once | INTRAMUSCULAR | Status: DC | PRN

## 2015-08-23 MED ORDER — LACTATED RINGERS IV SOLN
INTRAVENOUS | Status: DC
  Administered 2015-08-23 (×2): via INTRAVENOUS

## 2015-08-23 MED ORDER — LIDOCAINE HCL (PF) 1 % IJ SOLN
30.0000 mL | INTRAMUSCULAR | Status: DC | PRN
  Filled 2015-08-23: qty 30

## 2015-08-23 MED ORDER — OXYCODONE-ACETAMINOPHEN 5-325 MG PO TABS: 2.0000 | ORAL_TABLET | ORAL | Status: DC | PRN

## 2015-08-23 MED ORDER — OXYTOCIN 40 UNITS IN LACTATED RINGERS INFUSION - SIMPLE MED
62.5000 mL/h | INTRAVENOUS | Status: DC
  Administered 2015-08-24: 62.5 mL/h via INTRAVENOUS

## 2015-08-23 MED ORDER — OXYCODONE-ACETAMINOPHEN 5-325 MG PO TABS: 1.0000 | ORAL_TABLET | ORAL | Status: DC | PRN

## 2015-08-23 NOTE — Progress Notes (Signed)
Labor Progress Note Kim Johns is a 24 y.o. G2P0100 at [redacted]w[redacted]d presented for ROM S: comfortable. Right breast is still TTP.  O:  BP 103/63 mmHg  Pulse 93  Temp(Src) 98.3 F (36.8 C) (Oral)  Resp 20  Ht 5\' 2"  (1.575 m)  Wt 232 lb (105.235 kg)  BMI 42.42 kg/m2  SpO2 100%  LMP 11/09/2014 EFM: 130/Mod/+accels, no decels  CVE: Dilation: 4.5 Effacement (%): 80 Cervical Position: Middle Station: -2 Presentation: Vertex Exam by:: TWillis RNC  Skin: right breast with fluctuant masses at 3 and 9 o'clock    A&P: 24 y.o. G2P0100 [redacted]w[redacted]d here with PROM with onset of labor #Labor: progressing without augmentation. Continue expectant managment #Pain: Epidural in place #FWB: Cat I, EFW #GBS: Positive, on PCN for prophylaxsis  #Breast Abscess: spoke with Gen Surg (Will Creig Hines) regarding imaging and to help with surgical management. Discussed concern that there is at least external compression of breast duct that is preventing passage of colostrum (which was happening prior and pt continues to have on left). Right breast had [place] and showed fluid (2cm) in the retroareolar space that communicates with superficial areas.  - Will start Clindamycin for MRSA coverage though this may not be MRSA as this is better coverage for abscess. - Consulted General Surgery - Will consider I&D if indicated and agreed to by Gen Surg- plan to culture fluid if drained.   Caren Macadam, MD 9:29 AM

## 2015-08-23 NOTE — MAU Note (Signed)
Pt reports ROM at 0000, some contractions.

## 2015-08-23 NOTE — H&P (Signed)
LABOR ADMISSION HISTORY AND PHYSICAL  Kim Johns is a 24 y.o. female G2P0100 with IUP at [redacted]w[redacted]d by LMP + 5 wk [place] presenting for IOL for PROM and postdates pregnancy. She reports +FM, + contractions, No LOF, no VB, no blurry vision, headaches or peripheral edema, and RUQ pain.  She plans on breast feeding. She requests Nexplanon for birth control.  Dating: By LMP + 5 wk [place] --->  Estimated Date of Delivery: 08/16/15  Sono:    @[redacted]w[redacted]d , CWD, normal anatomy, cephalic presentation, longitudinal lie, 3151g, 60% EFW  @[redacted]w[redacted]d , CWD, normal anatomy, cephalic presentation, longitudinal lie, 248g, 43% EFW    Prenatal History/Complications:   Clinic [facility] Prenatal Labs  Dating LMP consistent with 5 week ultrasound Blood type: --/--/O POS (01/16 1620)   Genetic Screen 1 Screen:  Unable to measure NT   Quad:     Antibody:NEG (07/24 0305)  Anatomic [place] Normal, limited views of heart; Boy Rubella: 4.81 (04/06 1133)  GTT Early:               Third trimester: 121 RPR: NON REAC (04/06 1133)   Flu vaccine declines HBsAg: NEGATIVE (04/06 1133)   TDaP vaccine       05/15/15     HIV: NON-REACTIVE (04/06 1133)   GBS   POSITIVE           WUJ:[id number] (07/24 0000)  Contraception nexplanon Pap: neg 03/2014  Baby Food Breast   Circumcision Desires, outpt   Pediatrician Hasn't decided   Support Person FOB, jonathan      Past Medical History: Past Medical History  Diagnosis Date  . Allergy   . Asthma     albuterol last used a few months ago  . Preterm labor     Past Surgical History: Past Surgical History  Procedure Laterality Date  . Wisdom tooth extraction    . Cervical cerclage N/A 04/05/2015    Procedure: CERCLAGE CERVICAL;  Surgeon: Lavonia Drafts, MD;  Location: [facility];  Service: Gynecology;  Laterality: N/A;    Obstetrical History: OB History    Gravida Para Term Preterm AB TAB SAB Ectopic Multiple Living   2 1 0 1 0 0 0 0 0 0       Social History: Social History   Social History   . Marital Status: Single    Spouse Name: N/A  . Number of Children: N/A  . Years of Education: N/A   Social History Main Topics  . Smoking status: Former Smoker -- 0.50 packs/day for 10 years    Types: Cigarettes  . Smokeless tobacco: Never Used  . Alcohol Use: No  . Drug Use: No  . Sexual Activity: Not Currently    Birth Control/ Protection: None   Other Topics Concern  . None   Social History Narrative    Family History: Family History  Problem Relation Age of Onset  . Diabetes Mother   . Stroke Mother   . Hypertension Father     Allergies: Allergies  Allergen Reactions  . Shellfish Allergy Hives  . Sulfa Antibiotics Other (See Comments)    Reaction:  Unknown; childhood reaction  . Icy Hot Rash    Prescriptions prior to admission  Medication Sig Dispense Refill Last Dose  . acetaminophen (TYLENOL) 500 MG tablet Take 1,000 mg by mouth every 6 (six) hours as needed for headache.   08/22/2015 at Unknown time  . albuterol (PROVENTIL HFA;VENTOLIN HFA) 108 (90 BASE) MCG/ACT inhaler Inhale 2 puffs into the lungs every  6 (six) hours as needed for wheezing or shortness of breath. Use spacer 1 Inhaler 2 Past Month at Unknown time  . Prenatal Vit-Fe Fumarate-FA (PRENATAL MULTIVITAMIN) TABS tablet Take 1 tablet by mouth daily.   08/22/2015 at Unknown time  . traMADol (ULTRAM) 50 MG tablet Take 1-2 tablets (50-100 mg total) by mouth every 6 (six) hours as needed for severe pain. 30 tablet 0 08/22/2015 at Unknown time  . Witch Hazel (TUCKS) 50 % PADS Apply 1 application topically 2 (two) times daily as needed. 1 each 3 Past Month at Unknown time  . [employer] SALINE NOSE SPRAY 0.65 % nasal spray PLACE 1 SPRAY INTO BOTH NOSTRILS AS NEEDED FOR CONGESTION.  3 More than a month at Unknown time  . hydrocortisone-pramoxine (PROCTOFOAM HC) rectal foam Place 1 applicator rectally 2 (two) times daily. 10 g 0 Unknown at Unknown time  . Spacer/Aero-Holding Chambers (AEROCHAMBER PLUS) inhaler Use as  instructed 1 each 2 More than a month at Unknown time     Review of Systems   All systems reviewed and negative except as stated in HPI  BP 130/89 mmHg  Pulse 78  Temp(Src) 97.8 F (36.6 C) (Oral)  Resp 20  Ht 5\' 2"  (1.575 m)  Wt 232 lb (105.235 kg)  BMI 42.42 kg/m2  SpO2 100%  LMP 11/09/2014 General appearance: alert, cooperative and no distress Lungs: non-labored breathing Heart: regular rate  Abdomen: soft, non-tender Extremities: no sign of DVT, 1+ edema Presentation: cephalic Fetal monitoringBaseline: 125 bpm, Variability: Good {> 6 bpm) and Accelerations: Reactive Uterine activityFrequency: Every 2-3 minutes, Duration: 60-90 seconds and Intensity: mild Dilation: 4 Effacement (%): 70 Station: -3 Exam by:: D Simpson RN   Prenatal Transfer Tool  Maternal Diabetes: No Genetic Screening: Normal Maternal Ultrasounds/Referrals: Normal Fetal Ultrasounds or other Referrals:  None Maternal Substance Abuse:  No Significant Maternal Medications:  None Significant Maternal Lab Results: Lab values include: Group B Strep positive  Results for orders placed or performed during the hospital encounter of 08/23/15 (from the past 24 hour(s))  CBC   Collection Time: 08/23/15  1:40 AM  Result Value Ref Range   WBC 9.4 4.0 - 10.5 K/uL   RBC 3.84 (L) 3.87 - 5.11 MIL/uL   Hemoglobin 12.6 12.0 - 15.0 g/dL   HCT 35.7 (L) 36.0 - 46.0 %   MCV 93.0 78.0 - 100.0 fL   MCH 32.8 26.0 - 34.0 pg   MCHC 35.3 30.0 - 36.0 g/dL   RDW 13.9 11.5 - 15.5 %   Platelets 211 150 - 400 K/uL  Type and screen   Collection Time: 08/23/15  1:40 AM  Result Value Ref Range   ABO/RH(D) O POS    Antibody Screen NEG    Sample Expiration 08/26/2015   Fern Test   Collection Time: 08/23/15  1:59 AM  Result Value Ref Range   POCT Fern Test Positive = ruptured amniotic membanes     Patient Active Problem List   Diagnosis Date Noted  . History of preterm delivery   . History of intrauterine fetal death,  currently pregnant   . Cervical insufficiency in pregnancy, antepartum   . Supervision of high risk pregnancy in third trimester 12/26/2014  . History of preterm premature rupture of membranes (PROM) in previous pregnancy, currently pregnant in third trimester 06/17/2014  . Obese 03/30/2014    Assessment: Kim Johns is a 24 y.o. G2P0100 at [redacted]w[redacted]d here for IOL for PROM and post-dates pregnancy.  #Labor:Begin pitocin #Pain: Epidural  if desired in active labor #FWB: Cat 1 #ID:  GBS pos - PCN #MOF: Breast #MOC:Nexplanon #Circ:  Yes, outpatient  [name], MD PGY-1 [facility]    ATTESTATION:  Seen and examined by me also Agree with note  Filed Vitals:   08/23/15 0631 08/23/15 0701 08/23/15 0730 08/23/15 0800  BP: 108/60 114/94 102/56 114/72  Pulse: 77 79 88 117  Temp:      TempSrc:      Resp:      Height:      Weight:      SpO2:        Fetal heart rate reactive, Category I UCs somewhat irregular .Cervix 4cm  Will proceed with Pitocin for induction of labor Seabron Spates, CNM

## 2015-08-23 NOTE — Progress Notes (Signed)
ANTIBIOTIC CONSULT NOTE - INITIAL  Pharmacy Consult for Gentamicin Indication: Maternal temp during labor  Allergies  Allergen Reactions  . Shellfish Allergy Hives  . Sulfa Antibiotics Other (See Comments)    Reaction:  Unknown; childhood reaction  . Icy Hot Rash    Patient Measurements: Height: 5\' 2"  (157.5 cm) Weight: 232 lb (105.235 kg) IBW/kg (Calculated) : 50.1 Adjusted Body Weight: 66.6kg  Vital Signs: Temp: 99.6 F (37.6 C) (09/28 1830) Temp Source: Oral (09/28 1830) BP: 116/74 mmHg (09/28 1900) Pulse Rate: 98 (09/28 1900)     Labs:  Recent Labs  08/23/15 0140  WBC 9.4  HGB 12.6  PLT 211   Estimated SCr=0.68 with estimated CrCl ~ 133ml/min CrCl cannot be calculated (Patient has no serum creatinine result on file.). No results for input(s): VANCOTROUGH, VANCOPEAK, VANCORANDOM, GENTTROUGH, GENTPEAK, GENTRANDOM, TOBRATROUGH, TOBRAPEAK, TOBRARND, AMIKACINPEAK, AMIKACINTROU, AMIKACIN in the last 72 hours.   Microbiology: Recent Results (from the past 720 hour(s))  Culture, OB Urine     Status: None   Collection Time: 08/09/15  1:48 PM  Result Value Ref Range Status   Colony Count NO GROWTH  Final   Organism ID, Bacteria NO GROWTH  Final    Medical History: Past Medical History  Diagnosis Date  . Allergy   . Asthma     albuterol last used a few months ago  . Preterm labor     Medications:  Penicillin protocol for GBS+  9/28 (discontinued) Clindamycin 600mg  IV q8h for breast abscess Ampicillin 2 gram IV q6h Assessment: 24yo F admitted for IOL due to postdate. Pt started earlier today on Penicillin protocol for GBS and Clindamycin IV for breast abscess based on ID recommendation. Pt now has developed maternal temp during labor.  Goal of Therapy:  Gentamicin peaks 6-64mcg/ml and trough < 17mcg/ml  Plan:  1. Gentamicin 170mg  IV q8h. 2. Will draw SCr if Gentamicin continued. 3. Will continue to follow and assess clinical need for further  follow-up. Thanks!  Kim Johns 08/23/2015,7:20 PM

## 2015-08-23 NOTE — Anesthesia Preprocedure Evaluation (Signed)
Anesthesia Evaluation  Patient identified by MRN, date of birth, ID band Patient awake    Reviewed: Allergy & Precautions, H&P , NPO status , Patient's Chart, lab work & pertinent test results  Airway Mallampati: II  TM Distance: >3 FB Neck ROM: full    Dental no notable dental hx.    Pulmonary former smoker,    Pulmonary exam normal        Cardiovascular negative cardio ROS Normal cardiovascular exam     Neuro/Psych negative neurological ROS  negative psych ROS   GI/Hepatic negative GI ROS, Neg liver ROS,   Endo/Other  Morbid obesity  Renal/GU negative Renal ROS     Musculoskeletal   Abdominal (+) + obese,   Peds  Hematology negative hematology ROS (+)   Anesthesia Other Findings   Reproductive/Obstetrics (+) Pregnancy                             Anesthesia Physical Anesthesia Plan  ASA: III  Anesthesia Plan: Epidural   Post-op Pain Management:    Induction:   Airway Management Planned:   Additional Equipment:   Intra-op Plan:   Post-operative Plan:   Informed Consent: I have reviewed the patients History and Physical, chart, labs and discussed the procedure including the risks, benefits and alternatives for the proposed anesthesia with the patient or authorized representative who has indicated his/her understanding and acceptance.     Plan Discussed with:   Anesthesia Plan Comments:         Anesthesia Quick Evaluation

## 2015-08-23 NOTE — Procedures (Signed)
Time out performed. Patient verbally consented for Incision/Drainage of superficial fluid collection.   Area was cleaned with chlorhexidine thoroughly x 4. After cleaning right fluid collection started draining.   Using 25 gauge needle I aspirated approximately 1 cc of purulent fluid from fluid collection on the right side of nipple. Injected 1 cc of lidocaine into each fluid collection which increased spontaneous drainage. Using 11 blade I gently extended the opening to allow purulent fluid to drain. Incision ~40mm bilaterally. Copious purulent drainage was expressed. Defect/abscess pocket 1 cm on right and 0.68mm on left. Iodoform guaze was packed into the cavity of each area to allow for continued drainage. Dry gauze was placed over wound.  Cultures were collected. Patient tolerated procedure well.   Caren Macadam, MD

## 2015-08-23 NOTE — Anesthesia Procedure Notes (Signed)
Epidural Patient location during procedure: OB Start time: 08/23/2015 3:39 AM End time: 08/23/2015 3:43 AM  Staffing Anesthesiologist: Lyn Hollingshead Performed by: anesthesiologist   Preanesthetic Checklist Completed: patient identified, surgical consent, pre-op evaluation, timeout performed, IV checked, risks and benefits discussed and monitors and equipment checked  Epidural Patient position: sitting Prep: site prepped and draped and DuraPrep Patient monitoring: continuous pulse ox and blood pressure Approach: midline Location: L3-L4 Injection technique: LOR air  Needle:  Needle type: Tuohy  Needle gauge: 17 G Needle length: 9 cm and 9 Needle insertion depth: 7 cm Catheter type: closed end flexible Catheter size: 19 Gauge Catheter at skin depth: 12 cm Test dose: negative and Other  Assessment Sensory level: T9 Events: blood not aspirated, injection not painful, no injection resistance, negative IV test and no paresthesia  Additional Notes Reason for block:procedure for pain

## 2015-08-24 ENCOUNTER — Encounter (HOSPITAL_COMMUNITY): Payer: Self-pay | Admitting: *Deleted

## 2015-08-24 DIAGNOSIS — Z3A41 41 weeks gestation of pregnancy: Secondary | ICD-10-CM

## 2015-08-24 DIAGNOSIS — O99824 Streptococcus B carrier state complicating childbirth: Secondary | ICD-10-CM

## 2015-08-24 MED ORDER — OXYCODONE-ACETAMINOPHEN 5-325 MG PO TABS
1.0000 | ORAL_TABLET | ORAL | Status: DC | PRN
Start: 2015-08-24 — End: 2015-08-25

## 2015-08-24 MED ORDER — BENZOCAINE-MENTHOL 20-0.5 % EX AERO
1.0000 "application " | INHALATION_SPRAY | CUTANEOUS | Status: DC | PRN
  Administered 2015-08-24: 1 via TOPICAL
  Filled 2015-08-24: qty 56

## 2015-08-24 MED ORDER — LANOLIN HYDROUS EX OINT: TOPICAL_OINTMENT | CUTANEOUS | Status: DC | PRN

## 2015-08-24 MED ORDER — ACETAMINOPHEN 325 MG PO TABS: 650.0000 mg | ORAL_TABLET | ORAL | Status: DC | PRN

## 2015-08-24 MED ORDER — SENNOSIDES-DOCUSATE SODIUM 8.6-50 MG PO TABS: 2.0000 | ORAL_TABLET | ORAL | Status: DC

## 2015-08-24 MED ORDER — OXYCODONE-ACETAMINOPHEN 5-325 MG PO TABS
2.0000 | ORAL_TABLET | ORAL | Status: DC | PRN
Start: 2015-08-24 — End: 2015-08-25

## 2015-08-24 MED ORDER — ONDANSETRON HCL 4 MG PO TABS: 4.0000 mg | ORAL_TABLET | ORAL | Status: DC | PRN

## 2015-08-24 MED ORDER — CLINDAMYCIN HCL 300 MG PO CAPS
300.0000 mg | ORAL_CAPSULE | Freq: Three times a day (TID) | ORAL | Status: DC
  Administered 2015-08-24 – 2015-08-25 (×4): 300 mg via ORAL
  Filled 2015-08-24 (×8): qty 1

## 2015-08-24 MED ORDER — DIBUCAINE 1 % RE OINT
1.0000 "application " | TOPICAL_OINTMENT | RECTAL | Status: DC | PRN
  Administered 2015-08-24: 1 via RECTAL
  Filled 2015-08-24: qty 28

## 2015-08-24 MED ORDER — WITCH HAZEL-GLYCERIN EX PADS
1.0000 "application " | MEDICATED_PAD | CUTANEOUS | Status: DC | PRN
  Administered 2015-08-24: 1 via TOPICAL

## 2015-08-24 MED ORDER — ONDANSETRON HCL 4 MG/2ML IJ SOLN: 4.0000 mg | INTRAMUSCULAR | Status: DC | PRN

## 2015-08-24 MED ORDER — CLINDAMYCIN HCL 300 MG PO CAPS
600.0000 mg | ORAL_CAPSULE | Freq: Three times a day (TID) | ORAL | Status: DC
  Filled 2015-08-24 (×2): qty 2

## 2015-08-24 MED ORDER — TETANUS-DIPHTH-ACELL PERTUSSIS 5-2.5-18.5 LF-MCG/0.5 IM SUSP: 0.5000 mL | Freq: Once | INTRAMUSCULAR | Status: DC

## 2015-08-24 MED ORDER — IBUPROFEN 600 MG PO TABS
600.0000 mg | ORAL_TABLET | Freq: Four times a day (QID) | ORAL | Status: DC
  Administered 2015-08-24 – 2015-08-25 (×7): 600 mg via ORAL
  Filled 2015-08-24 (×7): qty 1

## 2015-08-24 MED ORDER — PRENATAL MULTIVITAMIN CH
1.0000 | ORAL_TABLET | Freq: Every day | ORAL | Status: DC
  Administered 2015-08-24 – 2015-08-25 (×2): 1 via ORAL
  Filled 2015-08-24 (×2): qty 1

## 2015-08-24 MED ORDER — ZOLPIDEM TARTRATE 5 MG PO TABS: 5.0000 mg | ORAL_TABLET | Freq: Every evening | ORAL | Status: DC | PRN

## 2015-08-24 MED ORDER — SIMETHICONE 80 MG PO CHEW: 80.0000 mg | CHEWABLE_TABLET | ORAL | Status: DC | PRN

## 2015-08-24 MED ORDER — DIPHENHYDRAMINE HCL 25 MG PO CAPS: 25.0000 mg | ORAL_CAPSULE | Freq: Four times a day (QID) | ORAL | Status: DC | PRN

## 2015-08-24 NOTE — Progress Notes (Signed)
UR chart review completed.  

## 2015-08-24 NOTE — Anesthesia Postprocedure Evaluation (Signed)
  Anesthesia Post-op Note  Patient: Kim Johns  Procedure(s) Performed: * No procedures listed *  Patient Location: PACU and Mother/Baby  Anesthesia Type:Epidural  Level of Consciousness: awake, alert  and oriented  Airway and Oxygen Therapy: Patient Spontanous Breathing  Post-op Pain: none  Post-op Assessment: Post-op Vital signs reviewed, Patient's Cardiovascular Status Stable, No headache, No backache, No residual numbness and No residual motor weakness  Post-op Vital Signs: Reviewed and stable  Complications: No apparent anesthesia complications

## 2015-08-24 NOTE — Progress Notes (Signed)
Post Partum Day 1 S/p I&D Day 1  Subjective: Kim Johns is a 24 y.o. G2P1101 [redacted]w[redacted]d s/p SVD and s/p breast abscess I&D. No acute events overnight and has no complaints. Pt denies problems voiding or po intake. She denies nausea or vomiting.Pain is well controlled. She has had flatus. She has not had bowel movement. Lochia Moderate ("like a heavy period"). Plan for birth control is patch or Nexplanon. Method of Feeding: Breast. Pt denies any significant drainage overnight from site of R breast I&D. Reports that staining on dressing is from skin-to-skin with child immediately after delivery.  Objective: Blood pressure 121/54, pulse 82, temperature 98.7 F (37.1 C), temperature source Oral, resp. rate 18, height 5\' 2"  (1.575 m), weight 105.235 kg (232 lb), last menstrual period 11/09/2014, SpO2 100 %, unknown if currently breastfeeding.  Physical Exam:  General: alert, cooperative and no distress Lochia: appropriate: heavy period Uterine Fundus: firm Breast: dressing at site dry, stained red. No erythema. DVT Evaluation: No evidence of DVT seen on physical exam.   Recent Labs  08/23/15 0140  HGB 12.6  HCT 35.7*    Assessment/Plan: Kim Johns is a 24 year old G59P1101 [redacted]w[redacted]d s/p SVD and s/p breast abscess I&D.  Plan for discharge tomorrow Breastfeeding Discuss birth control options: currently deciding between Nexplanon and patch Schedule f/u with surgery for possible further treatment of breast abscess F/u wound culture   LOS: 1 day   Chelsea Perfect 08/24/2015, 6:36 AM   OB fellow attestation Post Partum Day 1 I have seen and examined this patient and agree with above documentation in the medical student's note.   Kim Johns is a 24 y.o. G2P1101 s/p NSVD.  Pt denies problems with ambulating, voiding or po intake. Pain is well controlled.  Plan for birth control is Nexplanon, possibly patch but is undecided.  Method of Feeding: Breast, complicated by right  breast abscess.   PE:  BP 96/56 mmHg  Pulse 69  Temp(Src) 97.8 F (36.6 C) (Oral)  Resp 18  Ht 5\' 2"  (1.575 m)  Wt 232 lb (105.235 kg)  BMI 42.42 kg/m2  SpO2 100%  LMP 11/09/2014  Breastfeeding? Unknown Gen: well appearing Breast: Right areas is no longer TTP, breast bud behind aerola but no fluctuant mass.  Heart: reg rate Lungs: normal WOB Fundus firm Ext: soft, no pain, no edema  Plan for discharge: PPD#2  #Postpartum care: routine care  #Right breast abscess: s/p I&D on 9/28 currently on IV Clinda for empiric MRSA coverage.  - d/c IV clinda -Start PO Clinda- plan for 7 day course - follow up at [place] for dry guaze of nipple on right. No need to pack with iodoform gauze. Able to shower normally, light washing.   Kim Macadam, MD 3:38 PM

## 2015-08-24 NOTE — Lactation Note (Signed)
This note was copied from the chart of Kim Johns. Lactation Consultation Note  Patient Name: Kim Johns [id number] Date: 08/24/2015 Reason for consult: Initial assessment;Other (Comment);Breast surgery (breast abscess on the right breast both sides of the nipples exised after delivery)  Baby is 72 hours old - has been to the breast on the left breast only several times since birth. HNV , one stool, per mom when the baby latches on the left it is comfortable. Per mom the packing was removed from the abscess site. Mom denies any discomfort or pain.  Due to the location of the excised abscess LC suggested not breast feeding on the right breast or no pumping , due to both irritating the sites and prolonging healing. Due to breast feeding on the left side the milk will start letting down on the right and leaking into the effected areas which would enhance healing. Components of Breast milk. LC stressed the importance of being gentle when hand expressing and keeping her hand several inches from the areola so the effected tissue isn't being stressed. If her sterile dressing ends up saturated , to have RN change it. Actually gentle hand expressing will promote the infection to drain. Discuss this with mom. Mother informed of post-discharge support and given phone number to the lactation department, including services for phone call assistance; out-patient appointments; and breastfeeding support group. List of other breastfeeding resources in the community given in the handout. Encouraged mother to call for problems or concerns related to breastfeeding. As for breast feeding on the left - prior to latching - breast massage , hand express, pre - pump with hand pump 8-10 strokes to prime the milk ducts so baby gets more volume at feeding Also latch with breast compressions until swallows, and then intermittent.  LC discussed with mom due to breast feeding on only one breast may need to supplement  with EBM , or formula. @ present baby has been satisfied at the breast , per mom.       Maternal Data Has patient been taught Hand Expression?: Yes Does the patient have breastfeeding experience prior to this delivery?: No  Feeding Feeding Type:  (baby recently fed on the left breast ) Length of feed: 10 min  LATCH Score/Interventions Latch: Repeated attempts needed to sustain latch, nipple held in mouth throughout feeding, stimulation needed to elicit sucking reflex. Intervention(s): Skin to skin;Teach feeding cues;Waking techniques Intervention(s): Adjust position;Assist with latch;Breast compression;Breast massage  Audible Swallowing: A few with stimulation Intervention(s): Skin to skin;Hand expression Intervention(s): Skin to skin;Hand expression  Type of Nipple: Everted at rest and after stimulation  Comfort (Breast/Nipple): Soft / non-tender     Hold (Positioning): Assistance needed to correctly position infant at breast and maintain latch. Intervention(s): Breastfeeding basics reviewed  LATCH Score: 7  Lactation Tools Discussed/Used WIC Program: Yes (per mom [place] )   Consult Status Consult Status: Follow-up Date: 08/25/15 Follow-up type: In-patient    Myer Haff 08/24/2015, 5:57 PM

## 2015-08-25 ENCOUNTER — Ambulatory Visit: Payer: Self-pay

## 2015-08-25 DIAGNOSIS — N611 Abscess of the breast and nipple: Secondary | ICD-10-CM | POA: Diagnosis present

## 2015-08-25 MED ORDER — OXYCODONE-ACETAMINOPHEN 5-325 MG PO TABS
1.0000 | ORAL_TABLET | ORAL | Status: DC | PRN
Start: 2015-08-25 — End: 2015-10-26

## 2015-08-25 MED ORDER — IBUPROFEN 600 MG PO TABS: 600.0000 mg | ORAL_TABLET | Freq: Four times a day (QID) | ORAL | Status: DC

## 2015-08-25 MED ORDER — MEASLES, MUMPS & RUBELLA VAC ~~LOC~~ INJ
0.5000 mL | INJECTION | Freq: Once | SUBCUTANEOUS | Status: AC
  Administered 2015-08-25: 0.5 mL via SUBCUTANEOUS
  Filled 2015-08-25: qty 0.5

## 2015-08-25 MED ORDER — CLINDAMYCIN HCL 300 MG PO CAPS: 300.0000 mg | ORAL_CAPSULE | Freq: Three times a day (TID) | ORAL | Status: DC

## 2015-08-25 NOTE — Discharge Summary (Signed)
OB Discharge Summary     Patient Name: Kim Johns DOB: February 13, 1991 MRN: [id number]  Date of admission: 08/23/2015 Delivering MD: Verner Mould   Date of discharge: 08/25/2015  Admitting diagnosis: 52 WEEKS ROM Intrauterine pregnancy: [redacted]w[redacted]d     Secondary diagnosis: Right Breast Abscess     Discharge diagnosis: Term Pregnancy Delivered and Right breast abscess                                                                                                Post partum procedures:None  Augmentation: Pitocin  Complications: Intrauterine Inflammation or infection (Chorioamniotis)  Fever During Labor, treated with Amp/Gent  Hospital course:  Induction of Labor With Vaginal Delivery   24 y.o. yo G2P1101 at [redacted]w[redacted]d was admitted to the hospital 08/23/2015 for induction of labor.  Indication for induction: Postdates.  Patient had a labor course remarkable for : Membrane Rupture Time/Date: 12:00 AM ,08/23/2015   Intrapartum Procedures: Episiotomy: None [1]                                         Lacerations:  2nd degree [3];Perineal [11]   She had an intrapartum fever treated with Amp/gent  Expand All Collapse All   Patient is 24 y.o. G2P0100 [redacted]w[redacted]d admitted for PROM.   Delivery Note At 11:27 PM a viable female was delivered via Vaginal, Spontaneous Delivery (Presentation: Occiput Anterior). APGAR: 7, 8; weight 8 lb 0.6 oz (3646 g).  Placenta status: Intact, Spontaneous. Cord: 3 vessels with the following complications: nuchal x1.   Anesthesia: Epidural  Episiotomy: None Lacerations: 2nd degree perineal Suture Repair: 3.0 vicryl Est. Blood Loss (mL): 100  Mom to postpartum. Baby to Couplet care / Skin to Skin.  [name], MD PGY-1       I&D of right breast abscesses was done by Dr Ernestina Patches (see her note for details) Samples were sent for culture, currently no organisms seen.  Patient had delivery of a Viable infant.  Information for the patient's  newborn:  Kim, Johns [[id number]]  Delivery Method: Vaginal, Spontaneous Delivery (Filed from Delivery Summary)   08/23/2015  Details of delivery can be found in separate delivery note.   08/24/15      Patient had a routine postpartum course. She was seen on PP Day #1 and packing was removed from breast.  It was draining a small amount of clear drainage.  She was breastfeeding on left only per recommendation of Lactation consultant.     I reconsulted Lactation on date of discharge.  She will evaluate patient and reformulate plan. She and I agree that there should be some kind of milk expression done on right breast, and she will evaluate the feasibility of doing that, given the open wounds.  We will need to monitor her for mastitis/engorgement on that side.  Fundus was firm and lochia within normal limits.  08/25/15   Patient is discharged home with routine instructions and will keep a light dressing on right breast  to catch drainage. She will continue Clindamycin for 10 days.  Lactation will arrange followup with them next week and Dr Ernestina Patches will see her next week for evaluation of incisions.    Physical exam  Filed Vitals:   08/24/15 0140 08/24/15 0305 08/24/15 0640 08/25/15 0605  BP:  121/54 96/56 121/69  Pulse:  82 69 66  Temp: 98.8 F (37.1 C) 98.7 F (37.1 C) 97.8 F (36.6 C) 98.1 F (36.7 C)  TempSrc: Oral Oral Oral Oral  Resp:  18 18 18   Height:      Weight:      SpO2:       General: alert, cooperative and no distress Lochia: appropriate Uterine Fundus: firm Incision: Healing well with no significant drainage DVT Evaluation: No evidence of DVT seen on physical exam.  Labs: Lab Results  Component Value Date   WBC 9.4 08/23/2015   HGB 12.6 08/23/2015   HCT 35.7* 08/23/2015   MCV 93.0 08/23/2015   PLT 211 08/23/2015   CMP Latest Ref Rng 09/07/2014  Glucose 70 - 99 mg/dL 88  BUN 6 - 23 mg/dL 9  Creatinine 0.50 - 1.10 mg/dL 0.91  Sodium 137 - 147 mEq/L 138   Potassium 3.7 - 5.3 mEq/L 4.4  Chloride 96 - 112 mEq/L 104  CO2 19 - 32 mEq/L 20  Calcium 8.4 - 10.5 mg/dL 9.7  Total Protein 6.0 - 8.3 g/dL 8.2  Total Bilirubin 0.3 - 1.2 mg/dL 0.4  Alkaline Phos 39 - 117 U/L 44  AST 0 - 37 U/L 18  ALT 0 - 35 U/L 13    Discharge instruction: per After Visit Summary and "Baby and Me Booklet".  Medications:  Current facility-administered medications:  .  acetaminophen (TYLENOL) tablet 650 mg, 650 mg, Oral, Q4H PRN, Verner Mould, MD .  benzocaine-Menthol (DERMOPLAST) 20-0.5 % topical spray 1 application, 1 application, Topical, PRN, Verner Mould, MD, 1 application at 67/12/45 0203 .  clindamycin (CLEOCIN) capsule 300 mg, 300 mg, Oral, 3 times per day, Verner Mould, MD, 300 mg at 08/25/15 0555 .  witch hazel-glycerin (TUCKS) pad 1 application, 1 application, Topical, PRN, 1 application at 80/99/83 0203 **AND** dibucaine (NUPERCAINAL) 1 % rectal ointment 1 application, 1 application, Rectal, PRN, Verner Mould, MD, 1 application at 38/25/05 0205 .  diphenhydrAMINE (BENADRYL) capsule 25 mg, 25 mg, Oral, Q6H PRN, Verner Mould, MD .  ibuprofen (ADVIL,MOTRIN) tablet 600 mg, 600 mg, Oral, 4 times per day, Verner Mould, MD, 600 mg at 08/25/15 0555 .  lanolin ointment, , Topical, PRN, Verner Mould, MD .  ondansetron [facility]) tablet 4 mg, 4 mg, Oral, Q4H PRN **OR** ondansetron (ZOFRAN) injection 4 mg, 4 mg, Intravenous, Q4H PRN, Verner Mould, MD .  oxyCODONE-acetaminophen (PERCOCET/ROXICET) 5-325 MG per tablet 1 tablet, 1 tablet, Oral, Q4H PRN, Verner Mould, MD .  oxyCODONE-acetaminophen (PERCOCET/ROXICET) 5-325 MG per tablet 2 tablet, 2 tablet, Oral, Q4H PRN, Verner Mould, MD .  prenatal multivitamin tablet 1 tablet, 1 tablet, Oral, Q1200, Verner Mould, MD, 1 tablet at 08/24/15 1244 .  senna-docusate (Senokot-S) tablet 2 tablet, 2  tablet, Oral, Q24H, Verner Mould, MD, 2 tablet at 08/25/15 0000 .  simethicone (MYLICON) chewable tablet 80 mg, 80 mg, Oral, PRN, Verner Mould, MD .  Tdap [facility]) injection 0.5 mL, 0.5 mL, Intramuscular, Once, Verner Mould, MD, 0.5 mL at 08/25/15 0747 .  zolpidem (AMBIEN) tablet 5 mg, 5 mg, Oral, QHS PRN,  Verner Mould, MD  Facility-Administered Medications Ordered in Other Encounters:  .  lidocaine (PF) (XYLOCAINE) 1 % injection, , , Anesthesia Intra-op, Lyn Hollingshead, MD, 8 mL at 08/23/15 0343  Diet: routine diet  Activity: Advance as tolerated. Pelvic rest for 6 weeks.   Outpatient follow up:1 week  Postpartum contraception: Nexplanon or patch  Newborn Data: Live born female  Birth Weight: 8 lb 0.6 oz (3646 g) APGAR: 7, 8  Baby Feeding: Breast Disposition:home with mother   08/25/2015 Hansel Feinstein, CNM

## 2015-08-25 NOTE — Lactation Note (Signed)
This note was copied from the chart of Kim Johns. Lactation Consultation Note  Patient Name: Kim Johns [id number] Date: 08/25/2015 Reason for consult: Follow-up assessment  Mom s/p I&D drainage of 2 abscesses at base of her R nipple. Mom is currently taking antibx (clindamycin-L2).  Mom is not interested in feeding from R breast. Mom shown how to do hand-expression on L breast. Mom complained that pumping was uncomfortable (despite trying different flange sizes). Mom observed pumping w/Vaseline on the inside of the flange, which made Mom more comfortable. A size 24 flange is an appropriate size for Mom at this time.  Mom had an IUFD last year at 82 weeks. She did become engorged when her milk came in. Since Mom has no interest in feeding from the R breast, here is the plan: 1. Do not stimulate R breast. Wear cabbage leaves on R breast, but do not place over the dressing. Only wear cabbage leaves when dressing is present over I&D sites.  2. When changing dressing, she can use provided saline flushes to flush away any pus or breast milk from I&D sites. 3. Change cabbage leaves when wilted; remove the base of the cabbage leaf and crush "veins" of cabbage.  4. Stop using cabbage leaves if skin irritation occurs. Mom reports a childhood allergy of sulfa, but she is unsure if this allergy is still present.  Mom aware that continuously wearing cabbage leaves on R breast could cause her R breast to not produce very much (and there is also no guarantee that it will make much difference). Mom is ok if the former occurs and is fine w/just pumping/feeding from left breast.   Matthias Hughs [facility] 08/25/2015, 2:24 PM

## 2015-08-25 NOTE — Discharge Instructions (Signed)
Breastfeeding and Mastitis °Mastitis is inflammation of the breast tissue. It can occur in women who are breastfeeding. This can make breastfeeding painful. Mastitis will sometimes go away on its own. Your health care provider will help determine if treatment is needed. °CAUSES °Mastitis is often associated with a blocked milk (lactiferous) duct. This can happen when too much milk builds up in the breast. Causes of excess milk in the breast can include: °· Poor latch-on. If your baby is not latched onto the breast properly, she or he may not empty your breast completely while breastfeeding. °· Allowing too much time to pass between feedings. °· Wearing a bra or other clothing that is too tight. This puts extra pressure on the lactiferous ducts so milk does not flow through them as it should. °Mastitis can also be caused by a bacterial infection. Bacteria may enter the breast tissue through cuts or openings in the skin. In women who are breastfeeding, this may occur because of cracked or irritated skin. Cracks in the skin are often caused when your baby does not latch on properly to the breast. °SIGNS AND SYMPTOMS °· Swelling, redness, tenderness, and pain in an area of the breast. °· Swelling of the glands under the arm on the same side. °· Fever may or may not accompany mastitis. °If an infection is allowed to progress, a collection of pus (abscess) may develop. °DIAGNOSIS  °Your health care provider can usually diagnose mastitis based on your symptoms and a physical exam. Tests may be done to help confirm the diagnosis. These may include: °· Removal of pus from the breast by applying pressure to the area. This pus can be examined in the lab to determine which bacteria are present. If an abscess has developed, the fluid in the abscess can be removed with a needle. This can also be used to confirm the diagnosis and determine the bacteria present. In most cases, pus will not be present. °· Blood tests to determine if  your body is fighting a bacterial infection. °· Mammogram or ultrasound tests to rule out other problems or diseases. °TREATMENT  °Mastitis that occurs with breastfeeding will sometimes go away on its own. Your health care provider may choose to wait 24 hours after first seeing you to decide whether a prescription medicine is needed. If your symptoms are worse after 24 hours, your health care provider will likely prescribe an antibiotic medicine to treat the mastitis. He or she will determine which bacteria are most likely causing the infection and will then select an appropriate antibiotic medicine. This is sometimes changed based on the results of tests performed to identify the bacteria, or if there is no response to the antibiotic medicine selected. Antibiotic medicines are usually given by mouth. You may also be given medicine for pain. °HOME CARE INSTRUCTIONS °· Only take over-the-counter or prescription medicines for pain, fever, or discomfort as directed by your health care provider. °· If your health care provider prescribed an antibiotic medicine, take the medicine as directed. Make sure you finish it even if you start to feel better. °· Do not wear a tight or underwire bra. Wear a soft, supportive bra. °· Increase your fluid intake, especially if you have a fever. °· Continue to empty the breast. Your health care provider can tell you whether this milk is safe for your infant or needs to be thrown out. You may be told to stop nursing until your health care provider thinks it is safe for your baby.   Use a breast pump if you are advised to stop nursing.  Keep your nipples clean and dry.  Empty the first breast completely before going to the other breast. If your baby is not emptying your breasts completely for some reason, use a breast pump to empty your breasts.  If you go back to work, pump your breasts while at work to stay in time with your nursing schedule.  Avoid allowing your breasts to become  overly filled with milk (engorged). SEEK MEDICAL CARE IF:  You have pus-like discharge from the breast.  Your symptoms do not improve with the treatment prescribed by your health care provider within 2 days. SEEK IMMEDIATE MEDICAL CARE IF:  Your pain and swelling are getting worse.  You have pain that is not controlled with medicine.  You have a red line extending from the breast toward your armpit.  You have a fever or persistent symptoms for more than 2-3 days.  You have a fever and your symptoms suddenly get worse. MAKE SURE YOU:   Understand these instructions.  Will watch your condition.  Will get help right away if you are not doing well or get worse. Document Released: 03/08/2005 Document Revised: 11/16/2013 Document Reviewed: 06/17/2013 [facility] Patient Information 2015 [place], [place]. This information is not intended to replace advice given to you by your health care provider. Make sure you discuss any questions you have with your health care provider. Postpartum Care After Vaginal Delivery After you deliver your newborn (postpartum period), the usual stay in the hospital is 24-72 hours. If there were problems with your labor or delivery, or if you have other medical problems, you might be in the hospital longer.  While you are in the hospital, you will receive help and instructions on how to care for yourself and your newborn during the postpartum period.  While you are in the hospital:  Be sure to tell your nurses if you have pain or discomfort, as well as where you feel the pain and what makes the pain worse.  If you had an incision made near your vagina (episiotomy) or if you had some tearing during delivery, the nurses may put ice packs on your episiotomy or tear. The ice packs may help to reduce the pain and swelling.  If you are breastfeeding, you may feel uncomfortable contractions of your uterus for a couple of weeks. This is normal. The contractions help your  uterus get back to normal size.  It is normal to have some bleeding after delivery.  For the first 1-3 days after delivery, the flow is red and the amount may be similar to a period.  It is common for the flow to start and stop.  In the first few days, you may pass some small clots. Let your nurses know if you begin to pass large clots or your flow increases.  Do not  flush blood clots down the toilet before having the nurse look at them.  During the next 3-10 days after delivery, your flow should become more watery and pink or brown-tinged in color.  Ten to fourteen days after delivery, your flow should be a small amount of yellowish-white discharge.  The amount of your flow will decrease over the first few weeks after delivery. Your flow may stop in 6-8 weeks. Most women have had their flow stop by 12 weeks after delivery.  You should change your sanitary pads frequently.  Wash your hands thoroughly with soap and water for at least  20 seconds after changing pads, using the toilet, or before holding or feeding your newborn.  You should feel like you need to empty your bladder within the first 6-8 hours after delivery.  In case you become weak, lightheaded, or faint, call your nurse before you get out of bed for the first time and before you take a shower for the first time.  Within the first few days after delivery, your breasts may begin to feel tender and full. This is called engorgement. Breast tenderness usually goes away within 48-72 hours after engorgement occurs. You may also notice milk leaking from your breasts. If you are not breastfeeding, do not stimulate your breasts. Breast stimulation can make your breasts produce more milk.  Spending as much time as possible with your newborn is very important. During this time, you and your newborn can feel close and get to know each other. Having your newborn stay in your room (rooming in) will help to strengthen the bond with your  newborn. It will give you time to get to know your newborn and become comfortable caring for your newborn.  Your hormones change after delivery. Sometimes the hormone changes can temporarily cause you to feel sad or tearful. These feelings should not last more than a few days. If these feelings last longer than that, you should talk to your caregiver.  If desired, talk to your caregiver about methods of family planning or contraception.  Talk to your caregiver about immunizations. Your caregiver may want you to have the following immunizations before leaving the hospital:  Tetanus, diphtheria, and pertussis (Tdap) or tetanus and diphtheria (Td) immunization. It is very important that you and your family (including grandparents) or others caring for your newborn are up-to-date with the Tdap or Td immunizations. The Tdap or Td immunization can help protect your newborn from getting ill.  Rubella immunization.  Varicella (chickenpox) immunization.  Influenza immunization. You should receive this annual immunization if you did not receive the immunization during your pregnancy. Document Released: 09/08/2007 Document Revised: 08/05/2012 Document Reviewed: 07/08/2012 [facility] Patient Information 2015 [place], [place]. This information is not intended to replace advice given to you by your health care provider. Make sure you discuss any questions you have with your health care provider.

## 2015-08-26 ENCOUNTER — Ambulatory Visit: Payer: Self-pay

## 2015-08-26 LAB — WOUND CULTURE
Culture: NO GROWTH
Special Requests: NORMAL

## 2015-08-26 NOTE — Lactation Note (Signed)
This note was copied from the chart of Kim Johns. Lactation Consultation Note  Patient Name: Kim Johns [id number] Date: 08/26/2015    Mom's breasts do feel heavier.  The cabbage leaves have not caused any skin irritation for Mom & they have also not diminished her milk from coming in on the R side. Hand expression technique reviewed on L breast so she can hand express when her R breast becomes uncomfortable. Mom made aware not to wait for engorgement to occur prior to doing hand expression on her R side.   Sxs of mastitis discussed. Mom has an appt on Thursday. Mom given extra saline flushes & dressings. Mom knows to continue with her IB and antibx & to keep I&D sites covered if using the cabbage leaves.   Matthias Hughs [facility] 08/26/2015, 11:17 AM

## 2015-08-28 ENCOUNTER — Encounter (HOSPITAL_COMMUNITY): Payer: Self-pay

## 2015-08-28 ENCOUNTER — Inpatient Hospital Stay (HOSPITAL_COMMUNITY)
Admission: AD | Admit: 2015-08-28 | Discharge: 2015-08-28 | Disposition: A | Discharge status: Home / Self Care | Payer: Medicaid Other | Source: Ambulatory Visit | Attending: Family Medicine | Admitting: Family Medicine

## 2015-08-28 DIAGNOSIS — J45909 Unspecified asthma, uncomplicated: Secondary | ICD-10-CM | POA: Diagnosis not present

## 2015-08-28 DIAGNOSIS — O9089 Other complications of the puerperium, not elsewhere classified: Secondary | ICD-10-CM | POA: Diagnosis not present

## 2015-08-28 DIAGNOSIS — Z87891 Personal history of nicotine dependence: Secondary | ICD-10-CM | POA: Insufficient documentation

## 2015-08-28 DIAGNOSIS — R102 Pelvic and perineal pain: Secondary | ICD-10-CM | POA: Diagnosis present

## 2015-08-28 LAB — ANAEROBIC CULTURE: SPECIAL REQUESTS: NORMAL

## 2015-08-28 NOTE — MAU Provider Note (Signed)
History     CSN: [id number]  Arrival date and time: 08/28/15 2032   First Provider Initiated Contact with Patient 08/28/15 2110      No chief complaint on file.  HPI Comments: Kim Johns is a 24 y.o who is S/P NSVD with perineal pain today. She has not taken anything for pain since about 1300 today.   Vaginal Pain Primary symptoms comment: vaginal pain . This is a new problem. The current episode started today. The problem occurs constantly. The problem has been unchanged. Pain severity now: 8/10  She is not pregnant. Pertinent negatives include no abdominal pain, dysuria, fever, frequency or urgency. Nothing aggravates the symptoms. She has tried nothing for the symptoms. The treatment provided no relief.    Past Medical History  Diagnosis Date  . Allergy   . Asthma     albuterol last used a few months ago  . Preterm labor     Past Surgical History  Procedure Laterality Date  . Wisdom tooth extraction    . Cervical cerclage N/A 04/05/2015    Procedure: CERCLAGE CERVICAL;  Surgeon: Lavonia Drafts, MD;  Location: [facility];  Service: Gynecology;  Laterality: N/A;    Family History  Problem Relation Age of Onset  . Diabetes Mother   . Stroke Mother   . Hypertension Father     Social History  Substance Use Topics  . Smoking status: Former Smoker -- 0.50 packs/day for 10 years    Types: Cigarettes  . Smokeless tobacco: Never Used  . Alcohol Use: No    Allergies:  Allergies  Allergen Reactions  . Shellfish Allergy Hives  . Sulfa Antibiotics Other (See Comments)    Reaction:  Unknown; childhood reaction  . Icy Hot Rash    Prescriptions prior to admission  Medication Sig Dispense Refill Last Dose  . ibuprofen (ADVIL,MOTRIN) 600 MG tablet Take 1 tablet (600 mg total) by mouth every 6 (six) hours. 30 tablet 0 08/28/2015 at 1400  . acetaminophen (TYLENOL) 500 MG tablet Take 1,000 mg by mouth every 6 (six) hours as needed for headache.   08/22/2015 at Unknown  time  . albuterol (PROVENTIL HFA;VENTOLIN HFA) 108 (90 BASE) MCG/ACT inhaler Inhale 2 puffs into the lungs every 6 (six) hours as needed for wheezing or shortness of breath. Use spacer 1 Inhaler 2 Past Month at Unknown time  . clindamycin (CLEOCIN) 300 MG capsule Take 1 capsule (300 mg total) by mouth every 8 (eight) hours. 30 capsule 0   . oxyCODONE-acetaminophen (PERCOCET/ROXICET) 5-325 MG tablet Take 1 tablet by mouth every 4 (four) hours as needed (for pain scale 4-7). 20 tablet 0   . Prenatal Vit-Fe Fumarate-FA (PRENATAL MULTIVITAMIN) TABS tablet Take 1 tablet by mouth daily.   Past Week at Unknown time  . traMADol (ULTRAM) 50 MG tablet Take 1-2 tablets (50-100 mg total) by mouth every 6 (six) hours as needed for severe pain. 30 tablet 0 08/22/2015 at Unknown time    Review of Systems  Constitutional: Negative for fever.  Gastrointestinal: Negative for abdominal pain.  Genitourinary: Positive for vaginal pain. Negative for dysuria, urgency and frequency.   Physical Exam   Blood pressure 147/87, pulse 82, temperature 98.5 F (36.9 C), temperature source Oral, resp. rate 18, height 5\' 2"  (1.575 m), weight 102.059 kg (225 lb), SpO2 100 %, unknown if currently breastfeeding.  Physical Exam  Nursing note and vitals reviewed. Constitutional: She is oriented to person, place, and time. She appears well-developed and well-nourished. No distress.  HENT:  Head: Normocephalic.  Cardiovascular: Normal rate.   Respiratory: Effort normal.  GI: Soft. There is no tenderness.  Genitourinary:  Perineal tear is intact, and healing well.  Rectum: with small hemorrhoid   Neurological: She is alert and oriented to person, place, and time.  Skin: Skin is warm and dry.  Psychiatric: She has a normal mood and affect.    MAU Course  Procedures  MDM   Assessment and Plan   1. Postpartum perineal pain    DC home Comfort measures reviewed  Ice packs to bottom today Heat/Ice alternating tomorrow   Ibuprofen on schedule. RX: none.  Return to MAU as needed FU with OB as planned  Follow-up Information    Follow up with [facility].   Specialty:  Obstetrics and Gynecology   Why:  As scheduled   Contact information:   [place] [phone]        Mathis Bud 08/28/2015, 9:18 PM

## 2015-08-28 NOTE — MAU Note (Deleted)
Pt reports really bad cramping for 2 days and a lot of pressure today. Denies vaginal bleeding.

## 2015-08-28 NOTE — Discharge Instructions (Signed)

## 2015-08-28 NOTE — MAU Note (Signed)
Pt states she had a vaginal delivery on Thursday and she had stitches and today she has been having a lot more pain in her vaginal area.

## 2015-08-28 NOTE — MAU Note (Signed)
Kim Johns CNM okay to discharge pt with last BP. Pt has clinic appt on Thursday and to keep that appt for follow up

## 2015-08-31 ENCOUNTER — Encounter: Payer: Self-pay | Admitting: Obstetrics & Gynecology

## 2015-08-31 ENCOUNTER — Ambulatory Visit (INDEPENDENT_AMBULATORY_CARE_PROVIDER_SITE_OTHER): Payer: Medicaid Other | Admitting: Obstetrics & Gynecology

## 2015-08-31 VITALS — BP 139/94 | HR 68 | Temp 98.7°F | Wt 215.6 lb

## 2015-08-31 DIAGNOSIS — N611 Abscess of the breast and nipple: Secondary | ICD-10-CM

## 2015-08-31 DIAGNOSIS — B372 Candidiasis of skin and nail: Secondary | ICD-10-CM

## 2015-08-31 MED ORDER — FLUCONAZOLE 150 MG PO TABS: 150.0000 mg | ORAL_TABLET | Freq: Once | ORAL | Status: DC

## 2015-08-31 MED ORDER — NYSTATIN 100000 UNIT/GM EX CREA: 1.0000 "application " | TOPICAL_CREAM | Freq: Two times a day (BID) | CUTANEOUS | Status: DC

## 2015-08-31 NOTE — Progress Notes (Signed)
CLINIC ENCOUNTER NOTE  History:  24 y.o. [id number] here today for follow up I&D of right breast abscess during her intrapartum course; had SVD on 08/24/15.  She reports that area has healed; is able to express breast milk from her right breast.  Also reports worsening irritating rash on inner thighs and vulva, wants evaluation for this.  No other immediate postpartum concerns.  She denies any abnormal vaginal discharge, pelvic pain or other concerns.   Past Medical History  Diagnosis Date  . Allergy   . Asthma     albuterol last used a few months ago  . Preterm labor   . Abscess of breast     Past Surgical History  Procedure Laterality Date  . Wisdom tooth extraction    . Cervical cerclage N/A 04/05/2015    Procedure: CERCLAGE CERVICAL;  Surgeon: Lavonia Drafts, MD;  Location: [facility];  Service: Gynecology;  Laterality: N/A;    The following portions of the patient's history were reviewed and updated as appropriate: allergies, current medications, past family history, past medical history, past social history, past surgical history and problem list.    Review of Systems:  Pertinent items noted in HPI and remainder of comprehensive ROS otherwise negative.  Objective:  Physical Exam BP 139/94 mmHg  Pulse 68  Temp(Src) 98.7 F (37.1 C)  Wt 215 lb 9.6 oz (97.796 kg)  Breastfeeding? No CONSTITUTIONAL: Well-developed, well-nourished female in no acute distress.  HENT:  Normocephalic, atraumatic, External right and left ear normal. Oropharynx is clear and moist EYES: Conjunctivae and EOM are normal. Pupils are equal, round, and reactive to light. No scleral icterus.  NECK: Normal range of motion, supple, no masses.  Normal thyroid.  SKIN: Skin is warm and dry. No rash noted. Not diaphoretic. No erythema. No pallor. [place]: Alert and oriented to person, place, and time. Normal reflexes, muscle tone coordination. No cranial nerve deficit noted. PSYCHIATRIC: Normal mood and  affect. Normal behavior. Normal judgment and thought content. CARDIOVASCULAR: Normal heart rate noted, regular rhythm RESPIRATORY: Clear to auscultation bilaterally. Effort and breath sounds normal, no problems with respiration noted. BREASTS: Symmetric in size. Healed I&D site on R breast, no erythema, no drainage.  Milk expressed from both breasts without difficulty.  No masses, skin changes, nipple drainage, or lymphadenopathy. ABDOMEN: Soft, normal bowel sounds, no distention noted.  No tenderness, rebound or guarding.  MUSCULOSKELETAL: Normal range of motion. No tenderness.  No cyanosis, clubbing, or edema.  2+ distal pulses. PELVIC: Erythema noted involving bilateral inner thighs and vulva consistent with yeast infection of the vulva and skin.  Otherwise, normal external genitalia.  Labs and Imaging US Breast Ltd Uni Right Inc Axilla  08/23/2015   ADDENDUM REPORT: 08/23/2015 09:17 ADDENDUM: Findings discussed with Dr. Gala Romney on 08/23/2015 at 9 a.m. Per that discussion, Dr. Gala Romney was already aware of the breast ultrasound findings and the plan is for surgical incision and drainage of the presumed subareolar abscess collections. Electronically Signed   By: Franki Cabot M.D.   On: 08/23/2015 09:17  08/23/2015   CLINICAL DATA:  Left breast subareolar pain and associated lump developing after a fairly recent nipple ring removal.  Patient is pregnant with inducement scheduled for tomorrow morning.  EXAM: ULTRASOUND OF THE RIGHT BREAST  COMPARISON:  No prior exams.  FINDINGS: On physical exam, 2 nodular areas are seen on either side of the right nipple.  Targeted ultrasound is performed, showing 2 separate complex mixed-echogenicity collections within the retroareolar left breast. Both  retroareolar collections are predominantly solid or phlegmon-like in appearance with extension towards the skin surface.  The largest collection measures 2.6 x 1.4 x 2 cm. The other collection, nearly abutting, measures  1.8 x 1.4 x 1.7 cm. Neither appears to be a drainable fluid collection.  IMPRESSION: Two separate probably benign complex collections within the retroareolar left breast, predominantly solid or phlegmon-like in appearance, with extension towards the skin surface likely causing the visible nodules on either side of the nipple. Measurements for the collections are given above. I suspect that these are chronic infectious collections although there is no overlying skin induration and patient indicates no history of recent fevers. Old/chronic hematomas or galactoceles are other possibilities.  No drainable fluid collection identified.  RECOMMENDATION: Follow-up ultrasound in 6 weeks unless there is clinical resolution in the interval or if there is definitive surgical treatment in the interval.  Would consider surgical consult as I suspect definitive surgical management will be needed.  I have discussed the findings and recommendations with the patient. Results were also provided in writing at the conclusion of the visit. If applicable, a reminder letter will be sent to the patient regarding the next appointment.  BI-RADS CATEGORY  3: Probably benign finding(s) - short interval follow-up suggested.  Electronically Signed: By: Franki Cabot M.D. On: 08/22/2015 17:26   [place] Mfm Ob Follow Up  08/02/2015   OBSTETRICAL ULTRASOUND: This exam was performed within a [facility]. The OB US report was generated in the [facility] system, and faxed to the ordering physician.   This report is available in the [facility]. See the [facility] Obstetric US report via the Image Link.   Assessment & Plan:  1. Yeast infection of the skin - fluconazole (DIFLUCAN) 150 MG tablet; Take 1 tablet (150 mg total) by mouth once. Can take additional dose three days later if symptoms persist  Dispense: 1 tablet; Refill: 3 - nystatin cream (MYCOSTATIN); Apply 1 application topically 2 (two) times daily.  Dispense: 30 g; Refill: 3  2.  Breast abscess Healed well. Complete antibiotic regimen  Follow up for postpartum exam in about 3-4 weeks Routine preventative health maintenance measures emphasized. Please refer to After Visit Summary for other counseling recommendations.   Total face-to-face time with patient: 15 minutes. Over 50% of encounter was spent on counseling and coordination of care.   Verita Schneiders, MD, [place] Attending Obstetrician & Gynecologist, [place] for [facility]

## 2015-09-06 ENCOUNTER — Ambulatory Visit: Payer: Medicaid Other | Admitting: Family

## 2015-09-06 ENCOUNTER — Telehealth: Payer: Self-pay | Admitting: General Practice

## 2015-09-06 VITALS — BP 125/78 | HR 70 | Temp 98.6°F | Wt 207.4 lb

## 2015-09-06 DIAGNOSIS — N6310 Unspecified lump in the right breast, unspecified quadrant: Secondary | ICD-10-CM

## 2015-09-06 NOTE — Progress Notes (Signed)
   Subjective:    Patient ID: Kim Johns, female    DOB: 1991/11/03, 24 y.o.   MRN: [id number]  HPI Pt is here for check of right breast.  Hx of right breast abscess with I&D.  Also received PO antibiotics and seen by [facility] per patient.  Pt states was told will need surgical consult at [facility] while there.    Seen here in the outpatient clinic for follow-up after I&D on 08/31/15 and site appeared well-healed.  Since that time patient called office on 09/06/15 and reports finishing antibiotics (clindamycin), however the pain has returned and feels hardening on breast again.  Denies fever, body aches, or chills.       Review of Systems  Constitutional: Negative for fever and chills.  Genitourinary: Negative for pelvic pain.  Skin:       Breast pain and/lesion       Objective:   Physical Exam  Constitutional: She is oriented to person, place, and time. She appears well-developed and well-nourished. No distress.  HENT:  Head: Normocephalic and atraumatic.  Neck: Normal range of motion. Neck supple. No thyromegaly present.  Pulmonary/Chest:    Abdominal: Bowel sounds are normal.  Neurological: She is alert and oriented to person, place, and time.  Skin: Skin is warm and dry.        Assessment & Plan:  Right Breast Lesion  Plan: Refer to surgeon ([place])  for evaluation  Gwen Pounds, CNM

## 2015-09-06 NOTE — Telephone Encounter (Signed)
Patient called in to front office stating she has finished her antibiotics. Patient states she was feeling better but now that she hasn't finished the antibiotics, the pain is back and her breast is hard again. Asked patient if she could come in today for work in [place] @ 2. Patient verbalized understanding and states she can do that. Patient had no other questions at this time

## 2015-09-07 ENCOUNTER — Telehealth: Payer: Self-pay | Admitting: *Deleted

## 2015-09-07 NOTE — Telephone Encounter (Signed)
Received message left on nurse line 09/07/15 at 1109.  Patient states she had an ultrasound of her breast abscess and it was lanced at [place].  Patient states she was supposed to be referred to a surgeon for consult.  States she was scheduled for another ultrasound instead.  Requests to be referred to surgeon and called with teh appointment date/time.

## 2015-09-08 ENCOUNTER — Other Ambulatory Visit: Payer: Medicaid Other

## 2015-09-11 NOTE — Telephone Encounter (Signed)
Received message left on nurse line on 09/11/15 at 1208 pm.  Patient states she left a message last week on Thursday and no one has returned her call.  States she was referred to [facility] but needs to be seen by a surgeon for a consult.  Patient states she is frustrated as she has been dealing with this issue for a while.  Requests a referral to a surgeon and a return call.

## 2015-09-11 NOTE — Telephone Encounter (Signed)
Spoke with patient via phone.  States she is still having pain after I&D and finishing antibiotics.  States she was told she would be referred to a surgeon but was instead referred to [facility] and they cannot perform surgery.  [facility].  Appointment scheduled for Friday 09/15/15 at 10 am.  Phoned patient and gave appointment date/time and office address.  Patient states she has regular Medicaid.  Instructed patient to be certain she takes her Medicaid card and a picture ID to the appointment on Friday.  Patient states understanding.

## 2015-09-14 ENCOUNTER — Other Ambulatory Visit: Payer: Self-pay | Admitting: Physician Assistant

## 2015-09-14 DIAGNOSIS — N6310 Unspecified lump in the right breast, unspecified quadrant: Secondary | ICD-10-CM

## 2015-09-15 ENCOUNTER — Ambulatory Visit
Admission: RE | Admit: 2015-09-15 | Discharge: 2015-09-15 | Disposition: A | Discharge status: Home / Self Care | Payer: Medicaid Other | Source: Ambulatory Visit | Attending: Family | Admitting: Family

## 2015-09-15 ENCOUNTER — Ambulatory Visit
Admission: RE | Admit: 2015-09-15 | Discharge: 2015-09-15 | Disposition: A | Discharge status: Home / Self Care | Payer: Medicaid Other | Source: Ambulatory Visit | Attending: Physician Assistant | Admitting: Physician Assistant

## 2015-09-15 DIAGNOSIS — N6310 Unspecified lump in the right breast, unspecified quadrant: Secondary | ICD-10-CM

## 2015-10-02 ENCOUNTER — Encounter: Payer: Self-pay | Admitting: Medical

## 2015-10-02 ENCOUNTER — Ambulatory Visit (INDEPENDENT_AMBULATORY_CARE_PROVIDER_SITE_OTHER): Payer: Medicaid Other | Admitting: Medical

## 2015-10-02 DIAGNOSIS — Z30430 Encounter for insertion of intrauterine contraceptive device: Secondary | ICD-10-CM

## 2015-10-02 DIAGNOSIS — Z3202 Encounter for pregnancy test, result negative: Secondary | ICD-10-CM

## 2015-10-02 LAB — POCT URINALYSIS DIP (DEVICE)
BILIRUBIN URINE: NEGATIVE
GLUCOSE, UA: NEGATIVE mg/dL
HGB URINE DIPSTICK: NEGATIVE
Ketones, ur: NEGATIVE mg/dL
LEUKOCYTES UA: NEGATIVE
NITRITE: NEGATIVE
Protein, ur: NEGATIVE mg/dL
Specific Gravity, Urine: 1.025 (ref 1.005–1.030)
UROBILINOGEN UA: 0.2 mg/dL (ref 0.0–1.0)
pH: 6 (ref 5.0–8.0)

## 2015-10-02 LAB — POCT PREGNANCY, URINE: PREG TEST UR: NEGATIVE

## 2015-10-02 MED ORDER — LEVONORGESTREL 18.6 MCG/DAY IU IUD
INTRAUTERINE_SYSTEM | Freq: Once | INTRAUTERINE | Status: AC
  Administered 2015-10-02: 14:00:00 via INTRAUTERINE

## 2015-10-02 NOTE — Patient Instructions (Signed)

## 2015-10-02 NOTE — Progress Notes (Signed)
Patient ID: Kim Johns, female   DOB: 04-22-1991, 24 y.o.   MRN: [id number] Subjective:     Kim Johns is a 24 y.o. female who presents for a postpartum visit. She is 5 weeks postpartum following a spontaneous vaginal delivery. I have fully reviewed the prenatal and intrapartum course. The delivery was at 27 gestational weeks. Outcome: spontaneous vaginal delivery. Anesthesia: epidural. Postpartum course has been unremarkable. Baby's course has been unremarkable. Baby is feeding by bottle - Similac Advance. Bleeding no bleeding. Bowel function is normal. Bladder function is normal. Patient is not sexually active. Contraception method is None Postpartum depression screening: negative.  The following portions of the patient's history were reviewed and updated as appropriate: allergies, current medications, past family history, past medical history, past social history, past surgical history and problem list.  Review of Systems Pertinent items are noted in HPI.   Objective:    BP 115/60 mmHg  Pulse 72  Temp(Src) 98.5 F (36.9 C)  Ht 5\' 2"  (1.575 m)  Wt 211 lb 14.4 oz (96.117 kg)  BMI 38.75 kg/m2  Breastfeeding? No  General:  alert and cooperative   Breasts:  not performed  Lungs: Normal effort  Heart:  Normal rate  Abdomen: Soft, non-tender   Vulva:  normal  Vagina: normal vagina  Cervix:  multiparous appearance, copious cervical mucus, scant blood  Corpus: normal  Adnexa:  not evaluated  Rectal Exam: Not performed.         Results for orders placed or performed in visit on 10/02/15 (from the past 24 hour(s))  POCT urinalysis dip (device)     Status: None   Collection Time: 10/02/15  1:17 PM  Result Value Ref Range   Glucose, UA NEGATIVE NEGATIVE mg/dL   Bilirubin Urine NEGATIVE NEGATIVE   Ketones, ur NEGATIVE NEGATIVE mg/dL   Specific Gravity, Urine 1.025 1.005 - 1.030   Hgb urine dipstick NEGATIVE NEGATIVE   pH 6.0 5.0 - 8.0   Protein, ur NEGATIVE NEGATIVE mg/dL   Urobilinogen, UA 0.2 0.0 - 1.0 mg/dL   Nitrite NEGATIVE NEGATIVE   Leukocytes, UA NEGATIVE NEGATIVE  Pregnancy, urine POC     Status: None   Collection Time: 10/02/15  1:27 PM  Result Value Ref Range   Preg Test, Ur NEGATIVE NEGATIVE    GYNECOLOGY CLINIC PROCEDURE NOTE  IUD Insertion Procedure Note Patient identified, informed consent performed.  Discussed risks of irregular bleeding, cramping, infection, malpositioning or misplacement of the IUD outside the uterus which may require further procedure such as laparoscopy. Time out was performed.  Urine pregnancy test negative.  Speculum placed in the vagina.  Cervix visualized.  Cleaned with Betadine x 2.  Grasped anteriorly with a single tooth tenaculum.  Uterus sounded to 7 cm.  Mirena IUD placed per manufacturer's recommendations.  Strings trimmed to 3 cm. Tenaculum was removed, good hemostasis noted.  Patient tolerated procedure well.   Patient was given post-procedure instructions.  She was advised to be have backup contraception for one week.  Patient was also asked to check IUD strings periodically and follow up in 4 weeks for IUD check.    Assessment:     Normal postpartum exam. Pap smear not done at today's visit.  last pap smear 01/2014 normal. Next pap smear due 01/2017.  IUD insertion  Plan:    1. Contraception: IUD 2. Follow up in: 4 weeks for string check or as needed.

## 2015-10-05 ENCOUNTER — Other Ambulatory Visit: Payer: Medicaid Other

## 2015-10-10 ENCOUNTER — Telehealth: Payer: Self-pay | Admitting: General Practice

## 2015-10-10 NOTE — Telephone Encounter (Signed)
Patient called into front office stating since yesterday evening she has had very heavy bleeding. Patient states she has completely bled through a maxi pad and a super tampon in less than an hour all through the night. Per chart review, patient had IUD inserted 10/02/15. Recommend patient go to MAU in next couple of hours if bleeding does not slow down. Patient verbalized understanding and had no other questions.

## 2015-10-11 ENCOUNTER — Telehealth: Payer: Self-pay | Admitting: General Practice

## 2015-10-11 ENCOUNTER — Telehealth: Payer: Self-pay | Admitting: *Deleted

## 2015-10-11 DIAGNOSIS — Z975 Presence of (intrauterine) contraceptive device: Principal | ICD-10-CM

## 2015-10-11 DIAGNOSIS — N921 Excessive and frequent menstruation with irregular cycle: Secondary | ICD-10-CM

## 2015-10-11 MED ORDER — NORGESTIMATE-ETH ESTRADIOL 0.25-35 MG-MCG PO TABS: 1.0000 | ORAL_TABLET | Freq: Every day | ORAL | Status: DC

## 2015-10-11 NOTE — Telephone Encounter (Signed)
Called patient stating I am returning her call. Patient states bleeding has not improved. Discussed with patient that we have sent a prescription for birth control pills to her pharmacy which should help with the bleeding pretty quickly. Told patient I see where she has an appt with [place] on 12/5. Told patient that if she is still is unhappy we can remove it that day. Patient verbalized understanding to all and had no questions

## 2015-10-11 NOTE — Telephone Encounter (Signed)
Patient called and left message stating she had her IUD inserted last week and is bleeding heavy. Patient states she doesn't want to give it no month, she wants it out immediately.

## 2015-10-11 NOTE — Telephone Encounter (Signed)
Opened in error

## 2015-10-24 ENCOUNTER — Inpatient Hospital Stay (HOSPITAL_COMMUNITY)
Admission: AD | Admit: 2015-10-24 | Discharge: 2015-10-28 | DRG: 759 | Disposition: A | Discharge status: Home / Self Care | Payer: Medicaid Other | Source: Ambulatory Visit | Attending: Obstetrics & Gynecology | Admitting: Obstetrics & Gynecology

## 2015-10-24 ENCOUNTER — Inpatient Hospital Stay (HOSPITAL_COMMUNITY): Payer: Medicaid Other

## 2015-10-24 ENCOUNTER — Encounter (HOSPITAL_COMMUNITY): Payer: Self-pay | Admitting: *Deleted

## 2015-10-24 DIAGNOSIS — J45909 Unspecified asthma, uncomplicated: Secondary | ICD-10-CM | POA: Diagnosis not present

## 2015-10-24 DIAGNOSIS — N7093 Salpingitis and oophoritis, unspecified: Secondary | ICD-10-CM

## 2015-10-24 DIAGNOSIS — Z30432 Encounter for removal of intrauterine contraceptive device: Secondary | ICD-10-CM

## 2015-10-24 DIAGNOSIS — T8369XA Infection and inflammatory reaction due to other prosthetic device, implant and graft in genital tract, initial encounter: Secondary | ICD-10-CM | POA: Diagnosis present

## 2015-10-24 DIAGNOSIS — R109 Unspecified abdominal pain: Secondary | ICD-10-CM

## 2015-10-24 DIAGNOSIS — Z87891 Personal history of nicotine dependence: Secondary | ICD-10-CM

## 2015-10-24 DIAGNOSIS — N730 Acute parametritis and pelvic cellulitis: Secondary | ICD-10-CM | POA: Diagnosis present

## 2015-10-24 DIAGNOSIS — R102 Pelvic and perineal pain: Secondary | ICD-10-CM | POA: Diagnosis not present

## 2015-10-24 LAB — CBC
HCT: 39.1 % (ref 36.0–46.0)
HEMOGLOBIN: 13.6 g/dL (ref 12.0–15.0)
MCH: 32.6 pg (ref 26.0–34.0)
MCHC: 34.8 g/dL (ref 30.0–36.0)
MCV: 93.8 fL (ref 78.0–100.0)
PLATELETS: 247 10*3/uL (ref 150–400)
RBC: 4.17 MIL/uL (ref 3.87–5.11)
RDW: 13.3 % (ref 11.5–15.5)
WBC: 9.3 10*3/uL (ref 4.0–10.5)

## 2015-10-24 LAB — WET PREP, GENITAL
CLUE CELLS WET PREP: NONE SEEN
SPERM: NONE SEEN
TRICH WET PREP: NONE SEEN
YEAST WET PREP: NONE SEEN

## 2015-10-24 LAB — COMPREHENSIVE METABOLIC PANEL
ALK PHOS: 38 U/L (ref 38–126)
ALT: 13 U/L — AB (ref 14–54)
AST: 19 U/L (ref 15–41)
Albumin: 4.1 g/dL (ref 3.5–5.0)
Anion gap: 8 (ref 5–15)
BUN: 9 mg/dL (ref 6–20)
CHLORIDE: 106 mmol/L (ref 101–111)
CO2: 24 mmol/L (ref 22–32)
Calcium: 9.1 mg/dL (ref 8.9–10.3)
Creatinine, Ser: 0.94 mg/dL (ref 0.44–1.00)
GFR calc Af Amer: >60 mL/min (ref >60)
Glucose, Bld: 97 mg/dL (ref 65–99)
Potassium: 3.9 mmol/L (ref 3.5–5.1)
Sodium: 138 mmol/L (ref 135–145)
Total Bilirubin: 0.8 mg/dL (ref 0.3–1.2)
Total Protein: 7.3 g/dL (ref 6.5–8.1)

## 2015-10-24 LAB — URINALYSIS, ROUTINE W REFLEX MICROSCOPIC
Bilirubin Urine: NEGATIVE
Glucose, UA: NEGATIVE mg/dL
Ketones, ur: NEGATIVE mg/dL
Leukocytes, UA: NEGATIVE
Nitrite: NEGATIVE
PROTEIN: NEGATIVE mg/dL
Specific Gravity, Urine: 1.015 (ref 1.005–1.030)
pH: 7 (ref 5.0–8.0)

## 2015-10-24 LAB — URINE MICROSCOPIC-ADD ON: Bacteria, UA: NONE SEEN

## 2015-10-24 LAB — POCT PREGNANCY, URINE: Preg Test, Ur: NEGATIVE

## 2015-10-24 MED ORDER — HYDROMORPHONE HCL 2 MG/ML IJ SOLN
2.0000 mg | Freq: Once | INTRAMUSCULAR | Status: AC
  Administered 2015-10-24: 2 mg via INTRAMUSCULAR
  Filled 2015-10-24: qty 1

## 2015-10-24 MED ORDER — DOXYCYCLINE HYCLATE 100 MG IV SOLR
100.0000 mg | Freq: Two times a day (BID) | INTRAVENOUS | Status: DC
  Administered 2015-10-25 – 2015-10-27 (×5): 100 mg via INTRAVENOUS
  Filled 2015-10-24 (×7): qty 100

## 2015-10-24 MED ORDER — SODIUM CHLORIDE 0.9 % IV SOLN
INTRAVENOUS | Status: DC
  Administered 2015-10-24 – 2015-10-28 (×7): via INTRAVENOUS

## 2015-10-24 MED ORDER — TRAMADOL HCL 50 MG PO TABS: 50.0000 mg | ORAL_TABLET | Freq: Four times a day (QID) | ORAL | Status: DC | PRN

## 2015-10-24 MED ORDER — ONDANSETRON HCL 4 MG/2ML IJ SOLN: 4.0000 mg | Freq: Four times a day (QID) | INTRAMUSCULAR | Status: DC | PRN

## 2015-10-24 MED ORDER — AZITHROMYCIN 250 MG PO TABS
1000.0000 mg | ORAL_TABLET | Freq: Once | ORAL | Status: AC
  Administered 2015-10-24: 1000 mg via ORAL
  Filled 2015-10-24: qty 4

## 2015-10-24 MED ORDER — DOXYCYCLINE HYCLATE 100 MG PO CAPS: 100.0000 mg | ORAL_CAPSULE | Freq: Two times a day (BID) | ORAL | Status: DC

## 2015-10-24 MED ORDER — OXYCODONE-ACETAMINOPHEN 5-325 MG PO TABS
1.0000 | ORAL_TABLET | ORAL | Status: DC | PRN
  Administered 2015-10-25 – 2015-10-28 (×9): 2 via ORAL
  Filled 2015-10-24 (×9): qty 2

## 2015-10-24 MED ORDER — ONDANSETRON 8 MG PO TBDP
8.0000 mg | ORAL_TABLET | Freq: Once | ORAL | Status: AC
  Administered 2015-10-24: 8 mg via ORAL
  Filled 2015-10-24: qty 1

## 2015-10-24 MED ORDER — DOXYCYCLINE HYCLATE 100 MG IV SOLR
100.0000 mg | Freq: Once | INTRAVENOUS | Status: AC
  Administered 2015-10-24: 100 mg via INTRAVENOUS
  Filled 2015-10-24: qty 100

## 2015-10-24 MED ORDER — CEFTRIAXONE SODIUM 250 MG IJ SOLR
250.0000 mg | Freq: Once | INTRAMUSCULAR | Status: AC
  Administered 2015-10-24: 250 mg via INTRAMUSCULAR
  Filled 2015-10-24: qty 250

## 2015-10-24 MED ORDER — DEXTROSE 5 % IV SOLN
2.0000 g | Freq: Two times a day (BID) | INTRAVENOUS | Status: DC
  Administered 2015-10-25 – 2015-10-27 (×6): 2 g via INTRAVENOUS
  Filled 2015-10-24 (×7): qty 2

## 2015-10-24 MED ORDER — IBUPROFEN 600 MG PO TABS
600.0000 mg | ORAL_TABLET | Freq: Four times a day (QID) | ORAL | Status: DC | PRN
Start: 2015-10-24 — End: 2015-10-28

## 2015-10-24 MED ORDER — CEFOTETAN DISODIUM 2 G IJ SOLR
2.0000 g | Freq: Once | INTRAMUSCULAR | Status: AC
  Administered 2015-10-24: 2 g via INTRAVENOUS
  Filled 2015-10-24: qty 2

## 2015-10-24 MED ORDER — ONDANSETRON HCL 4 MG PO TABS
4.0000 mg | ORAL_TABLET | Freq: Four times a day (QID) | ORAL | Status: DC | PRN
Start: 2015-10-24 — End: 2015-10-28

## 2015-10-24 MED ORDER — LACTATED RINGERS IV BOLUS (SEPSIS)
1000.0000 mL | Freq: Once | INTRAVENOUS | Status: AC
  Administered 2015-10-24: 1000 mL via INTRAVENOUS

## 2015-10-24 MED ORDER — HYDROMORPHONE HCL 1 MG/ML IJ SOLN
0.2000 mg | INTRAMUSCULAR | Status: DC | PRN
  Administered 2015-10-24 – 2015-10-25 (×3): 0.6 mg via INTRAVENOUS
  Administered 2015-10-25: 0.5 mg via INTRAVENOUS
  Administered 2015-10-25: 0.6 mg via INTRAVENOUS
  Filled 2015-10-24 (×5): qty 1

## 2015-10-24 MED ORDER — KETOROLAC TROMETHAMINE 60 MG/2ML IM SOLN
60.0000 mg | Freq: Once | INTRAMUSCULAR | Status: AC
  Administered 2015-10-24: 60 mg via INTRAMUSCULAR
  Filled 2015-10-24: qty 2

## 2015-10-24 MED ORDER — ACETAMINOPHEN 500 MG PO TABS
1000.0000 mg | ORAL_TABLET | Freq: Once | ORAL | Status: AC
  Administered 2015-10-24: 1000 mg via ORAL
  Filled 2015-10-24: qty 2

## 2015-10-24 NOTE — H&P (Signed)
Historyand Physical      CSN: [id number]  Arrival date and time: 10/24/15 1434  None    Chief Complaint  Patient presents with  . Abdominal Pain   Abdominal Pain This is a new problem. The current episode started today. The onset quality is sudden. The problem occurs constantly. The problem has been gradually worsening. The pain is located in the suprapubic region. The pain is severe. Associated symptoms include constipation, nausea and vomiting. Pertinent negatives include no diarrhea, dysuria or fever. She has tried acetaminophen for the symptoms. The treatment provided no relief.  Mirena IUD inserted on 10/02/2015 in clinic at 5 weeks post partum. Pt had heavy bleeding on 10/10/2015 and was started on OCs. Pt is having spotting now.  Pt had IC 4 days ago without pain.         RN note: Editor: Rolene Course, RN (Registered Nurse)     Expand All Collapse All  Severe lower abd pain started early this morning. IUD placed early in November, also on sprintec to control bleeding. Had been spotting, is a little more today. Started vomiting this a.m.       Past Medical History  Diagnosis Date  . Allergy   . Asthma     albuterol last used a few months ago  . Preterm labor   . Abscess of breast     Past Surgical History  Procedure Laterality Date  . Wisdom tooth extraction    . Cervical cerclage N/A 04/05/2015    Procedure: CERCLAGE CERVICAL; Surgeon: Lavonia Drafts, MD; Location: [facility]; Service: Gynecology; Laterality: N/A;    Family History  Problem Relation Age of Onset  . Diabetes Mother   . Stroke Mother   . Hypertension Father     Social History  Substance Use Topics  . Smoking status: Former Smoker -- 0.50 packs/day for 10 years    Types: Cigarettes  . Smokeless tobacco: Never Used  . Alcohol Use: No    Allergies:  Allergies  Allergen  Reactions  . Shellfish Allergy Hives  . Sulfa Antibiotics Other (See Comments)    Reaction: Unknown; childhood reaction  . Icy Hot Rash    Prescriptions prior to admission  Medication Sig Dispense Refill Last Dose  . acetaminophen (TYLENOL) 500 MG tablet Take 1,000 mg by mouth every 6 (six) hours as needed for headache.   10/24/2015 at Unknown time  . norgestimate-ethinyl estradiol (ORTHO-CYCLEN,SPRINTEC,PREVIFEM) 0.25-35 MG-MCG tablet Take 1 tablet by mouth daily. 1 Package 2 10/24/2015 at Unknown time  . albuterol (PROVENTIL HFA;VENTOLIN HFA) 108 (90 BASE) MCG/ACT inhaler Inhale 2 puffs into the lungs every 6 (six) hours as needed for wheezing or shortness of breath. Use spacer 1 Inhaler 2 emergency  . clindamycin (CLEOCIN) 300 MG capsule Take 1 capsule (300 mg total) by mouth every 8 (eight) hours. (Patient not taking: Reported on 09/06/2015) 30 capsule 0 Completed Course at Unknown time  . fluconazole (DIFLUCAN) 150 MG tablet Take 1 tablet (150 mg total) by mouth once. Can take additional dose three days later if symptoms persist (Patient not taking: Reported on 10/02/2015) 1 tablet 3 Completed Course at Unknown time  . ibuprofen (ADVIL,MOTRIN) 600 MG tablet Take 1 tablet (600 mg total) by mouth every 6 (six) hours. (Patient not taking: Reported on 10/24/2015) 30 tablet 0 Taking  . nystatin cream (MYCOSTATIN) Apply 1 application topically 2 (two) times daily. (Patient not taking: Reported on 10/02/2015) 30 g 3 Not Taking  . oxyCODONE-acetaminophen (PERCOCET/ROXICET) 5-325 MG  tablet Take 1 tablet by mouth every 4 (four) hours as needed (for pain scale 4-7). (Patient not taking: Reported on 10/24/2015) 20 tablet 0 Taking  . traMADol (ULTRAM) 50 MG tablet Take 1-2 tablets (50-100 mg total) by mouth every 6 (six) hours as needed for severe pain. (Patient not taking: Reported on 10/02/2015) 30 tablet 0 Not Taking    Review of  Systems  Constitutional: Positive for chills. Negative for fever.  Gastrointestinal: Positive for nausea, vomiting, abdominal pain and constipation. Negative for diarrhea.  Genitourinary: Negative for dysuria.  Musculoskeletal: Negative for back pain.   Physical Exam   Blood pressure 138/77, pulse 88, temperature 98.5 F (36.9 C), temperature source Oral, resp. rate 18, not currently breastfeeding.  Physical Exam  Nursing note and vitals reviewed. Constitutional: She is oriented to person, place, and time. She appears well-developed and well-nourished. No distress.  HENT:  Head: Normocephalic.  Eyes: Pupils are equal, round, and reactive to light.  Neck: Normal range of motion. Neck supple.  Cardiovascular: Normal rate.  Respiratory: Effort normal.  GI: Soft.  Genitourinary:  Small amount of blood in vagina; cervix parous; IUD strings visible; uterus and adnexa difficult to evaluate due to pt's discomfort  Musculoskeletal: Normal range of motion.  Neurological: She is alert and oriented to person, place, and time.  Skin: Skin is warm and dry.  Psychiatric: She has a normal mood and affect.    MAU Course  Procedures Toradol 60mg  IM given- Dilaudid 2mg  IM given Zofran 8mg  ODT given for nausea Pain tolerable after Dilaudid- nausea subsided Discussed with Dr. Harolyn Rutherford- will treat for PID Rocephin 250mg  IM and Zithromax 1 gm PO  Lab Results Last 24 Hours    Results for orders placed or performed during the hospital encounter of 10/24/15 (from the past 24 hour(s))  Urinalysis, Routine w reflex microscopic (not at [facility]) Status: Abnormal   Collection Time: 10/24/15 2:37 PM  Result Value Ref Range   Color, Urine YELLOW YELLOW   APPearance CLEAR CLEAR   Specific Gravity, Urine 1.015 1.005 - 1.030   pH 7.0 5.0 - 8.0   Glucose, UA NEGATIVE NEGATIVE mg/dL   Hgb urine dipstick LARGE (A) NEGATIVE   Bilirubin Urine NEGATIVE NEGATIVE    Ketones, ur NEGATIVE NEGATIVE mg/dL   Protein, ur NEGATIVE NEGATIVE mg/dL   Nitrite NEGATIVE NEGATIVE   Leukocytes, UA NEGATIVE NEGATIVE  Urine microscopic-add on Status: Abnormal   Collection Time: 10/24/15 2:37 PM  Result Value Ref Range   Squamous Epithelial / LPF 0-5 (A) NONE SEEN   WBC, UA 0-5 0 - 5 WBC/hpf   RBC / HPF 0-5 0 - 5 RBC/hpf   Bacteria, UA NONE SEEN NONE SEEN  Pregnancy, urine POC Status: None   Collection Time: 10/24/15 3:40 PM  Result Value Ref Range   Preg Test, Ur NEGATIVE NEGATIVE  Wet prep, genital Status: Abnormal   Collection Time: 10/24/15 6:02 PM  Result Value Ref Range   Yeast Wet Prep HPF POC NONE SEEN NONE SEEN   Trich, Wet Prep NONE SEEN NONE SEEN   Clue Cells Wet Prep HPF POC NONE SEEN NONE SEEN   WBC, Wet Prep HPF POC MODERATE (A) NONE SEEN   Sperm NONE SEEN     GC/chlamydia pending  Imaging Results (Last 48 hours)    US Transvaginal Non-ob  10/24/2015 CLINICAL DATA: Severe pain began this morning, bleeding since intrauterine device placed on 10/02/2015 EXAM: ULTRASOUND PELVIS TRANSVAGINAL TECHNIQUE: Transvaginal ultrasound examination of the pelvis was  performed including evaluation of the uterus, ovaries, adnexal regions, and pelvic cul-de-sac. COMPARISON: None. FINDINGS: Uterus Measurements: 10 x 5 cm. No fibroids or other mass visualized. Endometrium Thickness: 16 mm. The endometrium is prominent and it is difficult to consistently identified the intrauterine device. Right ovary Measurements: 44 x 25 x 29 mm. Normal appearance/no adnexal mass. Left ovary Measurements: 59 x 31 x 60 mm. 3 cm simple cyst. Other findings: There is a small volume of free fluid in the pelvis. Fluid appears somewhat complex. Solid appearing areas in both at adnexa, measuring about 5 x 3 x 2 cm bilaterally. These could represent fallopian tubes with debris within them but the appearance  is nonspecific. IMPRESSION: 1. Endometrium is 36mm and the IUD is difficult to identify 2. 3cm left ovarian cyst 3. Small volume of complex fluid in the pelvis, which could reflect the presence of hemorrhage or pelvic infection. Solid appearing oval areas in both adnexae separate from the ovaries, of uncertain origin. The possibility of debris-filled fallopian tubes is a consideration but the finding is not at all specific. Is there any evidence of pelvic inflammatory disease clinically? Correlate clinically. Follow up pelvic ultrasound in 1 or 2 weeks suggested. Electronically Signed By: Skipper Cliche M.D. On: 10/24/2015 17:30   went to discharge pt and temp requested- temp 102 ax Discharge order discontinued IV LR bolus per anesthesia due to difficult veins CBC, cmet and blood cultures x2 ordered Tylenol 1 gm for fever Care handed over to Kerry Hough, PA Assessment and Plan  PID- Rocephin and Zithormax given in MAU; Rx Doxycycline 100mg  BID for 14 days Tramadol Rx for pain F/u Monday in clinic as scheduled for appointment  LINEBERRY,SUSAN 10/24/2015, 3:15 PM   2100 - care assumed from Sherolyn Buba, NP RN reports significant increase in pain. Dilaudid ordered.  Re-check of temperature = 103 F Discussed patient with Dr. Harolyn Rutherford. Admit to Women's Unit for IV antibiotics and pain management.   A:PID  P: Admit to Women's Unit for IV antibiotics and pain management  Luvenia Redden, PA-C 10/24/2015 9:53 PM    Attestation of Attending Supervision of Advanced Practice Provider (PA/CNM/NP): Evaluation and management procedures were performed by the Advanced Practice Provider under my supervision and collaboration.  I have reviewed the Advanced Practice Provider's note and chart, and I agree with the management and plan.  Verita Schneiders, MD, [place] Attending [place], [facility]

## 2015-10-24 NOTE — MAU Note (Signed)
Severe lower abd pain started early this morning.   IUD placed early in November, also on sprintec to control bleeding.  Had been spotting, is a little more today.  Started vomiting this a.m.

## 2015-10-24 NOTE — MAU Provider Note (Signed)
History     CSN: [id number]  Arrival date and time: 10/24/15 1434   None     Chief Complaint  Patient presents with  . Abdominal Pain   Abdominal Pain This is a new problem. The current episode started today. The onset quality is sudden. The problem occurs constantly. The problem has been gradually worsening. The pain is located in the suprapubic region. The pain is severe. Associated symptoms include constipation, nausea and vomiting. Pertinent negatives include no diarrhea, dysuria or fever. She has tried acetaminophen for the symptoms. The treatment provided no relief.  Mirena IUD inserted on 10/02/2015 in clinic at 5 weeks post partum.  Pt had heavy bleeding on 10/10/2015 and was started on OCs.  Pt is having spotting now.   Pt had IC 4 days ago without pain.         RN note: Editor: Rolene Course, RN (Registered Nurse)     Expand All Collapse All   Severe lower abd pain started early this morning. IUD placed early in November, also on sprintec to control bleeding. Had been spotting, is a little more today. Started vomiting this a.m.       Past Medical History  Diagnosis Date  . Allergy   . Asthma     albuterol last used a few months ago  . Preterm labor   . Abscess of breast     Past Surgical History  Procedure Laterality Date  . Wisdom tooth extraction    . Cervical cerclage N/A 04/05/2015    Procedure: CERCLAGE CERVICAL;  Surgeon: Lavonia Drafts, MD;  Location: [facility];  Service: Gynecology;  Laterality: N/A;    Family History  Problem Relation Age of Onset  . Diabetes Mother   . Stroke Mother   . Hypertension Father     Social History  Substance Use Topics  . Smoking status: Former Smoker -- 0.50 packs/day for 10 years    Types: Cigarettes  . Smokeless tobacco: Never Used  . Alcohol Use: No    Allergies:  Allergies  Allergen Reactions  . Shellfish Allergy Hives  . Sulfa Antibiotics Other (See Comments)    Reaction:  Unknown;  childhood reaction  . Icy Hot Rash    Prescriptions prior to admission  Medication Sig Dispense Refill Last Dose  . acetaminophen (TYLENOL) 500 MG tablet Take 1,000 mg by mouth every 6 (six) hours as needed for headache.   10/24/2015 at Unknown time  . norgestimate-ethinyl estradiol (ORTHO-CYCLEN,SPRINTEC,PREVIFEM) 0.25-35 MG-MCG tablet Take 1 tablet by mouth daily. 1 Package 2 10/24/2015 at Unknown time  . albuterol (PROVENTIL HFA;VENTOLIN HFA) 108 (90 BASE) MCG/ACT inhaler Inhale 2 puffs into the lungs every 6 (six) hours as needed for wheezing or shortness of breath. Use spacer 1 Inhaler 2 emergency  . clindamycin (CLEOCIN) 300 MG capsule Take 1 capsule (300 mg total) by mouth every 8 (eight) hours. (Patient not taking: Reported on 09/06/2015) 30 capsule 0 Completed Course at Unknown time  . fluconazole (DIFLUCAN) 150 MG tablet Take 1 tablet (150 mg total) by mouth once. Can take additional dose three days later if symptoms persist (Patient not taking: Reported on 10/02/2015) 1 tablet 3 Completed Course at Unknown time  . ibuprofen (ADVIL,MOTRIN) 600 MG tablet Take 1 tablet (600 mg total) by mouth every 6 (six) hours. (Patient not taking: Reported on 10/24/2015) 30 tablet 0 Taking  . nystatin cream (MYCOSTATIN) Apply 1 application topically 2 (two) times daily. (Patient not taking: Reported on 10/02/2015) 30 g  3 Not Taking  . oxyCODONE-acetaminophen (PERCOCET/ROXICET) 5-325 MG tablet Take 1 tablet by mouth every 4 (four) hours as needed (for pain scale 4-7). (Patient not taking: Reported on 10/24/2015) 20 tablet 0 Taking  . traMADol (ULTRAM) 50 MG tablet Take 1-2 tablets (50-100 mg total) by mouth every 6 (six) hours as needed for severe pain. (Patient not taking: Reported on 10/02/2015) 30 tablet 0 Not Taking    Review of Systems  Constitutional: Positive for chills. Negative for fever.  Gastrointestinal: Positive for nausea, vomiting, abdominal pain and constipation. Negative for diarrhea.   Genitourinary: Negative for dysuria.  Musculoskeletal: Negative for back pain.   Physical Exam   Blood pressure 138/77, pulse 88, temperature 98.5 F (36.9 C), temperature source Oral, resp. rate 18, not currently breastfeeding.  Physical Exam  Nursing note and vitals reviewed. Constitutional: She is oriented to person, place, and time. She appears well-developed and well-nourished. No distress.  HENT:  Head: Normocephalic.  Eyes: Pupils are equal, round, and reactive to light.  Neck: Normal range of motion. Neck supple.  Cardiovascular: Normal rate.   Respiratory: Effort normal.  GI: Soft.  Genitourinary:  Small amount of blood in vagina; cervix parous; IUD strings visible; uterus and adnexa difficult to evaluate due to pt's discomfort  Musculoskeletal: Normal range of motion.  Neurological: She is alert and oriented to person, place, and time.  Skin: Skin is warm and dry.  Psychiatric: She has a normal mood and affect.    MAU Course  Procedures Toradol 60mg  IM given- Dilaudid 2mg  IM given Zofran 8mg  ODT given for nausea Pain tolerable after Dilaudid- nausea subsided Discussed with Dr. Harolyn Rutherford- will treat for PID Rocephin 250mg  IM and Zithromax 1 gm PO Results for orders placed or performed during the hospital encounter of 10/24/15 (from the past 24 hour(s))  Urinalysis, Routine w reflex microscopic (not at [facility])     Status: Abnormal   Collection Time: 10/24/15  2:37 PM  Result Value Ref Range   Color, Urine YELLOW YELLOW   APPearance CLEAR CLEAR   Specific Gravity, Urine 1.015 1.005 - 1.030   pH 7.0 5.0 - 8.0   Glucose, UA NEGATIVE NEGATIVE mg/dL   Hgb urine dipstick LARGE (A) NEGATIVE   Bilirubin Urine NEGATIVE NEGATIVE   Ketones, ur NEGATIVE NEGATIVE mg/dL   Protein, ur NEGATIVE NEGATIVE mg/dL   Nitrite NEGATIVE NEGATIVE   Leukocytes, UA NEGATIVE NEGATIVE  Urine microscopic-add on     Status: Abnormal   Collection Time: 10/24/15  2:37 PM  Result Value Ref Range    Squamous Epithelial / LPF 0-5 (A) NONE SEEN   WBC, UA 0-5 0 - 5 WBC/hpf   RBC / HPF 0-5 0 - 5 RBC/hpf   Bacteria, UA NONE SEEN NONE SEEN  Pregnancy, urine POC     Status: None   Collection Time: 10/24/15  3:40 PM  Result Value Ref Range   Preg Test, Ur NEGATIVE NEGATIVE  Wet prep, genital     Status: Abnormal   Collection Time: 10/24/15  6:02 PM  Result Value Ref Range   Yeast Wet Prep HPF POC NONE SEEN NONE SEEN   Trich, Wet Prep NONE SEEN NONE SEEN   Clue Cells Wet Prep HPF POC NONE SEEN NONE SEEN   WBC, Wet Prep HPF POC MODERATE (A) NONE SEEN   Sperm NONE SEEN   GC/chlamydia pending US Transvaginal Non-ob  10/24/2015  CLINICAL DATA:  Severe pain began this morning, bleeding since intrauterine device placed on 10/02/2015  EXAM: ULTRASOUND PELVIS TRANSVAGINAL TECHNIQUE: Transvaginal ultrasound examination of the pelvis was performed including evaluation of the uterus, ovaries, adnexal regions, and pelvic cul-de-sac. COMPARISON:  None. FINDINGS: Uterus Measurements: 10 x 5 cm. No fibroids or other mass visualized. Endometrium Thickness: 16 mm. The endometrium is prominent and it is difficult to consistently identified the intrauterine device. Right ovary Measurements: 44 x 25 x 29 mm. Normal appearance/no adnexal mass. Left ovary Measurements: 59 x 31 x 60 mm. 3 cm simple cyst. Other findings: There is a small volume of free fluid in the pelvis. Fluid appears somewhat complex. Solid appearing areas in both at adnexa, measuring about 5 x 3 x 2 cm bilaterally. These could represent fallopian tubes with debris within them but the appearance is nonspecific. IMPRESSION: 1.  Endometrium is 38mm and the IUD is difficult to identify 2.  3cm left ovarian cyst 3. Small volume of complex fluid in the pelvis, which could reflect the presence of hemorrhage or pelvic infection. Solid appearing oval areas in both adnexae separate from the ovaries, of uncertain origin. The possibility of debris-filled fallopian  tubes is a consideration but the finding is not at all specific. Is there any evidence of pelvic inflammatory disease clinically? Correlate clinically. Follow up pelvic ultrasound in 1 or 2 weeks suggested. Electronically Signed   By: Skipper Cliche M.D.   On: 10/24/2015 17:30  went to discharge pt and temp requested- temp 102 ax Discharge order discontinued IV LR bolus per anesthesia due to difficult veins CBC, cmet and blood cultures x2 ordered Tylenol 1 gm for fever Care handed over to Kerry Hough, PA Assessment and Plan  PID- Rocephin and Zithormax given in MAU; Rx Doxycycline 100mg  BID for 14 days Tramadol Rx for pain F/u Monday in clinic as scheduled for appointment  LINEBERRY,SUSAN 10/24/2015, 3:15 PM   2100 - care assumed from Sherolyn Buba, NP RN reports significant increase in pain. Dilaudid ordered.  Re-check of temperature = 103 F Discussed patient with Dr. Harolyn Rutherford. Admit to Women's Unit for IV antibiotics and pain management.   A: PID  P: Admit to Women's Unit for IV antibiotics and pain management  Luvenia Redden, PA-C 10/24/2015 9:53 PM

## 2015-10-25 ENCOUNTER — Encounter (HOSPITAL_COMMUNITY): Payer: Self-pay | Admitting: Registered Nurse

## 2015-10-25 DIAGNOSIS — N7093 Salpingitis and oophoritis, unspecified: Secondary | ICD-10-CM

## 2015-10-25 DIAGNOSIS — T8369XA Infection and inflammatory reaction due to other prosthetic device, implant and graft in genital tract, initial encounter: Secondary | ICD-10-CM | POA: Diagnosis present

## 2015-10-25 DIAGNOSIS — Z87891 Personal history of nicotine dependence: Secondary | ICD-10-CM

## 2015-10-25 DIAGNOSIS — J45909 Unspecified asthma, uncomplicated: Secondary | ICD-10-CM

## 2015-10-25 DIAGNOSIS — N730 Acute parametritis and pelvic cellulitis: Secondary | ICD-10-CM

## 2015-10-25 LAB — GC/CHLAMYDIA PROBE AMP (~~LOC~~) NOT AT ARMC
Chlamydia: NEGATIVE
NEISSERIA GONORRHEA: NEGATIVE

## 2015-10-25 MED ORDER — ACETAMINOPHEN 325 MG PO TABS
650.0000 mg | ORAL_TABLET | Freq: Once | ORAL | Status: AC
  Administered 2015-10-25: 650 mg via ORAL
  Filled 2015-10-25: qty 2

## 2015-10-25 MED ORDER — KETOROLAC TROMETHAMINE 30 MG/ML IJ SOLN
30.0000 mg | Freq: Four times a day (QID) | INTRAMUSCULAR | Status: DC
Start: 2015-10-25 — End: 2015-10-28
  Administered 2015-10-25 – 2015-10-28 (×12): 30 mg via INTRAVENOUS
  Filled 2015-10-25 (×12): qty 1

## 2015-10-25 MED ORDER — CYCLOBENZAPRINE HCL 10 MG PO TABS
10.0000 mg | ORAL_TABLET | Freq: Three times a day (TID) | ORAL | Status: DC | PRN
  Administered 2015-10-25: 10 mg via ORAL
  Filled 2015-10-25 (×2): qty 1

## 2015-10-25 NOTE — Progress Notes (Signed)
[facility] Attending Note  Subjective:  Patient reports continued pelvic pain and diffuse myalgia. Pain is a little improved since starting IV antibiotics.  Taking mainly narcotic medications for pain.  Able to tolerate some oral intake.   Admitted on 10/24/2015 for Infection associated with intrauterine device (IUD) (HCC)/PID after Mirena placement on 10/02/2015.   Objective:  Blood pressure 96/60, pulse 108, temperature 99.9 F (37.7 C), temperature source Oral, resp. rate 20, height 5\' 2"  (1.575 m), weight 210 lb (95.255 kg), SpO2 97 %, not currently breastfeeding. Filed Vitals:   10/24/15 2230 10/24/15 2253 10/25/15 0005 10/25/15 0543  BP: 96/43 99/53  96/60  Pulse: 120 84  108  Temp: 100 F (37.8 C)  100.3 F (37.9 C) 99.9 F (37.7 C)  TempSrc: Oral   Oral  Resp: 20 18  20   Height:  5\' 2"  (1.575 m)    Weight:  210 lb (95.255 kg)    SpO2: 99% 93%  97%  Last fever was 103 around 2146 on 10/24/15  Gen: NAD HENT: Normocephalic, atraumatic Lungs: Normal respiratory effort Heart: Regular rate noted Abdomen: moderate-severe tenderness in lower abdomen, no rebound, no guarding Cervix: Deferred Ext: 2+ DTRs, no edema, no cyanosis, negative Homan's sign  US Transvaginal Non-ob  10/24/2015  CLINICAL DATA:  Severe pain began this morning, bleeding since intrauterine device placed on 10/02/2015 EXAM: ULTRASOUND PELVIS TRANSVAGINAL TECHNIQUE: Transvaginal ultrasound examination of the pelvis was performed including evaluation of the uterus, ovaries, adnexal regions, and pelvic cul-de-sac. COMPARISON:  None. FINDINGS: Uterus Measurements: 10 x 5 cm. No fibroids or other mass visualized. Endometrium Thickness: 16 mm. The endometrium is prominent and it is difficult to consistently identified the intrauterine device. Right ovary Measurements: 44 x 25 x 29 mm. Normal appearance/no adnexal mass. Left ovary Measurements: 59 x 31 x 60 mm. 3 cm simple cyst. Other findings: There is a  small volume of free fluid in the pelvis. Fluid appears somewhat complex. Solid appearing areas in both at adnexa, measuring about 5 x 3 x 2 cm bilaterally. These could represent fallopian tubes with debris within them but the appearance is nonspecific. IMPRESSION: 1.  Endometrium is 57mm and the IUD is difficult to identify 2.  3cm left ovarian cyst 3. Small volume of complex fluid in the pelvis, which could reflect the presence of hemorrhage or pelvic infection. Solid appearing oval areas in both adnexae separate from the ovaries, of uncertain origin. The possibility of debris-filled fallopian tubes is a consideration but the finding is not at all specific. Is there any evidence of pelvic inflammatory disease clinically? Correlate clinically. Follow up pelvic ultrasound in 1 or 2 weeks suggested. Electronically Signed   By: Skipper Cliche M.D.   On: 10/24/2015 17:30    Assessment & Plan:  24 y.o. G2P1101 at with PID/TOA likely associated with recent IUD insertion.  On personal review of images, IUD is visualized in the uterus, will review images with radiologist today.  Strings were visible on pelvic exam done on admission. - Continue IV Cefotetan and Doxycycline for now until at least 48 hours afebrile.  The recommendation is to treat with the IUD in place; consider removal only if symptoms do not improve or for other indication - Advised NSAIDs for pain and narcotics for severe pain; Toradol 30 mg IV q6h x 5 doses ordered.  Flexeril ordered - Blood and urine cultures pending - Follow up ultrasound radiologist for location of IUD; may consider Xray of pelvis if needed. -  Continue close observation.  Verita Schneiders, MD, [place] Attending [place], [facility]

## 2015-10-25 NOTE — Progress Notes (Signed)
CRNA attempted IV restart x3 without success.  IV team notified of need for IV restart and will come for restart.

## 2015-10-26 DIAGNOSIS — R102 Pelvic and perineal pain: Secondary | ICD-10-CM

## 2015-10-26 DIAGNOSIS — Z30432 Encounter for removal of intrauterine contraceptive device: Secondary | ICD-10-CM

## 2015-10-26 LAB — URINE CULTURE

## 2015-10-26 MED ORDER — NORGESTIMATE-ETH ESTRADIOL 0.25-35 MG-MCG PO TABS
1.0000 | ORAL_TABLET | Freq: Every day | ORAL | Status: DC
Start: 2015-10-26 — End: 2015-10-28
  Administered 2015-10-27: 1 via ORAL

## 2015-10-26 MED ORDER — ACETAMINOPHEN 325 MG PO TABS
650.0000 mg | ORAL_TABLET | Freq: Once | ORAL | Status: AC
  Administered 2015-10-26: 650 mg via ORAL
  Filled 2015-10-26: qty 2

## 2015-10-26 NOTE — Progress Notes (Signed)
Pt informed we do not carry her Birth control medication.  Requested that she bring in her own, she stated she does not have any more, but if the doctor called it in, her boyfriend would go and pick it up.  I told her to tell the doctor tomorrow, when he/she made round.  Pt reports feels better today, but is anxious to get home to her baby.  Edmonia Caprio RN

## 2015-10-26 NOTE — Progress Notes (Signed)
Bilateral pyosalpinx with IUD in place  Subjective: Patient reports increased pain from admission in upper abdomen and back.    Objective: Filed Vitals:   10/25/15 1931 10/25/15 2242 10/26/15 0553 10/26/15 0645  BP:  113/60 142/74   Pulse:  114 119   Temp: 100.4 F (38 C) 99.9 F (37.7 C) 102 F (38.9 C) 101 F (38.3 C)  TempSrc: Oral  Oral Oral  Resp:  20 20   Height:      Weight:      SpO2:  100% 100%     I have reviewed patient's vital signs, intake and output, medications, labs and microbiology.  General: alert, cooperative and mild distress GI: soft guarding moderate to severe tenderness + rebound as expected  Blood cultures pending Gc Chlamydia is negative  Assessment/Plan: Bilateral pyosalpinx with IUD in place:  Clinical status is unimproved from admission  On cefotan and doxycycline  If does not turn around in 24 hours or so will remove IUD and change antibiotics, probably to zosyn +/- flagyl as gen probe negative     LOS: 2 days    Kim Johns H 10/26/2015, 9:03 AM

## 2015-10-26 NOTE — Progress Notes (Signed)
Attempted to visit pt upon referral from RN, Almyra Free. Pt was sleeping.  Left pt note at bedside. Will attempt to connect later today or tomorrow.     Please page as further needs arise.  Donald Prose. Elyn Peers, M.Div. [facility] Chaplain Pager [phone] Office [phone]   10/26/15 1249  Clinical Encounter Type  Visited With Other (Comment)  Visit Type Initial  Referral From Nurse

## 2015-10-26 NOTE — Progress Notes (Signed)
Patient ID: Kim Johns, female   DOB: 15-Oct-1991, 24 y.o.   MRN: [id number] Patient lost IV site today. Still febrile with temp of 102 this am.  She has had heavy bleeding since IUD inserted and would like it removed.  She will continue OC's for contraception. Procedure: Procedure: Speculum placed inside vagina.  Cervix visualized.  Strings grasped with ring forceps.  IUD removed intact.

## 2015-10-26 NOTE — Progress Notes (Signed)
Pt's PIV infiltrated and was removed.  Phoned Dr. Gala Romney at 11:45 with update and she asked RN to speak to pharmacist regarding possible IM antibiotics.    Spoke to [employer], [place], who provided recommendations from the [facility].  Phoned Dr. Gala Romney back at 12 noon but phone not answered.  Phoned MD again at 13:00 and was told that Dr. Kennon Rounds was now covering and that she was in a surgery.  Dr. Kennon Rounds to phone back after case.  Updated Dr. Kennon Rounds of above information at 13:15 and she said she would come talk to patient about need for midline catheter in order to continue IV antibiotics and for need to remove IUD.

## 2015-10-27 MED ORDER — NORGESTIMATE-ETH ESTRADIOL 0.25-35 MG-MCG PO TABS: 1.0000 | ORAL_TABLET | Freq: Every day | ORAL | Status: DC

## 2015-10-27 NOTE — Progress Notes (Signed)
Subjective: still has pain but no fever Patient reports tolerating PO.  Low abdominal pain  Objective: I have reviewed patient's vital signs, medications, labs and radiology results. Blood pressure 124/66, pulse 96, temperature 98.9 F (37.2 C), temperature source Oral, resp. rate 18, height 5\' 2"  (1.575 m), weight 95.255 kg (210 lb), SpO2 100 %, not currently breastfeeding. .tem General: alert, cooperative and no distress GI: soft, non-tender; bowel sounds normal; no masses,  no organomegaly CBC    Component Value Date/Time   WBC 9.3 10/24/2015 1915   RBC 4.17 10/24/2015 1915   HGB 13.6 10/24/2015 1915   HCT 39.1 10/24/2015 1915   PLT 247 10/24/2015 1915   MCV 93.8 10/24/2015 1915   MCH 32.6 10/24/2015 1915   MCHC 34.8 10/24/2015 1915   RDW 13.3 10/24/2015 1915   LYMPHSABS 2.1 08/19/2015 0430   MONOABS 0.9 08/19/2015 0430   EOSABS 0.1 08/19/2015 0430   BASOSABS 0.0 08/19/2015 0430      Assessment/Plan: PID, bilateral TOA, afebrile for more than 24 hr now Continue current antibiotic therapy   LOS: 3 days    Klara Stjames 10/27/2015, 10:18 AM

## 2015-10-28 ENCOUNTER — Encounter: Payer: Self-pay | Admitting: Obstetrics & Gynecology

## 2015-10-28 MED ORDER — DOXYCYCLINE HYCLATE 100 MG PO CAPS: 100.0000 mg | ORAL_CAPSULE | Freq: Two times a day (BID) | ORAL | Status: DC

## 2015-10-28 MED ORDER — CYCLOBENZAPRINE HCL 10 MG PO TABS: 10.0000 mg | ORAL_TABLET | Freq: Three times a day (TID) | ORAL | Status: DC | PRN

## 2015-10-28 MED ORDER — METRONIDAZOLE 500 MG PO TABS: 500.0000 mg | ORAL_TABLET | Freq: Two times a day (BID) | ORAL | Status: AC

## 2015-10-28 MED ORDER — IBUPROFEN 600 MG PO TABS: 600.0000 mg | ORAL_TABLET | Freq: Four times a day (QID) | ORAL | Status: DC | PRN

## 2015-10-28 MED ORDER — NORGESTIMATE-ETH ESTRADIOL 0.25-35 MG-MCG PO TABS: 1.0000 | ORAL_TABLET | Freq: Every day | ORAL | Status: DC

## 2015-10-28 MED ORDER — OXYCODONE-ACETAMINOPHEN 5-325 MG PO TABS: 1.0000 | ORAL_TABLET | ORAL | Status: DC | PRN

## 2015-10-28 NOTE — Care Management Note (Signed)
Case Management Note  Patient Details  Name: Kim Johns MRN: [id number] Date of Birth: 08/10/1991  Subjective/Objective:     Mirena IUD removed               Action/Plan: Provided pt with  [name] letter to receive her medications. Pt has [employer] Medicaid. NCM spoke to pt and made her aware that [place] can be used once per year. She can pick up meds from pharmacy for $3.00 copay except Narcotics. Sent her a goodrx coupon for Oxycodone.  Pt states she works and could afford $3.00 copay.   Expected Discharge Date:  10/28/2015              Expected Discharge Plan:  Home/Self Care  In-House Referral:     Discharge planning Services  CM Consult   Status of Service:  Completed, signed off  Medicare Important Message Given:    Date Medicare IM Given:    Medicare IM give by:    Date Additional Medicare IM Given:    Additional Medicare Important Message give by:     If discussed at [place] of Stay Meetings, dates discussed:    Additional Comments:  Erenest Rasher, RN 10/28/2015, 10:02 AM

## 2015-10-28 NOTE — Progress Notes (Signed)
Discharge instructions reviewed with patient.  Patient states understanding of home care, medications, activity, signs/symptoms to report to MD and return MD office visit.  Patients significant other and family will assist with her care @ home.  No home  equipment needed, patient has prescriptions and all personal belongings.  Patient ambulated for discharge in stable condition with staff without incident.  Care Manager gave patient [place] discounted drugs for her prescriptions.

## 2015-10-28 NOTE — Discharge Summary (Signed)
Physician Discharge Summary  Patient ID: Kim Johns MRN: [id number] DOB/AGE: [birth year] 25 y.o.  Admit date: 10/24/2015 Discharge date: 10/28/2015  Principal Problem:   Infection associated with intrauterine device (IUD) [facility]) Active Problems:   PID (acute pelvic inflammatory disease)   TOA (tubo-ovarian abscess)  Procedures:  IUD removal on 10/26/15  Discharged Condition: Stable  Hospital Course: Patient was admitted on 10/24/15 with fevers and abdominal pain, after recent IUD placement.  She was diagnosed with PID and started on Cefotetan and Doxycycline. She continued to have fevers and IUD was removed after 48 hours of therapy.  Her last fever was on 10/26/15.  Her pain began to improve and on day of discharge, she was afebrile for 2 days and pain was managed on oral analgesic agents.  She was switched to oral Metronidazole and Doxycycline, and deemed stable for discharge to home with outpatient followup.  Significant Diagnostic Studies:  Results for orders placed or performed during the hospital encounter of 10/24/15 (from the past 168 hour(s))  GC/Chlamydia probe amp ([place])not at [facility]   Collection Time: 10/24/15 12:00 AM  Result Value Ref Range   Chlamydia Negative    Neisseria gonorrhea Negative   Urine culture   Collection Time: 10/24/15  2:37 PM  Result Value Ref Range   Specimen Description URINE, CLEAN CATCH    Special Requests NONE    Culture      9,000 COLONIES/mL INSIGNIFICANT GROWTH Performed at [facility]    Report Status 10/26/2015 FINAL   Urinalysis, Routine w reflex microscopic (not at [facility])   Collection Time: 10/24/15  2:37 PM  Result Value Ref Range   Color, Urine YELLOW YELLOW   APPearance CLEAR CLEAR   Specific Gravity, Urine 1.015 1.005 - 1.030   pH 7.0 5.0 - 8.0   Glucose, UA NEGATIVE NEGATIVE mg/dL   Hgb urine dipstick LARGE (A) NEGATIVE   Bilirubin Urine NEGATIVE NEGATIVE   Ketones, ur NEGATIVE NEGATIVE mg/dL   Protein, ur  NEGATIVE NEGATIVE mg/dL   Nitrite NEGATIVE NEGATIVE   Leukocytes, UA NEGATIVE NEGATIVE  Urine microscopic-add on   Collection Time: 10/24/15  2:37 PM  Result Value Ref Range   Squamous Epithelial / LPF 0-5 (A) NONE SEEN   WBC, UA 0-5 0 - 5 WBC/hpf   RBC / HPF 0-5 0 - 5 RBC/hpf   Bacteria, UA NONE SEEN NONE SEEN  Pregnancy, urine POC   Collection Time: 10/24/15  3:40 PM  Result Value Ref Range   Preg Test, Ur NEGATIVE NEGATIVE  Wet prep, genital   Collection Time: 10/24/15  6:02 PM  Result Value Ref Range   Yeast Wet Prep HPF POC NONE SEEN NONE SEEN   Trich, Wet Prep NONE SEEN NONE SEEN   Clue Cells Wet Prep HPF POC NONE SEEN NONE SEEN   WBC, Wet Prep HPF POC MODERATE (A) NONE SEEN   Sperm NONE SEEN   Culture, blood (routine x 2)   Collection Time: 10/24/15  7:15 PM  Result Value Ref Range   Specimen Description BLOOD LEFT ARM    Special Requests BOTTLES DRAWN AEROBIC AND ANAEROBIC 5CC    Culture      NO GROWTH 3 DAYS Performed at [facility]    Report Status PENDING   CBC   Collection Time: 10/24/15  7:15 PM  Result Value Ref Range   WBC 9.3 4.0 - 10.5 K/uL   RBC 4.17 3.87 - 5.11 MIL/uL   Hemoglobin 13.6 12.0 - 15.0  g/dL   HCT 39.1 36.0 - 46.0 %   MCV 93.8 78.0 - 100.0 fL   MCH 32.6 26.0 - 34.0 pg   MCHC 34.8 30.0 - 36.0 g/dL   RDW 13.3 11.5 - 15.5 %   Platelets 247 150 - 400 K/uL  Comprehensive metabolic panel   Collection Time: 10/24/15  7:15 PM  Result Value Ref Range   Sodium 138 135 - 145 mmol/L   Potassium 3.9 3.5 - 5.1 mmol/L   Chloride 106 101 - 111 mmol/L   CO2 24 22 - 32 mmol/L   Glucose, Bld 97 65 - 99 mg/dL   BUN 9 6 - 20 mg/dL   Creatinine, Ser 0.94 0.44 - 1.00 mg/dL   Calcium 9.1 8.9 - 10.3 mg/dL   Total Protein 7.3 6.5 - 8.1 g/dL   Albumin 4.1 3.5 - 5.0 g/dL   AST 19 15 - 41 U/L   ALT 13 (L) 14 - 54 U/L   Alkaline Phosphatase 38 38 - 126 U/L   Total Bilirubin 0.8 0.3 - 1.2 mg/dL   GFR calc non Af Amer >60 >60 mL/min   GFR calc Af  Amer >60 >60 mL/min   Anion gap 8 5 - 15  Culture, blood (routine x 2)   Collection Time: 10/24/15  7:26 PM  Result Value Ref Range   Specimen Description BLOOD RIGHT ARM    Special Requests BOTTLES DRAWN AEROBIC AND ANAEROBIC 10CC    Culture      NO GROWTH 3 DAYS Performed at [facility]    Report Status PENDING     US Transvaginal Non-ob  10/24/2015  CLINICAL DATA:  Severe pain began this morning, bleeding since intrauterine device placed on 10/02/2015 EXAM: ULTRASOUND PELVIS TRANSVAGINAL TECHNIQUE: Transvaginal ultrasound examination of the pelvis was performed including evaluation of the uterus, ovaries, adnexal regions, and pelvic cul-de-sac. COMPARISON:  None. FINDINGS: Uterus Measurements: 10 x 5 cm. No fibroids or other mass visualized. Endometrium Thickness: 16 mm. The endometrium is prominent and it is difficult to consistently identified the intrauterine device. Right ovary Measurements: 44 x 25 x 29 mm. Normal appearance/no adnexal mass. Left ovary Measurements: 59 x 31 x 60 mm. 3 cm simple cyst. Other findings: There is a small volume of free fluid in the pelvis. Fluid appears somewhat complex. Solid appearing areas in both at adnexa, measuring about 5 x 3 x 2 cm bilaterally. These could represent fallopian tubes with debris within them but the appearance is nonspecific. IMPRESSION: 1.  Endometrium is 25mm and the IUD is difficult to identify 2.  3cm left ovarian cyst 3. Small volume of complex fluid in the pelvis, which could reflect the presence of hemorrhage or pelvic infection. Solid appearing oval areas in both adnexae separate from the ovaries, of uncertain origin. The possibility of debris-filled fallopian tubes is a consideration but the finding is not at all specific. Is there any evidence of pelvic inflammatory disease clinically? Correlate clinically. Follow up pelvic ultrasound in 1 or 2 weeks suggested. Electronically Signed   By: Skipper Cliche M.D.   On:  10/24/2015 17:30   Treatments: Antibiotics:Cefotetan and Doxycycline and Analgesia: Ibuprofen and Percocet  Discharge Exam: Blood pressure 120/61, pulse 69, temperature 97.9 F (36.6 C), temperature source Oral, resp. rate 14, height 5\' 2"  (1.575 m), weight 210 lb (95.255 kg), SpO2 100 %, not currently breastfeeding. Gen: NAD HENT: Normocephalic, atraumatic Lungs: Normal respiratory effort Heart: Regular rate noted Abdomen: no tenderness in lower abdomen, no  rebound, no guarding Cervix: Deferred Ext: 2+ DTRs, no edema, no cyanosis, negative Homan's sign  Disposition: 01-Home or Self Care     Medication List    STOP taking these medications        acetaminophen 500 MG tablet  Commonly known as:  TYLENOL     clindamycin 300 MG capsule  Commonly known as:  CLEOCIN     fluconazole 150 MG tablet  Commonly known as:  DIFLUCAN     nystatin cream  Commonly known as:  MYCOSTATIN     traMADol 50 MG tablet  Commonly known as:  ULTRAM      TAKE these medications        albuterol 108 (90 BASE) MCG/ACT inhaler  Commonly known as:  PROVENTIL HFA;VENTOLIN HFA  Inhale 2 puffs into the lungs every 6 (six) hours as needed for wheezing or shortness of breath. Use spacer     cyclobenzaprine 10 MG tablet  Commonly known as:  FLEXERIL  Take 1 tablet (10 mg total) by mouth 3 (three) times daily as needed for muscle spasms.     doxycycline 100 MG capsule  Commonly known as:  VIBRAMYCIN  Take 1 capsule (100 mg total) by mouth 2 (two) times daily.     ibuprofen 600 MG tablet  Commonly known as:  ADVIL,MOTRIN  Take 1 tablet (600 mg total) by mouth every 6 (six) hours as needed for mild pain or moderate pain (mild pain).     metroNIDAZOLE 500 MG tablet  Commonly known as:  FLAGYL  Take 1 tablet (500 mg total) by mouth 2 (two) times daily.     norgestimate-ethinyl estradiol 0.25-35 MG-MCG tablet  Commonly known as:  ORTHO-CYCLEN,SPRINTEC,PREVIFEM  Take 1 tablet by mouth daily.      norgestimate-ethinyl estradiol 0.25-35 MG-MCG tablet  Commonly known as:  ORTHO-CYCLEN,SPRINTEC,PREVIFEM  Take 1 tablet by mouth daily. Only take active pills continuously     oxyCODONE-acetaminophen 5-325 MG tablet  Commonly known as:  PERCOCET/ROXICET  Take 1-2 tablets by mouth every 3 (three) hours as needed (moderate to severe pain (when tolerating fluids)).       Follow-up Information    Follow up with [facility].   Specialty:  Obstetrics and Gynecology   Why:  You will be called with appointment time and date. Call clinic/come to [facility] for any concerning issues   Contact information:   [address] [phone]      Signed: Osborne Oman 10/28/2015, 12:00 PM

## 2015-10-29 LAB — CULTURE, BLOOD (ROUTINE X 2)
CULTURE: NO GROWTH
Culture: NO GROWTH

## 2015-10-30 ENCOUNTER — Ambulatory Visit: Payer: Medicaid Other | Admitting: Obstetrics & Gynecology

## 2015-10-30 ENCOUNTER — Telehealth: Payer: Self-pay | Admitting: *Deleted

## 2015-10-30 NOTE — Telephone Encounter (Signed)
Patient called and stated that she is having pain under her ribs and in her shoulder that started the same time as her pelvic problem and is not getting better. I spoke with Dr. Elly Modena about the patient and she stated that the problem is not related to her PID. I advised patient of this and she became angry and started yelling that we are always trying to push something off on other doctors and we don't want to take responsibility for anything. Patient was already scheduled for a string check today so I asked if she would like to keep this appointment and she stated that she would come today.

## 2015-11-06 ENCOUNTER — Telehealth: Payer: Self-pay | Admitting: *Deleted

## 2015-11-06 ENCOUNTER — Ambulatory Visit: Payer: Medicaid Other

## 2015-11-06 DIAGNOSIS — N898 Other specified noninflammatory disorders of vagina: Secondary | ICD-10-CM

## 2015-11-06 DIAGNOSIS — B3731 Acute candidiasis of vulva and vagina: Secondary | ICD-10-CM

## 2015-11-06 DIAGNOSIS — B373 Candidiasis of vulva and vagina: Secondary | ICD-10-CM

## 2015-11-06 MED ORDER — FLUCONAZOLE 150 MG PO TABS: 150.0000 mg | ORAL_TABLET | Freq: Once | ORAL | Status: DC

## 2015-11-06 MED ORDER — FLUCONAZOLE 150 MG PO TABS
150.0000 mg | ORAL_TABLET | Freq: Once | ORAL | Status: DC
Start: 2015-11-06 — End: 2015-11-16

## 2015-11-06 NOTE — Telephone Encounter (Signed)
Pt states that she recently finished abx for pid. She now has a very bad yeast infection. She has a question about her birth control also. Would like a call back to discuss this as soon as possible.

## 2015-11-06 NOTE — Telephone Encounter (Signed)
Called Kim Johns back and she reports she finished her antibiotics and now has a bad yeast infection- vaginal itching and white discharge. Would like diflucan one tab with a refill - she states it usually takes 2 to clear it up. Also reports she is still bleeding. Diflucan with refill approved by Dr. Elly Modena. States she had an IUD and was already having bleeding and had started taking sprintec. States then she had the IUD taken out and the bleeding has never stopped. States one day she took her pill late- usually takes at 2pm , took it 1 am. I advised her that with all the changes her body has been thru, recently had baby, had IUD, had IUD removed, on sprintec- that it may take awhile for her body to adjust and irregular bleeding is normal, but should be stopping soon. I advised her to continue taking her pills - and if she is still bleeding by the time she is approaching the last few days of her 2nd pack to call us. We then discussed since she has an appointment next week- will just discuss how her bleeding is then.

## 2015-11-06 NOTE — Addendum Note (Signed)
Addended by: Samuel Germany on: 11/06/2015 12:11 PM   Modules accepted: Orders

## 2015-11-06 NOTE — Telephone Encounter (Signed)
Order sent to [facility] in error. Called [facility] and canceled order. Reentered and sent to patient's preferred pharmacy.

## 2015-11-08 ENCOUNTER — Ambulatory Visit: Payer: Medicaid Other | Attending: Family Medicine

## 2015-11-16 ENCOUNTER — Ambulatory Visit (INDEPENDENT_AMBULATORY_CARE_PROVIDER_SITE_OTHER): Payer: Medicaid Other | Admitting: Obstetrics and Gynecology

## 2015-11-16 ENCOUNTER — Encounter: Payer: Self-pay | Admitting: Obstetrics and Gynecology

## 2015-11-16 VITALS — BP 119/70 | HR 64 | Temp 98.3°F | Ht 62.0 in | Wt 207.0 lb

## 2015-11-16 DIAGNOSIS — N730 Acute parametritis and pelvic cellulitis: Secondary | ICD-10-CM

## 2015-11-16 DIAGNOSIS — N760 Acute vaginitis: Secondary | ICD-10-CM | POA: Diagnosis not present

## 2015-11-16 NOTE — Patient Instructions (Signed)
Contraception Choices Contraception (birth control) is the use of any methods or devices to prevent pregnancy. Below are some methods to help avoid pregnancy. HORMONAL METHODS   Contraceptive implant. This is a thin, plastic tube containing progesterone hormone. It does not contain estrogen hormone. Your health care provider inserts the tube in the inner part of the upper arm. The tube can remain in place for up to 3 years. After 3 years, the implant must be removed. The implant prevents the ovaries from releasing an egg (ovulation), thickens the cervical mucus to prevent sperm from entering the uterus, and thins the lining of the inside of the uterus.  Progesterone-only injections. These injections are given every 3 months by your health care provider to prevent pregnancy. This synthetic progesterone hormone stops the ovaries from releasing eggs. It also thickens cervical mucus and changes the uterine lining. This makes it harder for sperm to survive in the uterus.  Birth control pills. These pills contain estrogen and progesterone hormone. They work by preventing the ovaries from releasing eggs (ovulation). They also cause the cervical mucus to thicken, preventing the sperm from entering the uterus. Birth control pills are prescribed by a health care provider.Birth control pills can also be used to treat heavy periods.  Minipill. This type of birth control pill contains only the progesterone hormone. They are taken every day of each month and must be prescribed by your health care provider.  Birth control patch. The patch contains hormones similar to those in birth control pills. It must be changed once a week and is prescribed by a health care provider.  Vaginal ring. The ring contains hormones similar to those in birth control pills. It is left in the vagina for 3 weeks, removed for 1 week, and then a new one is put back in place. The patient must be comfortable inserting and removing the ring  from the vagina.A health care provider's prescription is necessary.  Emergency contraception. Emergency contraceptives prevent pregnancy after unprotected sexual intercourse. This pill can be taken right after sex or up to 5 days after unprotected sex. It is most effective the sooner you take the pills after having sexual intercourse. Most emergency contraceptive pills are available without a prescription. Check with your pharmacist. Do not use emergency contraception as your only form of birth control. BARRIER METHODS   Female condom. This is a thin sheath (latex or rubber) that is worn over the penis during sexual intercourse. It can be used with spermicide to increase effectiveness.  Female condom. This is a soft, loose-fitting sheath that is put into the vagina before sexual intercourse.  Diaphragm. This is a soft, latex, dome-shaped barrier that must be fitted by a health care provider. It is inserted into the vagina, along with a spermicidal jelly. It is inserted before intercourse. The diaphragm should be left in the vagina for 6 to 8 hours after intercourse.  Cervical cap. This is a round, soft, latex or plastic cup that fits over the cervix and must be fitted by a health care provider. The cap can be left in place for up to 48 hours after intercourse.  Sponge. This is a soft, circular piece of polyurethane foam. The sponge has spermicide in it. It is inserted into the vagina after wetting it and before sexual intercourse.  Spermicides. These are chemicals that kill or block sperm from entering the cervix and uterus. They come in the form of creams, jellies, suppositories, foam, or tablets. They do not require a   prescription. They are inserted into the vagina with an applicator before having sexual intercourse. The process must be repeated every time you have sexual intercourse. INTRAUTERINE CONTRACEPTION  Intrauterine device (IUD). This is a T-shaped device that is put in a woman's uterus  during a menstrual period to prevent pregnancy. There are 2 types:  Copper IUD. This type of IUD is wrapped in copper wire and is placed inside the uterus. Copper makes the uterus and fallopian tubes produce a fluid that kills sperm. It can stay in place for 10 years.  Hormone IUD. This type of IUD contains the hormone progestin (synthetic progesterone). The hormone thickens the cervical mucus and prevents sperm from entering the uterus, and it also thins the uterine lining to prevent implantation of a fertilized egg. The hormone can weaken or kill the sperm that get into the uterus. It can stay in place for 3-5 years, depending on which type of IUD is used. PERMANENT METHODS OF CONTRACEPTION  Female tubal ligation. This is when the woman's fallopian tubes are surgically sealed, tied, or blocked to prevent the egg from traveling to the uterus.  Hysteroscopic sterilization. This involves placing a small coil or insert into each fallopian tube. Your doctor uses a technique called hysteroscopy to do the procedure. The device causes scar tissue to form. This results in permanent blockage of the fallopian tubes, so the sperm cannot fertilize the egg. It takes about 3 months after the procedure for the tubes to become blocked. You must use another form of birth control for these 3 months.  Female sterilization. This is when the female has the tubes that carry sperm tied off (vasectomy).This blocks sperm from entering the vagina during sexual intercourse. After the procedure, the man can still ejaculate fluid (semen). NATURAL PLANNING METHODS  Natural family planning. This is not having sexual intercourse or using a barrier method (condom, diaphragm, cervical cap) on days the woman could become pregnant.  Calendar method. This is keeping track of the length of each menstrual cycle and identifying when you are fertile.  Ovulation method. This is avoiding sexual intercourse during ovulation.  Symptothermal  method. This is avoiding sexual intercourse during ovulation, using a thermometer and ovulation symptoms.  Post-ovulation method. This is timing sexual intercourse after you have ovulated. Regardless of which type or method of contraception you choose, it is important that you use condoms to protect against the transmission of sexually transmitted infections (STIs). Talk with your health care provider about which form of contraception is most appropriate for you.   This information is not intended to replace advice given to you by your health care provider. Make sure you discuss any questions you have with your health care provider.   Document Released: 11/11/2005 Document Revised: 11/16/2013 Document Reviewed: 05/06/2013 Elsevier Interactive Patient Education 2016 [employer]  

## 2015-11-16 NOTE — Progress Notes (Signed)
Patient ID: Kim Johns, female   DOB: 1991-05-29, 24 y.o.   MRN: [id number] 24 yo recently treated for PID here for follow up. Patient reports feeling well since completing the antibiotic course. She reports that her vaginal bleeding has stopped. She is now on her second pack of Sprintec and is without complaints. She does report the presence of an odorless, non pruritic vaginal discharge.  Past Medical History  Diagnosis Date  . Allergy   . Asthma     albuterol last used a few months ago  . Preterm labor   . Abscess of breast    Past Surgical History  Procedure Laterality Date  . Wisdom tooth extraction    . Cervical cerclage N/A 04/05/2015    Procedure: CERCLAGE CERVICAL;  Surgeon: Lavonia Drafts, MD;  Location: [facility];  Service: Gynecology;  Laterality: N/A;   Family History  Problem Relation Age of Onset  . Diabetes Mother   . Stroke Mother   . Hypertension Father    Social History  Substance Use Topics  . Smoking status: Current Every Day Smoker -- 0.50 packs/day for 10 years    Types: Cigarettes  . Smokeless tobacco: Never Used  . Alcohol Use: No   ROS See pertinent in HPI  Blood pressure 119/70, pulse 64, temperature 98.3 F (36.8 C), temperature source Oral, height 5\' 2"  (1.575 m), weight 207 lb (93.895 kg), not currently breastfeeding. GENERAL: Well-developed, well-nourished female in no acute distress.  ABDOMEN: Soft, nontender, nondistended. No organomegaly. PELVIC: Normal external female genitalia. Vagina is pink and rugated.  Normal discharge. Normal appearing cervix. Uterus is normal in size. No adnexal mass or tenderness. EXTREMITIES: No cyanosis, clubbing, or edema, 2+ distal pulses.  A/P 24 yo s/p treatment for PID - Continue Sprintec. Patient may consider changing to NuvaRing - Wet prep collected - patient will be contacted with any abnormal results - RTC prn

## 2015-11-17 ENCOUNTER — Telehealth: Payer: Self-pay

## 2015-11-17 LAB — WET PREP, GENITAL
Trich, Wet Prep: NONE SEEN
YEAST WET PREP: NONE SEEN

## 2015-11-17 MED ORDER — METRONIDAZOLE 500 MG PO TABS: 500.0000 mg | ORAL_TABLET | Freq: Two times a day (BID) | ORAL | Status: DC

## 2015-11-17 NOTE — Telephone Encounter (Signed)
Called pt and LM that there is an Rx sent to your [facility] to please pick up Rx.  That our office is closed and will not reopen until Tuesday due to holidays.

## 2015-11-17 NOTE — Telephone Encounter (Signed)
Per Dr. Elly Modena, pt needs tx for BV.  Flagyl 500 mg po bid x 7 days e-prescribed.

## 2015-11-17 NOTE — Telephone Encounter (Signed)
Pt called requesting test results for her wet prep.

## 2015-12-03 ENCOUNTER — Inpatient Hospital Stay (HOSPITAL_COMMUNITY)
Admission: EM | Admit: 2015-12-03 | Discharge: 2015-12-03 | Disposition: A | Discharge status: Home / Self Care | Payer: Self-pay | Source: Ambulatory Visit | Attending: Obstetrics and Gynecology | Admitting: Obstetrics and Gynecology

## 2015-12-03 ENCOUNTER — Encounter (HOSPITAL_COMMUNITY): Payer: Self-pay | Admitting: *Deleted

## 2015-12-03 DIAGNOSIS — F17210 Nicotine dependence, cigarettes, uncomplicated: Secondary | ICD-10-CM | POA: Insufficient documentation

## 2015-12-03 DIAGNOSIS — N898 Other specified noninflammatory disorders of vagina: Secondary | ICD-10-CM | POA: Insufficient documentation

## 2015-12-03 LAB — URINE MICROSCOPIC-ADD ON

## 2015-12-03 LAB — WET PREP, GENITAL
CLUE CELLS WET PREP: NONE SEEN
SPERM: NONE SEEN
Trich, Wet Prep: NONE SEEN
Yeast Wet Prep HPF POC: NONE SEEN

## 2015-12-03 LAB — URINALYSIS, ROUTINE W REFLEX MICROSCOPIC
BILIRUBIN URINE: NEGATIVE
GLUCOSE, UA: NEGATIVE mg/dL
HGB URINE DIPSTICK: NEGATIVE
Ketones, ur: NEGATIVE mg/dL
Nitrite: NEGATIVE
PROTEIN: NEGATIVE mg/dL
Specific Gravity, Urine: 1.025 (ref 1.005–1.030)
pH: 6 (ref 5.0–8.0)

## 2015-12-03 LAB — POCT PREGNANCY, URINE: PREG TEST UR: NEGATIVE

## 2015-12-03 NOTE — [facility] Note (Signed)
Patient having discharge and urinary frequency x 3 to 4 days.  LMP 11/23/15 taking BCP

## 2015-12-03 NOTE — MAU Provider Note (Signed)
History     CSN: [id number]  Arrival date and time: 12/03/15 [id number]   First Provider Initiated Contact with Patient 12/03/15 1853      Chief Complaint  Patient presents with  . Vaginal Discharge  . Urinary Frequency   HPI   Ms.Kim Johns is a 25 y.o. female G2P1101 presenting to MAU with concerns about vaginal discharge. She was recently admitted to the hospital and treated for PID and then at her follow up she was treated again for BV. She feels she may have BV again. The discharge is described as Non-Odorous discharge, however an increase in the amount of discharge.  The discharge is green and mucus like.   She has occasional abdominal pain; none now.  She rates her pain 0/10  She is a patient of the clinic downstairs.   The last time she had sex was last month- one partner.   Per the chart review: Vaginal delivery 10/6 IUD placed 11/7 Diagnosed with PID and bilateral TOA 11/29, admitted to the hospital for IV antibiotics.  IUD removed 12/1  OB History    Gravida Para Term Preterm AB TAB SAB Ectopic Multiple Living   2 2 1 1  0 0 0 0 0 1      Past Medical History  Diagnosis Date  . Allergy   . Asthma     albuterol last used a few months ago  . Preterm labor   . Abscess of breast     Past Surgical History  Procedure Laterality Date  . Wisdom tooth extraction    . Cervical cerclage N/A 04/05/2015    Procedure: CERCLAGE CERVICAL;  Surgeon: Lavonia Drafts, MD;  Location: [facility];  Service: Gynecology;  Laterality: N/A;    Family History  Problem Relation Age of Onset  . Diabetes Mother   . Stroke Mother   . Hypertension Father     Social History  Substance Use Topics  . Smoking status: Current Every Day Smoker -- 0.50 packs/day for 10 years    Types: Cigarettes  . Smokeless tobacco: Never Used  . Alcohol Use: No    Allergies:  Allergies  Allergen Reactions  . Shellfish Allergy Hives  . Sulfa Antibiotics Other (See Comments)    Reaction:   Unknown; childhood reaction  . Icy Hot Rash    Prescriptions prior to admission  Medication Sig Dispense Refill Last Dose  . albuterol (PROVENTIL HFA;VENTOLIN HFA) 108 (90 BASE) MCG/ACT inhaler Inhale 2 puffs into the lungs every 6 (six) hours as needed for wheezing or shortness of breath. Use spacer 1 Inhaler 2 Taking  . cyclobenzaprine (FLEXERIL) 10 MG tablet Take 1 tablet (10 mg total) by mouth 3 (three) times daily as needed for muscle spasms. 30 tablet 3 Taking  . ibuprofen (ADVIL,MOTRIN) 600 MG tablet Take 1 tablet (600 mg total) by mouth every 6 (six) hours as needed for mild pain or moderate pain (mild pain). (Patient not taking: Reported on 11/16/2015) 60 tablet 2 Not Taking  . metroNIDAZOLE (FLAGYL) 500 MG tablet Take 1 tablet (500 mg total) by mouth 2 (two) times daily. 14 tablet 0   . norgestimate-ethinyl estradiol (ORTHO-CYCLEN,SPRINTEC,PREVIFEM) 0.25-35 MG-MCG tablet Take 1 tablet by mouth daily. Only take active pills continuously 3 Package 5 Taking  . oxyCODONE-acetaminophen (PERCOCET/ROXICET) 5-325 MG tablet Take 1-2 tablets by mouth every 3 (three) hours as needed (moderate to severe pain (when tolerating fluids)). (Patient not taking: Reported on 11/16/2015) 45 tablet 0 Not Taking   Results for orders  placed or performed during the hospital encounter of 12/03/15 (from the past 48 hour(s))  Urinalysis, Routine w reflex microscopic (not at [facility])     Status: Abnormal   Collection Time: 12/03/15  6:35 PM  Result Value Ref Range   Color, Urine YELLOW YELLOW   APPearance CLEAR CLEAR   Specific Gravity, Urine 1.025 1.005 - 1.030   pH 6.0 5.0 - 8.0   Glucose, UA NEGATIVE NEGATIVE mg/dL   Hgb urine dipstick NEGATIVE NEGATIVE   Bilirubin Urine NEGATIVE NEGATIVE   Ketones, ur NEGATIVE NEGATIVE mg/dL   Protein, ur NEGATIVE NEGATIVE mg/dL   Nitrite NEGATIVE NEGATIVE   Leukocytes, UA SMALL (A) NEGATIVE  Urine microscopic-add on     Status: Abnormal   Collection Time: 12/03/15  6:35  PM  Result Value Ref Range   Squamous Epithelial / LPF 0-5 (A) NONE SEEN   WBC, UA 0-5 0 - 5 WBC/hpf   RBC / HPF 0-5 0 - 5 RBC/hpf   Bacteria, UA FEW (A) NONE SEEN  Pregnancy, urine POC     Status: None   Collection Time: 12/03/15  6:46 PM  Result Value Ref Range   Preg Test, Ur NEGATIVE NEGATIVE    Comment:        THE SENSITIVITY OF THIS METHODOLOGY IS >24 mIU/mL   Wet prep, genital     Status: Abnormal   Collection Time: 12/03/15  7:05 PM  Result Value Ref Range   Yeast Wet Prep HPF POC NONE SEEN NONE SEEN   Trich, Wet Prep NONE SEEN NONE SEEN   Clue Cells Wet Prep HPF POC NONE SEEN NONE SEEN   WBC, Wet Prep HPF POC MANY (A) NONE SEEN    Comment: MODERATE BACTERIA SEEN   Sperm NONE SEEN     Review of Systems  Constitutional: Negative for fever and chills.  Gastrointestinal: Negative for abdominal pain (On occasion ).  Genitourinary: Positive for urgency and frequency. Negative for dysuria.   Physical Exam   Blood pressure 129/65, pulse 72, temperature 98.4 F (36.9 C), temperature source Oral, resp. rate 18, height 5\' 2"  (1.575 m), weight 210 lb 12.8 oz (95.618 kg), last menstrual period 11/23/2015, not currently breastfeeding.  Physical Exam  Constitutional: She is oriented to person, place, and time. She appears well-developed and well-nourished. No distress.  HENT:  Head: Normocephalic.  Respiratory: Effort normal.  GI: Soft. She exhibits no distension. There is no tenderness. There is no rebound and no guarding.  Genitourinary:  Speculum exam: Vagina -Moderate amount of light green/pale yellow stringy, mucus like discharge Cervix - No contact bleeding Bimanual exam: Cervix closed, no CMT  Uterus non tender, normal size Adnexa non tender, no masses bilaterally GC/Chlam, wet prep done Chaperone present for exam.  Musculoskeletal: Normal range of motion.  Neurological: She is alert and oriented to person, place, and time.  Skin: Skin is warm. She is not  diaphoretic.  Psychiatric: Her behavior is normal.    MAU Course  Procedures  None  MDM GC-Pending, will wait for results to treat if needed Discussed HPI, history, lab results and physical exam with Dr. Elly Modena.    Assessment and Plan   A:  1. Vaginal discharge     P:  Discharge home in stable condition No RX given today Patient to follow up in the [place] Return to MAU if symptoms worsen    Lezlie Lye, NP 12/07/2015 8:30 AM

## 2015-12-04 LAB — GC/CHLAMYDIA PROBE AMP (~~LOC~~) NOT AT ARMC
Chlamydia: NEGATIVE
Neisseria Gonorrhea: NEGATIVE

## 2016-01-01 ENCOUNTER — Telehealth: Payer: Self-pay | Admitting: General Practice

## 2016-01-01 NOTE — Telephone Encounter (Signed)
Patient called and left message stating she is calling for a herpes check. Patient states she was today she had to have an active infection to be swabbed. Called patient, no answer- left message stating we are trying to reach you to return your phone call, please call us back at the clinics

## 2016-01-02 ENCOUNTER — Ambulatory Visit (INDEPENDENT_AMBULATORY_CARE_PROVIDER_SITE_OTHER): Payer: Medicaid Other | Admitting: Student

## 2016-01-02 ENCOUNTER — Other Ambulatory Visit (HOSPITAL_COMMUNITY)
Admission: RE | Admit: 2016-01-02 | Discharge: 2016-01-02 | Disposition: A | Discharge status: Home / Self Care | Payer: Medicaid Other | Source: Ambulatory Visit | Attending: Student | Admitting: Student

## 2016-01-02 ENCOUNTER — Encounter: Payer: Self-pay | Admitting: Student

## 2016-01-02 ENCOUNTER — Other Ambulatory Visit (HOSPITAL_COMMUNITY): Payer: Self-pay | Admitting: Student

## 2016-01-02 VITALS — BP 134/88 | HR 71 | Temp 98.7°F | Wt 212.3 lb

## 2016-01-02 DIAGNOSIS — A6009 Herpesviral infection of other urogenital tract: Secondary | ICD-10-CM

## 2016-01-02 DIAGNOSIS — N898 Other specified noninflammatory disorders of vagina: Secondary | ICD-10-CM | POA: Diagnosis present

## 2016-01-02 DIAGNOSIS — Z113 Encounter for screening for infections with a predominantly sexual mode of transmission: Secondary | ICD-10-CM | POA: Insufficient documentation

## 2016-01-02 MED ORDER — VALACYCLOVIR HCL 1 G PO TABS: 1000.0000 mg | ORAL_TABLET | Freq: Two times a day (BID) | ORAL | Status: DC

## 2016-01-02 MED FILL — ?VALACYCLOVIR HCL 1 GRAM TA: 1 | 10 days supply | Qty: 20 | Fill #0

## 2016-01-02 NOTE — Telephone Encounter (Addendum)
Pt returned our call yesterday @ 1507 and left message stating that she did not get the information she needed. Please call back.   2/7  1130  Called pt and discussed her concern. She stated that she has a bump on her inner vagina and thinks it may be herpes. She has had this type of bumps in the past but did not receive testing at the time. She says she was told that she needs testing when the bump is present. Pt states that it looks and feels like the cold sores which she gets occasionally. I offered pt appt for 1240 today and she agreed.

## 2016-01-02 NOTE — Progress Notes (Signed)
   Subjective:    Patient ID: Kim Johns, female    DOB: 1991/08/16, 25 y.o.   MRN: [id number] Report lesion on vagina x 2 days. Never had this symptoms before. Sexually active. Denies STD symptoms of partner  Vaginal Pain The patient's primary symptoms include genital lesions and vaginal discharge. The patient's pertinent negatives include no genital odor, pelvic pain or vaginal bleeding. This is a new problem. Episode onset: x 2 days. The problem has been gradually worsening. The problem affects the right side. She is not pregnant. Associated symptoms include abdominal pain (mild abdominal cramping). Pertinent negatives include no chills, dysuria, fever, frequency, hematuria, nausea, painful intercourse or urgency. The vaginal discharge was yellow and thick. There has been no bleeding. Nothing aggravates the symptoms. She has tried nothing for the symptoms. She is sexually active. No, her partner does not have an STD. She uses oral contraceptives for contraception. Her menstrual history has been regular. Her past medical history is significant for PID (r/t IUD. Admitted 09/2015. IUD removed at that time), an STD and vaginosis. There is no history of herpes simplex.      Review of Systems  Constitutional: Negative.  Negative for fever and chills.  Gastrointestinal: Positive for abdominal pain (mild abdominal cramping). Negative for nausea.  Genitourinary: Positive for vaginal discharge, genital sores and vaginal pain. Negative for dysuria, urgency, frequency, hematuria, vaginal bleeding, menstrual problem, pelvic pain and dyspareunia.       Objective:   Physical Exam  Constitutional: She appears well-developed and well-nourished. No distress.  HENT:  Head: Normocephalic and atraumatic.  Pulmonary/Chest: Effort normal. No respiratory distress.  Abdominal: Soft. She exhibits no distension. There is no tenderness.  Genitourinary: Uterus normal.    There is lesion (several pinpoint sized  vesicles that have coalesced for form lesion that's approximately 1 cm) on the right labia. Cervix exhibits discharge (moderated amount of tan mucoid discharge. no odor noted). Cervix exhibits no motion tenderness and no friability. Right adnexum displays no mass, no tenderness and no fullness. Left adnexum displays no mass, no tenderness and no fullness.  Skin: She is not diaphoretic.       Assessment & Plan:   1. Vaginal sore  -Suspicious for HSV. Rx valtrex & HSV culture sent - Wet prep, genital - Herpes simplex virus culture - valACYclovir (VALTREX) 1000 MG tablet; Take 1 tablet (1,000 mg total) by mouth 2 (two) times daily.  Dispense: 20 tablet; Refill: 1  2. Vaginal discharge  -wet prep & gc/ct   Jorje Guild, NP

## 2016-01-02 NOTE — Patient Instructions (Signed)

## 2016-01-03 LAB — WET PREP, GENITAL
Clue Cells Wet Prep HPF POC: NONE SEEN
Trich, Wet Prep: NONE SEEN
YEAST WET PREP: NONE SEEN

## 2016-01-03 LAB — GC/CHLAMYDIA PROBE AMP (~~LOC~~) NOT AT ARMC
CHLAMYDIA, DNA PROBE: NEGATIVE
Neisseria Gonorrhea: NEGATIVE

## 2016-01-04 ENCOUNTER — Telehealth: Payer: Self-pay | Admitting: *Deleted

## 2016-01-04 NOTE — Telephone Encounter (Signed)
Received call from [facility] lab Alwyn Ren). She stated that there is a delay for the Herpes Culture result which was obtained 2/7. The reason is a technical issue on their end. The test should be resulted tomorrow.

## 2016-01-05 LAB — HERPES SIMPLEX VIRUS CULTURE: ORGANISM ID, BACTERIA: DETECTED

## 2016-01-08 ENCOUNTER — Telehealth: Payer: Self-pay

## 2016-01-08 NOTE — Telephone Encounter (Signed)
Returned call for results 

## 2016-01-08 NOTE — Telephone Encounter (Signed)
Please inform pt of her HSV result.     GC/CT negative Already given rx for valtrex   I have left message for patient to call us back concerning lab work.

## 2016-01-09 NOTE — Telephone Encounter (Signed)
Attempted to contact pt and LM that we will send a letter to her address that we have on file.   Pt returned call and I informed her the results of HSV was + to continue to Valtrex as prescribed and that her GC/CT was negative.  Pt stated understanding with no further questions.

## 2016-01-15 ENCOUNTER — Telehealth: Payer: Self-pay | Admitting: *Deleted

## 2016-01-15 NOTE — Telephone Encounter (Signed)
Kim Johns called and left a message today that she needs a refill on her sprintec. Per chart review should not need new rx yet-  Had rx for refill 3 packages at a time with 5 refills.  Called [facility] and i notified her she should have refills left. She states she didn't know she had refills, she will call pharmacy.

## 2016-01-29 ENCOUNTER — Inpatient Hospital Stay (HOSPITAL_COMMUNITY)
Admission: AD | Admit: 2016-01-29 | Discharge: 2016-01-29 | Disposition: A | Discharge status: Home / Self Care | Payer: Medicaid Other | Source: Ambulatory Visit | Attending: Family Medicine | Admitting: Family Medicine

## 2016-01-29 ENCOUNTER — Encounter (HOSPITAL_COMMUNITY): Payer: Self-pay | Admitting: *Deleted

## 2016-01-29 DIAGNOSIS — N760 Acute vaginitis: Secondary | ICD-10-CM | POA: Insufficient documentation

## 2016-01-29 DIAGNOSIS — Z91013 Allergy to seafood: Secondary | ICD-10-CM | POA: Insufficient documentation

## 2016-01-29 DIAGNOSIS — N898 Other specified noninflammatory disorders of vagina: Secondary | ICD-10-CM

## 2016-01-29 DIAGNOSIS — J45909 Unspecified asthma, uncomplicated: Secondary | ICD-10-CM | POA: Diagnosis not present

## 2016-01-29 DIAGNOSIS — B009 Herpesviral infection, unspecified: Secondary | ICD-10-CM | POA: Insufficient documentation

## 2016-01-29 DIAGNOSIS — B9689 Other specified bacterial agents as the cause of diseases classified elsewhere: Secondary | ICD-10-CM

## 2016-01-29 DIAGNOSIS — A499 Bacterial infection, unspecified: Secondary | ICD-10-CM | POA: Diagnosis not present

## 2016-01-29 DIAGNOSIS — F17210 Nicotine dependence, cigarettes, uncomplicated: Secondary | ICD-10-CM | POA: Diagnosis not present

## 2016-01-29 HISTORY — DX: Herpesviral infection, unspecified: B00.9

## 2016-01-29 LAB — CBC
HEMATOCRIT: 39.6 % (ref 36.0–46.0)
Hemoglobin: 14.1 g/dL (ref 12.0–15.0)
MCH: 32.9 pg (ref 26.0–34.0)
MCHC: 35.6 g/dL (ref 30.0–36.0)
MCV: 92.3 fL (ref 78.0–100.0)
Platelets: 283 10*3/uL (ref 150–400)
RBC: 4.29 MIL/uL (ref 3.87–5.11)
RDW: 13.7 % (ref 11.5–15.5)
WBC: 8.1 10*3/uL (ref 4.0–10.5)

## 2016-01-29 LAB — URINE MICROSCOPIC-ADD ON: RBC / HPF: NONE SEEN RBC/hpf (ref 0–5)

## 2016-01-29 LAB — URINALYSIS, ROUTINE W REFLEX MICROSCOPIC
Bilirubin Urine: NEGATIVE
Glucose, UA: NEGATIVE mg/dL
Hgb urine dipstick: NEGATIVE
KETONES UR: NEGATIVE mg/dL
NITRITE: NEGATIVE
PROTEIN: NEGATIVE mg/dL
Specific Gravity, Urine: 1.025 (ref 1.005–1.030)
pH: 6 (ref 5.0–8.0)

## 2016-01-29 LAB — WET PREP, GENITAL
CLUE CELLS WET PREP: NONE SEEN
Sperm: NONE SEEN
Trich, Wet Prep: NONE SEEN
Yeast Wet Prep HPF POC: NONE SEEN

## 2016-01-29 LAB — POCT PREGNANCY, URINE: Preg Test, Ur: NEGATIVE

## 2016-01-29 MED ORDER — METRONIDAZOLE 500 MG PO TABS: 500.0000 mg | ORAL_TABLET | Freq: Two times a day (BID) | ORAL | Status: DC

## 2016-01-29 NOTE — Discharge Instructions (Signed)

## 2016-01-29 NOTE — MAU Provider Note (Signed)
History     CSN: [id number]  Arrival date and time: 01/29/16 1503   First Provider Initiated Contact with Patient 01/29/16 1550      Chief Complaint  Patient presents with  . Vaginal Discharge   HPI Kim Johns is a 25 y.o. female who presents for vaginal discharge. PMH significant for PID & TOA in December.  Reports thick off-white vaginal discharge with foul odor. Denies abdominal pain, n/v/d, fever, painful intercourse, or post coital bleeding.   OB History    Gravida Para Term Preterm AB TAB SAB Ectopic Multiple Living   2 2 1 1  0 0 0 0 0 1      Past Medical History  Diagnosis Date  . Allergy   . Asthma     albuterol last used a few months ago  . Preterm labor   . Abscess of breast   . HSV-2 (herpes simplex virus 2) infection     Past Surgical History  Procedure Laterality Date  . Wisdom tooth extraction    . Cervical cerclage N/A 04/05/2015    Procedure: CERCLAGE CERVICAL;  Surgeon: Lavonia Drafts, MD;  Location: [facility];  Service: Gynecology;  Laterality: N/A;    Family History  Problem Relation Age of Onset  . Diabetes Mother   . Stroke Mother   . Hypertension Father     Social History  Substance Use Topics  . Smoking status: Current Every Day Smoker -- 0.50 packs/day for 10 years    Types: Cigarettes  . Smokeless tobacco: Never Used  . Alcohol Use: No    Allergies:  Allergies  Allergen Reactions  . Shellfish Allergy Hives  . Sulfa Antibiotics Other (See Comments)    Reaction:  Unknown; childhood reaction  . Icy Hot Rash    No prescriptions prior to admission    Review of Systems  Constitutional: Negative.   Gastrointestinal: Negative.   Genitourinary: Negative for dysuria.       + vaginal discharge   Physical Exam   Blood pressure 133/85, pulse 69, temperature 98.2 F (36.8 C), temperature source Oral, resp. rate 18, height 5\' 2"  (1.575 m), weight 209 lb (94.802 kg), last menstrual period 01/15/2016, not currently  breastfeeding.  Physical Exam  Nursing note and vitals reviewed. Constitutional: She is oriented to person, place, and time. She appears well-developed and well-nourished. No distress.  HENT:  Head: Normocephalic and atraumatic.  Eyes: Conjunctivae are normal. Right eye exhibits no discharge. Left eye exhibits no discharge. No scleral icterus.  Neck: Normal range of motion.  Cardiovascular: Normal rate, regular rhythm and normal heart sounds.   No murmur heard. Respiratory: Effort normal and breath sounds normal. No respiratory distress. She has no wheezes.  GI: Soft. She exhibits no distension. There is no tenderness.  Genitourinary: Vaginal discharge (moderate amount of thin gray discharge) found.  Neurological: She is alert and oriented to person, place, and time.  Skin: Skin is warm and dry. She is not diaphoretic.  Psychiatric: She has a normal mood and affect. Her behavior is normal. Judgment and thought content normal.    MAU Course  Procedures Results for orders placed or performed during the hospital encounter of 01/29/16 (from the past 24 hour(s))  Urinalysis, Routine w reflex microscopic (not at [facility])     Status: Abnormal   Collection Time: 01/29/16  3:10 PM  Result Value Ref Range   Color, Urine YELLOW YELLOW   APPearance CLEAR CLEAR   Specific Gravity, Urine 1.025 1.005 - 1.030  pH 6.0 5.0 - 8.0   Glucose, UA NEGATIVE NEGATIVE mg/dL   Hgb urine dipstick NEGATIVE NEGATIVE   Bilirubin Urine NEGATIVE NEGATIVE   Ketones, ur NEGATIVE NEGATIVE mg/dL   Protein, ur NEGATIVE NEGATIVE mg/dL   Nitrite NEGATIVE NEGATIVE   Leukocytes, UA SMALL (A) NEGATIVE  Urine microscopic-add on     Status: Abnormal   Collection Time: 01/29/16  3:10 PM  Result Value Ref Range   Squamous Epithelial / LPF 0-5 (A) NONE SEEN   WBC, UA 0-5 0 - 5 WBC/hpf   RBC / HPF NONE SEEN 0 - 5 RBC/hpf   Bacteria, UA FEW (A) NONE SEEN   Urine-Other MUCOUS PRESENT   Pregnancy, urine POC     Status: None    Collection Time: 01/29/16  3:41 PM  Result Value Ref Range   Preg Test, Ur NEGATIVE NEGATIVE  CBC     Status: None   Collection Time: 01/29/16  4:49 PM  Result Value Ref Range   WBC 8.1 4.0 - 10.5 K/uL   RBC 4.29 3.87 - 5.11 MIL/uL   Hemoglobin 14.1 12.0 - 15.0 g/dL   HCT 39.6 36.0 - 46.0 %   MCV 92.3 78.0 - 100.0 fL   MCH 32.9 26.0 - 34.0 pg   MCHC 35.6 30.0 - 36.0 g/dL   RDW 13.7 11.5 - 15.5 %   Platelets 283 150 - 400 K/uL  Wet prep, genital     Status: Abnormal   Collection Time: 01/29/16  4:55 PM  Result Value Ref Range   Yeast Wet Prep HPF POC NONE SEEN NONE SEEN   Trich, Wet Prep NONE SEEN NONE SEEN   Clue Cells Wet Prep HPF POC NONE SEEN NONE SEEN   WBC, Wet Prep HPF POC MANY (A) NONE SEEN   Sperm NONE SEEN     MDM UPT negative Based on exam & pt complaint will treat for BV  Assessment and Plan  A: 1. BV (bacterial vaginosis)   2. Vaginal discharge     P: Discharge home Rx flagyl (no alcohol) Encouraged condom use OTC probiotics GC/CT pending  Jorje Guild 01/29/2016, 8:09 PM

## 2016-01-29 NOTE — MAU Note (Signed)
Patient states she had an IUD placed in December and since then she has been having white vaginal discharge and it has gotten a foul odor recently. "I think it might be a yeast infection and bacterial vaginosis."

## 2016-01-30 LAB — GC/CHLAMYDIA PROBE AMP (~~LOC~~) NOT AT ARMC
Chlamydia: NEGATIVE
NEISSERIA GONORRHEA: NEGATIVE

## 2016-02-08 ENCOUNTER — Telehealth: Payer: Self-pay | Admitting: *Deleted

## 2016-02-08 NOTE — Telephone Encounter (Addendum)
Pt states that she is having bleeding during the middle of her birth control pills. Would like a call back to discuss this. Spoke with Dr. Elly Modena about patients symptoms, she recommends that patient start new pack of pills when inactive pills come up in the pack. If bleeding is persistant patient advised to call us back. Pt voiced understanding and had no further questions.

## 2016-02-12 ENCOUNTER — Telehealth: Payer: Self-pay

## 2016-02-12 NOTE — Telephone Encounter (Signed)
Patient called she has had her period for about 10 day now and it seems like it is not getting any lighter, she was told she could double up on her birth control to help the bleeding but she lost her birth control wants to speak to a nurse to see what else she can do.

## 2016-02-13 NOTE — Telephone Encounter (Signed)
Called pt and discussed her concern. She states that she is taking the placebo pills during her current pack of OCP's and has 2 pills left. She has been bleeding for 2 weeks and wants to know what she can do about it. I advised that she is having breakthrough bleeding and needs to start the next pack of pills. The bleeding should slow down or stop in about a week. She has lost the next 2 packs of pills that she was given from her pharmacy. I advised pt that her insurance will not cover the next refill yet. She can pay out of pocket for the packs that she lost and then her insurance will resume payment. Pt voiced understanding.

## 2016-02-15 MED FILL — MONO-LINYAH 28 TABLET: 0.25-35 | 28 days supply | Qty: 28 | Fill #0

## 2016-02-20 ENCOUNTER — Encounter (HOSPITAL_COMMUNITY): Payer: Self-pay | Admitting: Family Medicine

## 2016-02-20 ENCOUNTER — Emergency Department (HOSPITAL_COMMUNITY)
Admission: EM | Admit: 2016-02-20 | Discharge: 2016-02-20 | Disposition: A | Discharge status: Home / Self Care | Payer: Medicaid Other | Attending: Emergency Medicine | Admitting: Emergency Medicine

## 2016-02-20 DIAGNOSIS — Z793 Long term (current) use of hormonal contraceptives: Secondary | ICD-10-CM | POA: Insufficient documentation

## 2016-02-20 DIAGNOSIS — J029 Acute pharyngitis, unspecified: Secondary | ICD-10-CM | POA: Diagnosis not present

## 2016-02-20 DIAGNOSIS — J45909 Unspecified asthma, uncomplicated: Secondary | ICD-10-CM | POA: Insufficient documentation

## 2016-02-20 DIAGNOSIS — Z79899 Other long term (current) drug therapy: Secondary | ICD-10-CM | POA: Diagnosis not present

## 2016-02-20 DIAGNOSIS — F17210 Nicotine dependence, cigarettes, uncomplicated: Secondary | ICD-10-CM | POA: Insufficient documentation

## 2016-02-20 DIAGNOSIS — L02416 Cutaneous abscess of left lower limb: Secondary | ICD-10-CM | POA: Insufficient documentation

## 2016-02-20 DIAGNOSIS — Z8619 Personal history of other infectious and parasitic diseases: Secondary | ICD-10-CM | POA: Insufficient documentation

## 2016-02-20 DIAGNOSIS — Z8742 Personal history of other diseases of the female genital tract: Secondary | ICD-10-CM | POA: Diagnosis not present

## 2016-02-20 DIAGNOSIS — L02415 Cutaneous abscess of right lower limb: Secondary | ICD-10-CM | POA: Diagnosis not present

## 2016-02-20 LAB — RAPID STREP SCREEN (MED CTR MEBANE ONLY): Streptococcus, Group A Screen (Direct): NEGATIVE

## 2016-02-20 MED ORDER — DOXYCYCLINE HYCLATE 100 MG PO CAPS: 100.0000 mg | ORAL_CAPSULE | Freq: Two times a day (BID) | ORAL | Status: DC

## 2016-02-20 NOTE — Discharge Instructions (Signed)

## 2016-02-20 NOTE — ED Provider Notes (Signed)
CSN: [id number]     Arrival date & time 02/20/16  [id number] History   First MD Initiated Contact with Patient 02/20/16 [phone]     Chief Complaint  Patient presents with  . Sore Throat  . Abscess     (Consider location/radiation/quality/duration/timing/severity/associated sxs/prior Treatment) Patient is a 25 y.o. female presenting with pharyngitis and abscess. The history is provided by the patient. No language interpreter was used.  Sore Throat This is a new problem. The current episode started yesterday. The problem has been gradually worsening. Pertinent negatives include no vomiting. Nothing aggravates the symptoms. She has tried nothing for the symptoms. The treatment provided no relief.  Abscess Associated symptoms: no vomiting   Pt also complains of skin abscesses  Past Medical History  Diagnosis Date  . Allergy   . Asthma     albuterol last used a few months ago  . Preterm labor   . Abscess of breast   . HSV-2 (herpes simplex virus 2) infection    Past Surgical History  Procedure Laterality Date  . Wisdom tooth extraction    . Cervical cerclage N/A 04/05/2015    Procedure: CERCLAGE CERVICAL;  Surgeon: Lavonia Drafts, MD;  Location: [facility];  Service: Gynecology;  Laterality: N/A;   Family History  Problem Relation Age of Onset  . Diabetes Mother   . Stroke Mother   . Hypertension Father    Social History  Substance Use Topics  . Smoking status: Current Every Day Smoker -- 0.50 packs/day for 10 years    Types: Cigarettes  . Smokeless tobacco: Never Used  . Alcohol Use: No   OB History    Gravida Para Term Preterm AB TAB SAB Ectopic Multiple Living   2 2 1 1  0 0 0 0 0 1     Review of Systems  Gastrointestinal: Negative for vomiting.  All other systems reviewed and are negative.     Allergies  Shellfish allergy; Sulfa antibiotics; and Icy hot  Home Medications   Prior to Admission medications   Medication Sig Start Date End Date Taking? Authorizing  Provider  albuterol (PROVENTIL HFA;VENTOLIN HFA) 108 (90 BASE) MCG/ACT inhaler Inhale 2 puffs into the lungs every 6 (six) hours as needed for wheezing or shortness of breath. Use spacer 07/01/15   Gwynne Edinger, MD  doxycycline (VIBRAMYCIN) 100 MG capsule Take 1 capsule (100 mg total) by mouth 2 (two) times daily. 02/20/16   Fransico Meadow, PA-C  metroNIDAZOLE (FLAGYL) 500 MG tablet Take 1 tablet (500 mg total) by mouth 2 (two) times daily. 01/29/16   Jorje Guild, NP  norgestimate-ethinyl estradiol (ORTHO-CYCLEN,SPRINTEC,PREVIFEM) 0.25-35 MG-MCG tablet Take 1 tablet by mouth daily. Only take active pills continuously 10/28/15   Osborne Oman, MD  valACYclovir (VALTREX) 1000 MG tablet Take 1 tablet (1,000 mg total) by mouth 2 (two) times daily. Patient taking differently: Take 1,000 mg by mouth 2 (two) times daily as needed (breakout).  01/02/16   Jorje Guild, NP   BP 114/76 mmHg  Pulse 85  Temp(Src) 98.3 F (36.8 C) (Oral)  Resp 18  SpO2 97%  LMP 02/12/2016 Physical Exam  Constitutional: She is oriented to person, place, and time. She appears well-developed and well-nourished.  HENT:  Head: Normocephalic and atraumatic.  Right Ear: External ear normal.  Left Ear: External ear normal.  Eyes: Conjunctivae and EOM are normal. Pupils are equal, round, and reactive to light.  Neck: Normal range of motion.  Cardiovascular: Normal rate and normal heart sounds.  Pulmonary/Chest: Effort normal.  Abdominal: Soft. She exhibits no distension.  Musculoskeletal: Normal range of motion.  Neurological: She is alert and oriented to person, place, and time.  Skin: Skin is warm.  Psychiatric: She has a normal mood and affect.  Nursing note and vitals reviewed.   ED Course  Procedures (including critical care time) Labs Review Labs Reviewed  RAPID STREP SCREEN (NOT AT [facility])  CULTURE, GROUP A STREP [facility])    Imaging Review No results found. I have personally reviewed and evaluated these  images and lab results as part of my medical decision-making.   EKG Interpretation None      MDM   Final diagnoses:  Multiple abscesses of both legs  Pharyngitis    Meds ordered this encounter  Medications  . doxycycline (VIBRAMYCIN) 100 MG capsule    Sig: Take 1 capsule (100 mg total) by mouth 2 (two) times daily.    Dispense:  20 capsule    Refill:  0    Order Specific Question:  Supervising Provider    Answer:  Noemi Chapel [[id number]]  An After Visit Summary was printed and given to the patient.    Hollace Kinnier Byron, PA-C 02/20/16 0830  Gareth Morgan, MD 02/26/16 1420

## 2016-02-20 NOTE — ED Notes (Signed)
Pt here for sore throat, and sts boils on her body.

## 2016-02-22 LAB — CULTURE, GROUP A STREP (THRC)

## 2016-02-27 ENCOUNTER — Emergency Department (HOSPITAL_COMMUNITY): Payer: Medicaid Other

## 2016-02-27 ENCOUNTER — Encounter (HOSPITAL_COMMUNITY): Payer: Self-pay

## 2016-02-27 ENCOUNTER — Emergency Department (HOSPITAL_COMMUNITY)
Admission: EM | Admit: 2016-02-27 | Discharge: 2016-02-27 | Disposition: A | Discharge status: Home / Self Care | Payer: Medicaid Other | Attending: Emergency Medicine | Admitting: Emergency Medicine

## 2016-02-27 DIAGNOSIS — M542 Cervicalgia: Secondary | ICD-10-CM | POA: Insufficient documentation

## 2016-02-27 DIAGNOSIS — Z8751 Personal history of pre-term labor: Secondary | ICD-10-CM | POA: Insufficient documentation

## 2016-02-27 DIAGNOSIS — Z792 Long term (current) use of antibiotics: Secondary | ICD-10-CM | POA: Insufficient documentation

## 2016-02-27 DIAGNOSIS — R350 Frequency of micturition: Secondary | ICD-10-CM | POA: Diagnosis not present

## 2016-02-27 DIAGNOSIS — Z79899 Other long term (current) drug therapy: Secondary | ICD-10-CM | POA: Diagnosis not present

## 2016-02-27 DIAGNOSIS — J45909 Unspecified asthma, uncomplicated: Secondary | ICD-10-CM | POA: Diagnosis not present

## 2016-02-27 DIAGNOSIS — M62830 Muscle spasm of back: Secondary | ICD-10-CM | POA: Diagnosis not present

## 2016-02-27 DIAGNOSIS — Z8619 Personal history of other infectious and parasitic diseases: Secondary | ICD-10-CM | POA: Diagnosis not present

## 2016-02-27 DIAGNOSIS — M546 Pain in thoracic spine: Secondary | ICD-10-CM | POA: Diagnosis present

## 2016-02-27 DIAGNOSIS — F17210 Nicotine dependence, cigarettes, uncomplicated: Secondary | ICD-10-CM | POA: Insufficient documentation

## 2016-02-27 MED ORDER — DICLOFENAC SODIUM 50 MG PO TBEC: 50.0000 mg | DELAYED_RELEASE_TABLET | Freq: Two times a day (BID) | ORAL | Status: DC

## 2016-02-27 MED ORDER — CYCLOBENZAPRINE HCL 10 MG PO TABS: 10.0000 mg | ORAL_TABLET | Freq: Two times a day (BID) | ORAL | Status: DC | PRN

## 2016-02-27 MED ORDER — KETOROLAC TROMETHAMINE 60 MG/2ML IM SOLN
30.0000 mg | Freq: Once | INTRAMUSCULAR | Status: AC
  Administered 2016-02-27: 30 mg via INTRAMUSCULAR
  Filled 2016-02-27: qty 2

## 2016-02-27 MED ORDER — CYCLOBENZAPRINE HCL 10 MG PO TABS
10.0000 mg | ORAL_TABLET | Freq: Once | ORAL | Status: AC
Start: 2016-02-27 — End: 2016-02-27
  Administered 2016-02-27: 10 mg via ORAL
  Filled 2016-02-27: qty 1

## 2016-02-27 MED ORDER — HYDROCODONE-ACETAMINOPHEN 5-325 MG PO TABS
1.0000 | ORAL_TABLET | Freq: Once | ORAL | Status: AC
  Administered 2016-02-27: 1 via ORAL
  Filled 2016-02-27: qty 1

## 2016-02-27 NOTE — ED Notes (Signed)
Pt reports upper back pain, worse with movement and turning her neck; onset three days ago. Denies recent injury.

## 2016-02-27 NOTE — ED Provider Notes (Signed)
CSN: [id number]     Arrival date & time 02/27/16  1747 History  By signing my name below, I, Eustaquio Maize, attest that this documentation has been prepared under the direction and in the presence of Debroah Baller, NP. Electronically Signed: Eustaquio Maize, ED Scribe. 02/27/2016. 9:20 PM.   Chief Complaint  Patient presents with  . Back Pain   Patient is a 25 y.o. female presenting with back pain. The history is provided by the patient. No language interpreter was used.  Back Pain Location:  Thoracic spine Quality:  Stiffness Stiffness is present:  All day Radiates to:  Does not radiate Pain severity:  Moderate Pain is:  Same all the time Onset quality:  Sudden Duration:  3 days Timing:  Constant Progression:  Unchanged Chronicity:  New Context: not falling, not lifting heavy objects, not physical stress and not recent injury   Relieved by:  None tried Worsened by:  Movement Ineffective treatments:  None tried Associated symptoms: no abdominal pain, no dysuria and no fever      HPI Comments: Kim Johns is a 25 y.o. female who presents to the Emergency Department complaining of sudden onset, constant, lower neck pain and upper back pain x 3 days. No known injury or trauma to the back. The pain is exacerbated with movement. Pt reports that she began having a headache prior to the back pain starting and still has intermittent headaches. She has been taking Motrin with some relief from the headaches but no relief from the back pain. Pt also mentions mild urinary frequency. Denies dysuria, hematuria, fever, chills, nausea, vomiting, abdominal pain, or any other associated symptoms. LNMP: 2 weeks ago. Pt denies risk of pregnancy due to being on [facility].   Past Medical History  Diagnosis Date  . Allergy   . Asthma     albuterol last used a few months ago  . Preterm labor   . Abscess of breast   . HSV-2 (herpes simplex virus 2) infection    Past Surgical History  Procedure Laterality Date   . Wisdom tooth extraction    . Cervical cerclage N/A 04/05/2015    Procedure: CERCLAGE CERVICAL;  Surgeon: Lavonia Drafts, MD;  Location: [facility];  Service: Gynecology;  Laterality: N/A;   Family History  Problem Relation Age of Onset  . Diabetes Mother   . Stroke Mother   . Hypertension Father    Social History  Substance Use Topics  . Smoking status: Current Every Day Smoker -- 0.50 packs/day for 10 years    Types: Cigarettes  . Smokeless tobacco: Never Used  . Alcohol Use: No   OB History    Gravida Para Term Preterm AB TAB SAB Ectopic Multiple Living   2 2 1 1  0 0 0 0 0 1     Review of Systems  Constitutional: Negative for fever.  Gastrointestinal: Negative for nausea, vomiting and abdominal pain.  Genitourinary: Positive for frequency. Negative for dysuria and hematuria.  Musculoskeletal: Positive for back pain and neck pain.  All other systems reviewed and are negative.     Allergies  Shellfish allergy; Sulfa antibiotics; and Icy hot  Home Medications   Prior to Admission medications   Medication Sig Start Date End Date Taking? Authorizing Provider  albuterol (PROVENTIL HFA;VENTOLIN HFA) 108 (90 BASE) MCG/ACT inhaler Inhale 2 puffs into the lungs every 6 (six) hours as needed for wheezing or shortness of breath. Use spacer 07/01/15   Gwynne Edinger, MD  cyclobenzaprine (FLEXERIL) 10  MG tablet Take 1 tablet (10 mg total) by mouth 2 (two) times daily as needed for muscle spasms. 02/27/16   Kristee Angus Bunnie Pion, NP  diclofenac (VOLTAREN) 50 MG EC tablet Take 1 tablet (50 mg total) by mouth 2 (two) times daily. 02/27/16   Alayah Knouff Bunnie Pion, NP  doxycycline (VIBRAMYCIN) 100 MG capsule Take 1 capsule (100 mg total) by mouth 2 (two) times daily. 02/20/16   Fransico Meadow, PA-C  metroNIDAZOLE (FLAGYL) 500 MG tablet Take 1 tablet (500 mg total) by mouth 2 (two) times daily. 01/29/16   Jorje Guild, NP  norgestimate-ethinyl estradiol (ORTHO-CYCLEN,SPRINTEC,PREVIFEM) 0.25-35 MG-MCG  tablet Take 1 tablet by mouth daily. Only take active pills continuously 10/28/15   Osborne Oman, MD  valACYclovir (VALTREX) 1000 MG tablet Take 1 tablet (1,000 mg total) by mouth 2 (two) times daily. Patient taking differently: Take 1,000 mg by mouth 2 (two) times daily as needed (breakout).  01/02/16   Jorje Guild, NP   BP 120/80 mmHg  Pulse 75  Temp(Src) 99.1 F (37.3 C) (Oral)  Resp 18  SpO2 98%  LMP 02/12/2016   Physical Exam  Constitutional: She is oriented to person, place, and time. She appears well-developed and well-nourished. No distress.  HENT:  Head: Normocephalic and atraumatic.  Right Ear: Tympanic membrane normal.  Left Ear: Tympanic membrane normal.  Nose: Nose normal.  Mouth/Throat: Uvula is midline, oropharynx is clear and moist and mucous membranes are normal.  Eyes: Conjunctivae and EOM are normal. Pupils are equal, round, and reactive to light.  Neck: Normal range of motion. Neck supple. No tracheal deviation present.  Pain with ROM of neck.   Cardiovascular: Normal rate and regular rhythm.   Pulmonary/Chest: Effort normal and breath sounds normal. No respiratory distress. She has no decreased breath sounds. She has no rhonchi.  Abdominal: Soft. Bowel sounds are normal. There is no tenderness. There is no CVA tenderness.  Musculoskeletal: Normal range of motion.       Cervical back: She exhibits spasm. Decreased range of motion: due to pain.       Back:  No pain with palpation over C, T, or L spine. Unable to reproduce the pain that the patient has when she moves her neck.  Radial pulses 2+, adequate circulation, equal grips.  Neurological: She is alert and oriented to person, place, and time. She has normal strength. No cranial nerve deficit or sensory deficit. Gait normal.  Reflex Scores:      Bicep reflexes are 2+ on the right side and 2+ on the left side.      Brachioradialis reflexes are 2+ on the right side and 2+ on the left side.      Patellar  reflexes are 2+ on the right side and 2+ on the left side. Skin: Skin is warm and dry.  Psychiatric: She has a normal mood and affect. Her behavior is normal.  Nursing note and vitals reviewed.   ED Course  Procedures (including critical care time)  DIAGNOSTIC STUDIES: Oxygen Saturation is 98% on RA, normal by my interpretation.    COORDINATION OF CARE: 9:20 PM-Discussed treatment plan which includes DG C Spine with pt at bedside and pt agreed to plan.   10:55 PM - Pt feels much better after Flexeril and Toradol.   Labs Review Labs Reviewed - No data to display  Imaging Review Dg Cervical Spine Complete  02/27/2016  CLINICAL DATA:  25 year old with acute onset of neck pain when rotating her head which  began earlier today. EXAM: CERVICAL SPINE - COMPLETE 4+ VIEW COMPARISON:  Cervical spine CT 02/09/2014. FINDINGS: Slight straightening of the usual cervical lordosis. Anatomic alignment. No visible fractures. Well preserved disc spaces. Normal prevertebral soft tissues. No significant bony foraminal stenoses. Facet joints intact. No static evidence of instability. IMPRESSION: Slight straightening of the usual lordosis which may reflect positioning and/or spasm. Normal examination otherwise. Electronically Signed   By: Evangeline Dakin M.D.   On: 02/27/2016 21:39   **2250 Patient now remembers that she has been carrying her 18 pound baby in his car seat and has noted the pain since she has been carrying him a lot.    MDM  25 y.o. female with upper back/neck pain that started 3 days ago stable for d/c with much improvement after mediation. No focal neuro deficits, no meningeal signs. Discussed with the patient and all questioned fully answered. She will follow up with ortho or return if any problems arise. Rx for flexeril and Voltaren.  Final diagnoses:  Muscle spasm of back    I personally performed the services described in this documentation, which was scribed in my presence. The  recorded information has been reviewed and is accurate.      [address] [place], [place] 02/27/16 2303  Noemi Chapel, MD 02/28/16 [phone]

## 2016-02-27 NOTE — Discharge Instructions (Signed)
Do not drive while taking the muscle relaxant as it will make you sleepy. °

## 2016-03-04 ENCOUNTER — Telehealth: Payer: Self-pay | Admitting: General Practice

## 2016-03-04 DIAGNOSIS — T3695XA Adverse effect of unspecified systemic antibiotic, initial encounter: Principal | ICD-10-CM

## 2016-03-04 DIAGNOSIS — B379 Candidiasis, unspecified: Secondary | ICD-10-CM

## 2016-03-04 MED ORDER — FLUCONAZOLE 150 MG PO TABS: 150.0000 mg | ORAL_TABLET | Freq: Once | ORAL | Status: DC

## 2016-03-04 NOTE — Telephone Encounter (Signed)
Patient states she was recently in the hospital and was treated for MRSA and has been on many antibiotics. Patient states she currently has a yeast infection as a result & would like something sent in to her pharmacy. Patient states if she doesn't answer you can leave detailed information on her voicemail. Called patient, no answer- left message stating we are trying to reach you to return your phone call and we have sent the medication you requested to your pharmacy. If you have any questions you may call us back at the clinics

## 2016-03-11 MED FILL — MONO-LINYAH 28 TABLET: 0.25-35 | 28 days supply | Qty: 28 | Fill #1

## 2016-04-06 ENCOUNTER — Encounter (HOSPITAL_COMMUNITY): Payer: Self-pay | Admitting: Emergency Medicine

## 2016-04-06 ENCOUNTER — Emergency Department (HOSPITAL_COMMUNITY)
Admission: EM | Admit: 2016-04-06 | Discharge: 2016-04-06 | Disposition: A | Discharge status: Home / Self Care | Payer: Medicaid Other | Attending: Emergency Medicine | Admitting: Emergency Medicine

## 2016-04-06 ENCOUNTER — Emergency Department (HOSPITAL_COMMUNITY): Payer: Medicaid Other

## 2016-04-06 DIAGNOSIS — Z793 Long term (current) use of hormonal contraceptives: Secondary | ICD-10-CM | POA: Insufficient documentation

## 2016-04-06 DIAGNOSIS — X509XXA Other and unspecified overexertion or strenuous movements or postures, initial encounter: Secondary | ICD-10-CM | POA: Diagnosis not present

## 2016-04-06 DIAGNOSIS — S99912A Unspecified injury of left ankle, initial encounter: Secondary | ICD-10-CM | POA: Diagnosis present

## 2016-04-06 DIAGNOSIS — Z791 Long term (current) use of non-steroidal anti-inflammatories (NSAID): Secondary | ICD-10-CM | POA: Insufficient documentation

## 2016-04-06 DIAGNOSIS — Y92480 Sidewalk as the place of occurrence of the external cause: Secondary | ICD-10-CM | POA: Insufficient documentation

## 2016-04-06 DIAGNOSIS — Y9301 Activity, walking, marching and hiking: Secondary | ICD-10-CM | POA: Diagnosis not present

## 2016-04-06 DIAGNOSIS — Z792 Long term (current) use of antibiotics: Secondary | ICD-10-CM | POA: Insufficient documentation

## 2016-04-06 DIAGNOSIS — S93402A Sprain of unspecified ligament of left ankle, initial encounter: Secondary | ICD-10-CM | POA: Insufficient documentation

## 2016-04-06 DIAGNOSIS — J45909 Unspecified asthma, uncomplicated: Secondary | ICD-10-CM | POA: Diagnosis not present

## 2016-04-06 DIAGNOSIS — F17210 Nicotine dependence, cigarettes, uncomplicated: Secondary | ICD-10-CM | POA: Diagnosis not present

## 2016-04-06 DIAGNOSIS — Y999 Unspecified external cause status: Secondary | ICD-10-CM | POA: Insufficient documentation

## 2016-04-06 DIAGNOSIS — Z7951 Long term (current) use of inhaled steroids: Secondary | ICD-10-CM | POA: Insufficient documentation

## 2016-04-06 MED ORDER — IBUPROFEN 200 MG PO TABS
600.0000 mg | ORAL_TABLET | Freq: Once | ORAL | Status: AC
  Administered 2016-04-06: 600 mg via ORAL
  Filled 2016-04-06: qty 3

## 2016-04-06 NOTE — ED Provider Notes (Signed)
CSN: [id number]     Arrival date & time 04/06/16  0757 History   First MD Initiated Contact with Patient 04/06/16 [phone]     Chief Complaint  Patient presents with  . Foot Pain    HPI Comments: 25 year old female presents with acute onset of left ankle and foot pain. She states she was walking into her home last night and had an inversion injury when she misstepped on the sidewalk. She had an acute onset of pain which gradually worsened overnight. This morning she states she has not been able to walk. She has not tried anything for pain or used ice.  Patient is a 25 y.o. female presenting with lower extremity pain.  Foot Pain Associated symptoms include arthralgias. Pertinent negatives include no fever or joint swelling.    Past Medical History  Diagnosis Date  . Allergy   . Asthma     albuterol last used a few months ago  . Preterm labor   . Abscess of breast   . HSV-2 (herpes simplex virus 2) infection    Past Surgical History  Procedure Laterality Date  . Wisdom tooth extraction    . Cervical cerclage N/A 04/05/2015    Procedure: CERCLAGE CERVICAL;  Surgeon: Lavonia Drafts, MD;  Location: [facility];  Service: Gynecology;  Laterality: N/A;   Family History  Problem Relation Age of Onset  . Diabetes Mother   . Stroke Mother   . Hypertension Father    Social History  Substance Use Topics  . Smoking status: Current Every Day Smoker -- 0.50 packs/day for 10 years    Types: Cigarettes  . Smokeless tobacco: Never Used  . Alcohol Use: No   OB History    Gravida Para Term Preterm AB TAB SAB Ectopic Multiple Living   2 2 1 1  0 0 0 0 0 1     Review of Systems  Constitutional: Negative for fever.  Musculoskeletal: Positive for arthralgias and gait problem. Negative for back pain and joint swelling.  All other systems reviewed and are negative.     Allergies  Shellfish allergy; Sulfa antibiotics; and Icy hot  Home Medications   Prior to Admission medications    Medication Sig Start Date End Date Taking? Authorizing Provider  albuterol (PROVENTIL HFA;VENTOLIN HFA) 108 (90 BASE) MCG/ACT inhaler Inhale 2 puffs into the lungs every 6 (six) hours as needed for wheezing or shortness of breath. Use spacer 07/01/15  Yes Gwynne Edinger, MD  norgestimate-ethinyl estradiol (ORTHO-CYCLEN,SPRINTEC,PREVIFEM) 0.25-35 MG-MCG tablet Take 1 tablet by mouth daily. Only take active pills continuously 10/28/15  Yes Ugonna A Anyanwu, MD  valACYclovir (VALTREX) 1000 MG tablet Take 1 tablet (1,000 mg total) by mouth 2 (two) times daily. Patient taking differently: Take 1,000 mg by mouth 2 (two) times daily as needed (breakout).  01/02/16  Yes Jorje Guild, NP  cyclobenzaprine (FLEXERIL) 10 MG tablet Take 1 tablet (10 mg total) by mouth 2 (two) times daily as needed for muscle spasms. Patient not taking: Reported on 04/06/2016 02/27/16   Ashley Murrain, NP  diclofenac (VOLTAREN) 50 MG EC tablet Take 1 tablet (50 mg total) by mouth 2 (two) times daily. Patient not taking: Reported on 04/06/2016 02/27/16   Ashley Murrain, NP  doxycycline (VIBRAMYCIN) 100 MG capsule Take 1 capsule (100 mg total) by mouth 2 (two) times daily. Patient not taking: Reported on 04/06/2016 02/20/16   Fransico Meadow, PA-C  fluconazole (DIFLUCAN) 150 MG tablet Take 1 tablet (150 mg total) by  mouth once. Patient not taking: Reported on 04/06/2016 03/04/16   Tanna Savoy Stinson, DO  metroNIDAZOLE (FLAGYL) 500 MG tablet Take 1 tablet (500 mg total) by mouth 2 (two) times daily. Patient not taking: Reported on 04/06/2016 01/29/16   Jorje Guild, NP   BP 138/85 mmHg  Pulse 82  Temp(Src) 98.7 F (37.1 C) (Oral)  Resp 18  SpO2 100%  LMP 03/16/2016 Physical Exam  Constitutional: She is oriented to person, place, and time. She appears well-developed and well-nourished. No distress.  HENT:  Head: Normocephalic and atraumatic.  Eyes: Conjunctivae are normal. Pupils are equal, round, and reactive to light. Right eye exhibits no  discharge. Left eye exhibits no discharge. No scleral icterus.  Neck: Normal range of motion.  Pulmonary/Chest: Effort normal.  Musculoskeletal:  L foot: No obvious swelling or deformity. No ecchymosis. Tenderness to palpation of the lateral aspect of the foot and dorsal aspect of foot. No tenderness to medial aspect of foot, toes, or heel. No tenderness of calf. Decreased ROM of ankle and toes. N/V intact.  Neurological: She is alert and oriented to person, place, and time.  Skin: Skin is warm and dry.  Psychiatric: She has a normal mood and affect.    ED Course  Procedures (including critical care time) Labs Review Labs Reviewed - No data to display  Imaging Review Dg Foot Complete Left  04/06/2016  CLINICAL DATA:  Twisting injury left foot last night with pain dorsally. EXAM: LEFT FOOT - COMPLETE 3+ VIEW COMPARISON:  None. FINDINGS: There is no evidence of fracture or dislocation. There is no evidence of arthropathy or other focal bone abnormality. Soft tissues are unremarkable. IMPRESSION: Negative. Electronically Signed   By: Marin Olp M.D.   On: 04/06/2016 09:25   I have personally reviewed and evaluated these images and lab results as part of my medical decision-making.   EKG Interpretation None      MDM   Final diagnoses:  Ankle sprain, left, initial encounter   25 year old female presents with acute onset of left foot and ankle pain after inversion injury last night. X-ray negative for fracture. Recommended RICE therapy and crutches until she is able to bear weight. ASO brace applied. Recommend NSAIDs for pain. Patient is NAD, non-toxic, with stable VS. Patient is informed of clinical course, understands medical decision making process, and agrees with plan. Opportunity for questions provided and all questions answered.    Recardo Evangelist, PA-C 04/06/16 [place], MD 04/06/16 [phone]

## 2016-04-06 NOTE — Discharge Instructions (Signed)
Ankle Sprain  An ankle sprain is an injury to the strong, fibrous tissues (ligaments) that hold the bones of your ankle joint together.   CAUSES  An ankle sprain is usually caused by a fall or by twisting your ankle. Ankle sprains most commonly occur when you step on the outer edge of your foot, and your ankle turns inward. People who participate in sports are more prone to these types of injuries.   SYMPTOMS    Pain in your ankle. The pain may be present at rest or only when you are trying to stand or walk.   Swelling.   Bruising. Bruising may develop immediately or within 1 to 2 days after your injury.   Difficulty standing or walking, particularly when turning corners or changing directions.  DIAGNOSIS   Your caregiver will ask you details about your injury and perform a physical exam of your ankle to determine if you have an ankle sprain. During the physical exam, your caregiver will press on and apply pressure to specific areas of your foot and ankle. Your caregiver will try to move your ankle in certain ways. An X-ray exam may be done to be sure a bone was not broken or a ligament did not separate from one of the bones in your ankle (avulsion fracture).   TREATMENT   Certain types of braces can help stabilize your ankle. Your caregiver can make a recommendation for this. Your caregiver may recommend the use of medicine for pain. If your sprain is severe, your caregiver may refer you to a surgeon who helps to restore function to parts of your skeletal system (orthopedist) or a physical therapist.  HOME CARE INSTRUCTIONS    Apply ice to your injury for 1-2 days or as directed by your caregiver. Applying ice helps to reduce inflammation and pain.    Put ice in a plastic bag.    Place a towel between your skin and the bag.    Leave the ice on for 15-20 minutes at a time, every 2 hours while you are awake.   Only take over-the-counter or prescription medicines for pain, discomfort, or fever as directed by  your caregiver.   Elevate your injured ankle above the level of your heart as much as possible for 2-3 days.   If your caregiver recommends crutches, use them as instructed. Gradually put weight on the affected ankle. Continue to use crutches or a cane until you can walk without feeling pain in your ankle.   If you have a plaster splint, wear the splint as directed by your caregiver. Do not rest it on anything harder than a pillow for the first 24 hours. Do not put weight on it. Do not get it wet. You may take it off to take a shower or bath.   You may have been given an elastic bandage to wear around your ankle to provide support. If the elastic bandage is too tight (you have numbness or tingling in your foot or your foot becomes cold and blue), adjust the bandage to make it comfortable.   If you have an air splint, you may blow more air into it or let air out to make it more comfortable. You may take your splint off at night and before taking a shower or bath. Wiggle your toes in the splint several times per day to decrease swelling.  SEEK MEDICAL CARE IF:    You have rapidly increasing bruising or swelling.   Your toes feel   extremely cold or you lose feeling in your foot.   Your pain is not relieved with medicine.  SEEK IMMEDIATE MEDICAL CARE IF:   Your toes are numb or blue.   You have severe pain that is increasing.  MAKE SURE YOU:    Understand these instructions.   Will watch your condition.   Will get help right away if you are not doing well or get worse.     This information is not intended to replace advice given to you by your health care provider. Make sure you discuss any questions you have with your health care provider.     Document Released: 11/11/2005 Document Revised: 12/02/2014 Document Reviewed: 11/23/2011  Elsevier Interactive Patient Education 2016 [employer]

## 2016-04-06 NOTE — ED Notes (Signed)
Pt c/o left foot pain after twisting it last night. Pt states it started hurting in the middle of the night and she cannot put any weight on it.

## 2016-04-11 MED FILL — MONO-LINYAH 28 TABLET: 0.25-35 | 28 days supply | Qty: 28 | Fill #2

## 2016-05-08 MED FILL — MONO-LINYAH 28 TABLET: 0.25-35 | 84 days supply | Qty: 84 | Fill #3

## 2016-05-30 ENCOUNTER — Telehealth: Payer: Self-pay | Admitting: General Practice

## 2016-05-30 NOTE — Telephone Encounter (Signed)
Patient called and left message stating she is having cramping over the past severe days that is at times excruciating. Patient states she was treated for PID last year due to the IUD and this feels kind of similar. Called patient, no answer- unable to leave message due to voicemail box full.

## 2016-06-04 NOTE — Telephone Encounter (Signed)
Called pt regarding her message from last week and was unable to leave a message due to the voice mailbox being full. Message was sent to pt via [employer] [place].

## 2016-06-14 ENCOUNTER — Inpatient Hospital Stay (HOSPITAL_COMMUNITY)
Admission: AD | Admit: 2016-06-14 | Discharge: 2016-06-14 | Disposition: A | Discharge status: Home / Self Care | Payer: Medicaid Other | Source: Ambulatory Visit | Attending: Obstetrics and Gynecology | Admitting: Obstetrics and Gynecology

## 2016-06-14 ENCOUNTER — Encounter (HOSPITAL_COMMUNITY): Payer: Self-pay

## 2016-06-14 DIAGNOSIS — B373 Candidiasis of vulva and vagina: Secondary | ICD-10-CM | POA: Insufficient documentation

## 2016-06-14 DIAGNOSIS — L732 Hidradenitis suppurativa: Secondary | ICD-10-CM

## 2016-06-14 DIAGNOSIS — J45909 Unspecified asthma, uncomplicated: Secondary | ICD-10-CM | POA: Insufficient documentation

## 2016-06-14 DIAGNOSIS — N760 Acute vaginitis: Secondary | ICD-10-CM | POA: Diagnosis present

## 2016-06-14 DIAGNOSIS — B009 Herpesviral infection, unspecified: Secondary | ICD-10-CM | POA: Diagnosis not present

## 2016-06-14 DIAGNOSIS — F17210 Nicotine dependence, cigarettes, uncomplicated: Secondary | ICD-10-CM | POA: Insufficient documentation

## 2016-06-14 DIAGNOSIS — B3731 Acute candidiasis of vulva and vagina: Secondary | ICD-10-CM

## 2016-06-14 LAB — URINALYSIS, ROUTINE W REFLEX MICROSCOPIC
BILIRUBIN URINE: NEGATIVE
Glucose, UA: NEGATIVE mg/dL
KETONES UR: NEGATIVE mg/dL
Leukocytes, UA: NEGATIVE
NITRITE: NEGATIVE
Protein, ur: NEGATIVE mg/dL
SPECIFIC GRAVITY, URINE: 1.025 (ref 1.005–1.030)
pH: 6 (ref 5.0–8.0)

## 2016-06-14 LAB — WET PREP, GENITAL
CLUE CELLS WET PREP: NONE SEEN
Sperm: NONE SEEN
TRICH WET PREP: NONE SEEN
Yeast Wet Prep HPF POC: NONE SEEN

## 2016-06-14 LAB — URINE MICROSCOPIC-ADD ON: BACTERIA UA: NONE SEEN

## 2016-06-14 LAB — POCT PREGNANCY, URINE: PREG TEST UR: NEGATIVE

## 2016-06-14 MED ORDER — DOXYCYCLINE HYCLATE 100 MG PO CAPS: 100.0000 mg | ORAL_CAPSULE | Freq: Two times a day (BID) | ORAL | Status: DC

## 2016-06-14 MED ORDER — FLUCONAZOLE 150 MG PO TABS
150.0000 mg | ORAL_TABLET | Freq: Every day | ORAL | Status: DC
Start: 2016-06-14 — End: 2017-03-25

## 2016-06-14 MED ORDER — FLUCONAZOLE 150 MG PO TABS: 150.0000 mg | ORAL_TABLET | Freq: Every day | ORAL | Status: DC

## 2016-06-14 NOTE — MAU Provider Note (Signed)
History     CSN: [id number]  Arrival date and time: 06/14/16 1726   First Provider Initiated Contact with Patient 06/14/16 1853      Chief Complaint  Patient presents with  . Vaginitis   HPI Kim Johns is a 25 y.o. female who presents for vaginal irritation & abscesses. Reports vaginal itching & irritation x 1 week. Has tried OTC monistat without relief. Has noticed some clumpy white/green vaginal discharge. Denies vaginal bleeding, dyspareunia, postcoital bleeding, abdominal pain, dysuria, or fever. Does want STD testing today.  Also reports multiple painful lumps in sporadic locations; right axilla, left labia, left buttocks. States was treated with abx several months ago for same issue and they have returned. Does have PCP but hasn't followed up with them b/c her particular MD moved. Denies drainage from lumps. Has tried warm baths for pain & to promote drainage without relief. These abscesses have been recurring for the last few years.   OB History    Gravida Para Term Preterm AB TAB SAB Ectopic Multiple Living   2 2 1 1  0 0 0 0 0 1      Past Medical History  Diagnosis Date  . Allergy   . Asthma     albuterol last used a few months ago  . Preterm labor   . Abscess of breast   . HSV-2 (herpes simplex virus 2) infection     Past Surgical History  Procedure Laterality Date  . Wisdom tooth extraction    . Cervical cerclage N/A 04/05/2015    Procedure: CERCLAGE CERVICAL;  Surgeon: Lavonia Drafts, MD;  Location: [facility];  Service: Gynecology;  Laterality: N/A;    Family History  Problem Relation Age of Onset  . Diabetes Mother   . Stroke Mother   . Hypertension Father     Social History  Substance Use Topics  . Smoking status: Current Every Day Smoker -- 0.50 packs/day for 10 years    Types: Cigarettes  . Smokeless tobacco: Never Used  . Alcohol Use: No    Allergies:  Allergies  Allergen Reactions  . Shellfish Allergy Hives  . Sulfa Antibiotics Other  (See Comments)    Reaction:  Unknown; childhood reaction  . Icy Hot Rash    Prescriptions prior to admission  Medication Sig Dispense Refill Last Dose  . albuterol (PROVENTIL HFA;VENTOLIN HFA) 108 (90 BASE) MCG/ACT inhaler Inhale 2 puffs into the lungs every 6 (six) hours as needed for wheezing or shortness of breath. Use spacer 1 Inhaler 2 unknown  . cyclobenzaprine (FLEXERIL) 10 MG tablet Take 1 tablet (10 mg total) by mouth 2 (two) times daily as needed for muscle spasms. (Patient not taking: Reported on 04/06/2016) 20 tablet 0 Completed Course at Unknown time  . diclofenac (VOLTAREN) 50 MG EC tablet Take 1 tablet (50 mg total) by mouth 2 (two) times daily. (Patient not taking: Reported on 04/06/2016) 15 tablet 0 Completed Course at Unknown time  . doxycycline (VIBRAMYCIN) 100 MG capsule Take 1 capsule (100 mg total) by mouth 2 (two) times daily. (Patient not taking: Reported on 04/06/2016) 20 capsule 0 Completed Course at Unknown time  . fluconazole (DIFLUCAN) 150 MG tablet Take 1 tablet (150 mg total) by mouth once. (Patient not taking: Reported on 04/06/2016) 1 tablet 0 Completed Course at Unknown time  . metroNIDAZOLE (FLAGYL) 500 MG tablet Take 1 tablet (500 mg total) by mouth 2 (two) times daily. (Patient not taking: Reported on 04/06/2016) 14 tablet 0 Completed Course at Unknown  time  . norgestimate-ethinyl estradiol (ORTHO-CYCLEN,SPRINTEC,PREVIFEM) 0.25-35 MG-MCG tablet Take 1 tablet by mouth daily. Only take active pills continuously 3 Package 5 04/05/2016 at Unknown time  . valACYclovir (VALTREX) 1000 MG tablet Take 1 tablet (1,000 mg total) by mouth 2 (two) times daily. (Patient taking differently: Take 1,000 mg by mouth 2 (two) times daily as needed (breakout). ) 20 tablet 1 unknown    Review of Systems  Constitutional: Negative for fever and chills.  Gastrointestinal: Negative.   Genitourinary: Negative for dysuria.       + vaginal discharge & irritation No vaginal bleeding,  postcoital bleeding, dyspareunia  Skin:       + painful lumps   Physical Exam   Blood pressure 132/80, pulse 82, temperature 98.8 F (37.1 C), temperature source Oral, resp. rate 18, height 5\' 2"  (1.575 m), weight 203 lb 1.9 oz (92.135 kg), last menstrual period 06/03/2016, not currently breastfeeding.  Physical Exam  Nursing note and vitals reviewed. Constitutional: She is oriented to person, place, and time. She appears well-developed and well-nourished. No distress.  HENT:  Head: Normocephalic and atraumatic.  Eyes: Conjunctivae are normal. Right eye exhibits no discharge. Left eye exhibits no discharge. No scleral icterus.  Neck: Normal range of motion.  Cardiovascular: Normal rate.   Respiratory: Effort normal. No respiratory distress.  GI: Soft. There is no tenderness.  Genitourinary: Uterus normal.    Cervix exhibits no motion tenderness and no friability. Right adnexum displays no mass, no tenderness and no fullness. Left adnexum displays no mass, no tenderness and no fullness. There is erythema in the vagina. Vaginal discharge (moderate amount of thick & clumpy white discharge, adherant to vaginal walls) found.  Neurological: She is alert and oriented to person, place, and time.  Skin: Skin is warm and dry. She is not diaphoretic.  Psychiatric: She has a normal mood and affect. Her behavior is normal. Judgment and thought content normal.    MAU Course  Procedures Results for orders placed or performed during the hospital encounter of 06/14/16 (from the past 48 hour(s))  Urinalysis, Routine w reflex microscopic (not at [facility])     Status: Abnormal   Collection Time: 06/14/16  5:40 PM  Result Value Ref Range   Color, Urine YELLOW YELLOW   APPearance CLEAR CLEAR   Specific Gravity, Urine 1.025 1.005 - 1.030   pH 6.0 5.0 - 8.0   Glucose, UA NEGATIVE NEGATIVE mg/dL   Hgb urine dipstick TRACE (A) NEGATIVE   Bilirubin Urine NEGATIVE NEGATIVE   Ketones, ur NEGATIVE NEGATIVE  mg/dL   Protein, ur NEGATIVE NEGATIVE mg/dL   Nitrite NEGATIVE NEGATIVE   Leukocytes, UA NEGATIVE NEGATIVE  Urine microscopic-add on     Status: Abnormal   Collection Time: 06/14/16  5:40 PM  Result Value Ref Range   Squamous Epithelial / LPF 0-5 (A) NONE SEEN   WBC, UA 0-5 0 - 5 WBC/hpf   RBC / HPF 0-5 0 - 5 RBC/hpf   Bacteria, UA NONE SEEN NONE SEEN   Urine-Other MUCOUS PRESENT   Pregnancy, urine POC     Status: None   Collection Time: 06/14/16  5:42 PM  Result Value Ref Range   Preg Test, Ur NEGATIVE NEGATIVE    Comment:        THE SENSITIVITY OF THIS METHODOLOGY IS >24 mIU/mL   RPR     Status: None   Collection Time: 06/14/16  7:23 PM  Result Value Ref Range   RPR Ser Ql Non Reactive  Non Reactive    Comment: (NOTE) Performed At: [facility] [place], [place] [id number] Lindon Romp MD [id number]   HIV antibody     Status: None   Collection Time: 06/14/16  7:23 PM  Result Value Ref Range   HIV Screen 4th Generation wRfx Non Reactive Non Reactive    Comment: (NOTE) Performed At: [facility] [address] [place], [place] [id number] Lindon Romp MD [id number]   Wet prep, genital     Status: Abnormal   Collection Time: 06/14/16  7:35 PM  Result Value Ref Range   Yeast Wet Prep HPF POC NONE SEEN NONE SEEN   Trich, Wet Prep NONE SEEN NONE SEEN   Clue Cells Wet Prep HPF POC NONE SEEN NONE SEEN   WBC, Wet Prep HPF POC MANY (A) NONE SEEN    Comment: MANY BACTERIA SEEN   Sperm NONE SEEN     MDM UPT negative Wet prep negative - will treat for yeast based on clinical judgment  Assessment and Plan  Vaginal yeast infection - Rx diflucan w/1 refill to use if no improvement in 72 hrs -GC/CT pending  Hidradenitis suppurativa -rx doxycycline x 2 weeks -continue warm baths & compresses -F/u with PCP for further management -if symptoms worsen or fever go to PCP/urgent care/ED  Jorje Guild 06/14/2016, 6:52 PM

## 2016-06-14 NOTE — Discharge Instructions (Signed)
Hidradenitis Suppurativa Hidradenitis suppurativa is a long-term (chronic) skin disease that starts with blocked sweat glands or hair follicles. Bacteria may grow in these blocked openings of your skin. Hidradenitis suppurativa is like a severe form of acne that develops in areas of your body where acne would be unusual. It is most likely to affect the areas of your body where skin rubs against skin and becomes moist. This includes your:  Underarms.  Groin.  Genital areas.  Buttocks.  Upper thighs.  Breasts. Hidradenitis suppurativa may start out with small pimples. The pimples can develop into deep sores that break open (rupture) and drain pus. Over time your skin may thicken and become scarred. Hidradenitis suppurativa cannot be passed from person to person.  CAUSES  The exact cause of hidradenitis suppurativa is not known. This condition may be due to:  Female and female hormones. The condition is rare before and after puberty.  An overactive body defense system (immune system). Your immune system may overreact to the blocked hair follicles or sweat glands and cause swelling and pus-filled sores. RISK FACTORS You may have a higher risk of hidradenitis suppurativa if you:  Are a woman.  Are between ages 33 and 61.  Have a family history of hidradenitis suppurativa.  Have a personal history of acne.  Are overweight.  Smoke.  Take the drug lithium. SIGNS AND SYMPTOMS  The first signs of an outbreak are usually painful skin bumps that look like pimples. As the condition progresses:  Skin bumps may get bigger and grow deeper into the skin.  Bumps under the skin may rupture and drain smelly pus.  Skin may become itchy and infected.  Skin may thicken and scar.  Drainage may continue through tunnels under the skin (fistulas).  Walking and moving your arms can become painful. DIAGNOSIS  Your health care provider may diagnose hidradenitis suppurativa based on your medical  history and your signs and symptoms. A physical exam will also be done. You may need to see a health care provider who specializes in skin diseases (dermatologist). You may also have tests done to confirm the diagnosis. These can include:  Swabbing a sample of pus or drainage from your skin so it can be sent to the lab and tested for infection.  Blood tests to check for infection. TREATMENT  The same treatment will not work for everybody with hidradenitis suppurativa. Your treatment will depend on how severe your symptoms are. You may need to try several treatments to find what works best for you. Part of your treatment may include cleaning and bandaging (dressing) your wounds. You may also have to take medicines, such as the following:  Antibiotics.  Acne medicines.  Medicines to block or suppress the immune system.  A diabetes medicine (metformin) is sometimes used to treat this condition.  For women, birth control pills can sometimes help relieve symptoms. You may need surgery if you have a severe case of hidradenitis suppurativa that does not respond to medicine. Surgery may involve:   Using a laser to clear the skin and remove hair follicles.  Opening and draining deep sores.  Removing the areas of skin that are diseased and scarred. HOME CARE INSTRUCTIONS  Learn as much as you can about your disease, and work closely with your health care providers.  Take medicines only as directed by your health care provider.  If you were prescribed an antibiotic medicine, finish it all even if you start to feel better.  If you are  overweight, losing weight may be very helpful. Try to reach and maintain a healthy weight.  Do not use any tobacco products, including cigarettes, chewing tobacco, or electronic cigarettes. If you need help quitting, ask your health care provider.  Do not shave the areas where you get hidradenitis suppurativa.  Do not wear deodorant.  Wear loose-fitting  clothes.  Try not to overheat and get sweaty.  Take a daily bleach bath as directed by your health care provider.  Fill your bathtub halfway with water.  Pour in  cup of unscented household bleach.  Soak for 5-10 minutes.  Cover sore areas with a warm, clean washcloth (compress) for 5-10 minutes. SEEK MEDICAL CARE IF:   You have a flare-up of hidradenitis suppurativa.  You have chills or a fever.  You are having trouble controlling your symptoms at home.   This information is not intended to replace advice given to you by your health care provider. Make sure you discuss any questions you have with your health care provider.   Document Released: 06/25/2004 Document Revised: 12/02/2014 Document Reviewed: 02/11/2014 Elsevier Interactive Patient Education 2016 Elsevier [employer]  Monilial Vaginitis Vaginitis in a soreness, swelling and redness (inflammation) of the vagina and vulva. Monilial vaginitis is not a sexually transmitted infection. CAUSES  Yeast vaginitis is caused by yeast (candida) that is normally found in your vagina. With a yeast infection, the candida has overgrown in number to a point that upsets the chemical balance. SYMPTOMS   White, thick vaginal discharge.  Swelling, itching, redness and irritation of the vagina and possibly the lips of the vagina (vulva).  Burning or painful urination.  Painful intercourse. DIAGNOSIS  Things that may contribute to monilial vaginitis are:  Postmenopausal and virginal states.  Pregnancy.  Infections.  Being tired, sick or stressed, especially if you had monilial vaginitis in the past.  Diabetes. Good control will help lower the chance.  Birth control pills.  Tight fitting garments.  Using bubble bath, feminine sprays, douches or deodorant tampons.  Taking certain medications that kill germs (antibiotics).  Sporadic recurrence can occur if you become ill. TREATMENT  Your caregiver will give you  medication.  There are several kinds of anti monilial vaginal creams and suppositories specific for monilial vaginitis. For recurrent yeast infections, use a suppository or cream in the vagina 2 times a week, or as directed.  Anti-monilial or steroid cream for the itching or irritation of the vulva may also be used. Get your caregiver's permission.  Painting the vagina with methylene blue solution may help if the monilial cream does not work.  Eating yogurt may help prevent monilial vaginitis. HOME CARE INSTRUCTIONS   Finish all medication as prescribed.  Do not have sex until treatment is completed or after your caregiver tells you it is okay.  Take warm sitz baths.  Do not douche.  Do not use tampons, especially scented ones.  Wear cotton underwear.  Avoid tight pants and panty hose.  Tell your sexual partner that you have a yeast infection. They should go to their caregiver if they have symptoms such as mild rash or itching.  Your sexual partner should be treated as well if your infection is difficult to eliminate.  Practice safer sex. Use condoms.  Some vaginal medications cause latex condoms to fail. Vaginal medications that harm condoms are:  Cleocin cream.  Butoconazole (Femstat).  Terconazole (Terazol) vaginal suppository.  Miconazole (Monistat) (may be purchased over the counter). SEEK MEDICAL CARE IF:   You have  a temperature by mouth above 102 F (38.9 C).  The infection is getting worse after 2 days of treatment.  The infection is not getting better after 3 days of treatment.  You develop blisters in or around your vagina.  You develop vaginal bleeding, and it is not your menstrual period.  You have pain when you urinate.  You develop intestinal problems.  You have pain with sexual intercourse.   This information is not intended to replace advice given to you by your health care provider. Make sure you discuss any questions you have with your  health care provider.   Document Released: 08/21/2005 Document Revised: 02/03/2012 Document Reviewed: 05/15/2015 Elsevier Interactive Patient Education [employer].

## 2016-06-14 NOTE — Progress Notes (Signed)
Erin Lawrence NP in earlier to discuss test results and d/c plan. Written and verbal d/c instructions given and understanding voiced 

## 2016-06-14 NOTE — MAU Note (Signed)
Patient presents with c/o yeast infection, tried Monistat, vaginal irritation, multiple abscesses.

## 2016-06-15 LAB — HIV ANTIBODY (ROUTINE TESTING W REFLEX): HIV SCREEN 4TH GENERATION: NONREACTIVE

## 2016-06-15 LAB — RPR: RPR Ser Ql: NONREACTIVE

## 2016-06-17 LAB — GC/CHLAMYDIA PROBE AMP (~~LOC~~) NOT AT ARMC
CHLAMYDIA, DNA PROBE: NEGATIVE
Neisseria Gonorrhea: NEGATIVE

## 2016-06-17 MED FILL — FLUCONAZOLE 150 MG TABLET: 150 | 30 days supply | Qty: 1 | Fill #0

## 2016-06-28 ENCOUNTER — Ambulatory Visit: Payer: Medicaid Other | Admitting: Internal Medicine

## 2016-07-22 ENCOUNTER — Telehealth: Payer: Self-pay | Admitting: *Deleted

## 2016-07-22 NOTE — Telephone Encounter (Addendum)
Pt left message stating that she has been bleeding in the middle of her cycle and her period is not due for a week. She further states that she has not missed any pills or had any delays in taking them on schedule. She has been on OCP's x10 months. She wants to know if she needs to be seen for evaluation.   8/31  1350  Called pt and discussed her concerns. She states this is the first time she has had the midcycle bleeding. I advised pt that she can schedule appt to be seen and it will likely be several weeks to a month. She should continue to take the OCP's on schedule and keep a log of her bleeding to report at the time of her visit. Pt agreed. She then stated that she is having sx of UTI. I asked her to come in today to submit urine specimen for testing and she agreed to be here no later that 4pm.  Pt voiced understanding of all information and instructions given.

## 2016-07-25 ENCOUNTER — Ambulatory Visit: Payer: Medicaid Other

## 2016-07-25 DIAGNOSIS — R300 Dysuria: Secondary | ICD-10-CM

## 2016-07-25 DIAGNOSIS — N390 Urinary tract infection, site not specified: Secondary | ICD-10-CM

## 2016-07-25 LAB — POCT URINALYSIS DIP (DEVICE)
Glucose, UA: NEGATIVE mg/dL
Ketones, ur: NEGATIVE mg/dL
LEUKOCYTES UA: NEGATIVE
Nitrite: NEGATIVE
Protein, ur: 30 mg/dL — AB
SPECIFIC GRAVITY, URINE: 1.025 (ref 1.005–1.030)
Urobilinogen, UA: 0.2 mg/dL (ref 0.0–1.0)
pH: 6 (ref 5.0–8.0)

## 2016-07-25 NOTE — Progress Notes (Signed)
Patient presented to office today for a urine check. Patient stated she has been having some dysuria for a couple of days. Urine had some blood everything else look ok. I advised patient I would send off for a urine culture.Patient will be contacted with results once they have been reviewed by provider.

## 2016-07-27 LAB — URINE CULTURE: ORGANISM ID, BACTERIA: NO GROWTH

## 2016-08-05 ENCOUNTER — Other Ambulatory Visit: Payer: Self-pay | Admitting: Obstetrics & Gynecology

## 2016-08-05 ENCOUNTER — Other Ambulatory Visit: Payer: Self-pay | Admitting: *Deleted

## 2016-08-05 NOTE — Telephone Encounter (Signed)
Per chart review Dr. Harolyn Rutherford refilled request from pharmacy for birth control pills. I called Kim Johns and informed her of this  And she states pharmacy had just called her also. She also asked about results of her urine culture- which was negative . She also asked about the appointment for her irregular bleeding she had discussed with a nurse  A few days ago- states did not make the appointment while she was here giving urine. I informed her I would have registar call her with appt for asap- hopefully within next 2 weeks. She voices understanding.

## 2016-08-05 NOTE — Telephone Encounter (Signed)
Kim Johns called this and left a message stating she needs to get a refill of her birth control pill. States her pharmacy was closed Friday and we were closed Friday afternoon and [facility] is still closed today. Wants to know if she can get a refill sent today to [facility] because she was supposed to start a new pack yesterday.

## 2016-08-28 ENCOUNTER — Ambulatory Visit: Payer: Medicaid Other | Admitting: Obstetrics and Gynecology

## 2016-08-28 ENCOUNTER — Encounter: Payer: Self-pay | Admitting: *Deleted

## 2016-08-28 NOTE — Progress Notes (Signed)
Kim Johns missed a scheduled appointment for irregular bleeding. Sent letter to notify patient and ask her to reschedule.

## 2016-10-13 ENCOUNTER — Encounter (HOSPITAL_COMMUNITY): Payer: Self-pay

## 2016-10-13 ENCOUNTER — Emergency Department (HOSPITAL_COMMUNITY)
Admission: EM | Admit: 2016-10-13 | Discharge: 2016-10-13 | Disposition: A | Discharge status: Home / Self Care | Payer: Medicaid Other | Attending: Emergency Medicine | Admitting: Emergency Medicine

## 2016-10-13 DIAGNOSIS — Z79899 Other long term (current) drug therapy: Secondary | ICD-10-CM | POA: Diagnosis not present

## 2016-10-13 DIAGNOSIS — R509 Fever, unspecified: Secondary | ICD-10-CM | POA: Insufficient documentation

## 2016-10-13 DIAGNOSIS — R69 Illness, unspecified: Secondary | ICD-10-CM

## 2016-10-13 DIAGNOSIS — R51 Headache: Secondary | ICD-10-CM | POA: Diagnosis not present

## 2016-10-13 DIAGNOSIS — R5383 Other fatigue: Secondary | ICD-10-CM | POA: Insufficient documentation

## 2016-10-13 DIAGNOSIS — J111 Influenza due to unidentified influenza virus with other respiratory manifestations: Secondary | ICD-10-CM

## 2016-10-13 DIAGNOSIS — J45909 Unspecified asthma, uncomplicated: Secondary | ICD-10-CM | POA: Insufficient documentation

## 2016-10-13 DIAGNOSIS — Z87891 Personal history of nicotine dependence: Secondary | ICD-10-CM | POA: Insufficient documentation

## 2016-10-13 MED ORDER — HYDROCODONE-ACETAMINOPHEN 5-325 MG PO TABS: ORAL_TABLET | ORAL | 0 refills | Status: DC

## 2016-10-13 MED ORDER — OSELTAMIVIR PHOSPHATE 75 MG PO CAPS: 75.0000 mg | ORAL_CAPSULE | Freq: Two times a day (BID) | ORAL | 0 refills | Status: DC

## 2016-10-13 NOTE — ED Triage Notes (Signed)
Patient c/o elevated temperature and sore throat, and body aches since yesterday.

## 2016-10-13 NOTE — ED Provider Notes (Signed)
[facility] DEPT Provider Note   CSN: [id number] Arrival date & time: 10/13/16  1651  By signing my name below, I, Gwenlyn Fudge, attest that this documentation has been prepared under the direction and in the presence of [employer], [employer]. Electronically Signed: Gwenlyn Fudge, ED Scribe. 10/13/16. 5:15 PM.  History   Chief Complaint No chief complaint on file.  The history is provided by the patient. No language interpreter was used.   HPI Comments: Kaydence Puca is a 25 y.o. female with PMHx of asthma who presents to the Emergency Department complaining of gradual onset, constant, moderate generalized body aches onset yesterday. Associated symptoms include fever, sore throat, ear pain, decreased appetite, headache and fatigue. Pt has not received influenza vaccination. Pt's son is sick, but does not have similar symptoms. She only takes birth control medication regularly. Pt does not smoke. She denies cough, chest pain, abdominal pain, rhinorrhea.  Past Medical History:  Diagnosis Date  . Abscess of breast   . Allergy   . Asthma    albuterol last used a few months ago  . HSV-2 (herpes simplex virus 2) infection   . Preterm labor     Patient Active Problem List   Diagnosis Date Noted  . 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 Infection associated with intrauterine device (IUD) ([place]) 10/25/2015  . TOA (tubo-ovarian abscess) 10/25/2015  . PID (acute pelvic inflammatory disease) 10/24/2015    Past Surgical History:  Procedure Laterality Date  . CERVICAL CERCLAGE N/A 04/05/2015   Procedure: CERCLAGE CERVICAL;  Surgeon: Lavonia Drafts, MD;  Location: [facility];  Service: Gynecology;  Laterality: N/A;  . WISDOM TOOTH EXTRACTION      OB History    Gravida Para Term Preterm AB Living   2 2 1 1  0 1   SAB TAB Ectopic Multiple Live Births   0 0 0 0 1       Home Medications    Prior to Admission medications   Medication Sig Start Date End Date  Taking? Authorizing Provider  albuterol (PROVENTIL HFA;VENTOLIN HFA) 108 (90 BASE) MCG/ACT inhaler Inhale 2 puffs into the lungs every 6 (six) hours as needed for wheezing or shortness of breath. Use spacer 07/01/15   Gwynne Edinger, MD  doxycycline (VIBRAMYCIN) 100 MG capsule Take 1 capsule (100 mg total) by mouth 2 (two) times daily. 06/14/16   Jorje Guild, NP  fluconazole (DIFLUCAN) 150 MG tablet Take 1 tablet (150 mg total) by mouth daily. 06/14/16   Jorje Guild, NP  HYDROcodone-acetaminophen (NORCO/VICODIN) 5-325 MG tablet Take 1-2 tablets by mouth every 6 hours as needed for pain and/or cough. 10/13/16   Karle Desrosier, PA-C  oseltamivir (TAMIFLU) 75 MG capsule Take 1 capsule (75 mg total) by mouth every 12 (twelve) hours. 10/13/16   Masashi Snowdon, PA-C  SPRINTEC 28 0.25-35 MG-MCG tablet TAKE 1 TABLET BY MOUTH EVERY DAY (ONLY TAKE ACTIVE PILLS CONTINUOUSLY) 08/05/16   Osborne Oman, MD  valACYclovir (VALTREX) 1000 MG tablet Take 1 tablet (1,000 mg total) by mouth 2 (two) times daily. Patient taking differently: Take 1,000 mg by mouth 2 (two) times daily as needed (outbreaks).  01/02/16   Jorje Guild, NP    Family History Family History  Problem Relation Age of Onset  . Diabetes Mother   . Stroke Mother   . Hypertension Father     Social History Social History  Substance Use Topics  . Smoking status: Former Smoker    Packs/day: 0.50  Years: 10.00    Types: Cigarettes  . Smokeless tobacco: Never Used  . Alcohol use No     Allergies   Shellfish allergy; Sulfa antibiotics; and Icy hot   Review of Systems Review of Systems  10 Systems reviewed and are negative for acute change except as noted in the HPI.   Physical Exam Updated Vital Signs BP 128/79 (BP Location: Left Arm)   Pulse 103   Temp 99.3 F (37.4 C) (Oral)   Resp 20   Ht 5\' 2"  (1.575 m)   Wt 85.7 kg   LMP 09/22/2016   SpO2 100%   BMI 34.57 kg/m   Physical Exam  Constitutional: She appears  well-developed and well-nourished.  HENT:  Head: Normocephalic.  Right Ear: External ear normal.  Left Ear: External ear normal.  Mouth/Throat: Oropharynx is clear and moist. No oropharyngeal exudate.  No drooling or stridor. Posterior pharynx mildly erythematous no significant tonsillar hypertrophy. No exudate. Soft palate rises symmetrically. No TTP or induration under tongue.   No tenderness to palpation of frontal or bilateral maxillary sinuses.  Mild mucosal edema in the nares with scant rhinorrhea.  Bilateral tympanic membranes with normal architecture and good light reflex.    Eyes: Conjunctivae and EOM are normal. Pupils are equal, round, and reactive to light.  Neck: Normal range of motion. Neck supple.  Cardiovascular: Normal rate and regular rhythm.   Pulmonary/Chest: Effort normal and breath sounds normal. No stridor. No respiratory distress. She has no wheezes. She has no rales. She exhibits no tenderness.  Abdominal: Soft. There is no tenderness. There is no rebound and no guarding.  Nursing note and vitals reviewed.    ED Treatments / Results  DIAGNOSTIC STUDIES: Oxygen Saturation is 100% on RA, normal by my interpretation.    COORDINATION OF CARE: 5:15 PM Discussed treatment plan with pt at bedside which includes Tamiflu and pt agreed to plan.  Labs (all labs ordered are listed, but only abnormal results are displayed) Labs Reviewed - No data to display  EKG  EKG Interpretation None       Radiology No results found.  Procedures Procedures (including critical care time)  Medications Ordered in ED Medications - No data to display   Initial Impression / Assessment and Plan / ED Course  I have reviewed the triage vital signs and the nursing notes.  Pertinent labs & imaging results that were available during my care of the patient were reviewed by me and considered in my medical decision making (see chart for details).  Clinical Course    Vitals:     10/13/16 1657 10/13/16 1658  BP: 128/79   Pulse: 103   Resp: 20   Temp: 99.3 F (37.4 C)   TempSrc: Oral   SpO2: 100%   Weight:  85.7 kg  Height:  5\' 2"  (1.575 m)    Medications - No data to display  Lindsie Messenger is 25 y.o. female presenting with Constellation of symptoms consistent with influenza-like illness, lung sounds are clear, patient saturating well on room air, mild elevation in temperature and tachycardia but overall nontoxic appearing. She denies cough, I don't think there is a opportunistic pneumonia. She has underlying asthma, prescription written for Tamiflu, counseled her on infection prevention, Vicodin for pain control.  Evaluation does not show pathology that would require ongoing emergent intervention or inpatient treatment. Pt is hemodynamically stable and mentating appropriately. Discussed findings and plan with patient/guardian, who agrees with care plan. All questions answered. Return precautions  discussed and outpatient follow up given.    Final Clinical Impressions(s) / ED Diagnoses   Final diagnoses:  Influenza-like illness    New Prescriptions Discharge Medication List as of 10/13/2016  5:20 PM    START taking these medications   Details  HYDROcodone-acetaminophen (NORCO/VICODIN) 5-325 MG tablet Take 1-2 tablets by mouth every 6 hours as needed for pain and/or cough., Print    oseltamivir (TAMIFLU) 75 MG capsule Take 1 capsule (75 mg total) by mouth every 12 (twelve) hours., Starting Sun 10/13/2016, Print        I personally performed the services described in this documentation, which was scribed in my presence. The recorded information has been reviewed and is accurate.    Monico Blitz, PA-C 10/13/16 2109    Virgel Manifold, MD 10/21/16 1149

## 2016-10-13 NOTE — ED Notes (Signed)
Pt reports fever, sore throat and body aches x 2 days.

## 2016-10-13 NOTE — Discharge Instructions (Signed)
Return to the emergency room for any worsening or concerning symptoms including fast breathing, heart racing, confusion, vomiting. ° °Rest, cover your mouth when you cough and wash your hands frequently.  ° °Push fluids: water or Gatorade, do not drink any soda, juice or caffeinated beverages. ° °For fever and pain control you can take Motrin (ibuprofen) as follows: 400 mg (this is normally 2 over the counter pills) every 4 hours with food. ° °Do not return to work until a day after your fever breaks.  ° °Take Vicodin for cough and pain control, do not drink alcohol, drive, care for children or do other critical tasks while taking Vicodin    °

## 2016-10-21 ENCOUNTER — Telehealth: Payer: Self-pay | Admitting: General Practice

## 2016-10-21 ENCOUNTER — Encounter (HOSPITAL_COMMUNITY): Payer: Self-pay

## 2016-10-21 ENCOUNTER — Emergency Department (HOSPITAL_COMMUNITY)
Admission: EM | Admit: 2016-10-21 | Discharge: 2016-10-22 | Disposition: A | Discharge status: Home / Self Care | Payer: Medicaid Other | Attending: Emergency Medicine | Admitting: Emergency Medicine

## 2016-10-21 DIAGNOSIS — J45909 Unspecified asthma, uncomplicated: Secondary | ICD-10-CM | POA: Diagnosis not present

## 2016-10-21 DIAGNOSIS — J029 Acute pharyngitis, unspecified: Secondary | ICD-10-CM | POA: Insufficient documentation

## 2016-10-21 DIAGNOSIS — R05 Cough: Secondary | ICD-10-CM | POA: Insufficient documentation

## 2016-10-21 DIAGNOSIS — Z87891 Personal history of nicotine dependence: Secondary | ICD-10-CM | POA: Diagnosis not present

## 2016-10-21 DIAGNOSIS — R69 Illness, unspecified: Secondary | ICD-10-CM

## 2016-10-21 DIAGNOSIS — J111 Influenza due to unidentified influenza virus with other respiratory manifestations: Secondary | ICD-10-CM

## 2016-10-21 LAB — RAPID STREP SCREEN (MED CTR MEBANE ONLY): Streptococcus, Group A Screen (Direct): NEGATIVE

## 2016-10-21 NOTE — Telephone Encounter (Signed)
Patient called and left message stating she recently was sick with a sore throat and the flu. Patient states she feels like she has a herpes outbreak in her throat. Called patient back and she states her throat has continued to hurt and she recently looked at it with a mirror and there was red bumps and everything that looks like a herpes outbreak. Explained to patient that was highly unlikely and she could have strep. Patient states it doesn't look like strep and she feels like she needs to be seen here because its herpes. Recommended to patient she go to urgent care for evaluation. Patient insisted on appt in our office. Told patient someone from the front office will contact her with an appt. Patient verbalized understanding & hung up.

## 2016-10-21 NOTE — ED Triage Notes (Signed)
Pt was treated for the flu one week ago and now she complains of a sore throat and a dry cough

## 2016-10-22 ENCOUNTER — Emergency Department (HOSPITAL_COMMUNITY): Payer: Medicaid Other

## 2016-10-22 ENCOUNTER — Telehealth: Payer: Self-pay | Admitting: *Deleted

## 2016-10-22 MED ORDER — IBUPROFEN 200 MG PO TABS
400.0000 mg | ORAL_TABLET | Freq: Once | ORAL | Status: AC
  Administered 2016-10-22: 400 mg via ORAL
  Filled 2016-10-22: qty 2

## 2016-10-22 MED FILL — valACYclovir HCL 1 GM TABS: 1 | 10 days supply | Qty: 20 | Fill #1

## 2016-10-22 MED FILL — MONO-LINYAH 28 TABLET: 0.25-35 | 84 days supply | Qty: 84 | Fill #4

## 2016-10-22 NOTE — ED Notes (Signed)
Patient was alert, oriented and stable upon discharge. RN went over AVS and patient had no further questions.  

## 2016-10-22 NOTE — Discharge Instructions (Signed)
Your chest x-ray and strep test are negative. The swab that was taken on the throat will be sent for further testing, sits abnormal you'll receive a phone call.  For pain control please take ibuprofen (also known as Motrin or Advil) 800mg  (this is normally 4 over the counter pills) 3 times a day  for 5 days. Take with food to minimize stomach irritation.  Please follow with your primary care doctor in the next 2 days for a check-up. They must obtain records for further management.   Do not hesitate to return to the Emergency Department for any new, worsening or concerning symptoms.

## 2016-10-22 NOTE — Telephone Encounter (Signed)
Please see phone call from 10/21/16 where this was addressed with patient.

## 2016-10-22 NOTE — ED Provider Notes (Signed)
[facility] DEPT Provider Note   CSN: [id number] Arrival date & time: 10/21/16  2223     History   Chief Complaint Chief Complaint  Patient presents with  . Sore Throat     HPI   Blood pressure 141/87, pulse 79, temperature 100.2 F (37.9 C), temperature source Oral, resp. rate 20, last menstrual period 09/22/2016, SpO2 100 %, not currently breastfeeding.  Kim Johns is a 25 y.o. female recently treated for influenza-like illness with Tamiflu and was initially feeling better but she has a severe sore throat and she said there are bumps on the back of her throat she also reports dry cough and myalgia. Denies chest pain, shortness of breath, nausea, vomiting, headache, focal neck pain, change in bowel or bladder habits. No pain medication taken prior to arrival.  Past Medical History:  Diagnosis Date  . Abscess of breast   . Allergy   . Asthma    albuterol last used a few months ago  . HSV-2 (herpes simplex virus 2) infection   . Preterm labor     Patient Active Problem List   Diagnosis Date Noted  . 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 Infection associated with intrauterine device (IUD) ([place]) 10/25/2015  . TOA (tubo-ovarian abscess) 10/25/2015  . PID (acute pelvic inflammatory disease) 10/24/2015    Past Surgical History:  Procedure Laterality Date  . CERVICAL CERCLAGE N/A 04/05/2015   Procedure: CERCLAGE CERVICAL;  Surgeon: Lavonia Drafts, MD;  Location: [facility];  Service: Gynecology;  Laterality: N/A;  . WISDOM TOOTH EXTRACTION      OB History    Gravida Para Term Preterm AB Living   2 2 1 1  0 1   SAB TAB Ectopic Multiple Live Births   0 0 0 0 1       Home Medications    Prior to Admission medications   Medication Sig Start Date End Date Taking? Authorizing Provider  albuterol (PROVENTIL HFA;VENTOLIN HFA) 108 (90 BASE) MCG/ACT inhaler Inhale 2 puffs into the lungs every 6 (six) hours as needed for wheezing or shortness of  breath. Use spacer 07/01/15   Gwynne Edinger, MD  doxycycline (VIBRAMYCIN) 100 MG capsule Take 1 capsule (100 mg total) by mouth 2 (two) times daily. 06/14/16   Jorje Guild, NP  fluconazole (DIFLUCAN) 150 MG tablet Take 1 tablet (150 mg total) by mouth daily. 06/14/16   Jorje Guild, NP  HYDROcodone-acetaminophen (NORCO/VICODIN) 5-325 MG tablet Take 1-2 tablets by mouth every 6 hours as needed for pain and/or cough. 10/13/16   Jalilah Wiltsie, PA-C  oseltamivir (TAMIFLU) 75 MG capsule Take 1 capsule (75 mg total) by mouth every 12 (twelve) hours. 10/13/16   Zayley Arras, PA-C  SPRINTEC 28 0.25-35 MG-MCG tablet TAKE 1 TABLET BY MOUTH EVERY DAY (ONLY TAKE ACTIVE PILLS CONTINUOUSLY) 08/05/16   Osborne Oman, MD  valACYclovir (VALTREX) 1000 MG tablet Take 1 tablet (1,000 mg total) by mouth 2 (two) times daily. Patient taking differently: Take 1,000 mg by mouth 2 (two) times daily as needed (outbreaks).  01/02/16   Jorje Guild, NP    Family History Family History  Problem Relation Age of Onset  . Diabetes Mother   . Stroke Mother   . Hypertension Father     Social History Social History  Substance Use Topics  . Smoking status: Former Smoker    Packs/day: 0.50    Years: 10.00    Types: Cigarettes  . Smokeless tobacco: Never Used  . Alcohol  use No     Allergies   Shellfish allergy; Sulfa antibiotics; and Icy hot   Review of Systems Review of Systems  10 systems reviewed and found to be negative, except as noted in the HPI.  Physical Exam Updated Vital Signs BP 141/87   Pulse 79   Temp 100.2 F (37.9 C) (Oral)   Resp 20   LMP 09/22/2016   SpO2 100%   Physical Exam  Constitutional: She appears well-developed and well-nourished.  HENT:  Head: Normocephalic.  Right Ear: External ear normal.  Left Ear: External ear normal.  Mouth/Throat: Oropharynx is clear and moist. No oropharyngeal exudate.  No drooling or stridor. Posterior pharynx mildly erythematous no  significant tonsillar hypertrophy. No exudate. Soft palate rises symmetrically. No TTP or induration under tongue.   No tenderness to palpation of frontal or bilateral maxillary sinuses.  Mild mucosal edema in the nares with scant rhinorrhea.  Bilateral tympanic membranes with normal architecture and good light reflex.    Eyes: Conjunctivae and EOM are normal. Pupils are equal, round, and reactive to light.  Neck: Normal range of motion. Neck supple.  Cardiovascular: Normal rate and regular rhythm.   Pulmonary/Chest: Effort normal and breath sounds normal. No stridor. No respiratory distress. She has no wheezes. She has no rales. She exhibits no tenderness.  Abdominal: Soft. There is no tenderness. There is no rebound and no guarding.  Nursing note and vitals reviewed.    ED Treatments / Results  Labs (all labs ordered are listed, but only abnormal results are displayed) Labs Reviewed  RAPID STREP SCREEN (NOT AT [facility])  CULTURE, GROUP A STREP [facility])    EKG  EKG Interpretation None       Radiology Dg Chest 2 View  Result Date: 10/22/2016 CLINICAL DATA:  Cough, congestion, and fever for 1 week. Recent flu diagnosis. Sore throat. History of asthma. EXAM: CHEST  2 VIEW COMPARISON:  None. FINDINGS: Shallow inspiration. Normal heart size and pulmonary vascularity. No focal airspace disease or consolidation in the lungs. No blunting of costophrenic angles. No pneumothorax. Mediastinal contours appear intact. IMPRESSION: No active cardiopulmonary disease. Electronically Signed   By: Lucienne Capers M.D.   On: 10/22/2016 00:32    Procedures Procedures (including critical care time)  Medications Ordered in ED Medications  ibuprofen (ADVIL,MOTRIN) tablet 400 mg (400 mg Oral Given 10/22/16 0026)     Initial Impression / Assessment and Plan / ED Course  I have reviewed the triage vital signs and the nursing notes.  Pertinent labs & imaging results that were available during my  care of the patient were reviewed by me and considered in my medical decision making (see chart for details).  Clinical Course     Vitals:   10/21/16 2230 10/22/16 0024 10/22/16 0024  BP: 148/93 141/87   Pulse: 104 79   Resp: 20    Temp: 98.7 F (37.1 C)  100.2 F (37.9 C)  TempSrc: Oral  Oral  SpO2: 99% 100%     Medications  ibuprofen (ADVIL,MOTRIN) tablet 400 mg (400 mg Oral Given 10/22/16 0026)    Kim Johns is 25 y.o. female presenting with Persistent sore throat and dry cough, was recently treated for flu. Patient states that she sees bumps on the posterior pharynx, there is a mild injection with no tonsillar hypertrophy, lymphadenopathy, signs of peritonsillar abscess. Patient is swallowing her secretions without issue. I've tried to reassure her that the physical exam is without acute abnormality. Chest x-ray with no infiltrate.  Lung sounds clear to auscultation. Advised ibuprofen for comfort and close follow-up with primary care physician.  Evaluation does not show pathology that would require ongoing emergent intervention or inpatient treatment. Pt is hemodynamically stable and mentating appropriately. Discussed findings and plan with patient/guardian, who agrees with care plan. All questions answered. Return precautions discussed and outpatient follow up given.      Final Clinical Impressions(s) / ED Diagnoses   Final diagnoses:  Influenza-like illness    New Prescriptions New Prescriptions   No medications on file     Monico Blitz, PA-C 10/22/16 [place], MD 10/22/16 [phone]

## 2016-10-22 NOTE — Telephone Encounter (Signed)
Emiyah called 10/21/16 am and left a message stating she had called before and waiting on call back about possible herpes in her throat. States needs to be seen today. States was in hospital last week for flu like symptoms- now when she looks in her throat and sees what is back there. Wants a call about being seen.

## 2016-10-24 LAB — CULTURE, GROUP A STREP (THRC)

## 2016-10-31 ENCOUNTER — Emergency Department (HOSPITAL_COMMUNITY): Payer: Medicaid Other

## 2016-10-31 ENCOUNTER — Emergency Department (HOSPITAL_COMMUNITY)
Admission: EM | Admit: 2016-10-31 | Discharge: 2016-10-31 | Disposition: A | Discharge status: Home / Self Care | Payer: Medicaid Other | Attending: Emergency Medicine | Admitting: Emergency Medicine

## 2016-10-31 ENCOUNTER — Encounter (HOSPITAL_COMMUNITY): Payer: Self-pay | Admitting: Emergency Medicine

## 2016-10-31 DIAGNOSIS — J45909 Unspecified asthma, uncomplicated: Secondary | ICD-10-CM | POA: Insufficient documentation

## 2016-10-31 DIAGNOSIS — Z87891 Personal history of nicotine dependence: Secondary | ICD-10-CM | POA: Insufficient documentation

## 2016-10-31 DIAGNOSIS — Z79899 Other long term (current) drug therapy: Secondary | ICD-10-CM | POA: Insufficient documentation

## 2016-10-31 DIAGNOSIS — R002 Palpitations: Secondary | ICD-10-CM | POA: Diagnosis not present

## 2016-10-31 HISTORY — DX: Anxiety disorder, unspecified: F41.9

## 2016-10-31 LAB — BASIC METABOLIC PANEL
ANION GAP: 7 (ref 5–15)
BUN: 7 mg/dL (ref 6–20)
CHLORIDE: 110 mmol/L (ref 101–111)
CO2: 21 mmol/L — AB (ref 22–32)
Calcium: 9.3 mg/dL (ref 8.9–10.3)
Creatinine, Ser: 0.79 mg/dL (ref 0.44–1.00)
GFR calc non Af Amer: >60 mL/min (ref >60)
GLUCOSE: 94 mg/dL (ref 65–99)
POTASSIUM: 3.6 mmol/L (ref 3.5–5.1)
SODIUM: 138 mmol/L (ref 135–145)

## 2016-10-31 LAB — I-STAT TROPONIN, ED: Troponin i, poc: 0 ng/mL (ref 0.00–0.08)

## 2016-10-31 LAB — CBC
HEMATOCRIT: 38.5 % (ref 36.0–46.0)
HEMOGLOBIN: 13.5 g/dL (ref 12.0–15.0)
MCH: 32.2 pg (ref 26.0–34.0)
MCHC: 35.1 g/dL (ref 30.0–36.0)
MCV: 91.9 fL (ref 78.0–100.0)
Platelets: 347 10*3/uL (ref 150–400)
RBC: 4.19 MIL/uL (ref 3.87–5.11)
RDW: 12.5 % (ref 11.5–15.5)
WBC: 7.6 10*3/uL (ref 4.0–10.5)

## 2016-10-31 MED ORDER — CEPHALEXIN 500 MG PO CAPS
500.0000 mg | ORAL_CAPSULE | Freq: Four times a day (QID) | ORAL | 0 refills | Status: DC
Start: 2016-10-31 — End: 2017-03-25

## 2016-10-31 NOTE — ED Triage Notes (Signed)
Patient reports central chest pain with mild SOB onset this evening , denies emesis or diaphoresis , pt. added palpitations this weekend .

## 2016-10-31 NOTE — ED Provider Notes (Signed)
[facility] DEPT Provider Note   CSN: [id number] Arrival date & time: 10/31/16  0010     History   Chief Complaint Chief Complaint  Patient presents with  . Chest Pain  . Palpitations    HPI Kim Johns is a 25 y.o. female.  The patient reports having a panic attack a week and a half ago and since then has been having the sensation of skipping heart beats and "feeling strange". No cough, fever, SOB. She has associated chest pain described as sharp. Symptoms come and go. She denies drug use. No new medications. No nausea or vomiting.    The history is provided by the patient. No language interpreter was used.  Chest Pain   Associated symptoms include palpitations. Pertinent negatives include no cough, no fever, no nausea, no shortness of breath and no vomiting.  Palpitations   Associated symptoms include chest pain. Pertinent negatives include no fever, no nausea, no vomiting, no cough and no shortness of breath.    Past Medical History:  Diagnosis Date  . Abscess of breast   . Allergy   . Anxiety   . Asthma    albuterol last used a few months ago  . HSV-2 (herpes simplex virus 2) infection   . Preterm labor     Patient Active Problem List   Diagnosis Date Noted  . 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 Infection associated with intrauterine device (IUD) ([place]) 10/25/2015  . TOA (tubo-ovarian abscess) 10/25/2015  . PID (acute pelvic inflammatory disease) 10/24/2015    Past Surgical History:  Procedure Laterality Date  . CERVICAL CERCLAGE N/A 04/05/2015   Procedure: CERCLAGE CERVICAL;  Surgeon: Lavonia Drafts, MD;  Location: [facility];  Service: Gynecology;  Laterality: N/A;  . WISDOM TOOTH EXTRACTION      OB History    Gravida Para Term Preterm AB Living   2 2 1 1  0 1   SAB TAB Ectopic Multiple Live Births   0 0 0 0 1       Home Medications    Prior to Admission medications   Medication Sig Start Date End Date Taking?  Authorizing Provider  albuterol (PROVENTIL HFA;VENTOLIN HFA) 108 (90 BASE) MCG/ACT inhaler Inhale 2 puffs into the lungs every 6 (six) hours as needed for wheezing or shortness of breath. Use spacer 07/01/15   Gwynne Edinger, MD  doxycycline (VIBRAMYCIN) 100 MG capsule Take 1 capsule (100 mg total) by mouth 2 (two) times daily. 06/14/16   Jorje Guild, NP  fluconazole (DIFLUCAN) 150 MG tablet Take 1 tablet (150 mg total) by mouth daily. 06/14/16   Jorje Guild, NP  HYDROcodone-acetaminophen (NORCO/VICODIN) 5-325 MG tablet Take 1-2 tablets by mouth every 6 hours as needed for pain and/or cough. 10/13/16   Nicole Pisciotta, PA-C  oseltamivir (TAMIFLU) 75 MG capsule Take 1 capsule (75 mg total) by mouth every 12 (twelve) hours. 10/13/16   Nicole Pisciotta, PA-C  SPRINTEC 28 0.25-35 MG-MCG tablet TAKE 1 TABLET BY MOUTH EVERY DAY (ONLY TAKE ACTIVE PILLS CONTINUOUSLY) 08/05/16   Osborne Oman, MD  valACYclovir (VALTREX) 1000 MG tablet Take 1 tablet (1,000 mg total) by mouth 2 (two) times daily. Patient taking differently: Take 1,000 mg by mouth 2 (two) times daily as needed (outbreaks).  01/02/16   Jorje Guild, NP    Family History Family History  Problem Relation Age of Onset  . Diabetes Mother   . Stroke Mother   . Hypertension Father     Social  History Social History  Substance Use Topics  . Smoking status: Former Smoker    Packs/day: 0.50    Years: 10.00    Types: Cigarettes  . Smokeless tobacco: Never Used  . Alcohol use No     Allergies   Shellfish allergy; Sulfa antibiotics; and [place] hot   Review of Systems Review of Systems  Constitutional: Negative for chills and fever.  HENT: Negative.   Respiratory: Negative.  Negative for cough and shortness of breath.   Cardiovascular: Positive for chest pain and palpitations.       See HPI.  Gastrointestinal: Negative.  Negative for nausea and vomiting.  Musculoskeletal: Negative.   Skin: Negative.   Neurological: Negative.       Physical Exam Updated Vital Signs BP 130/58 (BP Location: Left Arm)   Pulse 65   Temp 98.5 F (36.9 C) (Oral)   Resp 18   LMP 09/22/2016   SpO2 100%   Physical Exam  Constitutional: She is oriented to person, place, and time. She appears well-developed and well-nourished.  HENT:  Head: Normocephalic.  Neck: Normal range of motion. Neck supple.  Cardiovascular: Normal rate, regular rhythm and normal heart sounds.   No murmur heard. Pulmonary/Chest: Effort normal and breath sounds normal. She has no wheezes. She has no rales.  Abdominal: Soft. Bowel sounds are normal. There is no tenderness. There is no rebound and no guarding.  Musculoskeletal: Normal range of motion.  Neurological: She is alert and oriented to person, place, and time.  Skin: Skin is warm and dry. No rash noted.  Psychiatric: She has a normal mood and affect.     ED Treatments / Results  Labs (all labs ordered are listed, but only abnormal results are displayed) Labs Reviewed  BASIC METABOLIC PANEL - Abnormal; Notable for the following:       Result Value   CO2 21 (*)    All other components within normal limits  CBC  I-STAT TROPOININ, ED   Results for orders placed or performed during the hospital encounter of [id number]  Basic metabolic panel  Result Value Ref Range   Sodium 138 135 - 145 mmol/L   Potassium 3.6 3.5 - 5.1 mmol/L   Chloride 110 101 - 111 mmol/L   CO2 21 (L) 22 - 32 mmol/L   Glucose, Bld 94 65 - 99 mg/dL   BUN 7 6 - 20 mg/dL   Creatinine, Ser 0.79 0.44 - 1.00 mg/dL   Calcium 9.3 8.9 - 10.3 mg/dL   GFR calc non Af Amer >60 >60 mL/min   GFR calc Af Amer >60 >60 mL/min   Anion gap 7 5 - 15  CBC  Result Value Ref Range   WBC 7.6 4.0 - 10.5 K/uL   RBC 4.19 3.87 - 5.11 MIL/uL   Hemoglobin 13.5 12.0 - 15.0 g/dL   HCT 38.5 36.0 - 46.0 %   MCV 91.9 78.0 - 100.0 fL   MCH 32.2 26.0 - 34.0 pg   MCHC 35.1 30.0 - 36.0 g/dL   RDW 12.5 11.5 - 15.5 %   Platelets 347 150 - 400 K/uL   I-stat troponin, ED  Result Value Ref Range   Troponin i, poc 0.00 0.00 - 0.08 ng/mL   Comment 3            .EKG  EKG Interpretation  Date/Time:  Thursday October 31 2016 00:21:06 EST Ventricular Rate:  65 PR Interval:  138 QRS Duration: 72 QT Interval:  408 QTC Calculation:  424 R Axis:   88 Text Interpretation:  Normal sinus rhythm Normal ECG No old tracing to compare Confirmed by Glynn Octave [phone]) on 10/31/2016 3:26:41 AM       Radiology Dg Chest 2 View  Result Date: 10/31/2016 CLINICAL DATA:  Intermittent chest pain and palpitations for 2 weeks. EXAM: CHEST  2 VIEW COMPARISON:  Chest radiograph October 22, 2016 FINDINGS: Cardiomediastinal silhouette is normal. No pleural effusions or focal consolidations. Trachea projects midline and there is no pneumothorax. Soft tissue planes and included osseous structures are non-suspicious. IMPRESSION: Normal chest radiograph. Electronically Signed   By: Elon Alas M.D.   On: 10/31/2016 01:16    Procedures Procedures (including critical care time)  Medications Ordered in ED Medications - No data to display   Initial Impression / Assessment and Plan / ED Course  I have reviewed the triage vital signs and the nursing notes.  Pertinent labs & imaging results that were available during my care of the patient were reviewed by me and considered in my medical decision making (see chart for details).  Clinical Course     Patient having a sense of skip heart beats associated with sharp chest pain. Symptoms for 1.5 weeks. Labs, including troponin, are negative. CXR clear. EKG NSR. She is well appearing.   She is reassured and can be discharged home with PCP follow up.   Final Clinical Impressions(s) / ED Diagnoses   Final diagnoses:  None   1. Palpitations  New Prescriptions New Prescriptions   No medications on file     Charlann Lange, Hershal Coria 10/31/16 [id number]  ADDENDUM: The patient, on discharge, mentioned a  swollen painful area on the groin x 1 week. No drainage, fever. On exam there is a small nodual red swelling c/w folliculitis. Keflex will be provided.     Charlann Lange, PA-C 10/31/16 [id number]    Everlene Balls, MD 10/31/16 [phone]

## 2016-11-08 ENCOUNTER — Ambulatory Visit (INDEPENDENT_AMBULATORY_CARE_PROVIDER_SITE_OTHER): Payer: Medicaid Other | Admitting: Obstetrics and Gynecology

## 2016-11-08 ENCOUNTER — Encounter: Payer: Self-pay | Admitting: Obstetrics and Gynecology

## 2016-11-08 ENCOUNTER — Other Ambulatory Visit (HOSPITAL_COMMUNITY)
Admission: RE | Admit: 2016-11-08 | Discharge: 2016-11-08 | Disposition: A | Discharge status: Home / Self Care | Payer: Medicaid Other | Source: Ambulatory Visit | Attending: Obstetrics and Gynecology | Admitting: Obstetrics and Gynecology

## 2016-11-08 VITALS — BP 124/71 | HR 68 | Wt 182.0 lb

## 2016-11-08 DIAGNOSIS — Z113 Encounter for screening for infections with a predominantly sexual mode of transmission: Secondary | ICD-10-CM | POA: Diagnosis not present

## 2016-11-08 DIAGNOSIS — R102 Pelvic and perineal pain: Secondary | ICD-10-CM

## 2016-11-08 LAB — WET PREP, GENITAL
CLUE CELLS WET PREP: NONE SEEN
Trich, Wet Prep: NONE SEEN
Yeast Wet Prep HPF POC: NONE SEEN

## 2016-11-08 LAB — POCT URINALYSIS DIP (DEVICE)
BILIRUBIN URINE: NEGATIVE
GLUCOSE, UA: NEGATIVE mg/dL
Hgb urine dipstick: NEGATIVE
KETONES UR: NEGATIVE mg/dL
LEUKOCYTES UA: NEGATIVE
NITRITE: NEGATIVE
Protein, ur: NEGATIVE mg/dL
Specific Gravity, Urine: 1.015 (ref 1.005–1.030)
Urobilinogen, UA: 0.2 mg/dL (ref 0.0–1.0)
pH: 7 (ref 5.0–8.0)

## 2016-11-08 NOTE — Progress Notes (Signed)
25 yo G2P1011 here for evaluation of pelvic pain. Patient reports intermittent pain for the past few months but it seems to become more consistent. The pain seems to originate in her pelvis and radiates to her left hip and back. She reports some dyspareunia. She is reports some vaginal discharge but denies pruritis or an odor  Past Medical History:  Diagnosis Date  . Abscess of breast   . Allergy   . Anxiety   . Asthma    albuterol last used a few months ago  . HSV-2 (herpes simplex virus 2) infection   . Preterm labor    Past Surgical History:  Procedure Laterality Date  . CERVICAL CERCLAGE N/A 04/05/2015   Procedure: CERCLAGE CERVICAL;  Surgeon: Lavonia Drafts, MD;  Location: [facility];  Service: Gynecology;  Laterality: N/A;  . WISDOM TOOTH EXTRACTION     Family History  Problem Relation Age of Onset  . Diabetes Mother   . Stroke Mother   . Hypertension Father    Social History  Substance Use Topics  . Smoking status: Former Smoker    Packs/day: 0.50    Years: 10.00    Types: Cigarettes  . Smokeless tobacco: Never Used  . Alcohol use No   ROS See pertinent in HPI  GENERAL: Well-developed, well-nourished female in no acute distress.  ABDOMEN: Soft, nontender, nondistended. No organomegaly. PELVIC: Normal external female genitalia. Vagina is pink and rugated.  Normal discharge. Normal appearing cervix. Uterus is normal in size. No adnexal mass or tenderness. EXTREMITIES: No cyanosis, clubbing, or edema, 2+ distal pulses.  A/P 25 yo with chronic pelvic pain - Wet prep collected - Cultures collected - Pelvic ultrasound and urine culture ordered - patient will be contacted with any abnormal results - RTC prn

## 2016-11-08 NOTE — Addendum Note (Signed)
Addended by: Riccardo Dubin on: 11/08/2016 12:41 PM   Modules accepted: Orders

## 2016-11-09 LAB — URINE CULTURE: Organism ID, Bacteria: NO GROWTH

## 2016-11-11 LAB — GC/CHLAMYDIA PROBE AMP (~~LOC~~) NOT AT ARMC
Chlamydia: NEGATIVE
Neisseria Gonorrhea: NEGATIVE

## 2016-11-14 ENCOUNTER — Telehealth: Payer: Self-pay | Admitting: *Deleted

## 2016-11-14 NOTE — Telephone Encounter (Signed)
Patient left message on nurse voicemail on 11/13/16 at 1150.  States she would like the results from her test on Friday.  Requests a return call and states it is okay to leave a message on voicemail if she doesn't answer.

## 2016-11-14 NOTE — Telephone Encounter (Signed)
Called pt and informed her of all lab test results which were performed on 12/15.  Pt voiced understanding.

## 2016-11-19 ENCOUNTER — Ambulatory Visit (HOSPITAL_COMMUNITY)
Admission: RE | Admit: 2016-11-19 | Discharge: 2016-11-19 | Disposition: A | Discharge status: Home / Self Care | Payer: Medicaid Other | Source: Ambulatory Visit | Attending: Obstetrics and Gynecology | Admitting: Obstetrics and Gynecology

## 2016-11-19 DIAGNOSIS — N83201 Unspecified ovarian cyst, right side: Secondary | ICD-10-CM | POA: Insufficient documentation

## 2016-11-19 DIAGNOSIS — R102 Pelvic and perineal pain: Secondary | ICD-10-CM | POA: Insufficient documentation

## 2016-11-21 ENCOUNTER — Telehealth: Payer: Self-pay | Admitting: *Deleted

## 2016-11-21 NOTE — Telephone Encounter (Signed)
Called pt and informed her of ultrasound results per request of Dr. Elly Modena. Pt advised that ultrasound showed the presence of a right ovarian cyst which has a normal appearance and is expected to disappear over time. No further ultrasounds or surgical intervention is indicated.  She may use ibuprofen and heating pad for her pain. Pt voiced understanding and asked if she may schedule another appt if she is still having pain after several months. I stated yes.

## 2016-11-26 ENCOUNTER — Ambulatory Visit: Payer: Medicaid Other | Admitting: Family Medicine

## 2016-12-17 ENCOUNTER — Other Ambulatory Visit: Payer: Self-pay

## 2016-12-17 ENCOUNTER — Encounter: Payer: Self-pay | Admitting: Licensed Clinical Social Worker

## 2016-12-17 ENCOUNTER — Ambulatory Visit: Payer: Medicaid Other | Attending: Family Medicine | Admitting: Family Medicine

## 2016-12-17 VITALS — BP 101/67 | HR 61 | Temp 98.1°F | Resp 18 | Ht 64.0 in | Wt 186.8 lb

## 2016-12-17 DIAGNOSIS — J45909 Unspecified asthma, uncomplicated: Secondary | ICD-10-CM | POA: Diagnosis not present

## 2016-12-17 DIAGNOSIS — Z87898 Personal history of other specified conditions: Secondary | ICD-10-CM

## 2016-12-17 DIAGNOSIS — M5441 Lumbago with sciatica, right side: Secondary | ICD-10-CM | POA: Diagnosis not present

## 2016-12-17 DIAGNOSIS — R001 Bradycardia, unspecified: Secondary | ICD-10-CM

## 2016-12-17 DIAGNOSIS — G8929 Other chronic pain: Secondary | ICD-10-CM

## 2016-12-17 DIAGNOSIS — F419 Anxiety disorder, unspecified: Secondary | ICD-10-CM | POA: Insufficient documentation

## 2016-12-17 DIAGNOSIS — M2550 Pain in unspecified joint: Secondary | ICD-10-CM | POA: Diagnosis present

## 2016-12-17 MED ORDER — ALBUTEROL SULFATE HFA 108 (90 BASE) MCG/ACT IN AERS: 2.0000 | INHALATION_SPRAY | Freq: Four times a day (QID) | RESPIRATORY_TRACT | 2 refills | Status: DC | PRN

## 2016-12-17 MED ORDER — IBUPROFEN 800 MG PO TABS: 800.0000 mg | ORAL_TABLET | Freq: Three times a day (TID) | ORAL | 0 refills | Status: DC | PRN

## 2016-12-17 MED FILL — IBUPROFEN 800 MG TABLET: 800 | 13 days supply | Qty: 40 | Fill #0

## 2016-12-17 MED FILL — VENTOLIN HFA 90 MCG INHALER: 108 (90 BAS | 30 days supply | Qty: 18 | Fill #0

## 2016-12-17 NOTE — Patient Instructions (Addendum)
Echocardiogram An echocardiogram, or echocardiography, uses sound waves (ultrasound) to produce an image of your heart. The echocardiogram is simple, painless, obtained within a short period of time, and offers valuable information to your health care Denetta Fei. The images from an echocardiogram can provide information such as:  Evidence of coronary artery disease (CAD).  Heart size.  Heart muscle function.  Heart valve function.  Aneurysm detection.  Evidence of a past heart attack.  Fluid buildup around the heart.  Heart muscle thickening.  Assess heart valve function. Tell a health care Jaqulyn Chancellor about:  Any allergies you have.  All medicines you are taking, including vitamins, herbs, eye drops, creams, and over-the-counter medicines.  Any problems you or family members have had with anesthetic medicines.  Any blood disorders you have.  Any surgeries you have had.  Any medical conditions you have.  Whether you are pregnant or may be pregnant. What happens before the procedure? No special preparation is needed. Eat and drink normally. What happens during the procedure?  In order to produce an image of your heart, gel will be applied to your chest and a wand-like tool (transducer) will be moved over your chest. The gel will help transmit the sound waves from the transducer. The sound waves will harmlessly bounce off your heart to allow the heart images to be captured in real-time motion. These images will then be recorded.  You may need an IV to receive a medicine that improves the quality of the pictures. What happens after the procedure? You may return to your normal schedule including diet, activities, and medicines, unless your health care Mahli Glahn tells you otherwise. This information is not intended to replace advice given to you by your health care Ellason Segar. Make sure you discuss any questions you have with your health care Lauralye Kinn. Document Released: 11/08/2000  Document Revised: 06/29/2016 Document Reviewed: 07/19/2013 Elsevier Interactive Patient Education  2017 Elsevier Inc.  Musculoskeletal Pain Musculoskeletal pain is muscle and bone aches and pains. This pain can occur in any part of the body. Follow these instructions at home:  Only take medicines for pain, discomfort, or fever as told by your health care Kasin Tonkinson.  You may continue all activities unless the activities cause more pain. When the pain lessens, slowly resume normal activities. Gradually increase the intensity and duration of the activities or exercise.  During periods of severe pain, bed rest may be helpful. Lie or sit in any position that is comfortable, but get out of bed and walk around at least every several hours.  If directed, put ice on the injured area.  Put ice in a plastic bag.  Place a towel between your skin and the bag.  Leave the ice on for 20 minutes, 2-3 times a day. Contact a health care Dezaray Shibuya if:  Your pain is getting worse.  Your pain is not relieved with medicines.  You lose function in the area of the pain if the pain is in your arms, legs, or neck. This information is not intended to replace advice given to you by your health care Aubre Quincy. Make sure you discuss any questions you have with your health care Delinda Malan. Document Released: 11/11/2005 Document Revised: 04/23/2016 Document Reviewed: 07/16/2013 Elsevier Interactive Patient Education  2017 Elsevier Inc. Back Pain, Adult Introduction Back pain is very common. The pain often gets better over time. The cause of back pain is usually not dangerous. Most people can learn to manage their back pain on their own. Follow these instructions at  home: Watch your back pain for any changes. The following actions may help to lessen any pain you are feeling:  Stay active. Start with short walks on flat ground if you can. Try to walk farther each day.  Exercise regularly as told by your doctor.  Exercise helps your back heal faster. It also helps avoid future injury by keeping your muscles strong and flexible.  Do not sit, drive, or stand in one place for more than 30 minutes.  Do not stay in bed. Resting more than 1-2 days can slow down your recovery.  Be careful when you bend or lift an object. Use good form when lifting:  Bend at your knees.  Keep the object close to your body.  Do not twist.  Sleep on a firm mattress. Lie on your side, and bend your knees. If you lie on your back, put a pillow under your knees.  Take medicines only as told by your doctor.  Put ice on the injured area.  Put ice in a plastic bag.  Place a towel between your skin and the bag.  Leave the ice on for 20 minutes, 2-3 times a day for the first 2-3 days. After that, you can switch between ice and heat packs.  Avoid feeling anxious or stressed. Find good ways to deal with stress, such as exercise.  Maintain a healthy weight. Extra weight puts stress on your back. Contact a doctor if:  You have pain that does not go away with rest or medicine.  You have worsening pain that goes down into your legs or buttocks.  You have pain that does not get better in one week.  You have pain at night.  You lose weight.  You have a fever or chills. Get help right away if:  You cannot control when you poop (bowel movement) or pee (urinate).  Your arms or legs feel weak.  Your arms or legs lose feeling (numbness).  You feel sick to your stomach (nauseous) or throw up (vomit).  You have belly (abdominal) pain.  You feel like you may pass out (faint). This information is not intended to replace advice given to you by your health care Hyden Soley. Make sure you discuss any questions you have with your health care Leeya Rusconi. Document Released: 04/29/2008 Document Revised: 04/18/2016 Document Reviewed: 03/15/2014  2017 Elsevier

## 2016-12-17 NOTE — BH Specialist Note (Signed)
LCSWA introduced self and explained role at [facility] disclosed that she received a consult from [facility] to provide behavioral health resources.  Pt reported that she has been diagnosed with Anxiety. She stated that she has participated in medication management; however, discontinued use for approximately three years due to weight gain.   LCSWA discussed benefits of applying healthy coping skills to decrease anxiety symptoms. Pt was able to identify strategies and shared interest in psychotherapy.  LCSWA provided community resources for crisis intervention and psychotherapy. LCSWA encouraged pt to contact her or crisis resources if anxiety symptoms worsen or fail to improve.   Christa See, MSW, LCSWA Clinical Social Worker 12/17/16 4:15 PM

## 2016-12-17 NOTE — Progress Notes (Addendum)
Subjective:  Patient ID: Kim Johns, female    DOB: 1991/01/06  Age: 26 y.o. MRN: [id number]  CC: No chief complaint on file.   HPI Kim Johns presents for   Joint/Muscle Pain: Patient complains of arthralgias for which has been present for 2 months. Pain is located in the right hip(s), is described as aching, and is intermittent . Pain is 7/10.  Associated symptoms include: tingling and numbness of the right leg and 2nd -4th toes and limping when pain is at it's worst.  Cold weather aggravates symptoms. She denies history of injury or heavy lifting.The patient has tylenol with no relief. She also c/o random palpitations with activity and at rest and occasional racing thoughts. Denies CP. Reports history of anxiety with counseling and medication use in the past that included : xanex, klonopin, effexor, ri celexa, zolft . Reports stopping medication use 3 years ago because she did not to take medication. Denies SI/HI. Declines medication today.  Outpatient Medications Prior to Visit  Medication Sig Dispense Refill  . albuterol (PROVENTIL HFA;VENTOLIN HFA) 108 (90 BASE) MCG/ACT inhaler Inhale 2 puffs into the lungs every 6 (six) hours as needed for wheezing or shortness of breath. Use spacer 1 Inhaler 2  . cephALEXin (KEFLEX) 500 MG capsule Take 1 capsule (500 mg total) by mouth 4 (four) times daily. 28 capsule 0  . doxycycline (VIBRAMYCIN) 100 MG capsule Take 1 capsule (100 mg total) by mouth 2 (two) times daily. 28 capsule 0  . fluconazole (DIFLUCAN) 150 MG tablet Take 1 tablet (150 mg total) by mouth daily. 1 tablet 1  . HYDROcodone-acetaminophen (NORCO/VICODIN) 5-325 MG tablet Take 1-2 tablets by mouth every 6 hours as needed for pain and/or cough. 7 tablet 0  . oseltamivir (TAMIFLU) 75 MG capsule Take 1 capsule (75 mg total) by mouth every 12 (twelve) hours. 10 capsule 0  . SPRINTEC 28 0.25-35 MG-MCG tablet TAKE 1 TABLET BY MOUTH EVERY DAY (ONLY TAKE ACTIVE PILLS CONTINUOUSLY) 84  tablet 5  . valACYclovir (VALTREX) 1000 MG tablet Take 1 tablet (1,000 mg total) by mouth 2 (two) times daily. (Patient taking differently: Take 1,000 mg by mouth 2 (two) times daily as needed (outbreaks). ) 20 tablet 1   No facility-administered medications prior to visit.     ROS Review of Systems  Respiratory: Negative.   Cardiovascular: Positive for palpitations.  Gastrointestinal: Negative.   Musculoskeletal: Positive for arthralgias and back pain.  Skin: Negative.   Neurological: Negative.   Psychiatric/Behavioral: The patient is nervous/anxious.    Objective:  BP 101/67 (BP Location: Left Arm, Patient Position: Sitting, Cuff Size: Normal)   Pulse 61   Temp 98.1 F (36.7 C) (Oral)   Resp 18   Ht 5\' 4"  (1.626 m)   Wt 186 lb 12.8 oz (84.7 kg)   LMP 12/16/2016   SpO2 98%   BMI 32.06 kg/m   BP/Weight 12/17/2016 11/08/2016 [id number]  Systolic BP [id number]  Diastolic BP 67 71 84  Wt. (Lbs) 186.8 182 -  BMI 32.06 33.29 -    Physical Exam  Cardiovascular: Normal rate, regular rhythm, normal heart sounds and intact distal pulses.   Pulmonary/Chest: Effort normal and breath sounds normal.  Abdominal: Soft. Bowel sounds are normal.  Musculoskeletal: Normal range of motion.  Pain lower back extending to right hip  Skin: Skin is warm and dry.  Psychiatric: Her speech is normal and behavior is normal. Thought content normal. Her mood appears anxious. She expresses no homicidal and  no suicidal ideation. She expresses no suicidal plans and no homicidal plans.  Nursing note and vitals reviewed.  Assessment & Plan:   Problem List Items Addressed This Visit      Other   Anxiety   -LCSW provided patient with counseling resources.    Other Visit Diagnoses    Chronic right-sided low back pain with right-sided sciatica    -  Primary   Relevant Medications   ibuprofen (ADVIL,MOTRIN) 800 MG tablet   Other Relevant Orders   DG Lumbar Spine Complete (Completed)   Uncomplicated  asthma, unspecified asthma severity, unspecified whether persistent       Sinus bradycardia on ECG       Relevant Orders   ECHOCARDIOGRAM COMPLETE   History of palpitations       Relevant Orders   ECHOCARDIOGRAM COMPLETE      Meds ordered this encounter  Medications  . DISCONTD: albuterol (PROVENTIL HFA;VENTOLIN HFA) 108 (90 Base) MCG/ACT inhaler    Sig: Inhale 2 puffs into the lungs every 6 (six) hours as needed for wheezing or shortness of breath.    Dispense:  1 Inhaler    Refill:  2    Order Specific Question:   Supervising Provider    Answer:   Tresa Garter [id number]  . ibuprofen (ADVIL,MOTRIN) 800 MG tablet    Sig: Take 1 tablet (800 mg total) by mouth every 8 (eight) hours as needed for mild pain, moderate pain or cramping.    Dispense:  40 tablet    Refill:  0    Order Specific Question:   Supervising Provider    Answer:   Tresa Garter [id number]      Alfonse Spruce FNP

## 2016-12-17 NOTE — Progress Notes (Signed)
Patient is here for Hip pain that comes and go for couple months now pain level is a 7  Also patient complains have heart palpitations   Patient has not eaten today and have not taking any meds today

## 2016-12-18 ENCOUNTER — Ambulatory Visit (HOSPITAL_COMMUNITY)
Admission: RE | Admit: 2016-12-18 | Discharge: 2016-12-18 | Disposition: A | Discharge status: Home / Self Care | Payer: Medicaid Other | Source: Ambulatory Visit | Attending: Family Medicine | Admitting: Family Medicine

## 2016-12-18 DIAGNOSIS — M5441 Lumbago with sciatica, right side: Secondary | ICD-10-CM | POA: Insufficient documentation

## 2016-12-18 DIAGNOSIS — G8929 Other chronic pain: Secondary | ICD-10-CM | POA: Diagnosis present

## 2016-12-19 ENCOUNTER — Other Ambulatory Visit: Payer: Self-pay | Admitting: Family Medicine

## 2016-12-19 ENCOUNTER — Telehealth: Payer: Self-pay

## 2016-12-19 ENCOUNTER — Other Ambulatory Visit: Payer: Self-pay | Admitting: Pharmacist

## 2016-12-19 DIAGNOSIS — M5441 Lumbago with sciatica, right side: Secondary | ICD-10-CM

## 2016-12-19 DIAGNOSIS — M4310 Spondylolisthesis, site unspecified: Secondary | ICD-10-CM

## 2016-12-19 DIAGNOSIS — G8929 Other chronic pain: Secondary | ICD-10-CM

## 2016-12-19 MED ORDER — ALBUTEROL SULFATE HFA 108 (90 BASE) MCG/ACT IN AERS: 2.0000 | INHALATION_SPRAY | Freq: Four times a day (QID) | RESPIRATORY_TRACT | 2 refills | Status: DC | PRN

## 2016-12-19 NOTE — Telephone Encounter (Signed)
CMA call to inform her that I got her echocardiogram scheduled on 12/31/16 @ 8:00 am  At [facility] [place]

## 2016-12-23 ENCOUNTER — Other Ambulatory Visit: Payer: Self-pay | Admitting: Family Medicine

## 2016-12-23 DIAGNOSIS — G8929 Other chronic pain: Secondary | ICD-10-CM

## 2016-12-23 DIAGNOSIS — M4310 Spondylolisthesis, site unspecified: Secondary | ICD-10-CM

## 2016-12-23 DIAGNOSIS — M5441 Lumbago with sciatica, right side: Principal | ICD-10-CM

## 2016-12-23 NOTE — Telephone Encounter (Signed)
Patient Verify DOB  Patient was aware and understood her results

## 2016-12-23 NOTE — Telephone Encounter (Signed)
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 -Recommend MRI of spine.  -Referral to orthopedics.

## 2016-12-27 ENCOUNTER — Telehealth: Payer: Self-pay

## 2016-12-27 NOTE — Telephone Encounter (Signed)
Pre-Authorization Confirmation Fax  Case ID [id number]  Authorization ID [id number]  Status Approve

## 2016-12-31 ENCOUNTER — Ambulatory Visit (HOSPITAL_COMMUNITY)
Admission: RE | Admit: 2016-12-31 | Discharge: 2016-12-31 | Disposition: A | Discharge status: Home / Self Care | Payer: Medicaid Other | Source: Ambulatory Visit | Attending: Family Medicine | Admitting: Family Medicine

## 2016-12-31 DIAGNOSIS — I371 Nonrheumatic pulmonary valve insufficiency: Secondary | ICD-10-CM | POA: Insufficient documentation

## 2016-12-31 DIAGNOSIS — R002 Palpitations: Secondary | ICD-10-CM | POA: Insufficient documentation

## 2016-12-31 DIAGNOSIS — R001 Bradycardia, unspecified: Secondary | ICD-10-CM

## 2016-12-31 DIAGNOSIS — Z87891 Personal history of nicotine dependence: Secondary | ICD-10-CM | POA: Insufficient documentation

## 2016-12-31 DIAGNOSIS — I361 Nonrheumatic tricuspid (valve) insufficiency: Secondary | ICD-10-CM | POA: Insufficient documentation

## 2016-12-31 DIAGNOSIS — Z87898 Personal history of other specified conditions: Secondary | ICD-10-CM | POA: Insufficient documentation

## 2016-12-31 DIAGNOSIS — I517 Cardiomegaly: Secondary | ICD-10-CM | POA: Insufficient documentation

## 2016-12-31 NOTE — Progress Notes (Signed)
  Echocardiogram 2D Echocardiogram has been performed.  Jennette Dubin 12/31/2016, 8:54 AM

## 2017-01-01 ENCOUNTER — Telehealth: Payer: Self-pay | Admitting: *Deleted

## 2017-01-01 NOTE — Telephone Encounter (Signed)
Patient verified DOB Patient is aware of ECHO being normal. Patient also provided the contact information for the ortho referral. Patient does have an appointment. Patient expressed her understanding and had no further questions at this time.

## 2017-01-01 NOTE — Telephone Encounter (Signed)
-----   Message from Alfonse Spruce, [place] sent at 12/31/2016  6:46 PM EST ----- -Echocardiogram that looks at your heart structures and how well it pumps is normal.

## 2017-02-04 ENCOUNTER — Ambulatory Visit (INDEPENDENT_AMBULATORY_CARE_PROVIDER_SITE_OTHER): Payer: Medicaid Other | Admitting: Orthopaedic Surgery

## 2017-02-04 ENCOUNTER — Encounter (INDEPENDENT_AMBULATORY_CARE_PROVIDER_SITE_OTHER): Payer: Self-pay | Admitting: Orthopaedic Surgery

## 2017-02-04 VITALS — BP 114/69 | HR 76 | Ht 62.0 in | Wt 180.0 lb

## 2017-02-04 DIAGNOSIS — M542 Cervicalgia: Secondary | ICD-10-CM

## 2017-02-04 DIAGNOSIS — G8929 Other chronic pain: Secondary | ICD-10-CM

## 2017-02-04 DIAGNOSIS — M545 Low back pain, unspecified: Secondary | ICD-10-CM

## 2017-02-04 NOTE — Progress Notes (Signed)
Office Visit Note   Patient: Kim Johns           Date of Birth: 1991/06/29           MRN: [id number] Visit Date: 02/04/2017              Requested by: Alfonse Spruce, [place], [place] [id number] PCP: Fredia Beets, FNP   Assessment & Plan: Visit Diagnoses:  1. Chronic bilateral low back pain without sciatica   2. Neck pain     Plan: Patient is neurologically intact isolated motor testing and reflexes. She has several millimeters of retrolisthesis at L5-S1. She'll continue to work on core strengthening and gave her an excised book She'll continue to work on weight loss. She has considerable stress with the single parent, working, taking business school, running a house all the same time. She can continue anti-inflammatories I can recheck her again in 6 months. Patient does have some retrolisthesis at L5-S1 on plain radiographs but has no claudication symptoms and normal neurologic exam. Further diagnostic workup is deferred  Follow-Up Instructions: Return in about 6 months (around 08/07/2017).   Orders:  No orders of the defined types were placed in this encounter.  No orders of the defined types were placed in this encounter.     Procedures: No procedures performed   Clinical Data: No additional findings.   Subjective: Chief Complaint  Patient presents with  . Lower Back - Pain    Patient presents with low back, tailbone, and right hip pain. She states that she has no known injury and that she has had the symptoms for possibly a year. She states that sometimes both legs feel weak when she stands up. She does have a tingling in her right toes sometimes and will sometimes get a pain in her right lateral foot. She denies pain down her leg. Her PCP has had plain film x-rays made of her lumbar spine. They told her that we would take care of ordering a MRI if needed. She takes ibuprofen 800mg  as needed for pain. It sometimes provides relief.    Previous CT scan thoracic and CT scan cervical in March 2015 which were normal.  Review of Systems 14 point review of systems performed. She has a 28-year-old daughter . Patient works as a Merchandiser, retail. Past history of the therapy exercise programs for her back when she was a teenager. She has lost more than 25 pounds since birth for child. Current weight 180. Positive history for anxiety attacks  And asthma. She denies associated bowel bladder symptoms. Positive history for PID. Patient had plain radiographs that showed the 4 mm retrolisthesis at L5-S1 without pars defects. No disc space narrowing was noted. Patient's been treated with ibuprofen 800 mg by mouth twice a day. She still been doing cardio activities but avoiding weight since she is concerned might bother her back symptoms.  Objective: Vital Signs: BP 114/69   Pulse 76   Ht 5\' 2"  (1.575 m)   Wt 180 lb (81.6 kg)   BMI 32.92 kg/m   Physical Exam  Constitutional: She is oriented to person, place, and time. She appears well-developed.  HENT:  Head: Normocephalic.  Right Ear: External ear normal.  Left Ear: External ear normal.  Eyes: Pupils are equal, round, and reactive to light.  Neck: No tracheal deviation present. No thyromegaly present.  Cardiovascular: Normal rate.   Pulmonary/Chest: Effort normal.  Abdominal: Soft.  Musculoskeletal:  Patient is able  to heel and toe walk complaints of the dorsal ankle pain with toe walking. Negative straight leg raising 90 to pain with hip range of motion. Knee and ankle jerk are 2+ and symmetrical. ME reflexes are 2+ and symmetrical. EHL anterior tib is strong with manual resisted testing. No rash or supposed skin no pitting edema.  Neurological: She is alert and oriented to person, place, and time.  Skin: Skin is warm and dry.  Psychiatric: She has a normal mood and affect. Her behavior is normal.    Ortho Exam Estel pulses are intact. Normal reflexes and normal sensory  testing.  Specialty Comments:  No specialty comments available.  Imaging: No results found.   PMFS History: Patient Active Problem List   Diagnosis Date Noted  . Anxiety 12/17/2016  . 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 Infection associated with intrauterine device (IUD) ([place]) 10/25/2015  . TOA (tubo-ovarian abscess) 10/25/2015  . PID (acute pelvic inflammatory disease) 10/24/2015   Past Medical History:  Diagnosis Date  . Abscess of breast   . Allergy   . Anxiety   . Asthma    albuterol last used a few months ago  . HSV-2 (herpes simplex virus 2) infection   . Preterm labor     Family History  Problem Relation Age of Onset  . Diabetes Mother   . Stroke Mother   . Hypertension Father     Past Surgical History:  Procedure Laterality Date  . CERVICAL CERCLAGE N/A 04/05/2015   Procedure: CERCLAGE CERVICAL;  Surgeon: Lavonia Drafts, MD;  Location: [facility];  Service: Gynecology;  Laterality: N/A;  . WISDOM TOOTH EXTRACTION     Social History   Occupational History  . Not on file.   Social History Main Topics  . Smoking status: Former Smoker    Packs/day: 0.50    Years: 10.00    Types: Cigarettes  . Smokeless tobacco: Never Used  . Alcohol use No  . Drug use: No  . Sexual activity: Yes    Birth control/ protection: Pill

## 2017-02-08 IMAGING — US US OB DETAIL+14 WK
1 series · 12 of 28 positions shown · non-contrast
Comparison: none

[Series 1: us ob detail+14 wk · 0.14mm/px · 12 of 87 slices shown]
[im 4/87]
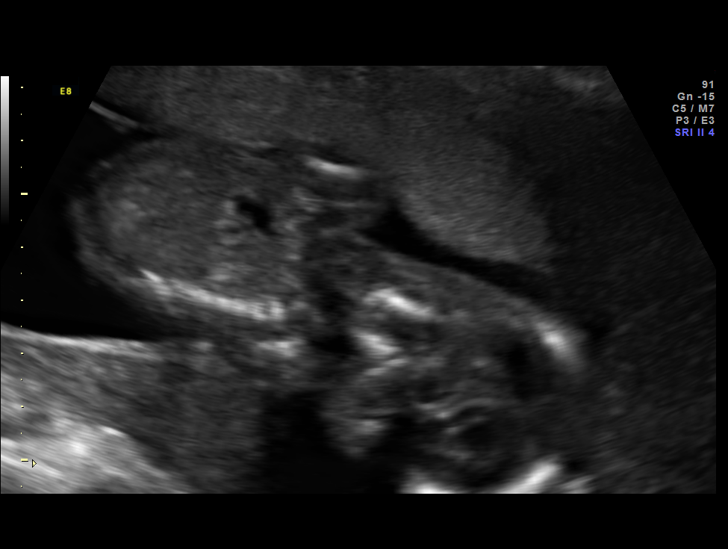
[im 10/87]
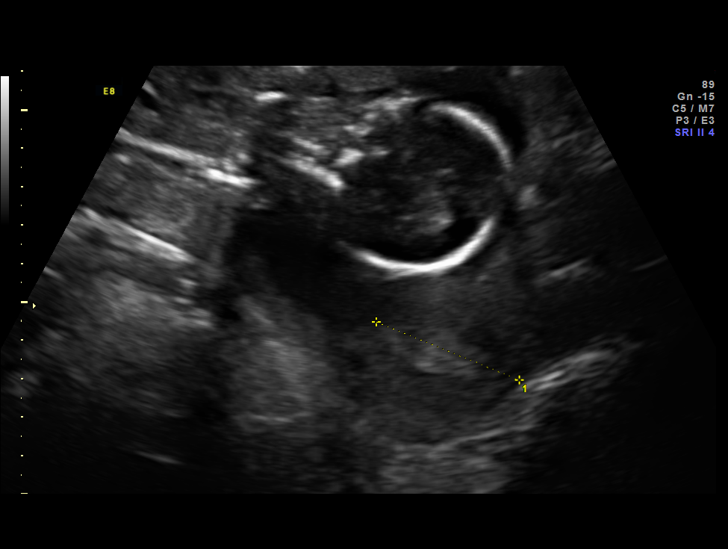
[im 16/87]
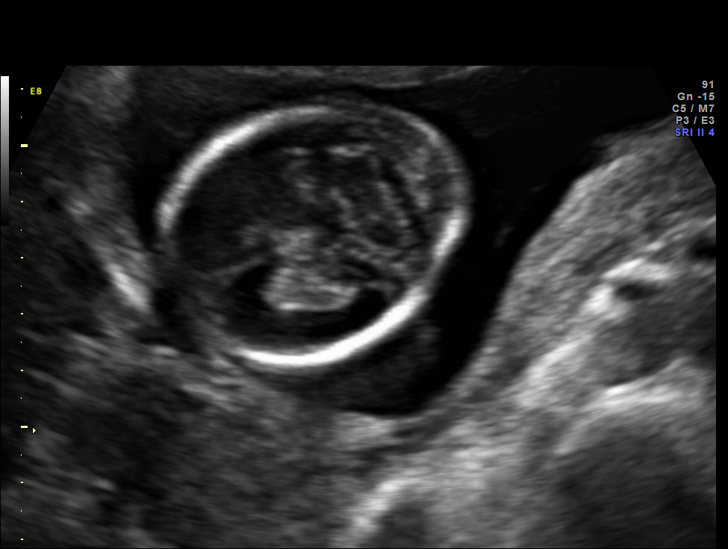
[im 26/87]
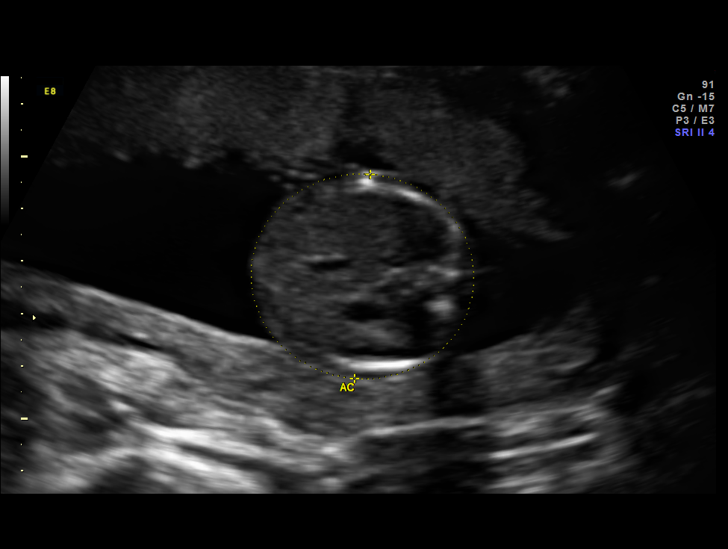
[im 32/87]
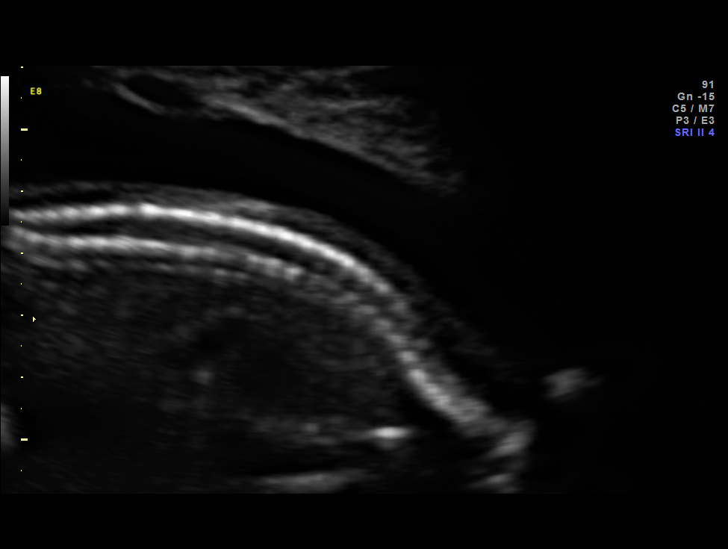
[im 39/87]
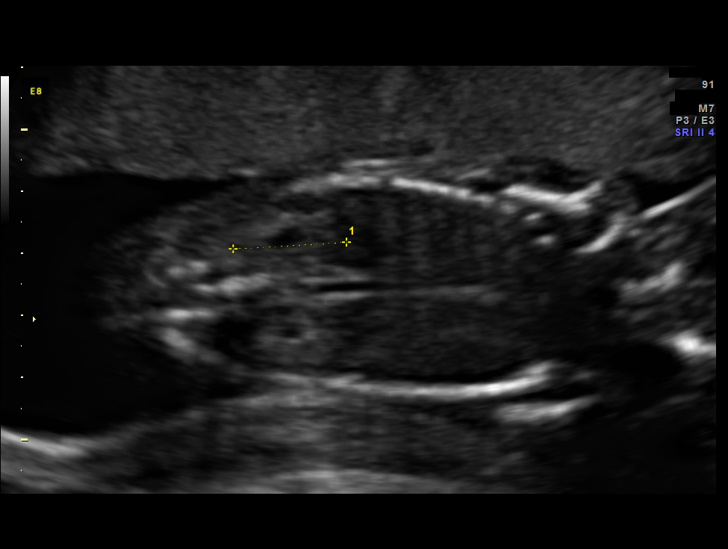
[im 48/87]
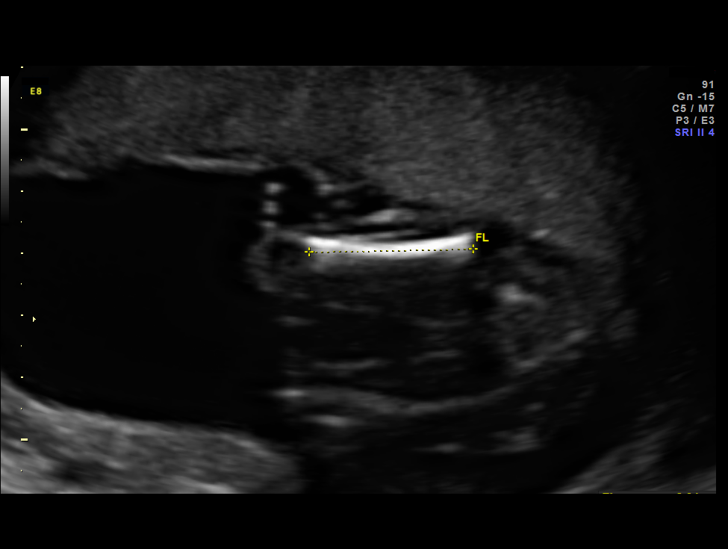
[im 55/87]
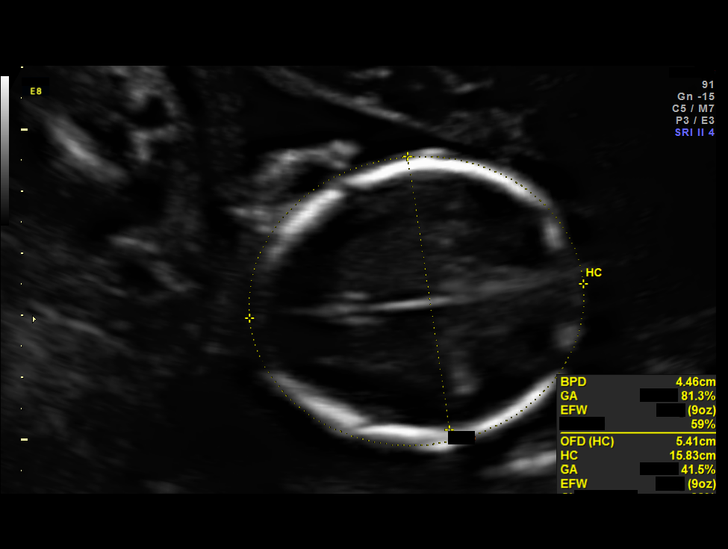
[im 61/87]
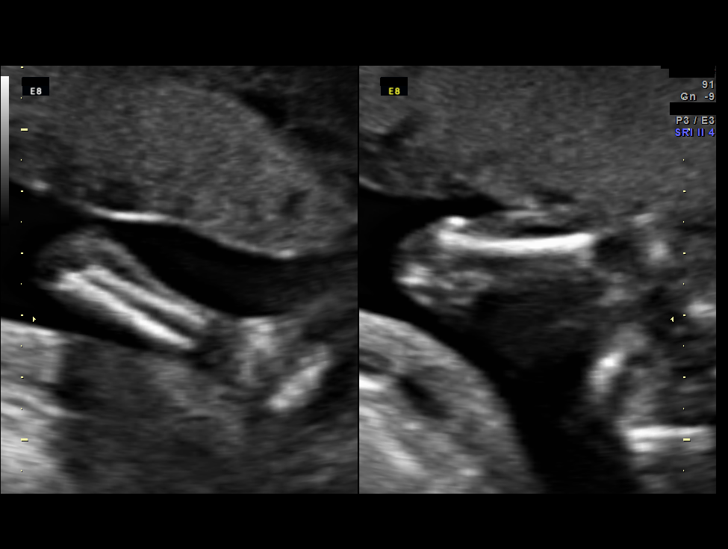
[im 71/87]
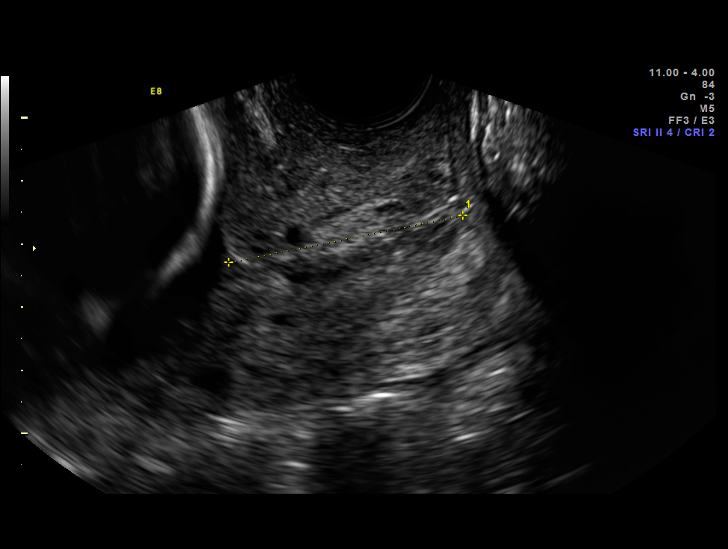
[im 77/87]
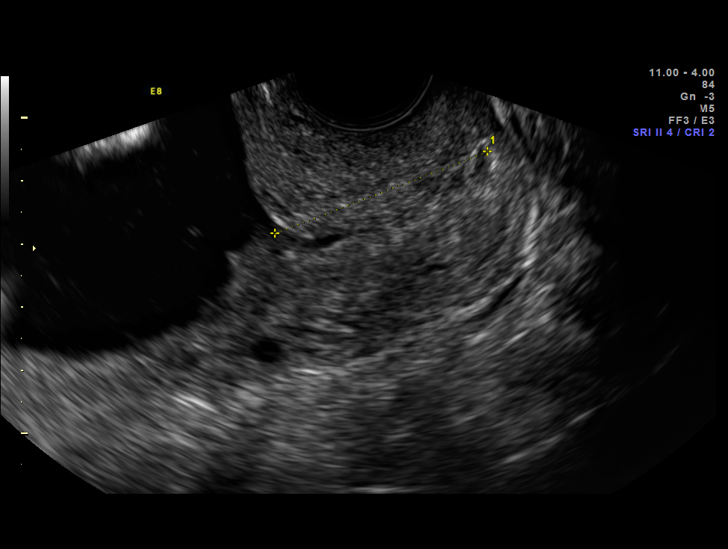
[im 83/87]
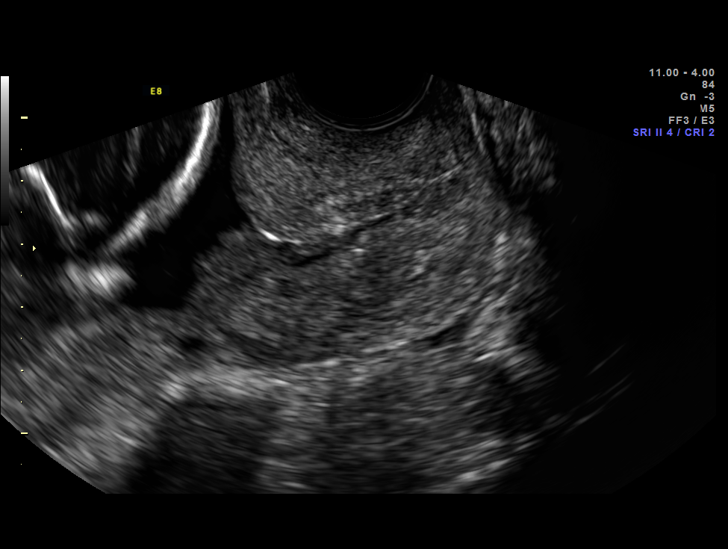

[12 of 28 positions shown; findings below may reference images not displayed]

OBSTETRICS REPORT
(Signed Final 03/20/2015 [DATE])

Service(s) Provided

US MFM OB TRANSVAGINAL                                76817.2
US OB DETAIL + 14 WK                                  76811.0
Indications

Poor obstetric history: Previous preterm delivery
(20 weeks, PPROM followed by IUFD)
18 weeks gestation of pregnancy
Fetal Evaluation

Num Of Fetuses:    1
Fetal Heart Rate:  142                          bpm
Cardiac Activity:  Observed
Presentation:      Cephalic
Placenta:          Anterior, above cervical os
P. Cord            Visualized, central
Insertion:

Amniotic Fluid
AFI FV:      Subjectively within normal limits
Larg Pckt:     4.8  cm
Biometry

BPD:     43.9  mm     G. Age:  19w 2d                CI:         81.0   70 - 86
OFD:     54.2  mm                                    FL/HC:      17.7   16.1 -
18.3
HC:     157.3  mm     G. Age:  18w 5d       39  %    HC/AC:      1.20   1.09 -
1.39
AC:     131.3  mm     G. Age:  18w 4d       44  %    FL/BPD:
FL:      27.8  mm     G. Age:  18w 4d       37  %    FL/AC:      21.2   20 - 24
HUM:     28.5  mm     G. Age:  19w 1d       65  %

Est. FW:     248  gm      0 lb 9 oz     43  %
Gestational Age

LMP:           18w 5d        Date:  11/09/14                 EDD:   08/16/15
U/S Today:     18w 5d                                        EDD:   08/16/15
Best:          18w 5d     Det. By:  LMP  (11/09/14)          EDD:   08/16/15
Anatomy

Cranium:          Appears normal         Aortic Arch:      Not well visualized
Fetal Cavum:      Appears normal         Ductal Arch:      Not well visualized
Ventricles:       Appears normal         Diaphragm:        Appears normal
Choroid Plexus:   Appears normal         Stomach:          Appears normal, left
sided
Cerebellum:       Appears normal         Abdomen:          Appears normal
Posterior Fossa:  Appears normal         Abdominal Wall:   Appears nml (cord
insert, abd wall)
Nuchal Fold:      Appears normal         Cord Vessels:     Appears normal (3
vessel cord)
Face:             Orbits nl; profile not Kidneys:          Appear normal
well visualized
Lips:             Not well visualized    Bladder:          Appears normal
Heart:            Appears normal         Spine:            Appears normal
(4CH, axis, and
situs)
RVOT:             Not well visualized    Lower             Appears normal
Extremities:
LVOT:             Appears normal         Upper             Appears normal
Extremities:

Other:  Fetus appears to be a male. Heels visualized. Technically difficult due
to fetal position.
Cervix Uterus Adnexa

Cervical Length:    3        cm

Cervix:       Normal appearance by transvaginal scan
Impression

Single IUP at 18w 5d
Hx of 20 week PROM and subsequent delivery- currently on
17P injections
Normal fetal anatomic survey
Somewhat limited views of the fetal heart were obtained
(RVOT, ductal and aortic arch)
No markers associated with aneuploidy were noted
Anterior placenta without previa

TVUS - cervical length 3 cm.  No funneling or dynamic
changes noted.  Some cervical debris is noted.
Recommendations

Recommend follow-up ultrasound examination in 2 weeks for
cervical length
Ultrasound in 4 weeks to complete anatomy and for cervical
length

questions or concerns.

## 2017-02-27 ENCOUNTER — Telehealth: Payer: Self-pay | Admitting: Family Medicine

## 2017-02-27 ENCOUNTER — Other Ambulatory Visit: Payer: Self-pay | Admitting: Family Medicine

## 2017-02-27 DIAGNOSIS — M4310 Spondylolisthesis, site unspecified: Secondary | ICD-10-CM

## 2017-02-27 NOTE — Telephone Encounter (Signed)
Patient states she was supposed to have an MRI done to look at her spine. Patient is following up on whether the order has been placed. Please advice?

## 2017-02-27 NOTE — Telephone Encounter (Signed)
Order for MRI of spine was placed on 12/19/2016. Appointment for MRI should have been made. Please follow up.

## 2017-02-27 NOTE — Telephone Encounter (Signed)
Patient states she was supposed to have an MRI done to look at her spine. Patient is following up on whether the order has been placed.

## 2017-02-27 NOTE — Telephone Encounter (Signed)
I checked & the order is already closed.

## 2017-02-27 NOTE — Telephone Encounter (Signed)
CMA call patient to get a specific date & time to scheduled her MRI Appt  Patient did not answer but left a detailed message stating to call so she can leave date & time for her MRI appt & if she cant get in touch with me she can leave the date & time to who she speak with at the front desk

## 2017-02-27 NOTE — Telephone Encounter (Signed)
Order was placed on 12/19/2016. Please inform patient that order was previously placed. Will re-order. Please schedule appointment.

## 2017-03-03 ENCOUNTER — Ambulatory Visit: Payer: Medicaid Other | Attending: Family Medicine | Admitting: Family Medicine

## 2017-03-03 VITALS — BP 127/87 | HR 61 | Temp 98.2°F | Resp 18 | Ht 63.0 in | Wt 180.2 lb

## 2017-03-03 DIAGNOSIS — L02221 Furuncle of abdominal wall: Secondary | ICD-10-CM | POA: Insufficient documentation

## 2017-03-03 DIAGNOSIS — L0293 Carbuncle, unspecified: Secondary | ICD-10-CM

## 2017-03-03 DIAGNOSIS — G8929 Other chronic pain: Secondary | ICD-10-CM

## 2017-03-03 DIAGNOSIS — Z79899 Other long term (current) drug therapy: Secondary | ICD-10-CM | POA: Insufficient documentation

## 2017-03-03 DIAGNOSIS — M5441 Lumbago with sciatica, right side: Secondary | ICD-10-CM

## 2017-03-03 DIAGNOSIS — M722 Plantar fascial fibromatosis: Secondary | ICD-10-CM

## 2017-03-03 MED ORDER — IBUPROFEN 800 MG PO TABS: 800.0000 mg | ORAL_TABLET | Freq: Three times a day (TID) | ORAL | 0 refills | Status: DC | PRN

## 2017-03-03 MED FILL — IBUPROFEN 800 MG TABLET: 800 | 13 days supply | Qty: 40 | Fill #0

## 2017-03-03 NOTE — Progress Notes (Signed)
Patient is here for f/up orthopedics  Patient stated she been having boils all over body   Patient complains about lower & upper back pain

## 2017-03-03 NOTE — Patient Instructions (Addendum)
Plantar Fasciitis Plantar fasciitis is a painful foot condition that affects the heel. It occurs when the band of tissue that connects the toes to the heel bone (plantar fascia) becomes irritated. This can happen after exercising too much or doing other repetitive activities (overuse injury). The pain from plantar fasciitis can range from mild irritation to severe pain that makes it difficult for you to walk or move. The pain is usually worse in the morning or after you have been sitting or lying down for a while. What are the causes? This condition may be caused by:  Standing for long periods of time.  Wearing shoes that do not fit.  Doing high-impact activities, including running, aerobics, and ballet.  Being overweight.  Having an abnormal way of walking (gait).  Having tight calf muscles.  Having high arches in your feet.  Starting a new athletic activity. What are the signs or symptoms? The main symptom of this condition is heel pain. Other symptoms include:  Pain that gets worse after activity or exercise.  Pain that is worse in the morning or after resting.  Pain that goes away after you walk for a few minutes. How is this diagnosed? This condition may be diagnosed based on your signs and symptoms. Your health care provider will also do a physical exam to check for:  A tender area on the bottom of your foot.  A high arch in your foot.  Pain when you move your foot.  Difficulty moving your foot. You may also need to have imaging studies to confirm the diagnosis. These can include:  X-rays.  Ultrasound.  MRI. How is this treated? Treatment for plantar fasciitis depends on the severity of the condition. Your treatment may include:  Rest, ice, and over-the-counter pain medicines to manage your pain.  Exercises to stretch your calves and your plantar fascia.  A splint that holds your foot in a stretched, upward position while you sleep (night splint).  Physical  therapy to relieve symptoms and prevent problems in the future.  Cortisone injections to relieve severe pain.  Extracorporeal shock wave therapy (ESWT) to stimulate damaged plantar fascia with electrical impulses. It is often used as a last resort before surgery.  Surgery, if other treatments have not worked after 12 months. Follow these instructions at home:  Take medicines only as directed by your health care provider.  Avoid activities that cause pain.  Roll the bottom of your foot over a bag of ice or a bottle of cold water. Do this for 20 minutes, 3-4 times a day.  Perform simple stretches as directed by your health care provider.  Try wearing athletic shoes with air-sole or gel-sole cushions or soft shoe inserts.  Wear a night splint while sleeping, if directed by your health care provider.  Keep all follow-up appointments with your health care provider. How is this prevented?  Do not perform exercises or activities that cause heel pain.  Consider finding low-impact activities if you continue to have problems.  Lose weight if you need to. The best way to prevent plantar fasciitis is to avoid the activities that aggravate your plantar fascia. Contact a health care provider if:  Your symptoms do not go away after treatment with home care measures.  Your pain gets worse.  Your pain affects your ability to move or do your daily activities. This information is not intended to replace advice given to you by your health care provider. Make sure you discuss any questions you have   with your health care provider. Document Released: 08/06/2001 Document Revised: 04/15/2016 Document Reviewed: 09/21/2014 Elsevier Interactive Patient Education  2017 [employer].   Back Exercises If you have pain in your back, do these exercises 2-3 times each day or as told by your doctor. When the pain goes away, do the exercises once each day, but repeat the steps more times for each exercise  (do more repetitions). If you do not have pain in your back, do these exercises once each day or as told by your doctor. Exercises Single Knee to Chest   Do these steps 3-5 times in a row for each leg: 1. Lie on your back on a firm bed or the floor with your legs stretched out. 2. Bring one knee to your chest. 3. Hold your knee to your chest by grabbing your knee or thigh. 4. Pull on your knee until you feel a gentle stretch in your lower back. 5. Keep doing the stretch for 10-30 seconds. 6. Slowly let go of your leg and straighten it. Pelvic Tilt   Do these steps 5-10 times in a row: 1. Lie on your back on a firm bed or the floor with your legs stretched out. 2. Bend your knees so they point up to the ceiling. Your feet should be flat on the floor. 3. Tighten your lower belly (abdomen) muscles to press your lower back against the floor. This will make your tailbone point up to the ceiling instead of pointing down to your feet or the floor. 4. Stay in this position for 5-10 seconds while you gently tighten your muscles and breathe evenly. Cat-Cow   Do these steps until your lower back bends more easily: 1. Get on your hands and knees on a firm surface. Keep your hands under your shoulders, and keep your knees under your hips. You may put padding under your knees. 2. Let your head hang down, and make your tailbone point down to the floor so your lower back is round like the back of a cat. 3. Stay in this position for 5 seconds. 4. Slowly lift your head and make your tailbone point up to the ceiling so your back hangs low (sags) like the back of a cow. 5. Stay in this position for 5 seconds. Press-Ups   Do these steps 5-10 times in a row: 1. Lie on your belly (face-down) on the floor. 2. Place your hands near your head, about shoulder-width apart. 3. While you keep your back relaxed and keep your hips on the floor, slowly straighten your arms to raise the top half of your body and lift  your shoulders. Do not use your back muscles. To make yourself more comfortable, you may change where you place your hands. 4. Stay in this position for 5 seconds. 5. Slowly return to lying flat on the floor. Bridges   Do these steps 10 times in a row: 1. Lie on your back on a firm surface. 2. Bend your knees so they point up to the ceiling. Your feet should be flat on the floor. 3. Tighten your butt muscles and lift your butt off of the floor until your waist is almost as high as your knees. If you do not feel the muscles working in your butt and the back of your thighs, slide your feet 1-2 inches farther away from your butt. 4. Stay in this position for 3-5 seconds. 5. Slowly lower your butt to the floor, and let your butt muscles relax.  If this exercise is too easy, try doing it with your arms crossed over your chest. Belly Crunches   Do these steps 5-10 times in a row: 1. Lie on your back on a firm bed or the floor with your legs stretched out. 2. Bend your knees so they point up to the ceiling. Your feet should be flat on the floor. 3. Cross your arms over your chest. 4. Tip your chin a little bit toward your chest but do not bend your neck. 5. Tighten your belly muscles and slowly raise your chest just enough to lift your shoulder blades a tiny bit off of the floor. 6. Slowly lower your chest and your head to the floor. Back Lifts  Do these steps 5-10 times in a row: 1. Lie on your belly (face-down) with your arms at your sides, and rest your forehead on the floor. 2. Tighten the muscles in your legs and your butt. 3. Slowly lift your chest off of the floor while you keep your hips on the floor. Keep the back of your head in line with the curve in your back. Look at the floor while you do this. 4. Stay in this position for 3-5 seconds. 5. Slowly lower your chest and your face to the floor. Contact a doctor if:  Your back pain gets a lot worse when you do an exercise.  Your back  pain does not lessen 2 hours after you exercise. If you have any of these problems, stop doing the exercises. Do not do them again unless your doctor says it is okay. Get help right away if:  You have sudden, very bad back pain. If this happens, stop doing the exercises. Do not do them again unless your doctor says it is okay. This information is not intended to replace advice given to you by your health care provider. Make sure you discuss any questions you have with your health care provider. Document Released: 12/14/2010 Document Revised: 04/18/2016 Document Reviewed: 01/05/2015 Elsevier Interactive Patient Education  2017 [employer].

## 2017-03-03 NOTE — Progress Notes (Signed)
Subjective:  Patient ID: Kim Johns, female    DOB: 06/05/1991  Age: 26 y.o. MRN: [id number]  CC: Establish Care   HPI Kim Johns presents for  Chronic low back pain f/u : Present for several months. Pain is located in the right hip(s), is described as aching, and is intermittent . Pain is 7/10 at its worst.  Associated symptoms include sciatica to the right leg and foot. History of x-ray imaging that showed retrolisthesis of the L5-S1. Spinal MRI was recommended and ordered. She reports following up with orthopedic referral. Requesting another orthopedic specialist due to being unsatisfied with her previous experience.    Skin lesions: Reports 1 year history of recurrent boils to the bilateral arms, abdomen, bilateral thighs, and groin. Reports last episode was 2 weeks ago. Reports purulent drainage to abdominal boil two days ago. Denies any drainage, open areas, or fevers at this time. She reports taking oral antibiotic in the past for symptoms and  OTC antibacterial skin washes with minimal relief of symptoms.    Right foot pain: Reports stiffness and pain to right lower foot. Denies any history of injury or swelling. She reports stiffness and pain is worst when she first wakes up in the morning and bears weight. Symptoms include pulling sensation.Reports using gel shoe insoles and OTC ibuprofen with minimal relief of symptoms.     Outpatient Medications Prior to Visit  Medication Sig Dispense Refill  . albuterol (PROAIR HFA) 108 (90 Base) MCG/ACT inhaler Inhale 2 puffs into the lungs every 6 (six) hours as needed for wheezing or shortness of breath. (Patient not taking: Reported on 02/04/2017) 1 Inhaler 2  . cephALEXin (KEFLEX) 500 MG capsule Take 1 capsule (500 mg total) by mouth 4 (four) times daily. (Patient not taking: Reported on 02/04/2017) 28 capsule 0  . doxycycline (VIBRAMYCIN) 100 MG capsule Take 1 capsule (100 mg total) by mouth 2 (two) times daily. (Patient not taking:  Reported on 02/04/2017) 28 capsule 0  . fluconazole (DIFLUCAN) 150 MG tablet Take 1 tablet (150 mg total) by mouth daily. (Patient not taking: Reported on 02/04/2017) 1 tablet 1  . HYDROcodone-acetaminophen (NORCO/VICODIN) 5-325 MG tablet Take 1-2 tablets by mouth every 6 hours as needed for pain and/or cough. (Patient not taking: Reported on 02/04/2017) 7 tablet 0  . oseltamivir (TAMIFLU) 75 MG capsule Take 1 capsule (75 mg total) by mouth every 12 (twelve) hours. (Patient not taking: Reported on 02/04/2017) 10 capsule 0  . SPRINTEC 28 0.25-35 MG-MCG tablet TAKE 1 TABLET BY MOUTH EVERY DAY (ONLY TAKE ACTIVE PILLS CONTINUOUSLY) 84 tablet 5  . valACYclovir (VALTREX) 1000 MG tablet Take 1 tablet (1,000 mg total) by mouth 2 (two) times daily. (Patient not taking: Reported on 02/04/2017) 20 tablet 1  . ibuprofen (ADVIL,MOTRIN) 800 MG tablet Take 1 tablet (800 mg total) by mouth every 8 (eight) hours as needed for mild pain, moderate pain or cramping. 40 tablet 0   No facility-administered medications prior to visit.     ROS Review of Systems  Constitutional: Negative.   Respiratory: Negative.   Cardiovascular: Negative.   Gastrointestinal: Negative.   Musculoskeletal: Positive for back pain and myalgias.  Skin:       boils    Objective:  BP 127/87 (BP Location: Left Arm, Patient Position: Sitting, Cuff Size: Normal)   Pulse 61   Temp 98.2 F (36.8 C) (Oral)   Resp 18   Ht 5\' 3"  (1.6 m)   Wt 180 lb 3.2 oz (81.7  kg)   SpO2 100%   BMI 31.92 kg/m   BP/Weight 03/03/2017 02/04/2017 0/86/7619  Systolic BP 509 326 712  Diastolic BP 87 69 67  Wt. (Lbs) 180.2 180 186.8  BMI 31.92 32.92 32.06    Physical Exam  Cardiovascular: Normal rate, regular rhythm, normal heart sounds and intact distal pulses.   Pulmonary/Chest: Effort normal and breath sounds normal.  Abdominal: Soft. Bowel sounds are normal.  Musculoskeletal:       Lumbar back: She exhibits pain.  Bilateral feet: Pain with flexion.    Neurological: She has normal reflexes.  Skin: Skin is warm and dry.  Carbuncle present to LLQ. No drainage, redness, or open areas present.   Nursing note and vitals reviewed.   Assessment & Plan:   Problem List Items Addressed This Visit    None    Visit Diagnoses    Chronic right-sided low back pain with right-sided sciatica    -  Primary   Relevant Medications   ibuprofen (ADVIL,MOTRIN) 800 MG tablet   Other Relevant Orders   Ambulatory referral to Orthopedics   Recurrent boils       -No open wounds or drainage available to perform wound culture.    -Do to chronic on-going problem despite interventions recommend dermatology referral.   -Pt. encouraged to continue to use antibacterial skin washes and to change shave products frequently.    Relevant Orders   Ambulatory referral to Dermatology   Plantar fasciitis of right foot       -Encourage use of better shoe insoles, avoid wearing high heels.    -Use of cold therapy to the area to decreased pain/inflammation.   Relevant Medications   ibuprofen (ADVIL,MOTRIN) 800 MG tablet      Meds ordered this encounter  Medications  . ibuprofen (ADVIL,MOTRIN) 800 MG tablet    Sig: Take 1 tablet (800 mg total) by mouth every 8 (eight) hours as needed for mild pain, moderate pain or cramping (Take with food.).    Dispense:  40 tablet    Refill:  0    Order Specific Question:   Supervising Provider    Answer:   Tresa Garter [id number]    Follow-up: Return As needed. Alfonse Spruce FNP

## 2017-03-18 ENCOUNTER — Ambulatory Visit (HOSPITAL_COMMUNITY)
Admission: RE | Admit: 2017-03-18 | Discharge: 2017-03-18 | Disposition: A | Discharge status: Home / Self Care | Payer: Medicaid Other | Source: Ambulatory Visit | Attending: Family Medicine | Admitting: Family Medicine

## 2017-03-18 DIAGNOSIS — M4310 Spondylolisthesis, site unspecified: Secondary | ICD-10-CM | POA: Insufficient documentation

## 2017-03-18 DIAGNOSIS — M5126 Other intervertebral disc displacement, lumbar region: Secondary | ICD-10-CM | POA: Insufficient documentation

## 2017-03-19 ENCOUNTER — Emergency Department (HOSPITAL_COMMUNITY)
Admission: EM | Admit: 2017-03-19 | Discharge: 2017-03-19 | Disposition: A | Discharge status: Home / Self Care | Payer: Self-pay | Attending: Emergency Medicine | Admitting: Emergency Medicine

## 2017-03-19 ENCOUNTER — Encounter (HOSPITAL_COMMUNITY): Payer: Self-pay

## 2017-03-19 ENCOUNTER — Emergency Department (HOSPITAL_COMMUNITY): Payer: Self-pay

## 2017-03-19 DIAGNOSIS — Z87891 Personal history of nicotine dependence: Secondary | ICD-10-CM | POA: Insufficient documentation

## 2017-03-19 DIAGNOSIS — Z79899 Other long term (current) drug therapy: Secondary | ICD-10-CM | POA: Insufficient documentation

## 2017-03-19 DIAGNOSIS — K529 Noninfective gastroenteritis and colitis, unspecified: Secondary | ICD-10-CM | POA: Insufficient documentation

## 2017-03-19 DIAGNOSIS — J45909 Unspecified asthma, uncomplicated: Secondary | ICD-10-CM | POA: Insufficient documentation

## 2017-03-19 LAB — COMPREHENSIVE METABOLIC PANEL
ALK PHOS: 27 U/L — AB (ref 38–126)
ALT: 16 U/L (ref 14–54)
AST: 20 U/L (ref 15–41)
Albumin: 3.7 g/dL (ref 3.5–5.0)
Anion gap: 8 (ref 5–15)
BILIRUBIN TOTAL: 0.4 mg/dL (ref 0.3–1.2)
BUN: 8 mg/dL (ref 6–20)
CALCIUM: 9 mg/dL (ref 8.9–10.3)
CO2: 24 mmol/L (ref 22–32)
CREATININE: 0.72 mg/dL (ref 0.44–1.00)
Chloride: 105 mmol/L (ref 101–111)
Glucose, Bld: 90 mg/dL (ref 65–99)
Potassium: 4 mmol/L (ref 3.5–5.1)
Sodium: 137 mmol/L (ref 135–145)
TOTAL PROTEIN: 7 g/dL (ref 6.5–8.1)

## 2017-03-19 LAB — LIPASE, BLOOD: LIPASE: 22 U/L (ref 11–51)

## 2017-03-19 LAB — CBC
HCT: 39.1 % (ref 36.0–46.0)
Hemoglobin: 13.6 g/dL (ref 12.0–15.0)
MCH: 32.5 pg (ref 26.0–34.0)
MCHC: 34.8 g/dL (ref 30.0–36.0)
MCV: 93.5 fL (ref 78.0–100.0)
PLATELETS: 289 10*3/uL (ref 150–400)
RBC: 4.18 MIL/uL (ref 3.87–5.11)
RDW: 12.5 % (ref 11.5–15.5)
WBC: 6.3 10*3/uL (ref 4.0–10.5)

## 2017-03-19 LAB — I-STAT BETA HCG BLOOD, ED (MC, WL, AP ONLY)

## 2017-03-19 MED ORDER — ONDANSETRON 8 MG PO TBDP: ORAL_TABLET | ORAL | 0 refills | Status: DC

## 2017-03-19 MED ORDER — KETOROLAC TROMETHAMINE 30 MG/ML IJ SOLN
30.0000 mg | Freq: Once | INTRAMUSCULAR | Status: AC
Start: 2017-03-19 — End: 2017-03-19
  Administered 2017-03-19: 30 mg via INTRAVENOUS
  Filled 2017-03-19: qty 1

## 2017-03-19 MED ORDER — SODIUM CHLORIDE 0.9 % IV BOLUS (SEPSIS)
1000.0000 mL | Freq: Once | INTRAVENOUS | Status: AC
  Administered 2017-03-19: 1000 mL via INTRAVENOUS

## 2017-03-19 MED ORDER — MORPHINE SULFATE (PF) 4 MG/ML IV SOLN
4.0000 mg | Freq: Once | INTRAVENOUS | Status: AC
  Administered 2017-03-19: 4 mg via INTRAVENOUS
  Filled 2017-03-19: qty 1

## 2017-03-19 MED ORDER — ONDANSETRON HCL 4 MG/2ML IJ SOLN
4.0000 mg | Freq: Once | INTRAMUSCULAR | Status: AC
  Administered 2017-03-19: 4 mg via INTRAVENOUS
  Filled 2017-03-19: qty 2

## 2017-03-19 MED ORDER — IOPAMIDOL (ISOVUE-300) INJECTION 61%
INTRAVENOUS | Status: AC
  Administered 2017-03-19: 100 mL
  Filled 2017-03-19: qty 100

## 2017-03-19 NOTE — ED Provider Notes (Signed)
[facility] DEPT Provider Note   CSN: [id number] Arrival date & time: 03/19/17  0244  By signing my name below, I, Jeanell Sparrow, attest that this documentation has been prepared under the direction and in the presence of Veryl Speak, MD. Electronically Signed: Jeanell Sparrow, Scribe. 03/19/2017. 3:41 AM.   History   Chief Complaint Chief Complaint  Patient presents with  . Abdominal Pain  . Emesis  . Diarrhea   The history is provided by the patient. No language interpreter was used.  Abdominal Pain   This is a new problem. The current episode started yesterday. The problem occurs constantly. The problem has not changed since onset.The pain is located in the generalized abdominal region. Associated symptoms include nausea and vomiting. Pertinent negatives include hematochezia and hematuria. Nothing aggravates the symptoms. Nothing relieves the symptoms.   HPI Comments: Kim Johns is a 26 y.o. female with a PMHx of PID who presents to the Emergency Department complaining of constant moderate generalized abdominal pain that started yesterday. She reports she has been eating salad lately and suspects her symptoms are related to the recent romaine lettuce recall. She describes the pain as a burning sensation. No modifying factors. She reports associated nausea, vomiting (2 episodes), and diarrhea. Denies any sick contacts, blood in stool, hematochezia, hematuria, or other complaints at this time.    PCP: Fredia Beets, FNP  Past Medical History:  Diagnosis Date  . Abscess of breast   . Allergy   . Anxiety   . Asthma    albuterol last used a few months ago  . HSV-2 (herpes simplex virus 2) infection   . Preterm labor     Patient Active Problem List   Diagnosis Date Noted  . Anxiety 12/17/2016  . 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 Infection associated with intrauterine device (IUD) ([place]) 10/25/2015  . TOA (tubo-ovarian abscess) 10/25/2015  . PID  (acute pelvic inflammatory disease) 10/24/2015    Past Surgical History:  Procedure Laterality Date  . CERVICAL CERCLAGE N/A 04/05/2015   Procedure: CERCLAGE CERVICAL;  Surgeon: Lavonia Drafts, MD;  Location: [facility];  Service: Gynecology;  Laterality: N/A;  . WISDOM TOOTH EXTRACTION      OB History    Gravida Para Term Preterm AB Living   2 2 1 1  0 1   SAB TAB Ectopic Multiple Live Births   0 0 0 0 1       Home Medications    Prior to Admission medications   Medication Sig Start Date End Date Taking? Authorizing Provider  albuterol (PROAIR HFA) 108 (90 Base) MCG/ACT inhaler Inhale 2 puffs into the lungs every 6 (six) hours as needed for wheezing or shortness of breath. Patient not taking: Reported on 02/04/2017 12/19/16   Alfonse Spruce, FNP  cephALEXin (KEFLEX) 500 MG capsule Take 1 capsule (500 mg total) by mouth 4 (four) times daily. Patient not taking: Reported on 02/04/2017 10/31/16   Charlann Lange, PA-C  doxycycline (VIBRAMYCIN) 100 MG capsule Take 1 capsule (100 mg total) by mouth 2 (two) times daily. Patient not taking: Reported on 02/04/2017 06/14/16   Jorje Guild, NP  fluconazole (DIFLUCAN) 150 MG tablet Take 1 tablet (150 mg total) by mouth daily. Patient not taking: Reported on 02/04/2017 06/14/16   Jorje Guild, NP  HYDROcodone-acetaminophen (NORCO/VICODIN) 5-325 MG tablet Take 1-2 tablets by mouth every 6 hours as needed for pain and/or cough. Patient not taking: Reported on 02/04/2017 10/13/16   Elmyra Ricks Pisciotta, PA-C  ibuprofen (  ADVIL,MOTRIN) 800 MG tablet Take 1 tablet (800 mg total) by mouth every 8 (eight) hours as needed for mild pain, moderate pain or cramping (Take with food.). 03/03/17   Alfonse Spruce, FNP  oseltamivir (TAMIFLU) 75 MG capsule Take 1 capsule (75 mg total) by mouth every 12 (twelve) hours. Patient not taking: Reported on 02/04/2017 10/13/16   Elmyra Ricks Pisciotta, PA-C  SPRINTEC 28 0.25-35 MG-MCG tablet TAKE 1 TABLET BY MOUTH EVERY DAY  (ONLY TAKE ACTIVE PILLS CONTINUOUSLY) 08/05/16   Osborne Oman, MD  valACYclovir (VALTREX) 1000 MG tablet Take 1 tablet (1,000 mg total) by mouth 2 (two) times daily. Patient not taking: Reported on 02/04/2017 01/02/16   Jorje Guild, NP    Family History Family History  Problem Relation Age of Onset  . Diabetes Mother   . Stroke Mother   . Hypertension Father     Social History Social History  Substance Use Topics  . Smoking status: Former Smoker    Packs/day: 0.50    Years: 10.00    Types: Cigarettes  . Smokeless tobacco: Never Used  . Alcohol use 0.0 oz/week     Comment: occ     Allergies   Shellfish allergy; Sulfa antibiotics; and Icy hot   Review of Systems Review of Systems  Gastrointestinal: Positive for abdominal pain (Generalized), nausea and vomiting. Negative for blood in stool and hematochezia.  Genitourinary: Negative for hematuria.  All other systems reviewed and are negative.    Physical Exam Updated Vital Signs BP (!) 119/91 (BP Location: Right Arm)   Pulse 75   Temp 97.8 F (36.6 C) (Oral)   Resp 16   Ht 5\' 3"  (1.6 m)   Wt 178 lb (80.7 kg)   LMP 03/12/2017 (Exact Date)   SpO2 99%   Breastfeeding? No   BMI 31.53 kg/m   Physical Exam  Constitutional: She is oriented to person, place, and time. She appears well-developed and well-nourished. No distress.  HENT:  Head: Normocephalic and atraumatic.  Eyes: EOM are normal.  Neck: Normal range of motion.  Cardiovascular: Normal rate, regular rhythm and normal heart sounds.   Pulmonary/Chest: Effort normal and breath sounds normal.  Abdominal: Soft. She exhibits no distension. There is tenderness. There is no rebound and no guarding.  Mild generalized TTP.   Musculoskeletal: Normal range of motion.  Neurological: She is alert and oriented to person, place, and time.  Skin: Skin is warm and dry.  Psychiatric: She has a normal mood and affect. Judgment normal.  Nursing note and vitals  reviewed.    ED Treatments / Results  DIAGNOSTIC STUDIES: Oxygen Saturation is 99% on RA, normal by my interpretation.    COORDINATION OF CARE: 3:45 AM- Pt advised of plan for treatment and pt agrees.  Labs (all labs ordered are listed, but only abnormal results are displayed) Labs Reviewed  COMPREHENSIVE METABOLIC PANEL - Abnormal; Notable for the following:       Result Value   Alkaline Phosphatase 27 (*)    All other components within normal limits  LIPASE, BLOOD  CBC  URINALYSIS, ROUTINE W REFLEX MICROSCOPIC  I-STAT BETA HCG BLOOD, ED ([facility], [facility], [facility] ONLY)    EKG  EKG Interpretation None       Radiology Mr Lumbar Spine Wo Contrast  Result Date: 03/18/2017 CLINICAL DATA:  26 y/o F; increasing lower back pain radiating down the right leg at times. EXAM: MRI LUMBAR SPINE WITHOUT CONTRAST TECHNIQUE: Multiplanar, multisequence MR imaging of the lumbar spine  was performed. No intravenous contrast was administered. COMPARISON:  12/18/2016 lumbar radiographs FINDINGS: Segmentation:  Standard. Alignment: Slight L5-S1 retrolisthesis, less pronounced than on prior radiographs. Vertebrae:  No fracture, evidence of discitis, or bone lesion. Conus medullaris: Extends to the L1 level and appears normal. Paraspinal and other soft tissues: Negative. Disc levels: L1-2: No significant disc displacement, foraminal narrowing, or canal stenosis. L2-3: No significant disc displacement, foraminal narrowing, or canal stenosis. L3-4: No significant disc displacement, foraminal narrowing, or canal stenosis. L4-5: Small disc bulge with mild right-greater-than-left foraminal narrowing. No significant canal stenosis. L5-S1: Small disc bulge with mild bilateral foraminal narrowing. No significant canal stenosis. IMPRESSION: 1. No acute osseous abnormality. 2. Slight L5-1 retrolisthesis, less pronounced than on prior radiographs. 3. Small L4-5 and L5-S1 disc bulges with mild foraminal narrowing greater on the  right at the L4-5 level. Electronically Signed   By: Kristine Garbe M.D.   On: 03/18/2017 15:23    Procedures Procedures (including critical care time)  Medications Ordered in ED Medications  sodium chloride 0.9 % bolus 1,000 mL (not administered)  ondansetron (ZOFRAN) injection 4 mg (not administered)  ketorolac (TORADOL) 30 MG/ML injection 30 mg (not administered)     Initial Impression / Assessment and Plan / ED Course  I have reviewed the triage vital signs and the nursing notes.  Pertinent labs & imaging results that were available during my care of the patient were reviewed by me and considered in my medical decision making (see chart for details).  Patient presents here with nausea, vomiting, diarrhea, and abdominal cramping. She was given IV fluids, Toradol, and Zofran, however continues to complain of severe pain. For this reason, a CT scan was obtained showing no acute intra-abdominal process. I highly suspect a viral etiology. She will be discharged with anti-emetics and when necessary follow-up/return.  Final Clinical Impressions(s) / ED Diagnoses   Final diagnoses:  None    New Prescriptions New Prescriptions   No medications on file   I personally performed the services described in this documentation, which was scribed in my presence. The recorded information has been reviewed and is accurate.        Veryl Speak, MD 03/19/17 [phone]

## 2017-03-19 NOTE — ED Triage Notes (Signed)
Pt endorses generalized abd pain with n/v/d since yesterday morning. VSS.

## 2017-03-19 NOTE — ED Notes (Signed)
Pt.dress in [place] and on the monitor

## 2017-03-19 NOTE — Telephone Encounter (Signed)
Closing open note from 2015

## 2017-03-19 NOTE — Discharge Instructions (Signed)
Zofran as needed for nausea.  Ibuprofen 600 mg every 6 hours as needed for pain.  Return to the emergency department if you develop worsening pain, high fevers, bloody stools, or other new and concerning symptoms.

## 2017-03-25 ENCOUNTER — Ambulatory Visit: Payer: Self-pay | Attending: Internal Medicine | Admitting: Internal Medicine

## 2017-03-25 ENCOUNTER — Other Ambulatory Visit (HOSPITAL_COMMUNITY)
Admission: RE | Admit: 2017-03-25 | Discharge: 2017-03-25 | Disposition: A | Discharge status: Home / Self Care | Payer: Medicaid Other | Source: Ambulatory Visit | Attending: Internal Medicine | Admitting: Internal Medicine

## 2017-03-25 ENCOUNTER — Encounter: Payer: Self-pay | Admitting: Internal Medicine

## 2017-03-25 ENCOUNTER — Telehealth: Payer: Self-pay

## 2017-03-25 DIAGNOSIS — Z888 Allergy status to other drugs, medicaments and biological substances status: Secondary | ICD-10-CM | POA: Insufficient documentation

## 2017-03-25 DIAGNOSIS — R350 Frequency of micturition: Secondary | ICD-10-CM | POA: Insufficient documentation

## 2017-03-25 DIAGNOSIS — Z91013 Allergy to seafood: Secondary | ICD-10-CM | POA: Insufficient documentation

## 2017-03-25 DIAGNOSIS — Z882 Allergy status to sulfonamides status: Secondary | ICD-10-CM | POA: Insufficient documentation

## 2017-03-25 DIAGNOSIS — J45909 Unspecified asthma, uncomplicated: Secondary | ICD-10-CM | POA: Insufficient documentation

## 2017-03-25 DIAGNOSIS — Z8619 Personal history of other infectious and parasitic diseases: Secondary | ICD-10-CM | POA: Insufficient documentation

## 2017-03-25 DIAGNOSIS — M791 Myalgia: Secondary | ICD-10-CM | POA: Insufficient documentation

## 2017-03-25 DIAGNOSIS — Z8751 Personal history of pre-term labor: Secondary | ICD-10-CM | POA: Insufficient documentation

## 2017-03-25 LAB — POCT URINALYSIS DIPSTICK
Blood, UA: NEGATIVE
GLUCOSE UA: NEGATIVE
LEUKOCYTES UA: NEGATIVE
NITRITE UA: NEGATIVE
PH UA: 6 (ref 5.0–8.0)
Protein, UA: 30
Spec Grav, UA: >=1.03 — AB (ref 1.010–1.025)
Urobilinogen, UA: 1 E.U./dL

## 2017-03-25 NOTE — Telephone Encounter (Signed)
Writer was able to give patient MRI results of her spine per [facility]. Patient also c/o UTI symptoms today.  Patient scheduled to see Dr. Janne Napoleon at 3:00 Patient will schedule an appt when she arrives today with [facility].  She has an ortho and derm referral that have been place.

## 2017-03-25 NOTE — Patient Instructions (Addendum)
Drink cranberry juice, helps prevent urinary tract infections!  Take care.  Health Maintenance, Female Adopting a healthy lifestyle and getting preventive care can go a long way to promote health and wellness. Talk with your health care provider about what schedule of regular examinations is right for you. This is a good chance for you to check in with your provider about disease prevention and staying healthy. In between checkups, there are plenty of things you can do on your own. Experts have done a lot of research about which lifestyle changes and preventive measures are most likely to keep you healthy. Ask your health care provider for more information. Weight and diet Eat a healthy diet  Be sure to include plenty of vegetables, fruits, low-fat dairy products, and lean protein.  Do not eat a lot of foods high in solid fats, added sugars, or salt.  Get regular exercise. This is one of the most important things you can do for your health.  Most adults should exercise for at least 150 minutes each week. The exercise should increase your heart rate and make you sweat (moderate-intensity exercise).  Most adults should also do strengthening exercises at least twice a week. This is in addition to the moderate-intensity exercise. Maintain a healthy weight  Body mass index (BMI) is a measurement that can be used to identify possible weight problems. It estimates body fat based on height and weight. Your health care provider can help determine your BMI and help you achieve or maintain a healthy weight.  For females 37 years of age and older:  A BMI below 18.5 is considered underweight.  A BMI of 18.5 to 24.9 is normal.  A BMI of 25 to 29.9 is considered overweight.  A BMI of 30 and above is considered obese. Watch levels of cholesterol and blood lipids  You should start having your blood tested for lipids and cholesterol at 27 years of age, then have this test every 5 years.  You may  need to have your cholesterol levels checked more often if:  Your lipid or cholesterol levels are high.  You are older than 26 years of age.  You are at high risk for heart disease. Cancer screening Lung Cancer  Lung cancer screening is recommended for adults 49-65 years old who are at high risk for lung cancer because of a history of smoking.  A yearly low-dose CT scan of the lungs is recommended for people who:  Currently smoke.  Have quit within the past 15 years.  Have at least a 30-pack-year history of smoking. A pack year is smoking an average of one pack of cigarettes a day for 1 year.  Yearly screening should continue until it has been 15 years since you quit.  Yearly screening should stop if you develop a health problem that would prevent you from having lung cancer treatment. Breast Cancer  Practice breast self-awareness. This means understanding how your breasts normally appear and feel.  It also means doing regular breast self-exams. Let your health care provider know about any changes, no matter how small.  If you are in your 20s or 30s, you should have a clinical breast exam (CBE) by a health care provider every 1-3 years as part of a regular health exam.  If you are 77 or older, have a CBE every year. Also consider having a breast X-ray (mammogram) every year.  If you have a family history of breast cancer, talk to your health care provider about genetic  screening.  If you are at high risk for breast cancer, talk to your health care provider about having an MRI and a mammogram every year.  Breast cancer gene (BRCA) assessment is recommended for women who have family members with BRCA-related cancers. BRCA-related cancers include:  Breast.  Ovarian.  Tubal.  Peritoneal cancers.  Results of the assessment will determine the need for genetic counseling and BRCA1 and BRCA2 testing. Cervical Cancer  Your health care provider may recommend that you be  screened regularly for cancer of the pelvic organs (ovaries, uterus, and vagina). This screening involves a pelvic examination, including checking for microscopic changes to the surface of your cervix (Pap test). You may be encouraged to have this screening done every 3 years, beginning at age 30.  For women ages 74-65, health care providers may recommend pelvic exams and Pap testing every 3 years, or they may recommend the Pap and pelvic exam, combined with testing for human papilloma virus (HPV), every 5 years. Some types of HPV increase your risk of cervical cancer. Testing for HPV may also be done on women of any age with unclear Pap test results.  Other health care providers may not recommend any screening for nonpregnant women who are considered low risk for pelvic cancer and who do not have symptoms. Ask your health care provider if a screening pelvic exam is right for you.  If you have had past treatment for cervical cancer or a condition that could lead to cancer, you need Pap tests and screening for cancer for at least 20 years after your treatment. If Pap tests have been discontinued, your risk factors (such as having a new sexual partner) need to be reassessed to determine if screening should resume. Some women have medical problems that increase the chance of getting cervical cancer. In these cases, your health care provider may recommend more frequent screening and Pap tests. Colorectal Cancer  This type of cancer can be detected and often prevented.  Routine colorectal cancer screening usually begins at 27 years of age and continues through 27 years of age.  Your health care provider may recommend screening at an earlier age if you have risk factors for colon cancer.  Your health care provider may also recommend using home test kits to check for hidden blood in the stool.  A small camera at the end of a tube can be used to examine your colon directly (sigmoidoscopy or colonoscopy).  This is done to check for the earliest forms of colorectal cancer.  Routine screening usually begins at age 69.  Direct examination of the colon should be repeated every 5-10 years through 27 years of age. However, you may need to be screened more often if early forms of precancerous polyps or small growths are found. Skin Cancer  Check your skin from head to toe regularly.  Tell your health care provider about any new moles or changes in moles, especially if there is a change in a mole's shape or color.  Also tell your health care provider if you have a mole that is larger than the size of a pencil eraser.  Always use sunscreen. Apply sunscreen liberally and repeatedly throughout the day.  Protect yourself by wearing long sleeves, pants, a wide-brimmed hat, and sunglasses whenever you are outside. Heart disease, diabetes, and high blood pressure  High blood pressure causes heart disease and increases the risk of stroke. High blood pressure is more likely to develop in:  People who have blood pressure  in the high end of the normal range (130-139/85-89 mm Hg).  People who are overweight or obese.  People who are African American.  If you are 55-37 years of age, have your blood pressure checked every 3-5 years. If you are 59 years of age or older, have your blood pressure checked every year. You should have your blood pressure measured twice-once when you are at a hospital or clinic, and once when you are not at a hospital or clinic. Record the average of the two measurements. To check your blood pressure when you are not at a hospital or clinic, you can use:  An automated blood pressure machine at a pharmacy.  A home blood pressure monitor.  If you are between 39 years and 68 years old, ask your health care provider if you should take aspirin to prevent strokes.  Have regular diabetes screenings. This involves taking a blood sample to check your fasting blood sugar level.  If you  are at a normal weight and have a low risk for diabetes, have this test once every three years after 27 years of age.  If you are overweight and have a high risk for diabetes, consider being tested at a younger age or more often. Preventing infection Hepatitis B  If you have a higher risk for hepatitis B, you should be screened for this virus. You are considered at high risk for hepatitis B if:  You were born in a country where hepatitis B is common. Ask your health care provider which countries are considered high risk.  Your parents were born in a high-risk country, and you have not been immunized against hepatitis B (hepatitis B vaccine).  You have HIV or AIDS.  You use needles to inject street drugs.  You live with someone who has hepatitis B.  You have had sex with someone who has hepatitis B.  You get hemodialysis treatment.  You take certain medicines for conditions, including cancer, organ transplantation, and autoimmune conditions. Hepatitis C  Blood testing is recommended for:  Everyone born from [birth year] through 1966.  Anyone with known risk factors for hepatitis C. Sexually transmitted infections (STIs)  You should be screened for sexually transmitted infections (STIs) including gonorrhea and chlamydia if:  You are sexually active and are younger than 27 years of age.  You are older than 27 years of age and your health care provider tells you that you are at risk for this type of infection.  Your sexual activity has changed since you were last screened and you are at an increased risk for chlamydia or gonorrhea. Ask your health care provider if you are at risk.  If you do not have HIV, but are at risk, it may be recommended that you take a prescription medicine daily to prevent HIV infection. This is called pre-exposure prophylaxis (PrEP). You are considered at risk if:  You are sexually active and do not regularly use condoms or know the HIV status of your  partner(s).  You take drugs by injection.  You are sexually active with a partner who has HIV. Talk with your health care provider about whether you are at high risk of being infected with HIV. If you choose to begin PrEP, you should first be tested for HIV. You should then be tested every 3 months for as long as you are taking PrEP. Pregnancy  If you are premenopausal and you may become pregnant, ask your health care provider about preconception counseling.  If you  may become pregnant, take 400 to 800 micrograms (mcg) of folic acid every day.  If you want to prevent pregnancy, talk to your health care provider about birth control (contraception). Osteoporosis and menopause  Osteoporosis is a disease in which the bones lose minerals and strength with aging. This can result in serious bone fractures. Your risk for osteoporosis can be identified using a bone density scan.  If you are 49 years of age or older, or if you are at risk for osteoporosis and fractures, ask your health care provider if you should be screened.  Ask your health care provider whether you should take a calcium or vitamin D supplement to lower your risk for osteoporosis.  Menopause may have certain physical symptoms and risks.  Hormone replacement therapy may reduce some of these symptoms and risks. Talk to your health care provider about whether hormone replacement therapy is right for you. Follow these instructions at home:  Schedule regular health, dental, and eye exams.  Stay current with your immunizations.  Do not use any tobacco products including cigarettes, chewing tobacco, or electronic cigarettes.  If you are pregnant, do not drink alcohol.  If you are breastfeeding, limit how much and how often you drink alcohol.  Limit alcohol intake to no more than 1 drink per day for nonpregnant women. One drink equals 12 ounces of beer, 5 ounces of wine, or 1 ounces of hard liquor.  Do not use street  drugs.  Do not share needles.  Ask your health care provider for help if you need support or information about quitting drugs.  Tell your health care provider if you often feel depressed.  Tell your health care provider if you have ever been abused or do not feel safe at home. This information is not intended to replace advice given to you by your health care provider. Make sure you discuss any questions you have with your health care provider. Document Released: 05/27/2011 Document Revised: 04/18/2016 Document Reviewed: 08/15/2015 Elsevier Interactive Patient Education  2017 [place].  -  Back Exercises If you have pain in your back, do these exercises 2-3 times each day or as told by your doctor. When the pain goes away, do the exercises once each day, but repeat the steps more times for each exercise (do more repetitions). If you do not have pain in your back, do these exercises once each day or as told by your doctor. Exercises Single Knee to Chest   Do these steps 3-5 times in a row for each leg: 1. Lie on your back on a firm bed or the floor with your legs stretched out. 2. Bring one knee to your chest. 3. Hold your knee to your chest by grabbing your knee or thigh. 4. Pull on your knee until you feel a gentle stretch in your lower back. 5. Keep doing the stretch for 10-30 seconds. 6. Slowly let go of your leg and straighten it. Pelvic Tilt   Do these steps 5-10 times in a row: 1. Lie on your back on a firm bed or the floor with your legs stretched out. 2. Bend your knees so they point up to the ceiling. Your feet should be flat on the floor. 3. Tighten your lower belly (abdomen) muscles to press your lower back against the floor. This will make your tailbone point up to the ceiling instead of pointing down to your feet or the floor. 4. Stay in this position for 5-10 seconds while you gently  tighten your muscles and breathe evenly. Cat-Cow   Do these steps until your  lower back bends more easily: 1. Get on your hands and knees on a firm surface. Keep your hands under your shoulders, and keep your knees under your hips. You may put padding under your knees. 2. Let your head hang down, and make your tailbone point down to the floor so your lower back is round like the back of a cat. 3. Stay in this position for 5 seconds. 4. Slowly lift your head and make your tailbone point up to the ceiling so your back hangs low (sags) like the back of a cow. 5. Stay in this position for 5 seconds. Press-Ups   Do these steps 5-10 times in a row: 1. Lie on your belly (face-down) on the floor. 2. Place your hands near your head, about shoulder-width apart. 3. While you keep your back relaxed and keep your hips on the floor, slowly straighten your arms to raise the top half of your body and lift your shoulders. Do not use your back muscles. To make yourself more comfortable, you may change where you place your hands. 4. Stay in this position for 5 seconds. 5. Slowly return to lying flat on the floor. Bridges   Do these steps 10 times in a row: 1. Lie on your back on a firm surface. 2. Bend your knees so they point up to the ceiling. Your feet should be flat on the floor. 3. Tighten your butt muscles and lift your butt off of the floor until your waist is almost as high as your knees. If you do not feel the muscles working in your butt and the back of your thighs, slide your feet 1-2 inches farther away from your butt. 4. Stay in this position for 3-5 seconds. 5. Slowly lower your butt to the floor, and let your butt muscles relax. If this exercise is too easy, try doing it with your arms crossed over your chest. Belly Crunches   Do these steps 5-10 times in a row: 1. Lie on your back on a firm bed or the floor with your legs stretched out. 2. Bend your knees so they point up to the ceiling. Your feet should be flat on the floor. 3. Cross your arms over your  chest. 4. Tip your chin a little bit toward your chest but do not bend your neck. 5. Tighten your belly muscles and slowly raise your chest just enough to lift your shoulder blades a tiny bit off of the floor. 6. Slowly lower your chest and your head to the floor. Back Lifts  Do these steps 5-10 times in a row: 1. Lie on your belly (face-down) with your arms at your sides, and rest your forehead on the floor. 2. Tighten the muscles in your legs and your butt. 3. Slowly lift your chest off of the floor while you keep your hips on the floor. Keep the back of your head in line with the curve in your back. Look at the floor while you do this. 4. Stay in this position for 3-5 seconds. 5. Slowly lower your chest and your face to the floor. Contact a doctor if:  Your back pain gets a lot worse when you do an exercise.  Your back pain does not lessen 2 hours after you exercise. If you have any of these problems, stop doing the exercises. Do not do them again unless your doctor says it is okay.  Get help right away if:  You have sudden, very bad back pain. If this happens, stop doing the exercises. Do not do them again unless your doctor says it is okay. This information is not intended to replace advice given to you by your health care provider. Make sure you discuss any questions you have with your health care provider. Document Released: 12/14/2010 Document Revised: 04/18/2016 Document Reviewed: 01/05/2015 Elsevier Interactive Patient Education  2017 [employer].

## 2017-03-25 NOTE — Telephone Encounter (Signed)
CMA call regarding MRI results  Patient did not answer but left a VM stating the reason of the call & to call back

## 2017-03-25 NOTE — Progress Notes (Signed)
Kim Johns, is a 26 y.o. female  MWN:[id number]  [id number]  DOB - 1990-12-29  Chief Complaint  Patient presents with  . Urinary Tract Infection        Subjective:   Kim Johns is a 26 y.o. female here today for a follow up visit for frequent urination. She said it initially was lower back pain, (which she chronically has), but was different from normal. Than she started having increasing frequency, denies dysuria.  She is here w/ her young very cute 71 month boy.  Patient has No headache, No chest pain, No abdominal pain - No Nausea, No new weakness tingling or numbness, No Cough - SOB.  No problems updated.  ALLERGIES: Allergies  Allergen Reactions  . Shellfish Allergy Hives  . Sulfa Antibiotics Other (See Comments)    Reaction:  Unknown; childhood reaction  . Icy Hot Rash    PAST MEDICAL HISTORY: Past Medical History:  Diagnosis Date  . Abscess of breast   . Allergy   . Anxiety   . Asthma    albuterol last used a few months ago  . HSV-2 (herpes simplex virus 2) infection   . Preterm labor     MEDICATIONS AT HOME: Prior to Admission medications   Medication Sig Start Date End Date Taking? Authorizing Provider  SPRINTEC 28 0.25-35 MG-MCG tablet TAKE 1 TABLET BY MOUTH EVERY DAY (ONLY TAKE ACTIVE PILLS CONTINUOUSLY) 08/05/16  Yes Osborne Oman, MD     Objective:   Vitals:   03/25/17 1500  BP: (!) 118/58  Pulse: 87  Resp: 16  Temp: 98.4 F (36.9 C)  TempSrc: Oral  SpO2: 99%  Weight: 175 lb 9.6 oz (79.7 kg)    Exam General appearance : Awake, alert, not in any distress. Speech Clear. Not toxic looking HEENT: Atraumatic and Normocephalic, pupils equally reactive to light. Neck: supple, no JVD. No cervical lymphadenopathy.  Chest:Good air entry bilaterally, no added sounds. CVS: S1 S2 regular, no murmurs/gallups or rubs. Abdomen: Bowel sounds active, Non tender and not distended with no gaurding, rigidity or rebound.  No bilat cva  tenderness. Extremities: B/L Lower Ext shows no edema, both legs are warm to touch Neurology: Awake alert, and oriented X 3, CN II-XII grossly intact, Non focal Skin:No Rash  Data Review Lab Results  Component Value Date   HGBA1C 4.9 08/04/2013    Depression screen [facility] 2/9 03/25/2017 03/03/2017 12/17/2016 09/06/2015 06/26/2015  Decreased Interest 0 1 0 0 0  Down, Depressed, Hopeless 0 1 0 0 0  PHQ - 2 Score 0 2 0 0 0  Altered sleeping 1 1 0 - -  Tired, decreased energy 1 1 1  - -  Change in appetite 1 0 2 - -  Feeling bad or failure about yourself  0 0 0 - -  Trouble concentrating 1 1 0 - -  Moving slowly or fidgety/restless 0 0 0 - -  Suicidal thoughts 0 0 0 - -  PHQ-9 Score 4 5 3  - -      Assessment & Plan   1. Frequent urination - suspect msk pain - POCT urinalysis dipstick  - neg - Urine culture - Urine cytology ancillary only -  cranberry juice may help if she gets frequent utis.  2. msk pain Recd back stretches, warm heat as needed   Patient have been counseled extensively about nutrition and exercise  Return in about 3 months (around 06/25/2017), or if symptoms worsen or fail to improve.  The  patient was given clear instructions to go to ER or return to medical center if symptoms don't improve, worsen or new problems develop. The patient verbalized understanding. The patient was told to call to get lab results if they haven't heard anything in the next week.   This note has been created with Surveyor, quantity. Any transcriptional errors are unintentional.   Maren Reamer, MD, [place] [facility], [place]   03/25/2017, 3:25 PM

## 2017-03-25 NOTE — Telephone Encounter (Signed)
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  Follow up with orthopedic referral.

## 2017-03-26 LAB — URINE CYTOLOGY ANCILLARY ONLY
CHLAMYDIA, DNA PROBE: NEGATIVE
Neisseria Gonorrhea: NEGATIVE
TRICH (WINDOWPATH): NEGATIVE

## 2017-03-27 LAB — URINE CULTURE

## 2017-03-28 LAB — URINE CYTOLOGY ANCILLARY ONLY
Bacterial vaginitis: NEGATIVE
Candida vaginitis: NEGATIVE

## 2017-03-31 ENCOUNTER — Ambulatory Visit: Payer: Medicaid Other | Attending: Family Medicine

## 2017-04-01 ENCOUNTER — Telehealth: Payer: Self-pay

## 2017-04-01 NOTE — Telephone Encounter (Signed)
Contacted pt to go over lab results pt is aware and doesn't have any questions or concerns 

## 2017-05-09 ENCOUNTER — Encounter (HOSPITAL_COMMUNITY): Payer: Self-pay | Admitting: Emergency Medicine

## 2017-05-09 ENCOUNTER — Emergency Department (HOSPITAL_COMMUNITY)
Admission: EM | Admit: 2017-05-09 | Discharge: 2017-05-09 | Disposition: A | Discharge status: Home / Self Care | Payer: Medicaid Other | Attending: Emergency Medicine | Admitting: Emergency Medicine

## 2017-05-09 DIAGNOSIS — J45909 Unspecified asthma, uncomplicated: Secondary | ICD-10-CM | POA: Insufficient documentation

## 2017-05-09 DIAGNOSIS — Z87891 Personal history of nicotine dependence: Secondary | ICD-10-CM | POA: Insufficient documentation

## 2017-05-09 DIAGNOSIS — R112 Nausea with vomiting, unspecified: Secondary | ICD-10-CM

## 2017-05-09 DIAGNOSIS — Z79899 Other long term (current) drug therapy: Secondary | ICD-10-CM | POA: Insufficient documentation

## 2017-05-09 MED ORDER — ONDANSETRON 4 MG PO TBDP
4.0000 mg | ORAL_TABLET | Freq: Once | ORAL | Status: AC
  Administered 2017-05-09: 4 mg via ORAL
  Filled 2017-05-09: qty 1

## 2017-05-09 MED ORDER — ONDANSETRON 4 MG PO TBDP: 4.0000 mg | ORAL_TABLET | Freq: Three times a day (TID) | ORAL | 0 refills | Status: DC | PRN

## 2017-05-09 MED ORDER — SODIUM CHLORIDE 0.9 % IV BOLUS (SEPSIS)
1000.0000 mL | Freq: Once | INTRAVENOUS | Status: AC
  Administered 2017-05-09: 1000 mL via INTRAVENOUS

## 2017-05-09 MED ORDER — ONDANSETRON HCL 4 MG/2ML IJ SOLN
4.0000 mg | Freq: Once | INTRAMUSCULAR | Status: AC
  Administered 2017-05-09: 4 mg via INTRAVENOUS
  Filled 2017-05-09: qty 2

## 2017-05-09 NOTE — ED Triage Notes (Signed)
Pt states she was on a cook out last night she had one alcoholic beverage and she started vomiting since 1 am none stop unable to tolerate even water. Denies fever, chills or pain.

## 2017-05-09 NOTE — ED Notes (Signed)
Water at bedside

## 2017-05-09 NOTE — Discharge Instructions (Signed)
Follow up with your PCP. Return for abdominal pain, fever.

## 2017-05-09 NOTE — ED Provider Notes (Signed)
[place] DEPT Provider Note   CSN: [id number] Arrival date & time: 05/09/17  0518     History   Chief Complaint Chief Complaint  Patient presents with  . Emesis    HPI Kim Johns is a 26 y.o. female.  26 yo F with a cc of nausea and vomiting.  Going on for past four hours.  Was at a BBQ last night.  Denies suspicious food intake.  1 alcoholic beverage without intoxication.  Denies other sick people at the party.  Deny abdominal pain, dysuria or liklihood of being pregnant.    The history is provided by the patient.  Emesis   This is a new problem. The current episode started 3 to 5 hours ago. The problem occurs more than 10 times per day. The problem has not changed since onset.The emesis has an appearance of stomach contents. There has been no fever. Pertinent negatives include no abdominal pain, no arthralgias, no chills, no fever, no headaches and no myalgias.    Past Medical History:  Diagnosis Date  . Abscess of breast   . Allergy   . Anxiety   . Asthma    albuterol last used a few months ago  . HSV-2 (herpes simplex virus 2) infection   . Preterm labor     Patient Active Problem List   Diagnosis Date Noted  . Frequent urination 03/25/2017  . Anxiety 12/17/2016  . 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 Infection associated with intrauterine device (IUD) ([place]) 10/25/2015  . TOA (tubo-ovarian abscess) 10/25/2015  . PID (acute pelvic inflammatory disease) 10/24/2015    Past Surgical History:  Procedure Laterality Date  . CERVICAL CERCLAGE N/A 04/05/2015   Procedure: CERCLAGE CERVICAL;  Surgeon: Lavonia Drafts, MD;  Location: [facility];  Service: Gynecology;  Laterality: N/A;  . WISDOM TOOTH EXTRACTION      OB History    Gravida Para Term Preterm AB Living   2 2 1 1  0 1   SAB TAB Ectopic Multiple Live Births   0 0 0 0 1       Home Medications    Prior to Admission medications   Medication Sig Start Date End Date Taking?  Authorizing Provider  ondansetron (ZOFRAN ODT) 4 MG disintegrating tablet Take 1 tablet (4 mg total) by mouth every 8 (eight) hours as needed for nausea or vomiting. 05/09/17   Deno Etienne, DO  [place] 28 0.25-35 MG-MCG tablet TAKE 1 TABLET BY MOUTH EVERY DAY (ONLY TAKE ACTIVE PILLS CONTINUOUSLY) 08/05/16   Anyanwu, Sallyanne Havers, MD    Family History Family History  Problem Relation Age of Onset  . Diabetes Mother   . Stroke Mother   . Hypertension Father     Social History Social History  Substance Use Topics  . Smoking status: Former Smoker    Packs/day: 0.50    Years: 10.00    Types: Cigarettes  . Smokeless tobacco: Never Used  . Alcohol use 0.0 oz/week     Comment: occ     Allergies   Shellfish allergy; Sulfa antibiotics; and Icy hot   Review of Systems Review of Systems  Constitutional: Negative for chills and fever.  HENT: Negative for congestion and rhinorrhea.   Eyes: Negative for redness and visual disturbance.  Respiratory: Negative for shortness of breath and wheezing.   Cardiovascular: Negative for chest pain and palpitations.  Gastrointestinal: Positive for nausea and vomiting. Negative for abdominal pain.  Genitourinary: Negative for dysuria and urgency.  Musculoskeletal:  Negative for arthralgias and myalgias.  Skin: Negative for pallor and wound.  Neurological: Negative for dizziness and headaches.     Physical Exam Updated Vital Signs BP 108/65 (BP Location: Right Arm)   Pulse 60   Temp 98.2 F (36.8 C)   Resp 16   Ht 5\' 2"  (1.575 m)   Wt 79.4 kg (175 lb)   LMP 04/28/2017   SpO2 100%   BMI 32.01 kg/m   Physical Exam  Constitutional: She is oriented to person, place, and time. She appears well-developed and well-nourished. No distress.  HENT:  Head: Normocephalic and atraumatic.  Eyes: EOM are normal. Pupils are equal, round, and reactive to light.  Neck: Normal range of motion. Neck supple.  Cardiovascular: Normal rate and regular rhythm.   Exam reveals no gallop and no friction rub.   No murmur heard. Pulmonary/Chest: Effort normal. She has no wheezes. She has no rales.  Abdominal: Soft. She exhibits no distension and no mass. There is no tenderness. There is no guarding.  Musculoskeletal: She exhibits no edema or tenderness.  Neurological: She is alert and oriented to person, place, and time.  Skin: Skin is warm and dry. She is not diaphoretic.  Psychiatric: She has a normal mood and affect. Her behavior is normal.  Nursing note and vitals reviewed.    ED Treatments / Results  Labs (all labs ordered are listed, but only abnormal results are displayed) Labs Reviewed - No data to display  EKG  EKG Interpretation None       Radiology No results found.  Procedures Procedures (including critical care time)  Medications Ordered in ED Medications  ondansetron (ZOFRAN-ODT) disintegrating tablet 4 mg (4 mg Oral Given 05/09/17 0547)  ondansetron (ZOFRAN) injection 4 mg (4 mg Intravenous Given 05/09/17 0640)  sodium chloride 0.9 % bolus 1,000 mL (0 mLs Intravenous Stopped 05/09/17 0807)     Initial Impression / Assessment and Plan / ED Course  I have reviewed the triage vital signs and the nursing notes.  Pertinent labs & imaging results that were available during my care of the patient were reviewed by me and considered in my medical decision making (see chart for details).  Clinical Course as of May 11 743  Fri May 09, 2017  0700 Feeling better on reassessment, getting iv fluids, tolerating po  [DF]    Clinical Course User Index [DF] Deno Etienne, DO    26 yo F with n/v.  Last four hours.  Doubt labs will be helpful at this time.   Throwing up post zofran, will start iv, give fluids reassess.   Improved.  D/c home.   7:44 AM:  I have discussed the diagnosis/risks/treatment options with the patient and believe the pt to be eligible for discharge home to follow-up with PCP. We also discussed returning to the  ED immediately if new or worsening sx occur. We discussed the sx which are most concerning (e.g., sudden worsening pain, fever, inability to tolerate by mouth) that necessitate immediate return. Medications administered to the patient during their visit and any new prescriptions provided to the patient are listed below.  Medications given during this visit Medications  ondansetron (ZOFRAN-ODT) disintegrating tablet 4 mg (4 mg Oral Given 05/09/17 0547)  ondansetron (ZOFRAN) injection 4 mg (4 mg Intravenous Given 05/09/17 0640)  sodium chloride 0.9 % bolus 1,000 mL (0 mLs Intravenous Stopped 05/09/17 0807)     The patient appears reasonably screen and/or stabilized for discharge and I doubt any other  medical condition or other [facility] requiring further screening, evaluation, or treatment in the ED at this time prior to discharge.    Final Clinical Impressions(s) / ED Diagnoses   Final diagnoses:  Nausea and vomiting in adult    New Prescriptions Discharge Medication List as of 05/09/2017  7:40 AM    START taking these medications   Details  ondansetron (ZOFRAN ODT) 4 MG disintegrating tablet Take 1 tablet (4 mg total) by mouth every 8 (eight) hours as needed for nausea or vomiting., Starting Fri 05/09/2017, Print         [place], Linna Hoff, DO 05/10/17 [phone]

## 2017-06-09 MED FILL — MUPIROCIN 2% OINTMENT: 2 | 7 days supply | Qty: 22 | Fill #0

## 2017-06-09 MED FILL — FLUCONAZOLE 150 MG TABLET: 150 | 1 days supply | Qty: 1 | Fill #0

## 2017-06-09 MED FILL — DOXYCYCLINE 100 MG TABLET: 100 | 30 days supply | Qty: 60 | Fill #0

## 2017-06-17 MED FILL — FLUCONAZOLE 150 MG TABLET: 150 | 1 days supply | Qty: 1 | Fill #1

## 2017-08-05 ENCOUNTER — Encounter (INDEPENDENT_AMBULATORY_CARE_PROVIDER_SITE_OTHER): Payer: Self-pay | Admitting: Orthopaedic Surgery

## 2017-08-05 ENCOUNTER — Ambulatory Visit (INDEPENDENT_AMBULATORY_CARE_PROVIDER_SITE_OTHER): Payer: Self-pay | Admitting: Orthopaedic Surgery

## 2017-08-05 VITALS — BP 112/76 | HR 76 | Ht 62.0 in | Wt 170.0 lb

## 2017-08-05 DIAGNOSIS — G8929 Other chronic pain: Secondary | ICD-10-CM

## 2017-08-05 DIAGNOSIS — M545 Low back pain: Secondary | ICD-10-CM

## 2017-08-05 NOTE — Progress Notes (Signed)
Office Visit Note   Patient: Kim Johns           Date of Birth: [birth year]           MRN: [id number] Visit Date: 08/05/2017              Requested by: Alfonse Spruce, [place], [place] [address] PCP: Alfonse Spruce, FNP   Assessment & Plan: Visit Diagnoses:  1. Chronic midline low back pain without sciatica     Plan: She'll continue to work on exercise activities. Physical therapy ordered for home exercise program that she can incorporate for core strengthening. She is using Tylenol which helps with the pain. She can return if she develops any radicular symptoms. I discussed with her this point no further treatment is indicated. MRI scan was reviewed and I gave her a copy of the report.  Follow-Up Instructions: No Follow-up on file.   Orders:  No orders of the defined types were placed in this encounter.  No orders of the defined types were placed in this encounter.     Procedures: No procedures performed   Clinical Data: No additional findings.   Subjective: Chief Complaint  Patient presents with  . Lower Back - Pain, Follow-up    HPI 26 year old female returns with ongoing back pain. It primarily as long as posterior right iliac crest. She is been working out several times a week and has lost weight 10 pounds over the last month and overall total of about 50 pounds. 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 She had an MRI scan in April that showed minimal disc bulge at L4-5 and L5-S1 and slight retrolisthesis at L5-S1 seen on previous radiographs. She denies any associated bowel or bladder symptoms. She was seen in the ER for abdominal pain and had a normal abdominal and pelvic CT scan. No associated bowel or bladder symptoms.  Review of Systems 14 point review of systems is updated and is unchanged from last office visit other than as mentioned in history of present illness.   Objective: Vital Signs: BP 112/76    Pulse 76   Ht 5\' 2"  (1.575 m)   Wt 170 lb (77.1 kg)   BMI 31.09 kg/m   Physical Exam  Constitutional: She is oriented to person, place, and time. She appears well-developed.  HENT:  Head: Normocephalic.  Right Ear: External ear normal.  Left Ear: External ear normal.  Eyes: Pupils are equal, round, and reactive to light.  Neck: No tracheal deviation present. No thyromegaly present.  Cardiovascular: Normal rate.   Pulmonary/Chest: Effort normal.  Abdominal: Soft.  Musculoskeletal:  The patient has normal reflexes normal range of motion negative straight leg raising 90. No sinus tenderness no trochanteric bursal tenderness. Good lower extremity strength. Normal lumbar range of motion. Skin over the back is normal. Sensory testing lower extremities is normal.  Neurological: She is alert and oriented to person, place, and time.  Skin: Skin is warm and dry.  Psychiatric: She has a normal mood and affect. Her behavior is normal.    Ortho Exam  Specialty Comments:  No specialty comments available.  Imaging: No results found.   PMFS History: Patient Active Problem List   Diagnosis Date Noted  . Frequent urination 03/25/2017  . Anxiety 12/17/2016  . 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 Infection associated with intrauterine device (IUD) ([place]) 10/25/2015  . TOA (tubo-ovarian abscess) 10/25/2015  . PID (acute pelvic inflammatory  disease) 10/24/2015   Past Medical History:  Diagnosis Date  . Abscess of breast   . Allergy   . Anxiety   . Asthma    albuterol last used a few months ago  . HSV-2 (herpes simplex virus 2) infection   . Preterm labor     Family History  Problem Relation Age of Onset  . Diabetes Mother   . Stroke Mother   . Hypertension Father     Past Surgical History:  Procedure Laterality Date  . CERVICAL CERCLAGE N/A 04/05/2015   Procedure: CERCLAGE CERVICAL;  Surgeon: Lavonia Drafts, MD;  Location: [facility];  Service: Gynecology;   Laterality: N/A;  . WISDOM TOOTH EXTRACTION     Social History   Occupational History  . Not on file.   Social History Main Topics  . Smoking status: Former Smoker    Packs/day: 0.50    Years: 10.00    Types: Cigarettes  . Smokeless tobacco: Never Used  . Alcohol use 0.0 oz/week     Comment: occ  . Drug use: No  . Sexual activity: Yes    Birth control/ protection: Pill

## 2017-08-05 NOTE — Addendum Note (Signed)
Addended by: Meyer Cory on: 08/05/2017 02:45 PM   Modules accepted: Orders

## 2017-10-14 ENCOUNTER — Encounter (HOSPITAL_COMMUNITY): Payer: Self-pay | Admitting: Emergency Medicine

## 2017-10-14 ENCOUNTER — Emergency Department (HOSPITAL_COMMUNITY): Payer: Self-pay

## 2017-10-14 DIAGNOSIS — Z79899 Other long term (current) drug therapy: Secondary | ICD-10-CM | POA: Insufficient documentation

## 2017-10-14 DIAGNOSIS — K219 Gastro-esophageal reflux disease without esophagitis: Secondary | ICD-10-CM | POA: Insufficient documentation

## 2017-10-14 DIAGNOSIS — Z87891 Personal history of nicotine dependence: Secondary | ICD-10-CM | POA: Insufficient documentation

## 2017-10-14 LAB — BASIC METABOLIC PANEL
ANION GAP: 6 (ref 5–15)
BUN: 8 mg/dL (ref 6–20)
CHLORIDE: 108 mmol/L (ref 101–111)
CO2: 25 mmol/L (ref 22–32)
Calcium: 9.3 mg/dL (ref 8.9–10.3)
Creatinine, Ser: 0.88 mg/dL (ref 0.44–1.00)
GFR calc non Af Amer: >60 mL/min (ref >60)
GLUCOSE: 76 mg/dL (ref 65–99)
POTASSIUM: 3.7 mmol/L (ref 3.5–5.1)
Sodium: 139 mmol/L (ref 135–145)

## 2017-10-14 LAB — CBC
HEMATOCRIT: 41.7 % (ref 36.0–46.0)
HEMOGLOBIN: 15.1 g/dL — AB (ref 12.0–15.0)
MCH: 34.9 pg — AB (ref 26.0–34.0)
MCHC: 36.2 g/dL — AB (ref 30.0–36.0)
MCV: 96.3 fL (ref 78.0–100.0)
Platelets: 287 10*3/uL (ref 150–400)
RBC: 4.33 MIL/uL (ref 3.87–5.11)
RDW: 12.7 % (ref 11.5–15.5)
WBC: 7.2 10*3/uL (ref 4.0–10.5)

## 2017-10-14 LAB — I-STAT TROPONIN, ED: TROPONIN I, POC: 0.01 ng/mL (ref 0.00–0.08)

## 2017-10-14 NOTE — ED Triage Notes (Signed)
Pt c/o sharp chest pain x "a couple of days", also states she feels like she needs to burp but can't. Denies shortness of breath/nausea/vomiting.

## 2017-10-15 ENCOUNTER — Emergency Department (HOSPITAL_COMMUNITY)
Admission: EM | Admit: 2017-10-15 | Discharge: 2017-10-15 | Disposition: A | Discharge status: Home / Self Care | Payer: Self-pay | Attending: Emergency Medicine | Admitting: Emergency Medicine

## 2017-10-15 ENCOUNTER — Encounter (HOSPITAL_COMMUNITY): Payer: Self-pay | Admitting: Emergency Medicine

## 2017-10-15 DIAGNOSIS — K219 Gastro-esophageal reflux disease without esophagitis: Secondary | ICD-10-CM

## 2017-10-15 LAB — D-DIMER, QUANTITATIVE (NOT AT ARMC)

## 2017-10-15 LAB — I-STAT BETA HCG BLOOD, ED (MC, WL, AP ONLY): I-stat hCG, quantitative: <5 m[IU]/mL (ref <5)

## 2017-10-15 MED ORDER — ACETAMINOPHEN 500 MG PO TABS
1000.0000 mg | ORAL_TABLET | Freq: Once | ORAL | Status: AC
  Administered 2017-10-15: 1000 mg via ORAL
  Filled 2017-10-15: qty 2

## 2017-10-15 MED ORDER — IBUPROFEN 800 MG PO TABS
800.0000 mg | ORAL_TABLET | Freq: Once | ORAL | Status: AC
  Administered 2017-10-15: 800 mg via ORAL
  Filled 2017-10-15: qty 1

## 2017-10-15 MED ORDER — OMEPRAZOLE 20 MG PO CPDR: 20.0000 mg | DELAYED_RELEASE_CAPSULE | Freq: Every day | ORAL | 0 refills | Status: DC

## 2017-10-15 MED ORDER — GI COCKTAIL ~~LOC~~
30.0000 mL | Freq: Once | ORAL | Status: AC
  Administered 2017-10-15: 30 mL via ORAL
  Filled 2017-10-15: qty 30

## 2017-10-15 NOTE — ED Notes (Signed)
Called main lab on status of D-dimer. Was told it was being ran now and would be a couple of minutes.

## 2017-10-15 NOTE — ED Provider Notes (Signed)
[place] EMERGENCY DEPARTMENT Provider Note   CSN: [id number] Arrival date & time: 10/14/17  2251     History   Chief Complaint Chief Complaint  Patient presents with  . Chest Pain    HPI Kim Johns is a 26 y.o. female.  The history is provided by the patient.  Chest Pain   This is a new problem. The current episode started 2 days ago. The problem occurs constantly. The problem has not changed since onset.The pain is associated with rest. The pain is present in the substernal region. The pain is moderate. The quality of the pain is described as sharp. The pain does not radiate. Pertinent negatives include no diaphoresis, no fever and no palpitations. Associated symptoms comments: Feeling gassy and needing to burp. She has tried nothing for the symptoms. The treatment provided no relief. Risk factors: none.  Pertinent negatives for past medical history include no Marfan's syndrome, no MI and no PE.  Pertinent negatives for family medical history include: no Marfan's syndrome.  Procedure history is negative for stress echo.    Past Medical History:  Diagnosis Date  . Abscess of breast   . Allergy   . Anxiety   . Asthma    albuterol last used a few months ago  . HSV-2 (herpes simplex virus 2) infection   . Preterm labor     Patient Active Problem List   Diagnosis Date Noted  . Frequent urination 03/25/2017  . Anxiety 12/17/2016  . 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 Infection associated with intrauterine device (IUD) ([place]) 10/25/2015  . TOA (tubo-ovarian abscess) 10/25/2015  . PID (acute pelvic inflammatory disease) 10/24/2015    Past Surgical History:  Procedure Laterality Date  . CERVICAL CERCLAGE N/A 04/05/2015   Procedure: CERCLAGE CERVICAL;  Surgeon: Lavonia Drafts, MD;  Location: [facility];  Service: Gynecology;  Laterality: N/A;  . WISDOM TOOTH EXTRACTION      OB History    Gravida Para Term Preterm AB Living    2 2 1 1  0 1   SAB TAB Ectopic Multiple Live Births   0 0 0 0 1       Home Medications    Prior to Admission medications   Medication Sig Start Date End Date Taking? Authorizing Provider  ondansetron (ZOFRAN ODT) 4 MG disintegrating tablet Take 1 tablet (4 mg total) by mouth every 8 (eight) hours as needed for nausea or vomiting. Patient not taking: Reported on 08/05/2017 05/09/17   Deno Etienne, DO  [place] 28 0.25-35 MG-MCG tablet TAKE 1 TABLET BY MOUTH EVERY DAY (ONLY TAKE ACTIVE PILLS CONTINUOUSLY) 08/05/16   Anyanwu, Sallyanne Havers, MD    Family History Family History  Problem Relation Age of Onset  . Diabetes Mother   . Stroke Mother   . Hypertension Father     Social History Social History   Tobacco Use  . Smoking status: Former Smoker    Packs/day: 0.50    Years: 10.00    Pack years: 5.00    Types: Cigarettes  . Smokeless tobacco: Never Used  Substance Use Topics  . Alcohol use: Yes    Alcohol/week: 0.0 oz    Comment: occ  . Drug use: No     Allergies   Shellfish allergy; Sulfa antibiotics; and Icy hot   Review of Systems Review of Systems  Constitutional: Negative for diaphoresis, fatigue and fever.  Cardiovascular: Positive for chest pain. Negative for palpitations and leg swelling.  All  other systems reviewed and are negative.    Physical Exam Updated Vital Signs BP 109/61   Pulse 73   Temp 98.3 F (36.8 C) (Oral)   Resp (!) 25   Ht 5\' 2"  (1.575 m)   Wt 77.1 kg (170 lb)   LMP 10/12/2017   SpO2 98%   BMI 31.09 kg/m   Physical Exam  Constitutional: She is oriented to person, place, and time. She appears well-developed and well-nourished. No distress.  HENT:  Head: Normocephalic and atraumatic.  Mouth/Throat: No oropharyngeal exudate.  Eyes: Conjunctivae are normal. Pupils are equal, round, and reactive to light.  Neck: Normal range of motion. Neck supple.  Cardiovascular: Normal rate, regular rhythm, normal heart sounds and intact distal  pulses.  Pulmonary/Chest: Effort normal and breath sounds normal. No stridor. She has no wheezes. She has no rales.  Abdominal: Soft. Bowel sounds are normal. She exhibits no mass. There is no tenderness. There is no rebound and no guarding.  Musculoskeletal: Normal range of motion.  Neurological: She is alert and oriented to person, place, and time. She displays normal reflexes. She exhibits normal muscle tone. Coordination normal.  Skin: Skin is warm and dry. Capillary refill takes less than 2 seconds.  Psychiatric: She has a normal mood and affect.     ED Treatments / Results  Labs (all labs ordered are listed, but only abnormal results are displayed)  Results for orders placed or performed during the hospital encounter of 14/43/15  Basic metabolic panel  Result Value Ref Range   Sodium 139 135 - 145 mmol/L   Potassium 3.7 3.5 - 5.1 mmol/L   Chloride 108 101 - 111 mmol/L   CO2 25 22 - 32 mmol/L   Glucose, Bld 76 65 - 99 mg/dL   BUN 8 6 - 20 mg/dL   Creatinine, Ser 0.88 0.44 - 1.00 mg/dL   Calcium 9.3 8.9 - 10.3 mg/dL   GFR calc non Af Amer >60 >60 mL/min   GFR calc Af Amer >60 >60 mL/min   Anion gap 6 5 - 15  CBC  Result Value Ref Range   WBC 7.2 4.0 - 10.5 K/uL   RBC 4.33 3.87 - 5.11 MIL/uL   Hemoglobin 15.1 (H) 12.0 - 15.0 g/dL   HCT 41.7 36.0 - 46.0 %   MCV 96.3 78.0 - 100.0 fL   MCH 34.9 (H) 26.0 - 34.0 pg   MCHC 36.2 (H) 30.0 - 36.0 g/dL   RDW 12.7 11.5 - 15.5 %   Platelets 287 150 - 400 K/uL  I-stat troponin, ED  Result Value Ref Range   Troponin i, poc 0.01 0.00 - 0.08 ng/mL   Comment 3          I-Stat Beta hCG blood, ED ([facility], [facility], [facility] only)  Result Value Ref Range   I-stat hCG, quantitative <5.0 <5 mIU/mL   Comment 3           Dg Chest 2 View  Result Date: 10/14/2017 CLINICAL DATA:  Mid chest pain since yesterday EXAM: CHEST  2 VIEW COMPARISON:  10/31/2016 FINDINGS: The heart size and mediastinal contours are within normal limits. Both lungs are clear. The  visualized skeletal structures are unremarkable. IMPRESSION: No active cardiopulmonary disease. Electronically Signed   By: Ashley Royalty M.D.   On: 10/14/2017 23:14    Radiology Dg Chest 2 View  Result Date: 10/14/2017 CLINICAL DATA:  Mid chest pain since yesterday EXAM: CHEST  2 VIEW COMPARISON:  10/31/2016 FINDINGS:  The heart size and mediastinal contours are within normal limits. Both lungs are clear. The visualized skeletal structures are unremarkable. IMPRESSION: No active cardiopulmonary disease. Electronically Signed   By: Ashley Royalty M.D.   On: 10/14/2017 23:14    Procedures Procedures (including critical care time)  Medications Ordered in ED Medications  gi cocktail (Maalox,Lidocaine,Donnatal) (30 mLs Oral Given 10/15/17 0140)      Final Clinical Impressions(s) / ED Diagnoses  Symptoms consistent with GERD will start PPI and gerd friendly diet and have patient follow up with PMD.  Ruled out for MI and PE in the ED.    All questions answered to the patient's satisfaction.    Strict return precautions for fever, inability to open the mouth, inability to speech, swelling behind the ear or in the neck, fevers,  global weakness, vomiting, swelling or the lips or tongue, chest pain, dyspnea on exertion,shortness of breath, persistent pain, Inability to tolerate liquids or food, cough, altered mental status or any concerns. No signs of systemic illness or infection. The patient is nontoxic-appearing on exam and vital signs are within normal limits.    I have reviewed the triage vital signs and the nursing notes. Pertinent labs &imaging results that were available during my care of the patient were reviewed by me and considered in my medical decision making (see chart for details).  After history, exam, and medical workup I feel the patient has been appropriately medically screened and is safe for discharge home. Pertinent diagnoses were discussed with the patient. Patient was given  return precautions   Assad Harbeson, MD 10/15/17 [phone]

## 2017-10-15 NOTE — ED Notes (Signed)
Pt. Ambulated with steady gait to room. Pt. Denied wanting a wheelchair.

## 2017-11-25 NOTE — L&D Delivery Note (Signed)
Delivery Note Palyn Scrima is a 27 y.o. G3P1101 at [redacted]w[redacted]d admitted for spontaneous labor.  Labor course: no augmentation. SROM while tub still filling, then pt got in tub and delivered baby w/in 8mins w/ 2 pushes.   SROM: 0h 32m with clear fluid  At 1130 a viable female was delivered via spontaneous vaginal delivery waterbirth on hands & knees (Presentation: undeterminable, but vtx).  Infant brought slowly above surface of water by me and mom, tight cord around shoulder (not nuchal) reduced, then infant placed directly on mom's abdomen for bonding/skin-to-skin. Dried/stimulated w/ good resp effort/cry. Delayed cord clamping x 62min, then cord clamped x 2, and cut by FOB.  APGAR: refer to delivery summary , ; weight: pending at time of note.  Mom then assisted out of tub into bed, the placenta separated spontaneously and delivered via CCT and maternal pushing effort.  It was inspected and appears to be intact with a 3 VC.  Placenta/Cord with the following complications: calcifications, few infarctions, yolk sac remnant seen .  40 units of pitocin diluted in 1000cc LR was infused rapidly IV per protocol.Cord pH: not done  Intrapartum complications:  None Anesthesia:  none Episiotomy: none Lacerations:  none Suture Repair: n/a Est. Blood Loss (mL): 50 Sponge and instrument count were correct x2.  Inadequate GBS ppx, got one dose at 1017  Mom to postpartum.  Baby to Couplet care / Skin to Skin. Placenta to L&D. Plans to breastfeed Contraception: undecided, discussed Circ: outpatient  Roma Schanz CNM, [facility] 11/22/2018 11:51 AM

## 2017-12-29 ENCOUNTER — Ambulatory Visit: Payer: Self-pay | Admitting: Obstetrics & Gynecology

## 2018-03-11 ENCOUNTER — Other Ambulatory Visit: Payer: Self-pay

## 2018-03-11 ENCOUNTER — Encounter (HOSPITAL_COMMUNITY): Payer: Self-pay | Admitting: *Deleted

## 2018-03-11 ENCOUNTER — Other Ambulatory Visit: Payer: Self-pay | Admitting: Obstetrics & Gynecology

## 2018-03-11 ENCOUNTER — Other Ambulatory Visit: Payer: Self-pay | Admitting: Student

## 2018-03-11 ENCOUNTER — Inpatient Hospital Stay (HOSPITAL_COMMUNITY)
Admission: AD | Admit: 2018-03-11 | Discharge: 2018-03-11 | Disposition: A | Discharge status: Home / Self Care | Payer: Medicaid Other | Source: Ambulatory Visit | Attending: Family Medicine | Admitting: Family Medicine

## 2018-03-11 DIAGNOSIS — Z91013 Allergy to seafood: Secondary | ICD-10-CM | POA: Insufficient documentation

## 2018-03-11 DIAGNOSIS — N760 Acute vaginitis: Secondary | ICD-10-CM

## 2018-03-11 DIAGNOSIS — B9689 Other specified bacterial agents as the cause of diseases classified elsewhere: Secondary | ICD-10-CM | POA: Insufficient documentation

## 2018-03-11 DIAGNOSIS — Z8249 Family history of ischemic heart disease and other diseases of the circulatory system: Secondary | ICD-10-CM | POA: Diagnosis not present

## 2018-03-11 DIAGNOSIS — Z833 Family history of diabetes mellitus: Secondary | ICD-10-CM | POA: Diagnosis not present

## 2018-03-11 DIAGNOSIS — Z823 Family history of stroke: Secondary | ICD-10-CM | POA: Diagnosis not present

## 2018-03-11 DIAGNOSIS — N898 Other specified noninflammatory disorders of vagina: Secondary | ICD-10-CM | POA: Diagnosis present

## 2018-03-11 DIAGNOSIS — Z882 Allergy status to sulfonamides status: Secondary | ICD-10-CM | POA: Diagnosis not present

## 2018-03-11 DIAGNOSIS — Z87891 Personal history of nicotine dependence: Secondary | ICD-10-CM | POA: Insufficient documentation

## 2018-03-11 DIAGNOSIS — A6009 Herpesviral infection of other urogenital tract: Secondary | ICD-10-CM

## 2018-03-11 HISTORY — DX: Female pelvic inflammatory disease, unspecified: N73.9

## 2018-03-11 LAB — URINALYSIS, ROUTINE W REFLEX MICROSCOPIC
BILIRUBIN URINE: NEGATIVE
Glucose, UA: NEGATIVE mg/dL
Hgb urine dipstick: NEGATIVE
KETONES UR: NEGATIVE mg/dL
LEUKOCYTES UA: NEGATIVE
NITRITE: NEGATIVE
PROTEIN: NEGATIVE mg/dL
Specific Gravity, Urine: 1.014 (ref 1.005–1.030)
pH: 9 — ABNORMAL HIGH (ref 5.0–8.0)

## 2018-03-11 LAB — WET PREP, GENITAL
Sperm: NONE SEEN
TRICH WET PREP: NONE SEEN
Yeast Wet Prep HPF POC: NONE SEEN

## 2018-03-11 LAB — POCT PREGNANCY, URINE: PREG TEST UR: NEGATIVE

## 2018-03-11 MED ORDER — KETOROLAC TROMETHAMINE 60 MG/2ML IM SOLN
60.0000 mg | Freq: Once | INTRAMUSCULAR | Status: AC
  Administered 2018-03-11: 60 mg via INTRAMUSCULAR
  Filled 2018-03-11: qty 2

## 2018-03-11 MED ORDER — METRONIDAZOLE 500 MG PO TABS: 500.0000 mg | ORAL_TABLET | Freq: Two times a day (BID) | ORAL | 0 refills | Status: DC

## 2018-03-11 MED FILL — metroNIDAZOLE 500 MG TABS: 500 | 7 days supply | Qty: 14 | Fill #0

## 2018-03-11 NOTE — MAU Note (Signed)
Has like a really bad odor, first noted yesterday.  Has a hx of yeast and bacterial infections. Watery d/c- past couple days.  Last wk was a lot thicker.

## 2018-03-11 NOTE — Discharge Instructions (Signed)
In late 2019, the [facility] will be moving to the [place]. At that time, the MAU (Maternity Admissions Unit), where you are being seen today, will no longer take care of non-pregnant patients. We strongly encourage you to find a doctor's office before that time, so that you can be seen with any GYN concerns, like vaginal discharge, urinary tract infection, etc.. in a timely manner.  In order to make an office visit more convenient, the [facility] [place] [facility] will be offering evening hours with same-day appointments, walk-in appointments and scheduled appointments available during this time.  [facility] Hours: Monday - 8am - 7:30 pm with walk-in between 4pm- 7:30 pm Tuesday - 8 am - 5 pm (starting 02/24/18 we will be open late and accepting walk-ins from 4pm - 7:30pm) Wednesday - 8 am - 5 pm (starting 05/27/18 we will be open late and accepting walk-ins from 4pm - 7:30pm) Thursday 8 am - 5 pm (starting 08/27/18 we will be open late and accepting walk-ins from 4pm - 7:30pm) Friday 8 am - 5 pm  For an appointment please call the [facility] [place] @ [facility] at [phone]  For urgent needs, [facility] is also available for management of urgent GYN complaints such as vaginal discharge or urinary tract infections.       Bacterial Vaginosis Bacterial vaginosis is a vaginal infection that occurs when the normal balance of bacteria in the vagina is disrupted. It results from an overgrowth of certain bacteria. This is the most common vaginal infection among women ages 86-44. Because bacterial vaginosis increases your risk for STIs (sexually transmitted infections), getting treated can help reduce your risk for chlamydia, gonorrhea, herpes, and HIV (human immunodeficiency virus). Treatment is also important for preventing complications in pregnant women, because this condition can cause an early  (premature) delivery. What are the causes? This condition is caused by an increase in harmful bacteria that are normally present in small amounts in the vagina. However, the reason that the condition develops is not fully understood. What increases the risk? The following factors may make you more likely to develop this condition:  Having a new sexual partner or multiple sexual partners.  Having unprotected sex.  Douching.  Having an intrauterine device (IUD).  Smoking.  Drug and alcohol abuse.  Taking certain antibiotic medicines.  Being pregnant.  You cannot get bacterial vaginosis from toilet seats, bedding, swimming pools, or contact with objects around you. What are the signs or symptoms? Symptoms of this condition include:  Grey or white vaginal discharge. The discharge can also be watery or foamy.  A fish-like odor with discharge, especially after sexual intercourse or during menstruation.  Itching in and around the vagina.  Burning or pain with urination.  Some women with bacterial vaginosis have no signs or symptoms. How is this diagnosed? This condition is diagnosed based on:  Your medical history.  A physical exam of the vagina.  Testing a sample of vaginal fluid under a microscope to look for a large amount of bad bacteria or abnormal cells. Your health care provider may use a cotton swab or a small wooden spatula to collect the sample.  How is this treated? This condition is treated with antibiotics. These may be given as a pill, a vaginal cream, or a medicine that is put into the vagina (suppository). If the condition comes back after treatment, a second round of antibiotics may  be needed. Follow these instructions at home: Medicines  Take over-the-counter and prescription medicines only as told by your health care provider.  Take or use your antibiotic as told by your health care provider. Do not stop taking or using the antibiotic even if you start  to feel better. General instructions  If you have a female sexual partner, tell her that you have a vaginal infection. She should see her health care provider and be treated if she has symptoms. If you have a female sexual partner, he does not need treatment.  During treatment: ? Avoid sexual activity until you finish treatment. ? Do not douche. ? Avoid alcohol as directed by your health care provider. ? Avoid breastfeeding as directed by your health care provider.  Drink enough water and fluids to keep your urine clear or pale yellow.  Keep the area around your vagina and rectum clean. ? Wash the area daily with warm water. ? Wipe yourself from front to back after using the toilet.  Keep all follow-up visits as told by your health care provider. This is important. How is this prevented?  Do not douche.  Wash the outside of your vagina with warm water only.  Use protection when having sex. This includes latex condoms and dental dams.  Limit how many sexual partners you have. To help prevent bacterial vaginosis, it is best to have sex with just one partner (monogamous).  Make sure you and your sexual partner are tested for STIs.  Wear cotton or cotton-lined underwear.  Avoid wearing tight pants and pantyhose, especially during summer.  Limit the amount of alcohol that you drink.  Do not use any products that contain nicotine or tobacco, such as cigarettes and e-cigarettes. If you need help quitting, ask your health care provider.  Do not use illegal drugs. Where to find more information:  Centers for Disease Control and Prevention: AppraiserFraud.fi  American Sexual Health Association (ASHA): www.ashastd.org  U.S. Department of Health and Financial controller, Office on Women's Health: DustingSprays.pl or SecuritiesCard.it Contact a health care provider if:  Your symptoms do not improve, even after treatment.  You have more  discharge or pain when urinating.  You have a fever.  You have pain in your abdomen.  You have pain during sex.  You have vaginal bleeding between periods. Summary  Bacterial vaginosis is a vaginal infection that occurs when the normal balance of bacteria in the vagina is disrupted.  Because bacterial vaginosis increases your risk for STIs (sexually transmitted infections), getting treated can help reduce your risk for chlamydia, gonorrhea, herpes, and HIV (human immunodeficiency virus). Treatment is also important for preventing complications in pregnant women, because the condition can cause an early (premature) delivery.  This condition is treated with antibiotic medicines. These may be given as a pill, a vaginal cream, or a medicine that is put into the vagina (suppository). This information is not intended to replace advice given to you by your health care provider. Make sure you discuss any questions you have with your health care provider. Document Released: 11/11/2005 Document Revised: 03/17/2017 Document Reviewed: 07/27/2016 Elsevier Interactive Patient Education  [employer].

## 2018-03-11 NOTE — MAU Provider Note (Signed)
History     CSN: [id number]  Arrival date and time: 03/11/18 1012   First Provider Initiated Contact with Patient 03/11/18 1048     Chief Complaint  Patient presents with  . Vaginal Discharge  . vag odor   HPI Kim Johns is a 27 y.o. G2P1101 non pregnant female who presents with vaginal discharge with an odor. She states it has been ongoing for the last 3 days. She also reports lower abdominal cramping that she rates a 5/10 and has not tried anything for the pain. She normally goes to [facility] for gyn care.   OB History    Gravida  2   Para  2   Term  1   Preterm  1   AB  0   Living  1     SAB  0   TAB  0   Ectopic  0   Multiple  0   Live Births  1           Past Medical History:  Diagnosis Date  . Abscess of breast   . Allergy   . Anxiety   . Asthma    albuterol last used a few months ago  . HSV-2 (herpes simplex virus 2) infection   . Pelvic inflammatory disease   . Preterm labor     Past Surgical History:  Procedure Laterality Date  . CERVICAL CERCLAGE N/A 04/05/2015   Procedure: CERCLAGE CERVICAL;  Surgeon: Lavonia Drafts, MD;  Location: [facility];  Service: Gynecology;  Laterality: N/A;  . WISDOM TOOTH EXTRACTION      Family History  Problem Relation Age of Onset  . Diabetes Mother   . Stroke Mother   . Hypertension Father     Social History   Tobacco Use  . Smoking status: Former Smoker    Packs/day: 0.50    Years: 10.00    Pack years: 5.00    Types: Cigarettes  . Smokeless tobacco: Never Used  Substance Use Topics  . Alcohol use: Yes    Alcohol/week: 0.0 oz    Comment: occ  . Drug use: No    Allergies:  Allergies  Allergen Reactions  . Shellfish Allergy Hives  . Sulfa Antibiotics Other (See Comments)    Reaction:  Unknown; childhood reaction  . Icy Hot Rash    Medications Prior to Admission  Medication Sig Dispense Refill Last Dose  . omeprazole (PRILOSEC) 20 MG capsule Take 1 capsule (20 mg total) by mouth  daily. 30 capsule 0   . ondansetron (ZOFRAN ODT) 4 MG disintegrating tablet Take 1 tablet (4 mg total) by mouth every 8 (eight) hours as needed for nausea or vomiting. (Patient not taking: Reported on 08/05/2017) 20 tablet 0 Not Taking  . SPRINTEC 28 0.25-35 MG-MCG tablet TAKE 1 TABLET BY MOUTH EVERY DAY (ONLY TAKE ACTIVE PILLS CONTINUOUSLY) 84 tablet 5 10/14/2017 at Unknown time    Review of Systems  Constitutional: Negative.  Negative for fatigue and fever.  HENT: Negative.   Respiratory: Negative.  Negative for shortness of breath.   Cardiovascular: Negative.  Negative for chest pain.  Gastrointestinal: Positive for abdominal pain. Negative for constipation, diarrhea, nausea and vomiting.  Genitourinary: Positive for vaginal discharge. Negative for dysuria.  Neurological: Negative.  Negative for dizziness and headaches.   Physical Exam   Blood pressure 113/73, pulse 72, temperature 98.2 F (36.8 C), temperature source Oral, resp. rate 16, weight 172 lb (78 kg), last menstrual period 02/22/2018, SpO2 100 %.  Physical  Exam  Nursing note and vitals reviewed. Constitutional: She is oriented to person, place, and time. She appears well-developed and well-nourished. No distress.  HENT:  Head: Normocephalic.  Eyes: Pupils are equal, round, and reactive to light.  Cardiovascular: Normal rate, regular rhythm and normal heart sounds.  Respiratory: Effort normal and breath sounds normal. No respiratory distress.  GI: Soft. Bowel sounds are normal. She exhibits no distension. There is no tenderness.  Genitourinary: Vaginal discharge (thin, foul smelling discharge) found.  Neurological: She is alert and oriented to person, place, and time.  Skin: Skin is warm and dry.  Psychiatric: She has a normal mood and affect. Her behavior is normal. Judgment and thought content normal.    MAU Course  Procedures Results for orders placed or performed during the hospital encounter of 03/11/18 (from the  past 24 hour(s))  Urinalysis, Routine w reflex microscopic     Status: Abnormal   Collection Time: 03/11/18 10:29 AM  Result Value Ref Range   Color, Urine YELLOW YELLOW   APPearance CLOUDY (A) CLEAR   Specific Gravity, Urine 1.014 1.005 - 1.030   pH 9.0 (H) 5.0 - 8.0   Glucose, UA NEGATIVE NEGATIVE mg/dL   Hgb urine dipstick NEGATIVE NEGATIVE   Bilirubin Urine NEGATIVE NEGATIVE   Ketones, ur NEGATIVE NEGATIVE mg/dL   Protein, ur NEGATIVE NEGATIVE mg/dL   Nitrite NEGATIVE NEGATIVE   Leukocytes, UA NEGATIVE NEGATIVE  Wet prep, genital     Status: Abnormal   Collection Time: 03/11/18 10:48 AM  Result Value Ref Range   Yeast Wet Prep HPF POC NONE SEEN NONE SEEN   Trich, Wet Prep NONE SEEN NONE SEEN   Clue Cells Wet Prep HPF POC PRESENT (A) NONE SEEN   WBC, Wet Prep HPF POC MODERATE (A) NONE SEEN   Sperm NONE SEEN   Pregnancy, urine POC     Status: None   Collection Time: 03/11/18 10:50 AM  Result Value Ref Range   Preg Test, Ur NEGATIVE NEGATIVE   MDM UA, UPT Wet prep and gc/chlamydia Toradol 60mg  IM- patient reports relief from pain  Assessment and Plan   1. Bacterial vaginosis    -Discharge home in stable condition -Rx for metronidazole sent to patient's pharmacy -Reviewed no alcohol and BV prevention -Patient advised to follow-up with gyn provider of choice routine care -Patient may return to MAU as needed or if her condition were to change or worsen  Wende Mott CNM 03/11/2018, 10:48 AM

## 2018-03-12 LAB — GC/CHLAMYDIA PROBE AMP (~~LOC~~) NOT AT ARMC
CHLAMYDIA, DNA PROBE: NEGATIVE
NEISSERIA GONORRHEA: NEGATIVE

## 2018-03-22 ENCOUNTER — Encounter (HOSPITAL_COMMUNITY): Payer: Self-pay

## 2018-03-22 ENCOUNTER — Inpatient Hospital Stay (HOSPITAL_COMMUNITY)
Admission: AD | Admit: 2018-03-22 | Discharge: 2018-03-22 | Disposition: A | Discharge status: Home / Self Care | Payer: No Typology Code available for payment source | Source: Ambulatory Visit | Attending: Obstetrics and Gynecology | Admitting: Obstetrics and Gynecology

## 2018-03-22 ENCOUNTER — Inpatient Hospital Stay (HOSPITAL_COMMUNITY): Payer: No Typology Code available for payment source

## 2018-03-22 DIAGNOSIS — R109 Unspecified abdominal pain: Secondary | ICD-10-CM | POA: Diagnosis present

## 2018-03-22 DIAGNOSIS — Z87891 Personal history of nicotine dependence: Secondary | ICD-10-CM | POA: Diagnosis not present

## 2018-03-22 DIAGNOSIS — O26891 Other specified pregnancy related conditions, first trimester: Secondary | ICD-10-CM | POA: Diagnosis not present

## 2018-03-22 DIAGNOSIS — M7918 Myalgia, other site: Secondary | ICD-10-CM | POA: Diagnosis not present

## 2018-03-22 DIAGNOSIS — O283 Abnormal ultrasonic finding on antenatal screening of mother: Secondary | ICD-10-CM | POA: Diagnosis not present

## 2018-03-22 DIAGNOSIS — O26899 Other specified pregnancy related conditions, unspecified trimester: Secondary | ICD-10-CM

## 2018-03-22 DIAGNOSIS — O3680X0 Pregnancy with inconclusive fetal viability, not applicable or unspecified: Secondary | ICD-10-CM

## 2018-03-22 DIAGNOSIS — Z3A01 Less than 8 weeks gestation of pregnancy: Secondary | ICD-10-CM | POA: Diagnosis not present

## 2018-03-22 LAB — CBC
HCT: 40 % (ref 36.0–46.0)
Hemoglobin: 14.4 g/dL (ref 12.0–15.0)
MCH: 35 pg — ABNORMAL HIGH (ref 26.0–34.0)
MCHC: 36 g/dL (ref 30.0–36.0)
MCV: 97.1 fL (ref 78.0–100.0)
PLATELETS: 244 10*3/uL (ref 150–400)
RBC: 4.12 MIL/uL (ref 3.87–5.11)
RDW: 13.2 % (ref 11.5–15.5)
WBC: 8.1 10*3/uL (ref 4.0–10.5)

## 2018-03-22 LAB — URINALYSIS, ROUTINE W REFLEX MICROSCOPIC
BACTERIA UA: NONE SEEN
BILIRUBIN URINE: NEGATIVE
Glucose, UA: NEGATIVE mg/dL
KETONES UR: NEGATIVE mg/dL
LEUKOCYTES UA: NEGATIVE
Nitrite: NEGATIVE
PH: 6 (ref 5.0–8.0)
Protein, ur: NEGATIVE mg/dL
Specific Gravity, Urine: 1.023 (ref 1.005–1.030)

## 2018-03-22 LAB — POCT PREGNANCY, URINE: Preg Test, Ur: POSITIVE — AB

## 2018-03-22 LAB — HCG, QUANTITATIVE, PREGNANCY: hCG, Beta Chain, Quant, S: 339 m[IU]/mL — ABNORMAL HIGH (ref <5)

## 2018-03-22 MED ORDER — ACETAMINOPHEN 500 MG PO TABS
1000.0000 mg | ORAL_TABLET | Freq: Four times a day (QID) | ORAL | Status: DC | PRN
  Administered 2018-03-22: 1000 mg via ORAL
  Filled 2018-03-22: qty 2

## 2018-03-22 NOTE — Discharge Instructions (Signed)
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
## 2018-03-25 ENCOUNTER — Other Ambulatory Visit: Payer: Self-pay

## 2018-03-25 ENCOUNTER — Emergency Department (HOSPITAL_COMMUNITY)
Admission: EM | Admit: 2018-03-25 | Discharge: 2018-03-25 | Disposition: A | Discharge status: Home / Self Care | Payer: Medicaid Other | Attending: Emergency Medicine | Admitting: Emergency Medicine

## 2018-03-25 ENCOUNTER — Ambulatory Visit (INDEPENDENT_AMBULATORY_CARE_PROVIDER_SITE_OTHER): Payer: Self-pay

## 2018-03-25 ENCOUNTER — Encounter (HOSPITAL_COMMUNITY): Payer: Self-pay | Admitting: Emergency Medicine

## 2018-03-25 DIAGNOSIS — O3680X0 Pregnancy with inconclusive fetal viability, not applicable or unspecified: Secondary | ICD-10-CM

## 2018-03-25 DIAGNOSIS — Y929 Unspecified place or not applicable: Secondary | ICD-10-CM | POA: Diagnosis not present

## 2018-03-25 DIAGNOSIS — Z79899 Other long term (current) drug therapy: Secondary | ICD-10-CM | POA: Insufficient documentation

## 2018-03-25 DIAGNOSIS — Z3A00 Weeks of gestation of pregnancy not specified: Secondary | ICD-10-CM | POA: Insufficient documentation

## 2018-03-25 DIAGNOSIS — Y999 Unspecified external cause status: Secondary | ICD-10-CM | POA: Diagnosis not present

## 2018-03-25 DIAGNOSIS — O9A219 Injury, poisoning and certain other consequences of external causes complicating pregnancy, unspecified trimester: Secondary | ICD-10-CM | POA: Diagnosis present

## 2018-03-25 DIAGNOSIS — S46812A Strain of other muscles, fascia and tendons at shoulder and upper arm level, left arm, initial encounter: Secondary | ICD-10-CM | POA: Insufficient documentation

## 2018-03-25 DIAGNOSIS — Z87891 Personal history of nicotine dependence: Secondary | ICD-10-CM | POA: Insufficient documentation

## 2018-03-25 DIAGNOSIS — O283 Abnormal ultrasonic finding on antenatal screening of mother: Secondary | ICD-10-CM

## 2018-03-25 DIAGNOSIS — Y9389 Activity, other specified: Secondary | ICD-10-CM | POA: Insufficient documentation

## 2018-03-25 LAB — HCG, QUANTITATIVE, PREGNANCY: hCG, Beta Chain, Quant, S: 1130 m[IU]/mL — ABNORMAL HIGH (ref <5)

## 2018-03-25 MED ORDER — ACETAMINOPHEN 500 MG PO TABS: 500.0000 mg | ORAL_TABLET | Freq: Four times a day (QID) | ORAL | 0 refills | Status: DC | PRN

## 2018-03-25 NOTE — Progress Notes (Signed)
Pt here today for a STAT beta lab.  Pt denies any pain or bleeding.  Pt informed that she will need to be here for at least two hours to get results and f/u.  Pt stated understanding with no further questions.  Notified Marcille Buffy, CNM pt results.  Provider recommendation to have f/u OB US in 10-14 days.  OB US scheduled for May 14th @ 0945.  Pt notified of [place] appt and provider's recommendation.  Pt verbalized understanding with no further questions.

## 2018-03-25 NOTE — ED Notes (Signed)
See provider assessment 
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
## 2018-03-25 NOTE — Discharge Instructions (Signed)
You may take Tylenol as prescribed for pain control.  Continue to alternate ice and heat pads to areas of pain to limit swelling and muscle spasm.  Continue with follow-up at [facility] today as scheduled.  You may return to the emergency department for new or concerning symptoms.
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
## 2018-03-26 NOTE — Progress Notes (Signed)
I have reviewed the chart and agree with nursing staff's documentation of this patient's encounter.  Marcille Buffy, CNM 03/26/2018 12:13 PM

## 2018-03-27 ENCOUNTER — Inpatient Hospital Stay (HOSPITAL_COMMUNITY)
Admission: AD | Admit: 2018-03-27 | Discharge: 2018-03-27 | Disposition: A | Discharge status: Home / Self Care | Payer: No Typology Code available for payment source | Source: Ambulatory Visit | Attending: Obstetrics & Gynecology | Admitting: Obstetrics & Gynecology

## 2018-03-27 ENCOUNTER — Inpatient Hospital Stay (HOSPITAL_COMMUNITY): Payer: No Typology Code available for payment source

## 2018-03-27 ENCOUNTER — Encounter (HOSPITAL_COMMUNITY): Payer: Self-pay | Admitting: *Deleted

## 2018-03-27 DIAGNOSIS — B9689 Other specified bacterial agents as the cause of diseases classified elsewhere: Secondary | ICD-10-CM

## 2018-03-27 DIAGNOSIS — Z3A01 Less than 8 weeks gestation of pregnancy: Secondary | ICD-10-CM | POA: Insufficient documentation

## 2018-03-27 DIAGNOSIS — O26851 Spotting complicating pregnancy, first trimester: Secondary | ICD-10-CM | POA: Diagnosis present

## 2018-03-27 DIAGNOSIS — N760 Acute vaginitis: Secondary | ICD-10-CM | POA: Diagnosis not present

## 2018-03-27 DIAGNOSIS — Z87891 Personal history of nicotine dependence: Secondary | ICD-10-CM | POA: Insufficient documentation

## 2018-03-27 DIAGNOSIS — O23591 Infection of other part of genital tract in pregnancy, first trimester: Secondary | ICD-10-CM | POA: Insufficient documentation

## 2018-03-27 DIAGNOSIS — O209 Hemorrhage in early pregnancy, unspecified: Secondary | ICD-10-CM | POA: Diagnosis not present

## 2018-03-27 LAB — URINALYSIS, ROUTINE W REFLEX MICROSCOPIC
Bilirubin Urine: NEGATIVE
GLUCOSE, UA: NEGATIVE mg/dL
KETONES UR: 5 mg/dL — AB
LEUKOCYTES UA: NEGATIVE
NITRITE: NEGATIVE
PH: 5 (ref 5.0–8.0)
PROTEIN: NEGATIVE mg/dL
Specific Gravity, Urine: 1.02 (ref 1.005–1.030)

## 2018-03-27 LAB — WET PREP, GENITAL
Sperm: NONE SEEN
Trich, Wet Prep: NONE SEEN
Yeast Wet Prep HPF POC: NONE SEEN

## 2018-03-27 LAB — HCG, QUANTITATIVE, PREGNANCY: HCG, BETA CHAIN, QUANT, S: 2773 m[IU]/mL — AB (ref <5)

## 2018-03-27 MED ORDER — METRONIDAZOLE 500 MG PO TABS: 500.0000 mg | ORAL_TABLET | Freq: Two times a day (BID) | ORAL | 2 refills | Status: DC

## 2018-03-27 NOTE — MAU Note (Signed)
Pt presents to MAU with complaints of vaginal spotting this morning. Was treated for BV the second or third week of April

## 2018-03-27 NOTE — Discharge Instructions (Signed)
Some natural remedies/prevention to try for bacterial vaginosis: °Take a probiotic tablet/capsule every day for at least 1-2 months.   °Whenever you have symptoms, use boric acid suppositories vaginally every night for a week.   °Do not use scented soaps/perfumes in the vaginal area and wear breathable cotton underwear and do not wear tight restrictive clothing. ° ° °

## 2018-03-27 NOTE — Progress Notes (Signed)
Blind swabs for GC/Chlamydia & wet prep done by L.  Leftwich-Kirby, CNM

## 2018-03-27 NOTE — MAU Provider Note (Signed)
Chief Complaint: Vaginal Bleeding   First Provider Initiated Contact with Patient 03/27/18 1858      SUBJECTIVE HPI: Kim Johns is a 27 y.o. G3P1101 at [redacted]w[redacted]d by LMP who presents to maternity admissions reporting light spotting when wiping and in her underwear today.  There are no associated symptoms. She has not tried any treatments.  She was seen on 4/28 in MAU and had hcg level of 339 with US showing no IUP or ectopic.  She had an appropriate rise in hcg to 1130 on 5/1 and has outpatient [place] scheduled on 5/14. The bleeding was new today so she came in to be evaluated.     HPI  Past Medical History:  Diagnosis Date  . Abscess of breast   . Allergy   . Anxiety   . Asthma    albuterol last used a few months ago  . HSV-2 (herpes simplex virus 2) infection   . Pelvic inflammatory disease   . Preterm labor    Past Surgical History:  Procedure Laterality Date  . CERVICAL CERCLAGE N/A 04/05/2015   Procedure: CERCLAGE CERVICAL;  Surgeon: Lavonia Drafts, MD;  Location: [facility];  Service: Gynecology;  Laterality: N/A;  . WISDOM TOOTH EXTRACTION     Social History   Socioeconomic History  . Marital status: Single    Spouse name: Not on file  . Number of children: Not on file  . Years of education: Not on file  . Highest education level: Not on file  Occupational History  . Not on file  Social Needs  . Financial resource strain: Not on file  . Food insecurity:    Worry: Not on file    Inability: Not on file  . Transportation needs:    Medical: Not on file    Non-medical: Not on file  Tobacco Use  . Smoking status: Former Smoker    Packs/day: 0.50    Years: 10.00    Pack years: 5.00    Types: Cigarettes  . Smokeless tobacco: Never Used  Substance and Sexual Activity  . Alcohol use: Yes    Alcohol/week: 0.0 oz    Comment: occ  . Drug use: No  . Sexual activity: Yes    Birth control/protection: None  Lifestyle  . Physical activity:    Days per week: Not on  file    Minutes per session: Not on file  . Stress: Not on file  Relationships  . Social connections:    Talks on phone: Not on file    Gets together: Not on file    Attends religious service: Not on file    Active member of club or organization: Not on file    Attends meetings of clubs or organizations: Not on file    Relationship status: Not on file  . Intimate partner violence:    Fear of current or ex partner: Not on file    Emotionally abused: Not on file    Physically abused: Not on file    Forced sexual activity: Not on file  Other Topics Concern  . Not on file  Social History Narrative  . Not on file   No current facility-administered medications on file prior to encounter.    Current Outpatient Medications on File Prior to Encounter  Medication Sig Dispense Refill  . Prenatal Vit-Fe Fumarate-FA (PRENATAL MULTIVITAMIN) TABS tablet Take 1 tablet by mouth daily at 12 noon.    . valACYclovir (VALTREX) 1000 MG tablet Take 1 tablet (1,000 mg total)  by mouth daily. (Patient taking differently: Take 1,000 mg by mouth daily as needed (flare up). ) 5 tablet 3  . omeprazole (PRILOSEC) 20 MG capsule Take 1 capsule (20 mg total) by mouth daily. (Patient not taking: Reported on 03/27/2018) 30 capsule 0   Allergies  Allergen Reactions  . Shellfish Allergy Hives  . Sulfa Antibiotics Other (See Comments)    Reaction:  Unknown; childhood reaction  . Icy Hot Rash    ROS:  Review of Systems  Constitutional: Negative for chills, fatigue and fever.  Respiratory: Negative for shortness of breath.   Cardiovascular: Negative for chest pain.  Gastrointestinal: Negative for constipation, nausea and vomiting.  Genitourinary: Positive for vaginal bleeding. Negative for difficulty urinating, dysuria, flank pain, pelvic pain, vaginal discharge and vaginal pain.  Neurological: Negative for dizziness and headaches.  Psychiatric/Behavioral: Negative.      I have reviewed patient's Past Medical  Hx, Surgical Hx, Family Hx, Social Hx, medications and allergies.   Physical Exam   Patient Vitals for the past 24 hrs:  BP Temp Pulse Resp Height Weight  03/27/18 2000 (!) 117/58 - 61 - - -  03/27/18 1710 137/78 98.9 F (37.2 C) (!) 57 16 5\' 5"  (1.651 m) 169 lb (76.7 kg)   Constitutional: Well-developed, well-nourished female in no acute distress.  Cardiovascular: normal rate Respiratory: normal effort GI: Abd soft, non-tender. Pos BS x 4 MS: Extremities nontender, no edema, normal ROM Neurologic: Alert and oriented x 4.  GU: Neg CVAT.  PELVIC EXAM: Deferred, wet prep/GCC collected by blind swab.  Pad visualized with no bleeding in 30 minutes in MAU   LAB RESULTS Results for orders placed or performed during the hospital encounter of 03/27/18 (from the past 24 hour(s))  Urinalysis, Routine w reflex microscopic     Status: Abnormal   Collection Time: 03/27/18  5:15 PM  Result Value Ref Range   Color, Urine YELLOW YELLOW   APPearance CLEAR CLEAR   Specific Gravity, Urine 1.020 1.005 - 1.030   pH 5.0 5.0 - 8.0   Glucose, UA NEGATIVE NEGATIVE mg/dL   Hgb urine dipstick SMALL (A) NEGATIVE   Bilirubin Urine NEGATIVE NEGATIVE   Ketones, ur 5 (A) NEGATIVE mg/dL   Protein, ur NEGATIVE NEGATIVE mg/dL   Nitrite NEGATIVE NEGATIVE   Leukocytes, UA NEGATIVE NEGATIVE   RBC / HPF 0-5 0 - 5 RBC/hpf   WBC, UA 0-5 0 - 5 WBC/hpf   Bacteria, UA RARE (A) NONE SEEN   Squamous Epithelial / LPF 0-5 0 - 5   Mucus PRESENT   Wet prep, genital     Status: Abnormal   Collection Time: 03/27/18  7:03 PM  Result Value Ref Range   Yeast Wet Prep HPF POC NONE SEEN NONE SEEN   Trich, Wet Prep NONE SEEN NONE SEEN   Clue Cells Wet Prep HPF POC PRESENT (A) NONE SEEN   WBC, Wet Prep HPF POC MANY (A) NONE SEEN   Sperm NONE SEEN   hCG, quantitative, pregnancy     Status: Abnormal   Collection Time: 03/27/18  8:06 PM  Result Value Ref Range   hCG, Beta Chain, Quant, S 2,773 (H) <5 mIU/mL        IMAGING US Ob Transvaginal  Result Date: 03/27/2018 CLINICAL DATA:  Vaginal bleeding.  Follow-up OB ultrasound. EXAM: OBSTETRIC <14 WK [place] AND TRANSVAGINAL OB US TECHNIQUE: Both transabdominal and transvaginal ultrasound examinations were performed for complete evaluation of the gestation as well as the maternal uterus,  adnexal regions, and pelvic cul-de-sac. Transvaginal technique was performed to assess early pregnancy. COMPARISON:  None. FINDINGS: Intrauterine gestational sac: Present Yolk sac: Small nodular density along the wall of the above sac suggests a small yolk sac. Embryo:  Not identified Cardiac Activity: Not done MSD: 4.6 mm   5 w   1 d Subchorionic hemorrhage:  None visualized. Maternal uterus/adnexae: Normal ovaries.  Trace free fluid IMPRESSION: Probable early intrauterine gestational sac, but no clear yolk sac, fetal pole, or cardiac activity yet visualized. Recommend follow-up quantitative B-HCG levels and follow-up US in 14 days to assess viability. This recommendation follows SRU consensus guidelines: Diagnostic Criteria for Nonviable Pregnancy Early in the First Trimester. [place] [employer] Med 2013; 623:7628-31. Electronically Signed   By: Suzy Bouchard M.D.   On: 03/27/2018 19:37   US Ob Less Than 14 Weeks With Ob Transvaginal  Result Date: 03/22/2018 CLINICAL DATA:  Pregnant patient with abdominal cramping. EXAM: OBSTETRIC <14 WK [place] AND TRANSVAGINAL OB US TECHNIQUE: Both transabdominal and transvaginal ultrasound examinations were performed for complete evaluation of the gestation as well as the maternal uterus, adnexal regions, and pelvic cul-de-sac. Transvaginal technique was performed to assess early pregnancy. COMPARISON:  None. FINDINGS: Intrauterine gestational sac: None Yolk sac:  Not Visualized. Embryo:  Not Visualized. Cardiac Activity: Not Visualized. Maternal uterus/adnexae: Normal right and left ovaries. Probable right ovarian corpus luteum. Small amount of free fluid adjacent  to the right ovary. IMPRESSION: No intrauterine gestation identified. In the setting of positive pregnancy test and no definite intrauterine pregnancy, this reflects a pregnancy of unknown location. Differential considerations include early normal IUP, abnormal IUP, or nonvisualized ectopic pregnancy. Differentiation is achieved with serial beta HCG supplemented by repeat sonography as clinically warranted. Electronically Signed   By: Lovey Newcomer M.D.   On: 03/22/2018 23:51    MAU Management/MDM: Ordered labs and [place] and reviewed results.  Hcg with appropriate rise in 48 hours again, [place] with gestational sac but no sure yolk sac yet.  Pt with evidence of BV so will treat with Flagyl.  Discussed prevention of BV including probiotics.  Consult Dr Harolyn Rutherford.  D/C home with ectopic precautions reviewed/reasons to return.  Pt to keep scheduled outpatient [place] in 1 week.  Pt discharged with strict return precautions.  ASSESSMENT 1. BV (bacterial vaginosis)   2. Vaginal bleeding in pregnancy, first trimester     PLAN Discharge home Allergies as of 03/27/2018      Reactions   Shellfish Allergy Hives   Sulfa Antibiotics Other (See Comments)   Reaction:  Unknown; childhood reaction   Icy Hot Rash      Medication List    TAKE these medications   acetaminophen 500 MG tablet Commonly known as:  TYLENOL Take 1 tablet (500 mg total) by mouth every 6 (six) hours as needed. What changed:  reasons to take this   metroNIDAZOLE 500 MG tablet Commonly known as:  FLAGYL Take 1 tablet (500 mg total) by mouth 2 (two) times daily.   omeprazole 20 MG capsule Commonly known as:  PRILOSEC Take 1 capsule (20 mg total) by mouth daily.   prenatal multivitamin Tabs tablet Take 1 tablet by mouth daily at 12 noon.   valACYclovir 1000 MG tablet Commonly known as:  VALTREX Take 1 tablet (1,000 mg total) by mouth daily. What changed:    when to take this  reasons to take this      [place]  ULTR[facility]OUND Follow up.   Specialty:  Radiology Why:  As scheduled on 04/07/18. Return to MAU as needed for emergencies. Contact information: [address] [id number] [place] Waldo Cowan Certified Nurse-Midwife 03/27/2018  9:23 PM

## 2018-03-30 LAB — GC/CHLAMYDIA PROBE AMP (~~LOC~~) NOT AT ARMC
Chlamydia: NEGATIVE
NEISSERIA GONORRHEA: NEGATIVE

## 2018-04-03 ENCOUNTER — Inpatient Hospital Stay (HOSPITAL_COMMUNITY): Payer: Medicaid Other

## 2018-04-03 ENCOUNTER — Encounter (HOSPITAL_COMMUNITY): Payer: Self-pay

## 2018-04-03 ENCOUNTER — Inpatient Hospital Stay (HOSPITAL_COMMUNITY)
Admission: AD | Admit: 2018-04-03 | Discharge: 2018-04-03 | Disposition: A | Discharge status: Home / Self Care | Payer: Medicaid Other | Source: Ambulatory Visit | Attending: Obstetrics & Gynecology | Admitting: Obstetrics & Gynecology

## 2018-04-03 DIAGNOSIS — O208 Other hemorrhage in early pregnancy: Secondary | ICD-10-CM

## 2018-04-03 DIAGNOSIS — Z833 Family history of diabetes mellitus: Secondary | ICD-10-CM | POA: Diagnosis not present

## 2018-04-03 DIAGNOSIS — Z882 Allergy status to sulfonamides status: Secondary | ICD-10-CM | POA: Diagnosis not present

## 2018-04-03 DIAGNOSIS — O209 Hemorrhage in early pregnancy, unspecified: Secondary | ICD-10-CM

## 2018-04-03 DIAGNOSIS — D251 Intramural leiomyoma of uterus: Secondary | ICD-10-CM | POA: Diagnosis not present

## 2018-04-03 DIAGNOSIS — Z91013 Allergy to seafood: Secondary | ICD-10-CM | POA: Insufficient documentation

## 2018-04-03 DIAGNOSIS — Z8249 Family history of ischemic heart disease and other diseases of the circulatory system: Secondary | ICD-10-CM | POA: Insufficient documentation

## 2018-04-03 DIAGNOSIS — N939 Abnormal uterine and vaginal bleeding, unspecified: Secondary | ICD-10-CM | POA: Diagnosis present

## 2018-04-03 DIAGNOSIS — Z823 Family history of stroke: Secondary | ICD-10-CM | POA: Insufficient documentation

## 2018-04-03 DIAGNOSIS — D259 Leiomyoma of uterus, unspecified: Secondary | ICD-10-CM | POA: Diagnosis not present

## 2018-04-03 DIAGNOSIS — R109 Unspecified abdominal pain: Secondary | ICD-10-CM | POA: Insufficient documentation

## 2018-04-03 DIAGNOSIS — O3411 Maternal care for benign tumor of corpus uteri, first trimester: Secondary | ICD-10-CM | POA: Diagnosis not present

## 2018-04-03 DIAGNOSIS — Z3A01 Less than 8 weeks gestation of pregnancy: Secondary | ICD-10-CM | POA: Diagnosis not present

## 2018-04-03 DIAGNOSIS — Z87891 Personal history of nicotine dependence: Secondary | ICD-10-CM | POA: Diagnosis not present

## 2018-04-03 DIAGNOSIS — O26891 Other specified pregnancy related conditions, first trimester: Secondary | ICD-10-CM | POA: Insufficient documentation

## 2018-04-03 NOTE — Discharge Instructions (Signed)
Uterine Fibroids Uterine fibroids are tissue masses (tumors) that can develop in the womb (uterus). They are also called leiomyomas. This type of tumor is not cancerous (benign) and does not spread to other parts of the body outside of the pelvic area, which is between the hip bones. Occasionally, fibroids may develop in the fallopian tubes, in the cervix, or on the support structures (ligaments) that surround the uterus. You can have one or many fibroids. Fibroids can vary in size, weight, and where they grow in the uterus. Some can become quite large. Most fibroids do not require medical treatment. What are the causes? A fibroid can develop when a single uterine cell keeps growing (replicating). Most cells in the human body have a control mechanism that keeps them from replicating without control. What are the signs or symptoms? Symptoms may include:  Heavy bleeding during your period.  Bleeding or spotting between periods.  Pelvic pain and pressure.  Bladder problems, such as needing to urinate more often (urinary frequency) or urgently.  Inability to reproduce offspring (infertility).  Miscarriages.  How is this diagnosed? Uterine fibroids are diagnosed through a physical exam. Your health care provider may feel the lumpy tumors during a pelvic exam. Ultrasonography and an MRI may be done to determine the size, location, and number of fibroids. How is this treated? Treatment may include:  Watchful waiting. This involves getting the fibroid checked by your health care provider to see if it grows or shrinks. Follow your health care provider's recommendations for how often to have this checked.  Hormone medicines. These can be taken by mouth or given through an intrauterine device (IUD).  Surgery. ? Removing the fibroids (myomectomy) or the uterus (hysterectomy). ? Removing blood supply to the fibroids (uterine artery embolization).  If fibroids interfere with your fertility and you  want to become pregnant, your health care provider may recommend having the fibroids removed. Follow these instructions at home:  Keep all follow-up visits as directed by your health care provider. This is important.  Take over-the-counter and prescription medicines only as told by your health care provider. ? If you were prescribed a hormone treatment, take the hormone medicines exactly as directed.  Ask your health care provider about taking iron pills and increasing the amount of dark green, leafy vegetables in your diet. These actions can help to boost your blood iron levels, which may be affected by heavy menstrual bleeding.  Pay close attention to your period and tell your health care provider about any changes, such as: ? Increased blood flow that requires you to use more pads or tampons than usual per month. ? A change in the number of days that your period lasts per month. ? A change in symptoms that are associated with your period, such as abdominal cramping or back pain. Contact a health care provider if:  You have pelvic pain, back pain, or abdominal cramps that cannot be controlled with medicines.  You have an increase in bleeding between and during periods.  You soak tampons or pads in a half hour or less.  You feel lightheaded, extra tired, or weak. Get help right away if:  You faint.  You have a sudden increase in pelvic pain. This information is not intended to replace advice given to you by your health care provider. Make sure you discuss any questions you have with your health care provider. Document Released: 11/08/2000 Document Revised: 07/11/2016 Document Reviewed: 05/10/2014 Elsevier Interactive Patient Education  2018 [employer]  

## 2018-04-03 NOTE — MAU Provider Note (Signed)
History     CSN: [id number]  Arrival date and time: 04/03/18 8182   First Provider Initiated Contact with Patient 04/03/18 [phone]      Chief Complaint  Patient presents with  . Vaginal Bleeding   HPI  Kim Johns is a 27 y.o. G3P1101 at [redacted]w[redacted]d by LMP who presents with vaginal bleeding. Was seen for same complaint last week. Has had appropriately rise hcgs; ultrasound 1 week ago showed IUGS and a questionable yolk sac. Pt scheduled for OP ultrasound next week for viability.  States bleeding previously stopped until this morning. Noted bright red blood on toilet paper this morning. Endorses lower abdominal cramping that she rates 2/10. Has not treated symptoms. Denies vaginal discharge, dysuria, n/v/d/c, or recent intercourse.   OB History    Gravida  3   Para  2   Term  1   Preterm  1   AB  0   Living  1     SAB  0   TAB  0   Ectopic  0   Multiple  0   Live Births  1           Past Medical History:  Diagnosis Date  . Abscess of breast   . Allergy   . Anxiety   . Asthma    albuterol last used a few months ago  . HSV-2 (herpes simplex virus 2) infection   . Pelvic inflammatory disease   . Preterm labor     Past Surgical History:  Procedure Laterality Date  . CERVICAL CERCLAGE N/A 04/05/2015   Procedure: CERCLAGE CERVICAL;  Surgeon: Lavonia Drafts, MD;  Location: [facility];  Service: Gynecology;  Laterality: N/A;  . WISDOM TOOTH EXTRACTION      Family History  Problem Relation Age of Onset  . Diabetes Mother   . Stroke Mother   . Hypertension Father   . Heart attack Father   . Heart disease Father     Social History   Tobacco Use  . Smoking status: Former Smoker    Packs/day: 0.50    Years: 10.00    Pack years: 5.00    Types: Cigarettes  . Smokeless tobacco: Never Used  Substance Use Topics  . Alcohol use: Yes    Alcohol/week: 0.0 oz    Comment: occ  . Drug use: No    Allergies:  Allergies  Allergen Reactions  . Shellfish  Allergy Hives  . Sulfa Antibiotics Other (See Comments)    Reaction:  Unknown; childhood reaction  . Icy Hot Rash    Medications Prior to Admission  Medication Sig Dispense Refill Last Dose  . acetaminophen (TYLENOL) 500 MG tablet Take 1 tablet (500 mg total) by mouth every 6 (six) hours as needed. (Patient taking differently: Take 500 mg by mouth every 6 (six) hours as needed for moderate pain. ) 30 tablet 0 03/26/2018 at Unknown time  . metroNIDAZOLE (FLAGYL) 500 MG tablet Take 1 tablet (500 mg total) by mouth 2 (two) times daily. 14 tablet 2   . omeprazole (PRILOSEC) 20 MG capsule Take 1 capsule (20 mg total) by mouth daily. (Patient not taking: Reported on 03/27/2018) 30 capsule 0 Not Taking at Unknown time  . Prenatal Vit-Fe Fumarate-FA (PRENATAL MULTIVITAMIN) TABS tablet Take 1 tablet by mouth daily at 12 noon.   03/26/2018 at Unknown time  . valACYclovir (VALTREX) 1000 MG tablet Take 1 tablet (1,000 mg total) by mouth daily. (Patient taking differently: Take 1,000 mg by mouth daily as  needed (flare up). ) 5 tablet 3 year at Unknown time    Review of Systems  Constitutional: Negative.   Gastrointestinal: Positive for abdominal pain. Negative for constipation, diarrhea, nausea and vomiting.  Genitourinary: Positive for vaginal bleeding. Negative for dysuria.   Physical Exam   Blood pressure 112/67, pulse 68, temperature 98.6 F (37 C), resp. rate 16, height 5\' 5"  (1.651 m), weight 170 lb (77.1 kg), last menstrual period 02/22/2018, SpO2 100 %.  Physical Exam  Nursing note and vitals reviewed. Constitutional: She is oriented to person, place, and time. She appears well-developed and well-nourished. No distress.  HENT:  Head: Normocephalic and atraumatic.  Eyes: Conjunctivae are normal. Right eye exhibits no discharge. Left eye exhibits no discharge. No scleral icterus.  Neck: Normal range of motion.  Respiratory: Effort normal. No respiratory distress.  GI: Soft. There is no  tenderness.  Genitourinary:  Genitourinary Comments: Small smears of dark red/brown blood on pad. No active bleeding.   Neurological: She is alert and oriented to person, place, and time.  Skin: Skin is warm and dry. She is not diaphoretic.  Psychiatric: She has a normal mood and affect. Her behavior is normal. Judgment and thought content normal.    MAU Course  Procedures No results found for this or any previous visit (from the past 24 hour(s)). US Ob Transvaginal  Result Date: 04/03/2018 CLINICAL DATA:  Initial evaluation for vaginal bleeding, early pregnancy EXAM: OBSTETRIC <14 WK [place] AND TRANSVAGINAL OB US TECHNIQUE: Both transabdominal and transvaginal ultrasound examinations were performed for complete evaluation of the gestation as well as the maternal uterus, adnexal regions, and pelvic cul-de-sac. Transvaginal technique was performed to assess early pregnancy. COMPARISON:  Prior ultrasound from 03/27/2018. FINDINGS: Intrauterine gestational sac: Single Yolk sac:  Present Embryo:  Not visualized. Cardiac Activity: N/A Heart Rate: N/A  bpm MSD: 12.2 mm   6 w   0 d Subchorionic hemorrhage:  None visualized. Maternal uterus/adnexae: Ovaries are normal in appearance bilaterally. Small intramural fibroid measuring 1.0 x 0.7 x 1.1 cm noted at the lower uterine segment. No free fluid. IMPRESSION: 1. Early intrauterine gestational sac with internal yolk sac, but no fetal pole, or cardiac activity yet visualized. Recommend follow-up quantitative B-HCG levels and follow-up US in 14 days to assess viability as clinically warranted. 2. No other acute maternal uterine or adnexal abnormality identified. 3. Small 1.1 cm uterine fibroid. Electronically Signed   By: Jeannine Boga M.D.   On: 04/03/2018 06:46    MDM O positive blood type Ultrasound shows IUGS with yolk sac -- appropriate growth since previous study. 1 cm uterine fibroid in LUS.   Assessment and Plan  A: 1. Uterine fibroids  affecting pregnancy in first trimester   2. Vaginal bleeding affecting early pregnancy    P: Discharge home F/u for prenatal care Discussed reasons to return to [place] 04/03/2018, 5:57 AM

## 2018-04-03 NOTE — MAU Note (Signed)
Pt here for vaginal bleeding that she noticed when she wiped after using the bathroom around 430. States it is a little more than the other day. Pt reports some mild cramping-rates 2/10

## 2018-04-07 ENCOUNTER — Ambulatory Visit (HOSPITAL_COMMUNITY)
Admission: RE | Admit: 2018-04-07 | Discharge: 2018-04-07 | Disposition: A | Discharge status: Home / Self Care | Payer: Medicaid Other | Source: Ambulatory Visit | Attending: Advanced Practice Midwife | Admitting: Advanced Practice Midwife

## 2018-04-07 ENCOUNTER — Ambulatory Visit (INDEPENDENT_AMBULATORY_CARE_PROVIDER_SITE_OTHER): Payer: Medicaid Other | Admitting: *Deleted

## 2018-04-07 DIAGNOSIS — Z3A01 Less than 8 weeks gestation of pregnancy: Secondary | ICD-10-CM | POA: Insufficient documentation

## 2018-04-07 DIAGNOSIS — O3680X0 Pregnancy with inconclusive fetal viability, not applicable or unspecified: Secondary | ICD-10-CM

## 2018-04-07 DIAGNOSIS — Z712 Person consulting for explanation of examination or test findings: Secondary | ICD-10-CM

## 2018-04-07 DIAGNOSIS — O283 Abnormal ultrasonic finding on antenatal screening of mother: Secondary | ICD-10-CM | POA: Diagnosis not present

## 2018-04-07 NOTE — Progress Notes (Signed)
[place] results reviewed by Dr. Gerarda Fraction.  Pt informed of results showing viable intrauterine pregnancy. [place] dates are consistent with EDD by LMP. Pt voiced understanding and has New Ob appt scheduled on 5/28. Medication reconciliation completed.

## 2018-04-07 NOTE — Progress Notes (Signed)
Chart reviewed for nurse visit. Agree with plan of care.   Katheren Shams, DO 04/07/2018 3:49 PM

## 2018-04-21 ENCOUNTER — Encounter: Payer: Self-pay | Admitting: Family Medicine

## 2018-04-21 ENCOUNTER — Other Ambulatory Visit (HOSPITAL_COMMUNITY)
Admission: RE | Admit: 2018-04-21 | Discharge: 2018-04-21 | Disposition: A | Discharge status: Home / Self Care | Payer: Medicaid Other | Source: Ambulatory Visit | Attending: Family Medicine | Admitting: Family Medicine

## 2018-04-21 ENCOUNTER — Ambulatory Visit (INDEPENDENT_AMBULATORY_CARE_PROVIDER_SITE_OTHER): Payer: Medicaid Other | Admitting: Family Medicine

## 2018-04-21 ENCOUNTER — Encounter (HOSPITAL_COMMUNITY): Payer: Self-pay

## 2018-04-21 VITALS — BP 114/71 | HR 58 | Wt 175.8 lb

## 2018-04-21 DIAGNOSIS — Z3480 Encounter for supervision of other normal pregnancy, unspecified trimester: Secondary | ICD-10-CM | POA: Diagnosis present

## 2018-04-21 DIAGNOSIS — O09299 Supervision of pregnancy with other poor reproductive or obstetric history, unspecified trimester: Secondary | ICD-10-CM

## 2018-04-21 DIAGNOSIS — Z3400 Encounter for supervision of normal first pregnancy, unspecified trimester: Secondary | ICD-10-CM

## 2018-04-21 DIAGNOSIS — O0991 Supervision of high risk pregnancy, unspecified, first trimester: Secondary | ICD-10-CM

## 2018-04-21 DIAGNOSIS — B373 Candidiasis of vulva and vagina: Secondary | ICD-10-CM

## 2018-04-21 DIAGNOSIS — B009 Herpesviral infection, unspecified: Secondary | ICD-10-CM

## 2018-04-21 DIAGNOSIS — O0990 Supervision of high risk pregnancy, unspecified, unspecified trimester: Secondary | ICD-10-CM | POA: Insufficient documentation

## 2018-04-21 DIAGNOSIS — J45909 Unspecified asthma, uncomplicated: Secondary | ICD-10-CM | POA: Insufficient documentation

## 2018-04-21 DIAGNOSIS — J4520 Mild intermittent asthma, uncomplicated: Secondary | ICD-10-CM

## 2018-04-21 DIAGNOSIS — B3731 Acute candidiasis of vulva and vagina: Secondary | ICD-10-CM

## 2018-04-21 DIAGNOSIS — O09291 Supervision of pregnancy with other poor reproductive or obstetric history, first trimester: Secondary | ICD-10-CM

## 2018-04-21 MED ORDER — HYDROXYPROGESTERONE CAPROATE 250 MG/ML IM OIL
250.0000 mg | TOPICAL_OIL | Freq: Once | INTRAMUSCULAR | 0 refills | Status: DC
Start: 2018-04-21 — End: 2018-11-24

## 2018-04-21 MED ORDER — TERCONAZOLE 0.8 % VA CREA
1.0000 | TOPICAL_CREAM | Freq: Every day | VAGINAL | 0 refills | Status: DC
Start: 2018-04-21 — End: 2018-04-27

## 2018-04-21 NOTE — Progress Notes (Signed)
Subjective:    Kim Johns is a G7P1101 [redacted]w[redacted]d being seen today for her first obstetrical visit.  Her obstetrical history is significant for second trimester PPROM followed shortened cervix with rescue cerclage and 17 P resulting in term delivery last pregnancy. Patient does intend to breast feed. Pregnancy history fully reviewed.  Patient reports no complaints.  Vitals:   04/21/18 1040  BP: 114/71  Pulse: (!) 58  Weight: 175 lb 12.8 oz (79.7 kg)    HISTORY: OB History  Gravida Para Term Preterm AB Living  3 2 1 1  0 1  SAB TAB Ectopic Multiple Live Births  0 0 0 0 1    # Outcome Date GA Lbr Len/2nd Weight Sex Delivery Anes PTL Lv  3 Current           2 Term 08/23/15 [redacted]w[redacted]d 23:00 / 00:27 8 lb 0.6 oz (3.646 kg) M Vag-Spont EPI  LIV  1 Preterm 06/17/14 [redacted]w[redacted]d 03:59 0 lb (0 kg) F  None  FD     Complications: Premature rupture of membranes in second trimester   Past Medical History:  Diagnosis Date  . Abscess of breast   . Allergy   . Anxiety   . Asthma    albuterol last used a few months ago  . Bipolar 1 disorder ([place])   . HSV-2 (herpes simplex virus 2) infection   . Pelvic inflammatory disease   . Preterm labor    Past Surgical History:  Procedure Laterality Date  . CERVICAL CERCLAGE N/A 04/05/2015   Procedure: CERCLAGE CERVICAL;  Surgeon: Lavonia Drafts, MD;  Location: [facility];  Service: Gynecology;  Laterality: N/A;  . WISDOM TOOTH EXTRACTION     Family History  Problem Relation Age of Onset  . Diabetes Mother   . Stroke Mother   . Hypertension Father   . Heart attack Father   . Heart disease Father      Exam    Uterus:   8 wk size  Pelvic Exam:    Perineum: Normal Perineum   Vulva: Bartholin's, Urethra, Skene's normal   Vagina:  normal mucosa, curdlike discharge   Cervix: multiparous appearance, no bleeding following Pap, no cervical motion tenderness and no lesions   Adnexa: normal adnexa and no mass, fullness, tenderness   Bony Pelvis:  average  System: Breast:  normal appearance, no masses or tenderness   Skin: normal coloration and turgor, no rashes    Neurologic: oriented   Extremities: normal strength, tone, and muscle mass, no evidence of joint instability   HEENT extra ocular movement intact   Mouth/Teeth mucous membranes moist, pharynx normal without lesions and dental hygiene good   Neck supple   Cardiovascular: regular rate and rhythm, no murmurs or gallops   Respiratory:  appears well, vitals normal, no respiratory distress, acyanotic, normal RR, ear and throat exam is normal, neck free of mass or lymphadenopathy, chest clear, no wheezing, crepitations, rhonchi, normal symmetric air entry   Abdomen: soft, non-tender; bowel sounds normal; no masses,  no organomegaly  Pt informed that the ultrasound is considered a limited OB ultrasound and is not intended to be a complete ultrasound exam.  Patient also informed that the ultrasound is not being completed with the intent of assessing for fetal or placental anomalies or any pelvic abnormalities.  Explained that the purpose of today's ultrasound is to assess for  viability.  Patient acknowledges the purpose of the exam and the limitations of the study.   Viable IUP noted  with + flicker    /Plan:    Pregnancy: G3P1101  1. Supervision of other normal pregnancy, antepartum Genetic testing at next visit - [facility] AMB BABYSCRIPTS OPT IN - Culture, OB Urine - Cystic fibrosis gene test - Cytology - PAP - Obstetric Panel, Including HIV - SMN1 COPY NUMBER ANALYSIS (SMA Carrier Screen) - Hemoglobin A1c - Hemoglobinopathy Evaluation  2. History of cervical incompetence in pregnancy, currently pregnant Book for cerclage at end of 1st trimester--scheduled - hydroxyprogesterone caproate (MAKENA) 250 mg/mL OIL injection; Inject 1 mL (250 mg total) into the muscle once for 1 dose.  Dispense: 1 mL; Refill: 0  3. Yeast vaginitis Treated presumptively - terconazole (TERAZOL 3)  0.8 % vaginal cream; Place 1 applicator vaginally at bedtime.  Dispense: 20 g; Refill: 0  Return in 1 month (on 05/19/2018).  Donnamae Jude 04/21/2018

## 2018-04-21 NOTE — Patient Instructions (Signed)
First Trimester of Pregnancy The first trimester of pregnancy is from week 1 until the end of week 13 (months 1 through 3). A week after a sperm fertilizes an egg, the egg will implant on the wall of the uterus. This embryo will begin to develop into a baby. Genes from you and your partner will form the baby. The female genes will determine whether the baby will be a boy or a girl. At 6-8 weeks, the eyes and face will be formed, and the heartbeat can be seen on ultrasound. At the end of 12 weeks, all the baby's organs will be formed. Now that you are pregnant, you will want to do everything you can to have a healthy baby. Two of the most important things are to get good prenatal care and to follow your health care provider's instructions. Prenatal care is all the medical care you receive before the baby's birth. This care will help prevent, find, and treat any problems during the pregnancy and childbirth. Body changes during your first trimester Your body goes through many changes during pregnancy. The changes vary from woman to woman.  You may gain or lose a couple of pounds at first.  You may feel sick to your stomach (nauseous) and you may throw up (vomit). If the vomiting is uncontrollable, call your health care provider.  You may tire easily.  You may develop headaches that can be relieved by medicines. All medicines should be approved by your health care provider.  You may urinate more often. Painful urination may mean you have a bladder infection.  You may develop heartburn as a result of your pregnancy.  You may develop constipation because certain hormones are causing the muscles that push stool through your intestines to slow down.  You may develop hemorrhoids or swollen veins (varicose veins).  Your breasts may begin to grow larger and become tender. Your nipples may stick out more, and the tissue that surrounds them (areola) may become darker.  Your gums may bleed and may be  sensitive to brushing and flossing.  Dark spots or blotches (chloasma, mask of pregnancy) may develop on your face. This will likely fade after the baby is born.  Your menstrual periods will stop.  You may have a loss of appetite.  You may develop cravings for certain kinds of food.  You may have changes in your emotions from day to day, such as being excited to be pregnant or being concerned that something may go wrong with the pregnancy and baby.  You may have more vivid and strange dreams.  You may have changes in your hair. These can include thickening of your hair, rapid growth, and changes in texture. Some women also have hair loss during or after pregnancy, or hair that feels dry or thin. Your hair will most likely return to normal after your baby is born.  What to expect at prenatal visits During a routine prenatal visit:  You will be weighed to make sure you and the baby are growing normally.  Your blood pressure will be taken.  Your abdomen will be measured to track your baby's growth.  The fetal heartbeat will be listened to between weeks 10 and 14 of your pregnancy.  Test results from any previous visits will be discussed.  Your health care provider may ask you:  How you are feeling.  If you are feeling the baby move.  If you have had any abnormal symptoms, such as leaking fluid, bleeding, severe  headaches, or abdominal cramping.  If you are using any tobacco products, including cigarettes, chewing tobacco, and electronic cigarettes.  If you have any questions.  Other tests that may be performed during your first trimester include:  Blood tests to find your blood type and to check for the presence of any previous infections. The tests will also be used to check for low iron levels (anemia) and protein on red blood cells (Rh antibodies). Depending on your risk factors, or if you previously had diabetes during pregnancy, you may have tests to check for high blood  sugar that affects pregnant women (gestational diabetes).  Urine tests to check for infections, diabetes, or protein in the urine.  An ultrasound to confirm the proper growth and development of the baby.  Fetal screens for spinal cord problems (spina bifida) and Down syndrome.  HIV (human immunodeficiency virus) testing. Routine prenatal testing includes screening for HIV, unless you choose not to have this test.  You may need other tests to make sure you and the baby are doing well.  Follow these instructions at home: Medicines  Follow your health care provider's instructions regarding medicine use. Specific medicines may be either safe or unsafe to take during pregnancy.  Take a prenatal vitamin that contains at least 600 micrograms (mcg) of folic acid.  If you develop constipation, try taking a stool softener if your health care provider approves. Eating and drinking  Eat a balanced diet that includes fresh fruits and vegetables, whole grains, good sources of protein such as meat, eggs, or tofu, and low-fat dairy. Your health care provider will help you determine the amount of weight gain that is right for you.  Avoid raw meat and uncooked cheese. These carry germs that can cause birth defects in the baby.  Eating four or five small meals rather than three large meals a day may help relieve nausea and vomiting. If you start to feel nauseous, eating a few soda crackers can be helpful. Drinking liquids between meals, instead of during meals, also seems to help ease nausea and vomiting.  Limit foods that are high in fat and processed sugars, such as fried and sweet foods.  To prevent constipation: ? Eat foods that are high in fiber, such as fresh fruits and vegetables, whole grains, and beans. ? Drink enough fluid to keep your urine clear or pale yellow. Activity  Exercise only as directed by your health care provider. Most women can continue their usual exercise routine during  pregnancy. Try to exercise for 30 minutes at least 5 days a week. Exercising will help you: ? Control your weight. ? Stay in shape. ? Be prepared for labor and delivery.  Experiencing pain or cramping in the lower abdomen or lower back is a good sign that you should stop exercising. Check with your health care provider before continuing with normal exercises.  Try to avoid standing for long periods of time. Move your legs often if you must stand in one place for a long time.  Avoid heavy lifting.  Wear low-heeled shoes and practice good posture.  You may continue to have sex unless your health care provider tells you not to. Relieving pain and discomfort  Wear a good support bra to relieve breast tenderness.  Take warm sitz baths to soothe any pain or discomfort caused by hemorrhoids. Use hemorrhoid cream if your health care provider approves.  Rest with your legs elevated if you have leg cramps or low back pain.  If you  develop varicose veins in your legs, wear support hose. Elevate your feet for 15 minutes, 3-4 times a day. Limit salt in your diet. Prenatal care  Schedule your prenatal visits by the twelfth week of pregnancy. They are usually scheduled monthly at first, then more often in the last 2 months before delivery.  Write down your questions. Take them to your prenatal visits.  Keep all your prenatal visits as told by your health care provider. This is important. Safety  Wear your seat belt at all times when driving.  Make a list of emergency phone numbers, including numbers for family, friends, the hospital, and police and fire departments. General instructions  Ask your health care provider for a referral to a local prenatal education class. Begin classes no later than the beginning of month 6 of your pregnancy.  Ask for help if you have counseling or nutritional needs during pregnancy. Your health care provider can offer advice or refer you to specialists for help  with various needs.  Do not use hot tubs, steam rooms, or saunas.  Do not douche or use tampons or scented sanitary pads.  Do not cross your legs for long periods of time.  Avoid cat litter boxes and soil used by cats. These carry germs that can cause birth defects in the baby and possibly loss of the fetus by miscarriage or stillbirth.  Avoid all smoking, herbs, alcohol, and medicines not prescribed by your health care provider. Chemicals in these products affect the formation and growth of the baby.  Do not use any products that contain nicotine or tobacco, such as cigarettes and e-cigarettes. If you need help quitting, ask your health care provider. You may receive counseling support and other resources to help you quit.  Schedule a dentist appointment. At home, brush your teeth with a soft toothbrush and be gentle when you floss. Contact a health care provider if:  You have dizziness.  You have mild pelvic cramps, pelvic pressure, or nagging pain in the abdominal area.  You have persistent nausea, vomiting, or diarrhea.  You have a bad smelling vaginal discharge.  You have pain when you urinate.  You notice increased swelling in your face, hands, legs, or ankles.  You are exposed to fifth disease or chickenpox.  You are exposed to German measles (rubella) and have never had it. Get help right away if:  You have a fever.  You are leaking fluid from your vagina.  You have spotting or bleeding from your vagina.  You have severe abdominal cramping or pain.  You have rapid weight gain or loss.  You vomit blood or material that looks like coffee grounds.  You develop a severe headache.  You have shortness of breath.  You have any kind of trauma, such as from a fall or a car accident. Summary  The first trimester of pregnancy is from week 1 until the end of week 13 (months 1 through 3).  Your body goes through many changes during pregnancy. The changes vary from  woman to woman.  You will have routine prenatal visits. During those visits, your health care provider will examine you, discuss any test results you may have, and talk with you about how you are feeling. This information is not intended to replace advice given to you by your health care provider. Make sure you discuss any questions you have with your health care provider. Document Released: 11/05/2001 Document Revised: 10/23/2016 Document Reviewed: 10/23/2016 Elsevier Interactive Patient Education  2018   [place].   Breastfeeding Choosing to breastfeed is one of the best decisions you can make for yourself and your baby. A change in hormones during pregnancy causes your breasts to make breast milk in your milk-producing glands. Hormones prevent breast milk from being released before your baby is born. They also prompt milk flow after birth. Once breastfeeding has begun, thoughts of your baby, as well as his or her sucking or crying, can stimulate the release of milk from your milk-producing glands. Benefits of breastfeeding Research shows that breastfeeding offers many health benefits for infants and mothers. It also offers a cost-free and convenient way to feed your baby. For your baby  Your first milk (colostrum) helps your baby's digestive system to function better.  Special cells in your milk (antibodies) help your baby to fight off infections.  Breastfed babies are less likely to develop asthma, allergies, obesity, or type 2 diabetes. They are also at lower risk for sudden infant death syndrome (SIDS).  Nutrients in breast milk are better able to meet your baby's needs compared to infant formula.  Breast milk improves your baby's brain development. For you  Breastfeeding helps to create a very special bond between you and your baby.  Breastfeeding is convenient. Breast milk costs nothing and is always available at the correct temperature.  Breastfeeding helps to burn calories.  It helps you to lose the weight that you gained during pregnancy.  Breastfeeding makes your uterus return faster to its size before pregnancy. It also slows bleeding (lochia) after you give birth.  Breastfeeding helps to lower your risk of developing type 2 diabetes, osteoporosis, rheumatoid arthritis, cardiovascular disease, and breast, ovarian, uterine, and endometrial cancer later in life. Breastfeeding basics Starting breastfeeding  Find a comfortable place to sit or lie down, with your neck and back well-supported.  Place a pillow or a rolled-up blanket under your baby to bring him or her to the level of your breast (if you are seated). Nursing pillows are specially designed to help support your arms and your baby while you breastfeed.  Make sure that your baby's tummy (abdomen) is facing your abdomen.  Gently massage your breast. With your fingertips, massage from the outer edges of your breast inward toward the nipple. This encourages milk flow. If your milk flows slowly, you may need to continue this action during the feeding.  Support your breast with 4 fingers underneath and your thumb above your nipple (make the letter "C" with your hand). Make sure your fingers are well away from your nipple and your baby's mouth.  Stroke your baby's lips gently with your finger or nipple.  When your baby's mouth is open wide enough, quickly bring your baby to your breast, placing your entire nipple and as much of the areola as possible into your baby's mouth. The areola is the colored area around your nipple. ? More areola should be visible above your baby's upper lip than below the lower lip. ? Your baby's lips should be opened and extended outward (flanged) to ensure an adequate, comfortable latch. ? Your baby's tongue should be between his or her lower gum and your breast.  Make sure that your baby's mouth is correctly positioned around your nipple (latched). Your baby's lips should create a  seal on your breast and be turned out (everted).  It is common for your baby to suck about 2-3 minutes in order to start the flow of breast milk. Latching Teaching your baby how to latch onto  your breast properly is very important. An improper latch can cause nipple pain, decreased milk supply, and poor weight gain in your baby. Also, if your baby is not latched onto your nipple properly, he or she may swallow some air during feeding. This can make your baby fussy. Burping your baby when you switch breasts during the feeding can help to get rid of the air. However, teaching your baby to latch on properly is still the best way to prevent fussiness from swallowing air while breastfeeding. Signs that your baby has successfully latched onto your nipple  Silent tugging or silent sucking, without causing you pain. Infant's lips should be extended outward (flanged).  Swallowing heard between every 3-4 sucks once your milk has started to flow (after your let-down milk reflex occurs).  Muscle movement above and in front of his or her ears while sucking.  Signs that your baby has not successfully latched onto your nipple  Sucking sounds or smacking sounds from your baby while breastfeeding.  Nipple pain.  If you think your baby has not latched on correctly, slip your finger into the corner of your baby's mouth to break the suction and place it between your baby's gums. Attempt to start breastfeeding again. Signs of successful breastfeeding Signs from your baby  Your baby will gradually decrease the number of sucks or will completely stop sucking.  Your baby will fall asleep.  Your baby's body will relax.  Your baby will retain a small amount of milk in his or her mouth.  Your baby will let go of your breast by himself or herself.  Signs from you  Breasts that have increased in firmness, weight, and size 1-3 hours after feeding.  Breasts that are softer immediately after  breastfeeding.  Increased milk volume, as well as a change in milk consistency and color by the fifth day of breastfeeding.  Nipples that are not sore, cracked, or bleeding.  Signs that your baby is getting enough milk  Wetting at least 1-2 diapers during the first 24 hours after birth.  Wetting at least 5-6 diapers every 24 hours for the first week after birth. The urine should be clear or pale yellow by the age of 5 days.  Wetting 6-8 diapers every 24 hours as your baby continues to grow and develop.  At least 3 stools in a 24-hour period by the age of 5 days. The stool should be soft and yellow.  At least 3 stools in a 24-hour period by the age of 7 days. The stool should be seedy and yellow.  No loss of weight greater than 10% of birth weight during the first 3 days of life.  Average weight gain of 4-7 oz (113-198 g) per week after the age of 4 days.  Consistent daily weight gain by the age of 5 days, without weight loss after the age of 2 weeks. After a feeding, your baby may spit up a small amount of milk. This is normal. Breastfeeding frequency and duration Frequent feeding will help you make more milk and can prevent sore nipples and extremely full breasts (breast engorgement). Breastfeed when you feel the need to reduce the fullness of your breasts or when your baby shows signs of hunger. This is called "breastfeeding on demand." Signs that your baby is hungry include:  Increased alertness, activity, or restlessness.  Movement of the head from side to side.  Opening of the mouth when the corner of the mouth or cheek is stroked (rooting).  Increased sucking sounds, smacking lips, cooing, sighing, or squeaking.  Hand-to-mouth movements and sucking on fingers or hands.  Fussing or crying.  Avoid introducing a pacifier to your baby in the first 4-6 weeks after your baby is born. After this time, you may choose to use a pacifier. Research has shown that pacifier use during  the first year of a baby's life decreases the risk of sudden infant death syndrome (SIDS). Allow your baby to feed on each breast as long as he or she wants. When your baby unlatches or falls asleep while feeding from the first breast, offer the second breast. Because newborns are often sleepy in the first few weeks of life, you may need to awaken your baby to get him or her to feed. Breastfeeding times will vary from baby to baby. However, the following rules can serve as a guide to help you make sure that your baby is properly fed:  Newborns (babies 65 weeks of age or younger) may breastfeed every 1-3 hours.  Newborns should not go without breastfeeding for longer than 3 hours during the day or 5 hours during the night.  You should breastfeed your baby a minimum of 8 times in a 24-hour period.  Breast milk pumping Pumping and storing breast milk allows you to make sure that your baby is exclusively fed your breast milk, even at times when you are unable to breastfeed. This is especially important if you go back to work while you are still breastfeeding, or if you are not able to be present during feedings. Your lactation consultant can help you find a method of pumping that works best for you and give you guidelines about how long it is safe to store breast milk. Caring for your breasts while you breastfeed Nipples can become dry, cracked, and sore while breastfeeding. The following recommendations can help keep your breasts moisturized and healthy:  Avoid using soap on your nipples.  Wear a supportive bra designed especially for nursing. Avoid wearing underwire-style bras or extremely tight bras (sports bras).  Air-dry your nipples for 3-4 minutes after each feeding.  Use only cotton bra pads to absorb leaked breast milk. Leaking of breast milk between feedings is normal.  Use lanolin on your nipples after breastfeeding. Lanolin helps to maintain your skin's normal moisture barrier. Pure  lanolin is not harmful (not toxic) to your baby. You may also hand express a few drops of breast milk and gently massage that milk into your nipples and allow the milk to air-dry.  In the first few weeks after giving birth, some women experience breast engorgement. Engorgement can make your breasts feel heavy, warm, and tender to the touch. Engorgement peaks within 3-5 days after you give birth. The following recommendations can help to ease engorgement:  Completely empty your breasts while breastfeeding or pumping. You may want to start by applying warm, moist heat (in the shower or with warm, water-soaked hand towels) just before feeding or pumping. This increases circulation and helps the milk flow. If your baby does not completely empty your breasts while breastfeeding, pump any extra milk after he or she is finished.  Apply ice packs to your breasts immediately after breastfeeding or pumping, unless this is too uncomfortable for you. To do this: ? Put ice in a plastic bag. ? Place a towel between your skin and the bag. ? Leave the ice on for 20 minutes, 2-3 times a day.  Make sure that your baby is latched on and  positioned properly while breastfeeding.  If engorgement persists after 48 hours of following these recommendations, contact your health care provider or a Science writer. Overall health care recommendations while breastfeeding  Eat 3 healthy meals and 3 snacks every day. Well-nourished mothers who are breastfeeding need an additional 450-500 calories a day. You can meet this requirement by increasing the amount of a balanced diet that you eat.  Drink enough water to keep your urine pale yellow or clear.  Rest often, relax, and continue to take your prenatal vitamins to prevent fatigue, stress, and low vitamin and mineral levels in your body (nutrient deficiencies).  Do not use any products that contain nicotine or tobacco, such as cigarettes and e-cigarettes. Your baby may  be harmed by chemicals from cigarettes that pass into breast milk and exposure to secondhand smoke. If you need help quitting, ask your health care provider.  Avoid alcohol.  Do not use illegal drugs or marijuana.  Talk with your health care provider before taking any medicines. These include over-the-counter and prescription medicines as well as vitamins and herbal supplements. Some medicines that may be harmful to your baby can pass through breast milk.  It is possible to become pregnant while breastfeeding. If birth control is desired, ask your health care provider about options that will be safe while breastfeeding your baby. Where to find more information: [employer]: www.llli.org Contact a health care provider if:  You feel like you want to stop breastfeeding or have become frustrated with breastfeeding.  Your nipples are cracked or bleeding.  Your breasts are red, tender, or warm.  You have: ? Painful breasts or nipples. ? A swollen area on either breast. ? A fever or chills. ? Nausea or vomiting. ? Drainage other than breast milk from your nipples.  Your breasts do not become full before feedings by the fifth day after you give birth.  You feel sad and depressed.  Your baby is: ? Too sleepy to eat well. ? Having trouble sleeping. ? More than 58 week old and wetting fewer than 6 diapers in a 24-hour period. ? Not gaining weight by 52 days of age.  Your baby has fewer than 3 stools in a 24-hour period.  Your baby's skin or the white parts of his or her eyes become yellow. Get help right away if:  Your baby is overly tired (lethargic) and does not want to wake up and feed.  Your baby develops an unexplained fever. Summary  Breastfeeding offers many health benefits for infant and mothers.  Try to breastfeed your infant when he or she shows early signs of hunger.  Gently tickle or stroke your baby's lips with your finger or nipple to allow the  baby to open his or her mouth. Bring the baby to your breast. Make sure that much of the areola is in your baby's mouth. Offer one side and burp the baby before you offer the other side.  Talk with your health care provider or lactation consultant if you have questions or you face problems as you breastfeed. This information is not intended to replace advice given to you by your health care provider. Make sure you discuss any questions you have with your health care provider. Document Released: 11/11/2005 Document Revised: 12/13/2016 Document Reviewed: 12/13/2016 Elsevier Interactive Patient Education  [employer].

## 2018-04-23 LAB — CYTOLOGY - PAP
Chlamydia: NEGATIVE
Diagnosis: NEGATIVE
Neisseria Gonorrhea: NEGATIVE

## 2018-04-24 ENCOUNTER — Encounter (HOSPITAL_COMMUNITY): Payer: Self-pay | Admitting: *Deleted

## 2018-04-24 ENCOUNTER — Other Ambulatory Visit: Payer: Self-pay

## 2018-04-25 LAB — CULTURE, OB URINE

## 2018-04-25 LAB — URINE CULTURE, OB REFLEX

## 2018-04-27 ENCOUNTER — Encounter (HOSPITAL_COMMUNITY): Payer: Self-pay

## 2018-04-27 ENCOUNTER — Inpatient Hospital Stay (HOSPITAL_COMMUNITY)
Admission: AD | Admit: 2018-04-27 | Discharge: 2018-04-27 | Disposition: A | Discharge status: Home / Self Care | Payer: Medicaid Other | Source: Ambulatory Visit | Attending: Obstetrics & Gynecology | Admitting: Obstetrics & Gynecology

## 2018-04-27 ENCOUNTER — Telehealth: Payer: Self-pay

## 2018-04-27 DIAGNOSIS — O26891 Other specified pregnancy related conditions, first trimester: Secondary | ICD-10-CM | POA: Insufficient documentation

## 2018-04-27 DIAGNOSIS — R102 Pelvic and perineal pain unspecified side: Secondary | ICD-10-CM

## 2018-04-27 DIAGNOSIS — Z3A09 9 weeks gestation of pregnancy: Secondary | ICD-10-CM | POA: Insufficient documentation

## 2018-04-27 DIAGNOSIS — Z882 Allergy status to sulfonamides status: Secondary | ICD-10-CM | POA: Insufficient documentation

## 2018-04-27 DIAGNOSIS — R109 Unspecified abdominal pain: Secondary | ICD-10-CM | POA: Insufficient documentation

## 2018-04-27 DIAGNOSIS — O3431 Maternal care for cervical incompetence, first trimester: Secondary | ICD-10-CM | POA: Diagnosis not present

## 2018-04-27 DIAGNOSIS — Z87891 Personal history of nicotine dependence: Secondary | ICD-10-CM | POA: Insufficient documentation

## 2018-04-27 DIAGNOSIS — O09291 Supervision of pregnancy with other poor reproductive or obstetric history, first trimester: Secondary | ICD-10-CM

## 2018-04-27 DIAGNOSIS — Z3491 Encounter for supervision of normal pregnancy, unspecified, first trimester: Secondary | ICD-10-CM

## 2018-04-27 DIAGNOSIS — Z79899 Other long term (current) drug therapy: Secondary | ICD-10-CM | POA: Diagnosis not present

## 2018-04-27 LAB — URINALYSIS, ROUTINE W REFLEX MICROSCOPIC
BILIRUBIN URINE: NEGATIVE
Glucose, UA: NEGATIVE mg/dL
KETONES UR: NEGATIVE mg/dL
Leukocytes, UA: NEGATIVE
Nitrite: NEGATIVE
Protein, ur: NEGATIVE mg/dL
SPECIFIC GRAVITY, URINE: 1.012 (ref 1.005–1.030)
pH: 7 (ref 5.0–8.0)

## 2018-04-27 LAB — OBSTETRIC PANEL, INCLUDING HIV
ANTIBODY SCREEN: NEGATIVE
BASOS: 0 %
Basophils Absolute: 0 10*3/uL (ref 0.0–0.2)
EOS (ABSOLUTE): 0.1 10*3/uL (ref 0.0–0.4)
Eos: 1 %
HEMATOCRIT: 40.8 % (ref 34.0–46.6)
HIV SCREEN 4TH GENERATION: NONREACTIVE
Hemoglobin: 14.3 g/dL (ref 11.1–15.9)
Hepatitis B Surface Ag: NEGATIVE
Immature Grans (Abs): 0 10*3/uL (ref 0.0–0.1)
Immature Granulocytes: 1 %
Lymphocytes Absolute: 1.9 10*3/uL (ref 0.7–3.1)
Lymphs: 31 %
MCH: 34.6 pg — ABNORMAL HIGH (ref 26.6–33.0)
MCHC: 35 g/dL (ref 31.5–35.7)
MCV: 99 fL — ABNORMAL HIGH (ref 79–97)
Monocytes Absolute: 0.4 10*3/uL (ref 0.1–0.9)
Monocytes: 7 %
NEUTROS ABS: 3.7 10*3/uL (ref 1.4–7.0)
Neutrophils: 60 %
Platelets: 251 10*3/uL (ref 150–450)
RBC: 4.13 x10E6/uL (ref 3.77–5.28)
RDW: 13.4 % (ref 12.3–15.4)
RPR Ser Ql: NONREACTIVE
Rh Factor: POSITIVE
Rubella Antibodies, IGG: 12.2 index (ref >0.99)
WBC: 6.1 10*3/uL (ref 3.4–10.8)

## 2018-04-27 LAB — SMN1 COPY NUMBER ANALYSIS (SMA CARRIER SCREENING)

## 2018-04-27 LAB — HEMOGLOBINOPATHY EVALUATION
Ferritin: 61 ng/mL (ref 15–150)
HGB A2 QUANT: 2.1 % (ref 1.8–3.2)
HGB A: 97.9 % (ref 96.4–98.8)
HGB S: 0 %
Hgb C: 0 %
Hgb F Quant: 0 % (ref 0.0–2.0)
Hgb Solubility: NEGATIVE
Hgb Variant: 0 %

## 2018-04-27 LAB — CYSTIC FIBROSIS GENE TEST

## 2018-04-27 LAB — HEMOGLOBIN A1C
ESTIMATED AVERAGE GLUCOSE: 103 mg/dL
Hgb A1c MFr Bld: 5.2 % (ref 4.8–5.6)

## 2018-04-27 NOTE — Discharge Instructions (Signed)
Cervical Insufficiency Cervical insufficiency is when the cervix is weak and starts to open (dilate) and thin (efface) before the pregnancy is at term and before labor starts. This is also called incompetent cervix. It can happen during the second or third trimester when the fetus starts putting pressure on the cervix. Treatment may reduce the risk of problems for you and your baby. Cervical insufficiency can lead to:  Loss of the baby (miscarriage).  Breaking of the sac that holds the baby (amniotic sac). This is also called preterm premature rupture of the membranes,PPROM.  The baby being born early (preterm birth).  What are the causes? The cause of this condition is not well known. However, it may be caused by abnormalities in the cervix and other factors such as inflammation or infection. What increases the risk? This condition is more likely to develop if:  You have a shorter cervix than normal.  Your cervix was damaged or injured during a past pregnancy or surgery.  You were born with a cervical defect.  You have had a procedure done on the cervix, such as cervical biopsy.  You have a history of: ? Cervical insufficiency. ? PPROM.  You have ended several pregnancies through abortion.  You were exposed to the drug diethylstilbestrol (DES).  What are the signs or symptoms? Symptoms of this condition can vary. Sometimes there no symptoms for this condition, and at other times there are mild symptoms that start between weeks 14 and 20 of pregnancy. The symptoms may last several days or weeks. These symptoms include:  Light spotting or bleeding from the vagina.  Pelvic pressure.  A change in vaginal discharge, such as changes from clear, white, or light yellow to pink or tan.  Back pain.  Abdominal pain or cramping.  How is this diagnosed? This condition may be diagnosed based on:  Your symptoms.  Your medical history, including: ? Any problems during past  pregnancies, such as miscarriages. ? Any procedures performed on your cervix. ? Any history of cervical insufficiency.  During the second trimester, cervical insufficiency may be diagnosed based on:  An ultrasound done with a probe inserted into your vagina (transvaginal ultrasound).  A pelvic exam.  Tests of fluid in the amniotic sac. This is done to rule out infection.  How is this treated? This condition may be managed by:  Limiting physical activity.  Limiting activity at home or in the hospital.  Pelvic rest. This means that there should be no sexual intercourse or placing anything in the vagina.  A procedure to sew the cervix closed and prevent it from opening too early (cerclage). The stitches (sutures) are removed between weeks 36 and 38 to avoid problems during labor. Cerclage may be recommended if: ? You have a history of miscarriages or preterm births without a known cause. ? You have a short cervix. A short cervix is identified by ultrasound. ? Your cervix has dilated before 24 weeks of pregnancy.  Follow these instructions at home:  Get plenty of rest and lessen activity as told by your health care provider. Ask your health care provider what activities are safe for you.  If pelvic rest was recommended, you shouldnot have sex, use tampons, douche, or place anything inside your vagina until your health care provider says that this is okay.  Take over-the-counter and prescription medicines only as told by your health care provider.  Keep all follow-up visits and prenatal visits as told by your health care provider. This is important.  Get help right away if:  You have vaginal bleeding, even if it is a small amount or even if it is painless.  You have pain in your abdomen or your lower back.  You have a feeling of increased pressure in your pelvis.  You have vaginal discharge that changes from clear, white, or light yellow to pink or tan.  You have a  fever.  You have severe nausea or vomiting. Summary  Cervical insufficiency is when the cervix is weak and starts to dilate and efface before the pregnancy is at term and before labor starts.  Symptoms of this condition can vary from no symptoms to mild symptoms that start between weeks 14 and 20 of pregnancy. The symptoms may last several days or weeks.  This condition may be managed by limiting physical activity, having pelvic rest, or having cervical cerclage.  If pelvic rest was recommended, you should not have sex, use tampons, use a douche, or place anything inside your vagina until your health care provider says that this is okay. This information is not intended to replace advice given to you by your health care provider. Make sure you discuss any questions you have with your health care provider. Document Released: 11/11/2005 Document Revised: 11/14/2016 Document Reviewed: 11/14/2016 Elsevier Interactive Patient Education  2017 [employer].

## 2018-04-27 NOTE — MAU Provider Note (Addendum)
History     CSN: [id number]  Arrival date and time: 04/27/18 1658   First Provider Initiated Contact with Patient 04/27/18 1755      Chief Complaint  Patient presents with  . Abdominal Pain  . incompetent cervix   HPI   Kim Johns is 27 y.o. G3P1101 [redacted]w[redacted]d weeks presenting with vaginal pressure X 3 days, "something is in there"  " When I squat to look, I can see my cervix.".  Used vaginal yeast creame last week X 3 applicators, inserting applicator half the distance.  Sent here by clinic.  She has hx of loss at 21 week from incompetent cervix, delivered after a rescue cerclage at 20 weeks and carried pregnancy to 41 weeks.  She is very anxious for these reasons. Denies vaginal bleeding or loss of female.  She has not had intercourse in 5 weeks.  Has cerclage scheduled for June 24.     Past Medical History:  Diagnosis Date  . Abscess of breast   . Allergy   . Anxiety   . Asthma    as a child - no inhaler  . Bipolar 1 disorder ([place])   . Depression   . GERD (gastroesophageal reflux disease)    diet control  . HSV-2 (herpes simplex virus 2) infection   . Pelvic inflammatory disease   . Preterm labor   . SVD (spontaneous vaginal delivery)    x 2 - fetal demise at 21 weeks, 1 living    Past Surgical History:  Procedure Laterality Date  . CERVICAL CERCLAGE N/A 04/05/2015   Procedure: CERCLAGE CERVICAL;  Surgeon: Lavonia Drafts, MD;  Location: [facility];  Service: Gynecology;  Laterality: N/A;  . WISDOM TOOTH EXTRACTION      Family History  Problem Relation Age of Onset  . Diabetes Mother   . Stroke Mother   . Hypertension Father   . Heart attack Father   . Heart disease Father     Social History   Tobacco Use  . Smoking status: Former Smoker    Packs/day: 0.50    Years: 10.00    Pack years: 5.00    Types: Cigarettes    Last attempt to quit: 03/09/2018    Years since quitting: 0.1  . Smokeless tobacco: Never Used  Substance Use Topics  . Alcohol use: Yes     Alcohol/week: 0.0 oz    Comment: occ but none with pregnancy  . Drug use: No    Allergies:  Allergies  Allergen Reactions  . Shellfish Allergy Hives  . Sulfa Antibiotics Other (See Comments)    Reaction:  Unknown; childhood reaction  . Icy Hot Rash    Medications Prior to Admission  Medication Sig Dispense Refill Last Dose  . acetaminophen (TYLENOL) 500 MG tablet Take 1 tablet (500 mg total) by mouth every 6 (six) hours as needed. (Patient taking differently: Take 500 mg by mouth every 6 (six) hours as needed for moderate pain. ) 30 tablet 0 Taking  . hydroxyprogesterone caproate (MAKENA) 250 mg/mL OIL injection Inject 1 mL (250 mg total) into the muscle once for 1 dose. 1 mL 0   . Prenatal Vit-Fe Fumarate-FA (PRENATAL MULTIVITAMIN) TABS tablet Take 1 tablet by mouth daily at 12 noon.   Taking  . terconazole (TERAZOL 3) 0.8 % vaginal cream Place 1 applicator vaginally at bedtime. 20 g 0   . valACYclovir (VALTREX) 1000 MG tablet Take 1 tablet (1,000 mg total) by mouth daily. (Patient not taking: Reported on 04/07/2018)  5 tablet 3 Not Taking    Review of Systems  Constitutional: Negative for activity change and appetite change.  Gastrointestinal: Negative for abdominal pain.  Genitourinary: Positive for vaginal pain (vaginal pressure, "something" is seen on her exam.). Negative for dysuria, flank pain, hematuria and vaginal bleeding.       Reports that she can see her cervix.   Physical Exam   Blood pressure 127/65, pulse 62, temperature 98.9 F (37.2 C), temperature source Oral, resp. rate 16, height 5\' 3"  (1.6 m), weight 175 lb 1.9 oz (79.4 kg), last menstrual period 02/22/2018.  Physical Exam  Nursing note and vitals reviewed. Constitutional: She is oriented to person, place, and time. She appears well-developed and well-nourished.  HENT:  Head: Normocephalic.  Neck: Normal range of motion.  Cardiovascular: Normal rate.  Respiratory: Effort normal.  GI: There is no  tenderness.  Genitourinary: There is no rash, tenderness, lesion or injury on the right labia. There is no rash, tenderness, lesion or injury on the left labia. Cervix exhibits no discharge. No tenderness or bleeding in the vagina. No vaginal discharge found.  Genitourinary Comments: From approx noon to 3 oclock, the cervical lip is distorted from previous cerclage.  It is not bleeding.    Neurological: She is alert and oriented to person, place, and time.  Skin: Skin is warm and dry.  Psychiatric: She has a normal mood and affect. Her behavior is normal.   Results for orders placed or performed during the hospital encounter of 04/27/18 (from the past 24 hour(s))  Urinalysis, Routine w reflex microscopic     Status: Abnormal   Collection Time: 04/27/18  5:39 PM  Result Value Ref Range   Color, Urine YELLOW YELLOW   APPearance CLEAR CLEAR   Specific Gravity, Urine 1.012 1.005 - 1.030   pH 7.0 5.0 - 8.0   Glucose, UA NEGATIVE NEGATIVE mg/dL   Hgb urine dipstick SMALL (A) NEGATIVE   Bilirubin Urine NEGATIVE NEGATIVE   Ketones, ur NEGATIVE NEGATIVE mg/dL   Protein, ur NEGATIVE NEGATIVE mg/dL   Nitrite NEGATIVE NEGATIVE   Leukocytes, UA NEGATIVE NEGATIVE   RBC / HPF 0-5 0 - 5 RBC/hpf   Bacteria, UA RARE (A) NONE SEEN   Squamous Epithelial / LPF 0-5 0 - 5   MAU Course  Procedures  MDM MSE Exam Consulted with Dr. Rip Harbour.  She is quiet anxious and I asked Dr. Rip Harbour to exam patient.  He reassured her that the findings were not worrisome.  He spent a lot of time reassuring her and explaining plan of care for cerclage.    Assessment and Plan  A;  Vaginal pressure       Hx of cervical incompetence with 1 pregnancy loss at [redacted] weeks gestation    P: Patient instructed to continue pelvic rest      Keep scheduled appointment for cerclage with Dr. Zachery Conch M Key 04/27/2018, 6:23 PM   Pt seen and examined. Exam revealed an extension of the anterior cervical lip from about 1 o'clock  to 3 o'clock position about 1 cm x 1 cm. Pt was reassured. Instructed to keep appt for cerclage placement. She verbalized understanding  And appeared comfortable with POC.  Arlina Robes, MD

## 2018-04-27 NOTE — Telephone Encounter (Signed)
Pt left message on Nurse VM stating that when she squats down that her cervix is outside of her vagina, called pt, no answer. Left VM advised her to go to MAU asap.

## 2018-04-27 NOTE — MAU Note (Signed)
Pt C/O lower abd cramping, has felt like "something is in the vagina" for the past 2 days.  Looked with mirror, states she can see cervix @ introitus.  Hx of incompetent cervix.  Denies bleeding.

## 2018-04-29 ENCOUNTER — Telehealth: Payer: Self-pay | Admitting: General Practice

## 2018-04-29 DIAGNOSIS — Z141 Cystic fibrosis carrier: Secondary | ICD-10-CM

## 2018-04-29 NOTE — Telephone Encounter (Signed)
Scheduled appt in MFM 6/11 @ 9am. Called & informed patient of results, need for the FOB to be tested & appt in MFM as well. Patient verbalized understanding to all & had no questions.

## 2018-04-29 NOTE — Telephone Encounter (Signed)
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

## 2018-05-05 ENCOUNTER — Telehealth: Payer: Self-pay

## 2018-05-05 ENCOUNTER — Ambulatory Visit (HOSPITAL_COMMUNITY): Payer: Medicaid Other | Attending: Obstetrics and Gynecology

## 2018-05-05 NOTE — Telephone Encounter (Signed)
LM for pt to please come to the office so that she can fill out some paperwork or call the office.  MyChart message also sent.

## 2018-05-08 ENCOUNTER — Inpatient Hospital Stay (HOSPITAL_COMMUNITY)
Admission: AD | Admit: 2018-05-08 | Discharge: 2018-05-08 | Disposition: A | Discharge status: Home / Self Care | Payer: Medicaid Other | Source: Ambulatory Visit | Attending: Obstetrics & Gynecology | Admitting: Obstetrics & Gynecology

## 2018-05-08 ENCOUNTER — Encounter (HOSPITAL_COMMUNITY): Payer: Self-pay | Admitting: *Deleted

## 2018-05-08 ENCOUNTER — Inpatient Hospital Stay (HOSPITAL_COMMUNITY): Payer: Medicaid Other

## 2018-05-08 DIAGNOSIS — O3431 Maternal care for cervical incompetence, first trimester: Secondary | ICD-10-CM | POA: Diagnosis not present

## 2018-05-08 DIAGNOSIS — O9989 Other specified diseases and conditions complicating pregnancy, childbirth and the puerperium: Secondary | ICD-10-CM | POA: Diagnosis not present

## 2018-05-08 DIAGNOSIS — O26891 Other specified pregnancy related conditions, first trimester: Secondary | ICD-10-CM | POA: Insufficient documentation

## 2018-05-08 DIAGNOSIS — Z3A10 10 weeks gestation of pregnancy: Secondary | ICD-10-CM | POA: Insufficient documentation

## 2018-05-08 DIAGNOSIS — M545 Low back pain: Secondary | ICD-10-CM | POA: Diagnosis present

## 2018-05-08 DIAGNOSIS — O26899 Other specified pregnancy related conditions, unspecified trimester: Secondary | ICD-10-CM

## 2018-05-08 DIAGNOSIS — Z882 Allergy status to sulfonamides status: Secondary | ICD-10-CM | POA: Insufficient documentation

## 2018-05-08 DIAGNOSIS — Z87891 Personal history of nicotine dependence: Secondary | ICD-10-CM | POA: Insufficient documentation

## 2018-05-08 DIAGNOSIS — R3129 Other microscopic hematuria: Secondary | ICD-10-CM

## 2018-05-08 DIAGNOSIS — R109 Unspecified abdominal pain: Secondary | ICD-10-CM | POA: Diagnosis not present

## 2018-05-08 DIAGNOSIS — M549 Dorsalgia, unspecified: Secondary | ICD-10-CM

## 2018-05-08 DIAGNOSIS — R1030 Lower abdominal pain, unspecified: Secondary | ICD-10-CM | POA: Insufficient documentation

## 2018-05-08 DIAGNOSIS — R102 Pelvic and perineal pain: Secondary | ICD-10-CM

## 2018-05-08 LAB — URINALYSIS, ROUTINE W REFLEX MICROSCOPIC
BILIRUBIN URINE: NEGATIVE
Glucose, UA: NEGATIVE mg/dL
KETONES UR: NEGATIVE mg/dL
LEUKOCYTES UA: NEGATIVE
NITRITE: NEGATIVE
Protein, ur: NEGATIVE mg/dL
SPECIFIC GRAVITY, URINE: 1.013 (ref 1.005–1.030)
pH: 6 (ref 5.0–8.0)

## 2018-05-08 MED ORDER — PROGESTERONE MICRONIZED 200 MG PO CAPS: ORAL_CAPSULE | ORAL | 3 refills | Status: DC

## 2018-05-08 MED ORDER — CYCLOBENZAPRINE HCL 5 MG PO TABS: 5.0000 mg | ORAL_TABLET | Freq: Three times a day (TID) | ORAL | 0 refills | Status: DC | PRN

## 2018-05-08 MED ORDER — TRAMADOL HCL 50 MG PO TABS: 50.0000 mg | ORAL_TABLET | Freq: Four times a day (QID) | ORAL | 0 refills | Status: DC | PRN

## 2018-05-08 MED ORDER — CYCLOBENZAPRINE HCL 5 MG PO TABS: 5.0000 mg | ORAL_TABLET | Freq: Once | ORAL | Status: DC

## 2018-05-08 NOTE — MAU Provider Note (Signed)
Chief Complaint: Abdominal Pain   First Provider Initiated Contact with Patient 05/08/18 0515        SUBJECTIVE HPI: Kim Johns is a 27 y.o. G3P1101 at [redacted]w[redacted]d by LMP who presents to maternity admissions reporting lower abdominal pain and low back pain.  Back pain comes and goes, pelvic pain more persistent.. She denies vaginal bleeding, vaginal itching/burning, urinary symptoms, h/a, dizziness, n/v, or fever/chills.    History is remarkable for a 20 week loss due to incompetent cervix. Next pregnancy carried to term after cerclage.  Cerclage is scheduled for 05/18/18  Abdominal Pain  This is a new problem. The current episode started today. The onset quality is gradual. The problem occurs intermittently. The problem has been unchanged. The pain is located in the LLQ, RLQ and suprapubic region. The quality of the pain is cramping and aching. The abdominal pain radiates to the back. Pertinent negatives include no constipation, diarrhea, dysuria, fever, frequency or headaches. Nothing aggravates the pain. The pain is relieved by nothing. She has tried nothing for the symptoms.    RN Note: PT SAYS [facility]  IN CLINIC.   SAYS  STARTED BACK AND ABD CRAMPS  Thursday AM- NO MEDS.    PAIN IS SAME AS THEN -  NO N/V /D.    NO FEVER.    HAS HAD U/S- IUP WITH [facility]    Past Medical History:  Diagnosis Date  . Abscess of breast   . Allergy   . Anxiety   . Asthma    as a child - no inhaler  . Bipolar 1 disorder ([place])   . Depression   . GERD (gastroesophageal reflux disease)    diet control  . HSV-2 (herpes simplex virus 2) infection   . Pelvic inflammatory disease   . Preterm labor   . SVD (spontaneous vaginal delivery)    x 2 - fetal demise at 21 weeks, 1 living   Past Surgical History:  Procedure Laterality Date  . CERVICAL CERCLAGE N/A 04/05/2015   Procedure: CERCLAGE CERVICAL;  Surgeon: Lavonia Drafts, MD;  Location: [facility];  Service: Gynecology;  Laterality: N/A;  . WISDOM TOOTH  EXTRACTION     Social History   Socioeconomic History  . Marital status: Single    Spouse name: Not on file  . Number of children: Not on file  . Years of education: Not on file  . Highest education level: Not on file  Occupational History  . Not on file  Social Needs  . Financial resource strain: Not on file  . Food insecurity:    Worry: Not on file    Inability: Not on file  . Transportation needs:    Medical: Not on file    Non-medical: Not on file  Tobacco Use  . Smoking status: Former Smoker    Packs/day: 0.50    Years: 10.00    Pack years: 5.00    Types: Cigarettes    Last attempt to quit: 03/09/2018    Years since quitting: 0.1  . Smokeless tobacco: Never Used  Substance and Sexual Activity  . Alcohol use: Yes    Alcohol/week: 0.0 oz    Comment: occ but none with pregnancy  . Drug use: No  . Sexual activity: Yes    Birth control/protection: None    Comment: approx [redacted] wks gestation as of 04/24/18  Lifestyle  . Physical activity:    Days per week: Not on file    Minutes per session: Not on file  .  Stress: Not on file  Relationships  . Social connections:    Talks on phone: Not on file    Gets together: Not on file    Attends religious service: Not on file    Active member of club or organization: Not on file    Attends meetings of clubs or organizations: Not on file    Relationship status: Not on file  . Intimate partner violence:    Fear of current or ex partner: Not on file    Emotionally abused: Not on file    Physically abused: Not on file    Forced sexual activity: Not on file  Other Topics Concern  . Not on file  Social History Narrative  . Not on file   No current facility-administered medications on file prior to encounter.    Current Outpatient Medications on File Prior to Encounter  Medication Sig Dispense Refill  . Prenatal Vit-Fe Fumarate-FA (PRENATAL MULTIVITAMIN) TABS tablet Take 1 tablet by mouth daily at 12 noon.    Marland Kitchen acetaminophen  (TYLENOL) 500 MG tablet Take 1 tablet (500 mg total) by mouth every 6 (six) hours as needed. (Patient taking differently: Take 500 mg by mouth every 6 (six) hours as needed for moderate pain. ) 30 tablet 0  . hydroxyprogesterone caproate (MAKENA) 250 mg/mL OIL injection Inject 1 mL (250 mg total) into the muscle once for 1 dose. 1 mL 0  . valACYclovir (VALTREX) 1000 MG tablet Take 1 tablet (1,000 mg total) by mouth daily. (Patient not taking: Reported on 04/07/2018) 5 tablet 3   Allergies  Allergen Reactions  . Shellfish Allergy Hives  . Sulfa Antibiotics Other (See Comments)    Reaction:  Unknown; childhood reaction  . Icy Hot Rash    I have reviewed patient's Past Medical Hx, Surgical Hx, Family Hx, Social Hx, medications and allergies.   ROS:  Review of Systems  Constitutional: Negative for fever.  Gastrointestinal: Positive for abdominal pain. Negative for constipation and diarrhea.  Genitourinary: Negative for dysuria and frequency.  Neurological: Negative for headaches.   Review of Systems  Other systems negative   Physical Exam  Physical Exam Patient Vitals for the past 24 hrs:  BP Temp Temp src Pulse Resp Height Weight  05/08/18 0436 (!) 108/59 99.1 F (37.3 C) Oral 82 18 5\' 3"  (1.6 m) 177 lb 8 oz (80.5 kg)   Constitutional: Well-developed, well-nourished female in no acute distress.  Cardiovascular: normal rate Respiratory: normal effort GI: Abd soft, mildly to moderately tender over lower abdomen.Marland Kitchen Pos BS x 4 MS: Extremities nontender, no edema, normal ROM Neurologic: Alert and oriented x 4.  GU: Neg CVAT.  PELVIC EXAM: I elected to do a digital cervix exam.  Firm rounded protrusion, ? Cervix effaced?     FHT to be assessed in [place].  LAB RESULTS Results for orders placed or performed during the hospital encounter of 05/08/18 (from the past 24 hour(s))  Urinalysis, Routine w reflex microscopic     Status: Abnormal   Collection Time: 05/08/18  4:46 AM  Result  Value Ref Range   Color, Urine YELLOW YELLOW   APPearance CLEAR CLEAR   Specific Gravity, Urine 1.013 1.005 - 1.030   pH 6.0 5.0 - 8.0   Glucose, UA NEGATIVE NEGATIVE mg/dL   Hgb urine dipstick MODERATE (A) NEGATIVE   Bilirubin Urine NEGATIVE NEGATIVE   Ketones, ur NEGATIVE NEGATIVE mg/dL   Protein, ur NEGATIVE NEGATIVE mg/dL   Nitrite NEGATIVE NEGATIVE   Leukocytes, UA  NEGATIVE NEGATIVE   RBC / HPF 0-5 0 - 5 RBC/hpf   WBC, UA 0-5 0 - 5 WBC/hpf   Bacteria, UA RARE (A) NONE SEEN   Squamous Epithelial / LPF 0-5 0 - 5    O/Positive/-- (05/28 1144)  IMAGING US Ob Transvaginal  Result Date: 05/08/2018 CLINICAL DATA:  27 year old female with back and abdominal pain since yesterday in the 1st trimester of pregnancy. Estimated gestational age by ultrasound 10 weeks 5 days EXAM: TRANSVAGINAL OB ULTRASOUND TECHNIQUE: Transvaginal ultrasound was performed for complete evaluation of the gestation as well as the maternal uterus, adnexal regions, and pelvic cul-de-sac. COMPARISON:  03/28/2018. FINDINGS: Intrauterine gestational sac: Single Yolk sac:  Not visible Embryo:  Visible Cardiac Activity: Detected Heart Rate: 167 bpm CRL:   40.6 mm   10 w 6 d                  [place] EDC: 11/28/2018 Subchorionic hemorrhage:  None visualized. Maternal uterus/adnexae: The right ovary appears normal measuring 3.3 x 1.8 x 1.4 centimeters. The left ovary appears normal measuring 3.3 x 2.2 x 1.7 centimeters. The cervix appears long and closed on image 16. No pelvic free fluid. IMPRESSION: Single living IUP demonstrated with consistent estimated gestational age to the ultrasound 04/07/2018. No acute maternal findings visualized. Electronically Signed   By: Genevie Ann M.D.   On: 05/08/2018 06:37     MAU Management/MDM: Ordered UA and Ultrasound [place] reports live fetus with long cervix Tech states uterus is retroverted, so possibly fundus was what I was feeling  Consult Dr Harolyn Rutherford with presentation, exam findings, and  results.  Would recommend prometrium, tramadol and flexeril.  Advised not to take flexeril and tramadol together  Urine noted to have hematuria with no other signs of infection.  No localized pain to suggest kidney stone.  WIll send urine to culture and have pt report any change in pain  This pain can represent a normal pregnancy with cramping threatened abortion or uterine cramping which can threaten fetus.  The process as listed above helps to determine which of these is present.    ASSESSMENT Single IUP at [redacted]w[redacted]d History of incompetent cervix Pelvic cramping Low back pain Microscopic hematuria  PLAN Discharge home Rx Prometrium to help strengthen  Cervix prior to cerclage Rx Flexeril for back pain Rx Tramadol for abd pain Followup in clinic  Pt stable at time of discharge. Encouraged to return here or to other Urgent Care/ED if she develops worsening of symptoms, increase in pain, fever, or other concerning symptoms.    Hansel Feinstein CNM, MSN Certified Nurse-Midwife 05/08/2018  5:15 AM

## 2018-05-08 NOTE — MAU Note (Addendum)
PT SAYS [facility]  IN CLINIC.   SAYS  STARTED BACK AND ABD CRAMPS  Thursday AM- NO MEDS.    PAIN IS SAME AS THEN -  NO N/V /D.    NO FEVER.    HAS HAD U/S- IUP WITH [facility].

## 2018-05-08 NOTE — Discharge Instructions (Signed)
Abdominal Pain During Pregnancy Abdominal pain is common in pregnancy. Most of the time, it does not cause harm. There are many causes of abdominal pain. Some causes are more serious than others and sometimes the cause is not known. Abdominal pain can be a sign that something is very wrong with the pregnancy or the pain may have nothing to do with the pregnancy. Always tell your health care provider if you have any abdominal pain. Follow these instructions at home:  Do not have sex or put anything in your vagina until your symptoms go away completely.  Watch your abdominal pain for any changes.  Get plenty of rest until your pain improves.  Drink enough fluid to keep your urine clear or pale yellow.  Take over-the-counter or prescription medicines only as told by your health care provider.  Keep all follow-up visits as told by your health care provider. This is important. Contact a health care provider if:  You have a fever.  Your pain gets worse or you have cramping.  Your pain continues after resting. Get help right away if:  You are bleeding, leaking fluid, or passing tissue from the vagina.  You have vomiting or diarrhea that does not go away.  You have painful or bloody urination.  You notice a decrease in your baby's movements.  You feel very weak or faint.  You have shortness of breath.  You develop a severe headache with abdominal pain.  You have abnormal vaginal discharge with abdominal pain. This information is not intended to replace advice given to you by your health care provider. Make sure you discuss any questions you have with your health care provider. Document Released: 11/11/2005 Document Revised: 08/22/2016 Document Reviewed: 06/10/2013 Elsevier Interactive Patient Education  2018 [employer].  Back Pain, Adult Many adults have back pain from time to time. Common causes of back pain include:  A strained muscle or ligament.  Wear and tear  (degeneration) of the spinal disks.  Arthritis.  A hit to the back.  Back pain can be short-lived (acute) or last a long time (chronic). A physical exam, lab tests, and imaging studies may be done to find the cause of your pain. Follow these instructions at home: Managing pain and stiffness  Take over-the-counter and prescription medicines only as told by your health care provider.  If directed, apply heat to the affected area as often as told by your health care provider. Use the heat source that your health care provider recommends, such as a moist heat pack or a heating pad. ? Place a towel between your skin and the heat source. ? Leave the heat on for 20-30 minutes. ? Remove the heat if your skin turns bright red. This is especially important if you are unable to feel pain, heat, or cold. You have a greater risk of getting burned.  If directed, apply ice to the injured area: ? Put ice in a plastic bag. ? Place a towel between your skin and the bag. ? Leave the ice on for 20 minutes, 2-3 times a day for the first 2-3 days. Activity  Do not stay in bed. Resting more than 1-2 days can delay your recovery.  Take short walks on even surfaces as soon as you are able. Try to increase the length of time you walk each day.  Do not sit, drive, or stand in one place for more than 30 minutes at a time. Sitting or standing for long periods of time can  put stress on your back.  Use proper lifting techniques. When you bend and lift, use positions that put less stress on your back: ? [place] your knees. ? Keep the load close to your body. ? Avoid twisting.  Exercise regularly as told by your health care provider. Exercising will help your back heal faster. This also helps prevent back injuries by keeping muscles strong and flexible.  Your health care provider may recommend that you see a physical therapist. This person can help you come up with a safe exercise program. Do any exercises as told  by your physical therapist. Lifestyle  Maintain a healthy weight. Extra weight puts stress on your back and makes it difficult to have good posture.  Avoid activities or situations that make you feel anxious or stressed. Learn ways to manage anxiety and stress. One way to manage stress is through exercise. Stress and anxiety increase muscle tension and can make back pain worse. General instructions  Sleep on a firm mattress in a comfortable position. Try lying on your side with your knees slightly bent. If you lie on your back, put a pillow under your knees.  Follow your treatment plan as told by your health care provider. This may include: ? Cognitive or behavioral therapy. ? Acupuncture or massage therapy. ? Meditation or yoga. Contact a health care provider if:  You have pain that is not relieved with rest or medicine.  You have increasing pain going down into your legs or buttocks.  Your pain does not improve in 2 weeks.  You have pain at night.  You lose weight.  You have a fever or chills. Get help right away if:  You develop new bowel or bladder control problems.  You have unusual weakness or numbness in your arms or legs.  You develop nausea or vomiting.  You develop abdominal pain.  You feel faint. Summary  Many adults have back pain from time to time. A physical exam, lab tests, and imaging studies may be done to find the cause of your pain.  Use proper lifting techniques. When you bend and lift, use positions that put less stress on your back.  Take over-the-counter and prescription medicines and apply heat or ice as directed by your health care provider. This information is not intended to replace advice given to you by your health care provider. Make sure you discuss any questions you have with your health care provider. Document Released: 11/11/2005 Document Revised: 12/16/2016 Document Reviewed: 12/16/2016 Elsevier Interactive Patient Education  [employer].

## 2018-05-08 NOTE — Progress Notes (Signed)
Pt came to office and filled out application for [facility].  Makena application faxed.

## 2018-05-09 LAB — CULTURE, OB URINE: Culture: NO GROWTH

## 2018-05-12 ENCOUNTER — Telehealth: Payer: Self-pay | Admitting: General Practice

## 2018-05-12 NOTE — Telephone Encounter (Signed)
Patient called and left message on nurse voicemail line stating she was seen in MAU on Friday and would like her urine culture results. Patient states she is still having pain. Called patient, no answer- unable to leave message due to voicemail box is full. Will send mychart message.

## 2018-05-15 ENCOUNTER — Encounter (HOSPITAL_COMMUNITY): Payer: Self-pay

## 2018-05-15 ENCOUNTER — Inpatient Hospital Stay (HOSPITAL_COMMUNITY)
Admission: AD | Admit: 2018-05-15 | Discharge: 2018-05-15 | Disposition: A | Discharge status: Home / Self Care | Payer: Medicaid Other | Source: Ambulatory Visit | Attending: Obstetrics & Gynecology | Admitting: Obstetrics & Gynecology

## 2018-05-15 DIAGNOSIS — B009 Herpesviral infection, unspecified: Secondary | ICD-10-CM | POA: Diagnosis not present

## 2018-05-15 DIAGNOSIS — O26891 Other specified pregnancy related conditions, first trimester: Secondary | ICD-10-CM | POA: Insufficient documentation

## 2018-05-15 DIAGNOSIS — O26899 Other specified pregnancy related conditions, unspecified trimester: Secondary | ICD-10-CM

## 2018-05-15 DIAGNOSIS — Z79899 Other long term (current) drug therapy: Secondary | ICD-10-CM | POA: Insufficient documentation

## 2018-05-15 DIAGNOSIS — Z833 Family history of diabetes mellitus: Secondary | ICD-10-CM | POA: Insufficient documentation

## 2018-05-15 DIAGNOSIS — Z9889 Other specified postprocedural states: Secondary | ICD-10-CM | POA: Diagnosis not present

## 2018-05-15 DIAGNOSIS — Z79891 Long term (current) use of opiate analgesic: Secondary | ICD-10-CM | POA: Diagnosis not present

## 2018-05-15 DIAGNOSIS — M549 Dorsalgia, unspecified: Secondary | ICD-10-CM | POA: Insufficient documentation

## 2018-05-15 DIAGNOSIS — R109 Unspecified abdominal pain: Secondary | ICD-10-CM | POA: Diagnosis not present

## 2018-05-15 DIAGNOSIS — Z823 Family history of stroke: Secondary | ICD-10-CM | POA: Diagnosis not present

## 2018-05-15 DIAGNOSIS — Z91013 Allergy to seafood: Secondary | ICD-10-CM | POA: Diagnosis not present

## 2018-05-15 DIAGNOSIS — Z882 Allergy status to sulfonamides status: Secondary | ICD-10-CM | POA: Diagnosis not present

## 2018-05-15 DIAGNOSIS — Z3A11 11 weeks gestation of pregnancy: Secondary | ICD-10-CM | POA: Insufficient documentation

## 2018-05-15 DIAGNOSIS — Z8249 Family history of ischemic heart disease and other diseases of the circulatory system: Secondary | ICD-10-CM | POA: Insufficient documentation

## 2018-05-15 LAB — URINALYSIS, ROUTINE W REFLEX MICROSCOPIC
Bilirubin Urine: NEGATIVE
Glucose, UA: NEGATIVE mg/dL
Hgb urine dipstick: NEGATIVE
KETONES UR: NEGATIVE mg/dL
LEUKOCYTES UA: NEGATIVE
NITRITE: NEGATIVE
PH: 8 (ref 5.0–8.0)
PROTEIN: NEGATIVE mg/dL
Specific Gravity, Urine: 1.018 (ref 1.005–1.030)

## 2018-05-15 MED ORDER — ACETAMINOPHEN 500 MG PO TABS
1000.0000 mg | ORAL_TABLET | Freq: Four times a day (QID) | ORAL | Status: DC | PRN
  Administered 2018-05-15: 1000 mg via ORAL
  Filled 2018-05-15: qty 2

## 2018-05-15 NOTE — MAU Provider Note (Signed)
History     CSN: [id number]  Arrival date and time: 05/15/18 9811   First Provider Initiated Contact with Patient 05/15/18 1005      Chief Complaint  Patient presents with  . Abdominal Pain  . Back Pain  . vaginal pressure   G3P1101 @11 .5 wks here with cramping, vaginal pain, and pelvic pressure. Sx started 2-3 days ago. Cramping is lower abd and bilateral. Rates 5/10. Took Flexeril but didn't help. Having some vaginal discharge but is using Progesterone suppositories. No recent IC. Had some light spotting yesterday, saw on toilet paper. Denies urinary sx but has to lean forward to start stream. She has hx of second trimester loss and had cerclage scheduled in 3 days.   OB History    Gravida  3   Para  2   Term  1   Preterm  1   AB  0   Living  1     SAB  0   TAB  0   Ectopic  0   Multiple  0   Live Births  1           Past Medical History:  Diagnosis Date  . Abscess of breast   . Allergy   . Anxiety   . Asthma    as a child - no inhaler  . Bipolar 1 disorder ([place])   . Depression   . GERD (gastroesophageal reflux disease)    diet control  . HSV-2 (herpes simplex virus 2) infection   . Pelvic inflammatory disease   . Preterm labor   . SVD (spontaneous vaginal delivery)    x 2 - fetal demise at 21 weeks, 1 living    Past Surgical History:  Procedure Laterality Date  . CERVICAL CERCLAGE N/A 04/05/2015   Procedure: CERCLAGE CERVICAL;  Surgeon: Lavonia Drafts, MD;  Location: [facility];  Service: Gynecology;  Laterality: N/A;  . WISDOM TOOTH EXTRACTION      Family History  Problem Relation Age of Onset  . Diabetes Mother   . Stroke Mother   . Hypertension Father   . Heart attack Father   . Heart disease Father     Social History   Tobacco Use  . Smoking status: Former Smoker    Packs/day: 0.50    Years: 10.00    Pack years: 5.00    Types: Cigarettes    Last attempt to quit: 03/09/2018    Years since quitting: 0.1  . Smokeless  tobacco: Never Used  Substance Use Topics  . Alcohol use: Not Currently    Alcohol/week: 0.0 oz    Comment: occ but none with pregnancy  . Drug use: No    Allergies:  Allergies  Allergen Reactions  . Shellfish Allergy Hives  . Sulfa Antibiotics Other (See Comments)    Reaction:  Unknown; childhood reaction  . Icy Hot Rash    Medications Prior to Admission  Medication Sig Dispense Refill Last Dose  . acetaminophen (TYLENOL) 500 MG tablet Take 1 tablet (500 mg total) by mouth every 6 (six) hours as needed. (Patient taking differently: Take 500 mg by mouth every 6 (six) hours as needed for moderate pain. ) 30 tablet 0 Past Month at Unknown time  . cyclobenzaprine (FLEXERIL) 5 MG tablet Take 1 tablet (5 mg total) by mouth 3 (three) times daily as needed for muscle spasms. 15 tablet 0 Past Week at Unknown time  . Prenatal Vit-Fe Fumarate-FA (PRENATAL MULTIVITAMIN) TABS tablet Take 1 tablet by mouth  daily at 12 noon.   05/14/2018 at Unknown time  . progesterone (PROMETRIUM) 200 MG capsule Place one capsule vaginally at bedtime 30 capsule 3 05/14/2018 at Unknown time  . traMADol (ULTRAM) 50 MG tablet Take 1 tablet (50 mg total) by mouth every 6 (six) hours as needed. 15 tablet 0 Past Week at Unknown time  . hydroxyprogesterone caproate (MAKENA) 250 mg/mL OIL injection Inject 1 mL (250 mg total) into the muscle once for 1 dose. 1 mL 0   . valACYclovir (VALTREX) 1000 MG tablet Take 1 tablet (1,000 mg total) by mouth daily. (Patient not taking: Reported on 04/07/2018) 5 tablet 3 Not Taking    Review of Systems  Constitutional: Negative for fever.  Gastrointestinal: Positive for abdominal pain. Negative for constipation, diarrhea, nausea and vomiting.  Genitourinary: Positive for vaginal bleeding and vaginal discharge. Negative for dysuria, frequency, hematuria and urgency.   Physical Exam   Blood pressure 115/68, pulse 64, temperature 98.7 F (37.1 C), temperature source Oral, height 5\' 3"   (1.6 m), weight 174 lb (78.9 kg), last menstrual period 02/22/2018.  Physical Exam  Nursing note and vitals reviewed. Constitutional: She is oriented to person, place, and time. She appears well-developed and well-nourished. No distress.  HENT:  Head: Normocephalic and atraumatic.  Neck: Normal range of motion.  Cardiovascular: Normal rate.  Respiratory: Effort normal. No respiratory distress.  GI: Soft. She exhibits no distension and no mass. There is no tenderness. There is no rebound and no guarding.  Genitourinary:  Genitourinary Comments: External: no lesions or erythema Vagina: rugated, pink, moist, thick white discharge, no blood Cervix closed, long, anterior   Musculoskeletal: Normal range of motion.  Neurological: She is alert and oriented to person, place, and time.  Skin: Skin is warm and dry.  Psychiatric: She has a normal mood and affect.   Unable to get FHT by doppler>Limited bedside US: viable, active fetus, +cardiac activity, subj. nml AFV  Results for orders placed or performed during the hospital encounter of 05/15/18 (from the past 24 hour(s))  Urinalysis, Routine w reflex microscopic     Status: Abnormal   Collection Time: 05/15/18  9:52 AM  Result Value Ref Range   Color, Urine YELLOW YELLOW   APPearance CLOUDY (A) CLEAR   Specific Gravity, Urine 1.018 1.005 - 1.030   pH 8.0 5.0 - 8.0   Glucose, UA NEGATIVE NEGATIVE mg/dL   Hgb urine dipstick NEGATIVE NEGATIVE   Bilirubin Urine NEGATIVE NEGATIVE   Ketones, ur NEGATIVE NEGATIVE mg/dL   Protein, ur NEGATIVE NEGATIVE mg/dL   Nitrite NEGATIVE NEGATIVE   Leukocytes, UA NEGATIVE NEGATIVE   MAU Course  Procedures Tylenol  MDM Labs ordered and reviewed. Recent STD screen neg. No evidence of cervical changes, pelvic infection, or UTI. Pain improved after Tylenol. Stable for discharge home.  Assessment and Plan   1. [redacted] weeks gestation of pregnancy   2. Abdominal cramping affecting pregnancy    Discharge  home Pelvic rest SAB precautions Follow up as scheduled for surgery next week  Allergies as of 05/15/2018      Reactions   Shellfish Allergy Hives   Sulfa Antibiotics Other (See Comments)   Reaction:  Unknown; childhood reaction   Icy Hot Rash      Medication List    STOP taking these medications   valACYclovir 1000 MG tablet Commonly known as:  VALTREX     TAKE these medications   acetaminophen 500 MG tablet Commonly known as:  TYLENOL Take 1 tablet (  500 mg total) by mouth every 6 (six) hours as needed. What changed:  reasons to take this   cyclobenzaprine 5 MG tablet Commonly known as:  FLEXERIL Take 1 tablet (5 mg total) by mouth 3 (three) times daily as needed for muscle spasms.   hydroxyprogesterone caproate 250 mg/mL Oil injection Commonly known as:  MAKENA Inject 1 mL (250 mg total) into the muscle once for 1 dose.   prenatal multivitamin Tabs tablet Take 1 tablet by mouth daily at 12 noon.   progesterone 200 MG capsule Commonly known as:  PROMETRIUM Place one capsule vaginally at bedtime   traMADol 50 MG tablet Commonly known as:  ULTRAM Take 1 tablet (50 mg total) by mouth every 6 (six) hours as needed.      Julianne Handler, CNM 05/15/2018, 11:50 AM

## 2018-05-15 NOTE — Discharge Instructions (Signed)

## 2018-05-15 NOTE — MAU Note (Signed)
Pt C/O lower abd & back pain for the past week, now having vaginal & rectal pressure.  Had spotting yesterday morning, none since.  Is using progesterone suppositories.  Cerclage scheduled for Monday, hx of incompetent cervix.

## 2018-05-18 ENCOUNTER — Encounter (HOSPITAL_COMMUNITY)
Admission: AD | Disposition: A | Discharge status: Home / Self Care | Payer: Self-pay | Source: Ambulatory Visit | Attending: Obstetrics and Gynecology

## 2018-05-18 ENCOUNTER — Ambulatory Visit (HOSPITAL_COMMUNITY)
Admission: AD | Admit: 2018-05-18 | Discharge: 2018-05-18 | Disposition: A | Discharge status: Home / Self Care | Payer: Medicaid Other | Source: Ambulatory Visit | Attending: Obstetrics and Gynecology | Admitting: Obstetrics and Gynecology

## 2018-05-18 ENCOUNTER — Ambulatory Visit (HOSPITAL_COMMUNITY): Payer: Medicaid Other | Admitting: Anesthesiology

## 2018-05-18 ENCOUNTER — Encounter (HOSPITAL_COMMUNITY): Payer: Self-pay | Admitting: Anesthesiology

## 2018-05-18 DIAGNOSIS — Z3A12 12 weeks gestation of pregnancy: Secondary | ICD-10-CM | POA: Diagnosis not present

## 2018-05-18 DIAGNOSIS — Z87891 Personal history of nicotine dependence: Secondary | ICD-10-CM | POA: Diagnosis not present

## 2018-05-18 DIAGNOSIS — O3431 Maternal care for cervical incompetence, first trimester: Secondary | ICD-10-CM

## 2018-05-18 DIAGNOSIS — O09291 Supervision of pregnancy with other poor reproductive or obstetric history, first trimester: Secondary | ICD-10-CM

## 2018-05-18 DIAGNOSIS — Z79899 Other long term (current) drug therapy: Secondary | ICD-10-CM | POA: Insufficient documentation

## 2018-05-18 DIAGNOSIS — O09211 Supervision of pregnancy with history of pre-term labor, first trimester: Secondary | ICD-10-CM | POA: Diagnosis not present

## 2018-05-18 DIAGNOSIS — O99211 Obesity complicating pregnancy, first trimester: Secondary | ICD-10-CM | POA: Insufficient documentation

## 2018-05-18 HISTORY — PX: CERVICAL CERCLAGE: SHX1329

## 2018-05-18 HISTORY — DX: Depression, unspecified: F32.A

## 2018-05-18 HISTORY — DX: Gastro-esophageal reflux disease without esophagitis: K21.9

## 2018-05-18 HISTORY — DX: Major depressive disorder, single episode, unspecified: F32.9

## 2018-05-18 SURGERY — CERCLAGE, CERVIX, VAGINAL APPROACH
Anesthesia: Spinal

## 2018-05-18 MED ORDER — LACTATED RINGERS IV SOLN
INTRAVENOUS | Status: DC
  Administered 2018-05-18 (×2): via INTRAVENOUS

## 2018-05-18 MED ORDER — DOCUSATE SODIUM 100 MG PO CAPS: 100.0000 mg | ORAL_CAPSULE | Freq: Two times a day (BID) | ORAL | 2 refills | Status: DC | PRN

## 2018-05-18 MED ORDER — ONDANSETRON HCL 4 MG/2ML IJ SOLN: 4.0000 mg | Freq: Once | INTRAMUSCULAR | Status: DC | PRN

## 2018-05-18 MED ORDER — FENTANYL CITRATE (PF) 100 MCG/2ML IJ SOLN: 25.0000 ug | INTRAMUSCULAR | Status: DC | PRN

## 2018-05-18 MED ORDER — 0.9 % SODIUM CHLORIDE (POUR BTL) OPTIME
TOPICAL | Status: DC | PRN
  Administered 2018-05-18: 1000 mL

## 2018-05-18 MED ORDER — BUPIVACAINE IN DEXTROSE 0.75-8.25 % IT SOLN
INTRATHECAL | Status: DC | PRN
  Administered 2018-05-18: 1.2 mL via INTRATHECAL

## 2018-05-18 MED ORDER — OXYCODONE-ACETAMINOPHEN 5-325 MG PO TABS: 1.0000 | ORAL_TABLET | Freq: Four times a day (QID) | ORAL | 0 refills | Status: DC | PRN

## 2018-05-18 SURGICAL SUPPLY — 16 items
CANISTER SUCT 3000ML PPV (MISCELLANEOUS) ×4 IMPLANT
GLOVE BIOGEL PI IND STRL 6.5 (GLOVE) ×1 IMPLANT
GLOVE BIOGEL PI IND STRL 7.0 (GLOVE) ×1 IMPLANT
GLOVE BIOGEL PI INDICATOR 6.5 (GLOVE) ×1
GLOVE BIOGEL PI INDICATOR 7.0 (GLOVE) ×1
GLOVE SURG SS PI 6.5 STRL IVOR (GLOVE) ×2 IMPLANT
GOWN STRL REUS W/TWL LRG LVL3 (GOWN DISPOSABLE) ×4 IMPLANT
NS IRRIG 1000ML POUR BTL (IV SOLUTION) ×2 IMPLANT
PACK VAGINAL MINOR WOMEN LF (CUSTOM PROCEDURE TRAY) ×2 IMPLANT
PAD OB MATERNITY 4.3X12.25 (PERSONAL CARE ITEMS) ×2 IMPLANT
PAD PREP 24X48 CUFFED NSTRL (MISCELLANEOUS) ×2 IMPLANT
SUT PROLENE 0 CT 1 30 (SUTURE) ×2 IMPLANT
TOWEL OR 17X24 6PK STRL BLUE (TOWEL DISPOSABLE) ×4 IMPLANT
TRAY FOLEY W/BAG SLVR 14FR (SET/KITS/TRAYS/PACK) ×2 IMPLANT
TUBING NON-CON 1/4 X 20 CONN (TUBING) ×2 IMPLANT
YANKAUER SUCT BULB TIP NO VENT (SUCTIONS) ×2 IMPLANT

## 2018-05-18 NOTE — Anesthesia Procedure Notes (Addendum)
Spinal  Patient location during procedure: OR Start time: 05/18/2018 9:39 AM Staffing Anesthesiologist: Josephine Igo, MD Performed: anesthesiologist  Preanesthetic Checklist Completed: patient identified, site marked, surgical consent, pre-op evaluation, timeout performed, IV checked, risks and benefits discussed and monitors and equipment checked Spinal Block Patient position: sitting Prep: site prepped and draped and DuraPrep Patient monitoring: heart rate, cardiac monitor, continuous pulse ox and blood pressure Approach: midline Location: L3-4 Injection technique: single-shot Needle Needle type: Pencan  Needle gauge: 24 G Needle length: 9 cm Needle insertion depth: 7 cm Assessment Sensory level: T4 Additional Notes Multiple attempts due to patient moving and jumping on table. Adequate sensory level.

## 2018-05-18 NOTE — Transfer of Care (Signed)
Immediate Anesthesia Transfer of Care Note  Patient: Kim Johns  Procedure(s) Performed: CERCLAGE CERVICAL (N/A )  Patient Location: PACU  Anesthesia Type:Spinal  Level of Consciousness: awake, alert  and oriented  Airway & Oxygen Therapy: Patient Spontanous Breathing  Post-op Assessment: Report given to RN and Post -op Vital signs reviewed and stable  Post vital signs: Reviewed and stable  Last Vitals:  Vitals Value Taken Time  BP 112/67 05/18/2018 10:15 AM  Temp    Pulse 58 05/18/2018 10:19 AM  Resp 16 05/18/2018 10:19 AM  SpO2 100 % 05/18/2018 10:19 AM  Vitals shown include unvalidated device data.  Last Pain:  Vitals:   05/18/18 0908  TempSrc: Oral      Patients Stated Pain Goal: 5 (53/74/82 7078)  Complications: No apparent anesthesia complications

## 2018-05-18 NOTE — Op Note (Signed)
Kim Johns   PROCEDURE DATE: 05/18/2018  PREOPERATIVE DIAGNOSIS: Intrauterine pregnancy at [redacted]w[redacted]d, history of cervical incompetence   POSTOPERATIVE DIAGNOSIS: The same PROCEDURE: Transvaginal McDonald Cervical Cerclage Placement SURGEON:  Dr. Mora Bellman  INDICATIONS: 27 y.o. G3P1101 at [redacted]w[redacted]d with history of cervical incompetence, here for cerclage placement.   The risks of surgery were discussed in detail with the patient including but not limited to: bleeding; infection which may require antibiotic therapy; injury to cervix, vagina other surrounding organs; risk of ruptured membranes and/or preterm delivery and other postoperative or anesthesia complications.  Written informed consent was obtained.    FINDINGS:  About 2 cm palpable cervical length in the vagina, closed cervix, suture knot placed anteriorly. Prolapsed cervix at the level of introitus  ANESTHESIA:  Spinal INTRAVENOUS FLUIDS: 2000  ml ESTIMATED BLOOD LOSS: 20 ml COMPLICATIONS: None immediate  PROCEDURE IN DETAIL:  The patient had sequential compression devices applied to her lower extremities while in the preoperative area.  Reassuring fetal heart rate was also obtained using a doppler. She was then taken to the operating room where spinal anesthesia was administered and was found to be adequate.  She was placed in the dorsal lithotomy, and was prepped and draped in a sterile manner. Her bladder was catheterized for 125 cc of clear, yellow urine.  After an adequate timeout was performed, a vaginal speculum was then placed in the patient's vagina and a single tooth tenaculum was applied to the anterior lip of the cervix.  A paracervical block using 1% Marcaine was administered.  The anterior and posterior lips of the cervix was grasped with ring forceps. A curved needle loaded with a number 1 Prolene suture was inserted at 12 o'clock, as high as possible at the junction of the rugated vaginal epithelium and the smooth cervix, at  least 2 cm above the external os.  Four bites are taken circumferentially around the entire cervix in a purse-string fashion, each bite should be deep enough to extend at least midway into the cervical stroma, but not into the endocervical canal. The two ends of the suture were then tied securely anteriorly and cut, leaving the ends long enough to grasp with a clamp when it is time to remove it. There was minimal bleeding noted and the ring forceps were removed with good hemostasis noted.  All instruments were removed from the patient's vagina.  Instrument, needle and sponge counts were correct x 2. The patient tolerated the procedure well, and was taken to the recovery area awake and in stable condition. Reassuring fetal heart rate was also obtained using a doppler in the recovery area.  The patient will be discharged to home as per PACU criteria.  Routine postoperative instructions given.  She was prescribed Percocet and Colace.  She will follow up in the clinic on 05/19/2018 for postoperative evaluation and ongoing prenatal care

## 2018-05-18 NOTE — H&P (Signed)
Kim Johns is a 27 y.o. female G3P1011 at [redacted]w[redacted]d presenting for scheduled prophylactic cerclage. Patient reports feeling well and denies any cramping or vaginal bleeding. Patient with prenatal care at [place] complicated by a history of incompetent cervix in her first pregnancy which required a rescue cerclage and history of HSV infection.   OB History    Gravida  3   Para  2   Term  1   Preterm  1   AB  0   Living  1     SAB  0   TAB  0   Ectopic  0   Multiple  0   Live Births  1          Past Medical History:  Diagnosis Date  . Abscess of breast   . Allergy   . Anxiety   . Asthma    as a child - no inhaler  . Bipolar 1 disorder ([place])   . Depression   . GERD (gastroesophageal reflux disease)    diet control  . HSV-2 (herpes simplex virus 2) infection   . Pelvic inflammatory disease   . Preterm labor   . SVD (spontaneous vaginal delivery)    x 2 - fetal demise at 21 weeks, 1 living   Past Surgical History:  Procedure Laterality Date  . CERVICAL CERCLAGE N/A 04/05/2015   Procedure: CERCLAGE CERVICAL;  Surgeon: Lavonia Drafts, MD;  Location: [facility];  Service: Gynecology;  Laterality: N/A;  . WISDOM TOOTH EXTRACTION     Family History: family history includes Diabetes in her mother; Heart attack in her father; Heart disease in her father; Hypertension in her father; Stroke in her mother. Social History:  reports that she quit smoking about 2 months ago. Her smoking use included cigarettes. She has a 5.00 pack-year smoking history. She has never used smokeless tobacco. She reports that she drank alcohol. She reports that she does not use drugs.     Maternal Diabetes: No Genetic Screening: not yet collected   ROS  See pertinent in HPI History   Height 5\' 3"  (1.6 m), weight 170 lb (77.1 kg), last menstrual period 02/22/2018. Exam Physical Exam  GENERAL: Well-developed, well-nourished female in no acute distress.  LUNGS: Clear to auscultation  bilaterally.  HEART: Regular rate and rhythm. ABDOMEN: Soft, nontender, nondistended. No organomegaly. PELVIC: Deferred to OR EXTREMITIES: No cyanosis, clubbing, or edema, 2+ distal pulses.  Prenatal labs: ABO, Rh: O/Positive/-- (05/28 1144) Antibody: Negative (05/28 1144) Rubella: 12.20 (05/28 1144) RPR: Non Reactive (05/28 1144)  HBsAg: Negative (05/28 1144)  HIV: Non Reactive (05/28 1144)  GBS:     04/2018 ultrasound FINDINGS: Intrauterine gestational sac: Single  Yolk sac:  Not visible  Embryo:  Visible  Cardiac Activity: Detected  Heart Rate: 167 bpm  CRL:   40.6 mm   10 w 6 d                  [place] EDC: 11/28/2018  Subchorionic hemorrhage:  None visualized.  Maternal uterus/adnexae: The right ovary appears normal measuring 3.3 x 1.8 x 1.4 centimeters. The left ovary appears normal measuring 3.3 x 2.2 x 1.7 centimeters.  The cervix appears long and closed on image 16. No pelvic free fluid.  IMPRESSION: Single living IUP demonstrated with consistent estimated gestational age to the ultrasound 04/07/2018.  No acute maternal findings visualized.   Electronically Signed   By: Genevie Ann M.D.   On: 05/08/2018 06:37  Assessment/Plan: 27 yo G3P1101 at  12 weeks with history of incompetent cervix here for prophylactic cerclage placement.  - Risks, benefits and alternatives were explained including but not limited to risks of bleeding, infection, rupture of membrane resulting in loss of pregnancy and damage to adjacent organs. Patient verbalized understanding and all questions were answered   Jordan Pardini 05/18/2018, 8:52 AM

## 2018-05-18 NOTE — Anesthesia Postprocedure Evaluation (Signed)
Anesthesia Post Note  Patient: Farris Blash  Procedure(s) Performed: CERCLAGE CERVICAL (N/A )     Patient location during evaluation: PACU Anesthesia Type: Spinal Level of consciousness: oriented and awake and alert Pain management: pain level controlled Vital Signs Assessment: post-procedure vital signs reviewed and stable Respiratory status: spontaneous breathing, respiratory function stable and nonlabored ventilation Cardiovascular status: blood pressure returned to baseline and stable Postop Assessment: no headache, no backache, no apparent nausea or vomiting, spinal receding and patient able to bend at knees Anesthetic complications: no    Last Vitals:  Vitals:   05/18/18 1100 05/18/18 1115  BP: 98/61 105/60  Pulse: 67 67  Resp: 18 (!) 22  Temp:    SpO2: 100% 100%    Last Pain:  Vitals:   05/18/18 1115  TempSrc:   PainSc: 0-No pain   Pain Goal: Patients Stated Pain Goal: 5 (05/18/18 0908)               Winni Ehrhard A.

## 2018-05-18 NOTE — Anesthesia Preprocedure Evaluation (Addendum)
Anesthesia Evaluation  Patient identified by MRN, date of birth, ID band Patient awake    Reviewed: Allergy & Precautions, NPO status , Patient's Chart, lab work & pertinent test results  Airway Mallampati: I  TM Distance: >3 FB Neck ROM: Full    Dental no notable dental hx. (+) Teeth Intact   Pulmonary asthma , former smoker,  In childhood- no recent attacks   Pulmonary exam normal breath sounds clear to auscultation       Cardiovascular negative cardio ROS Normal cardiovascular exam Rhythm:Regular Rate:Normal     Neuro/Psych PSYCHIATRIC DISORDERS Anxiety Depression Bipolar Disorder    GI/Hepatic Neg liver ROS, GERD  ,  Endo/Other  Obesity  Renal/GU negative Renal ROS  negative genitourinary   Musculoskeletal negative musculoskeletal ROS (+)   Abdominal (+) + obese,   Peds  Hematology   Anesthesia Other Findings   Reproductive/Obstetrics (+) Pregnancy Incompetent Cervix HSV Hx/o Preterm delivery                            Anesthesia Physical Anesthesia Plan  ASA: II  Anesthesia Plan: Spinal   Post-op Pain Management:    Induction:   PONV Risk Score and Plan: 4 or greater and Scopolamine patch - Pre-op, Ondansetron, Dexamethasone and Treatment may vary due to age or medical condition  Airway Management Planned: Natural Airway  Additional Equipment:   Intra-op Plan:   Post-operative Plan:   Informed Consent: I have reviewed the patients History and Physical, chart, labs and discussed the procedure including the risks, benefits and alternatives for the proposed anesthesia with the patient or authorized representative who has indicated his/her understanding and acceptance.   Dental advisory given  Plan Discussed with: CRNA and Surgeon  Anesthesia Plan Comments:         Anesthesia Quick Evaluation

## 2018-05-18 NOTE — Discharge Instructions (Signed)
[facility] - Preparing for Surgery  Before surgery, you can play an important role.  Because skin is not sterile, your skin needs to be as free of germs as possible.  You can reduce the number of germs on you skin by washing with CHG (chlorahexidine gluconate) soap before surgery.  CHG is an antiseptic cleaner which kills germs and bonds with the skin to continue killing germs even after washing.  Oral Hygiene is also important in reducing the risk of infection.  Remember to brush your teeth with your regular toothpaste the morning of surgery.  Please DO NOT use if you have an allergy to CHG or antibacterial soaps.  If your skin becomes reddened/irritated stop using the CHG and inform your nurse when you arrive at Short Stay.  Do not shave (including legs and underarms) for at least 48 hours prior to the first CHG shower.  You may shave your face.  Please follow these instructions carefully:   1.  Shower with CHG Soap the night before surgery and the morning of Surgery.  2.  If you choose to wash your hair, wash your hair first as usual with your normal shampoo.  3.  After you shampoo, rinse your hair and body thoroughly to remove the shampoo. 4.  Use CHG as you would any other liquid soap.  You can apply chg directly to the skin and wash gently with a      scrungie or washcloth.           5.  Apply the CHG Soap to your body ONLY FROM THE NECK DOWN.   Do not use on open wounds or open sores. Avoid contact with your eyes, ears, mouth and genitals (private parts).  Wash genitals (private parts) with your normal soap.  6.  Wash thoroughly, paying special attention to the area where your surgery will be performed.  7.  Thoroughly rinse your body with warm water from the neck down.  8.  DO NOT shower/wash with your normal soap after using and rinsing off the CHG Soap.  9.  Pat yourself dry with a clean towel.            10.  Wear clean pajamas.            11.  Place clean sheets on your bed the  night of your first shower and do not sleep with pets.  Day of Surgery  Do not apply any lotions/deoderants the morning of surgery.   Please wear clean clothes to the hospital/surgery center. Remember to brush your teeth with toothpaste.  Cervical Cerclage, Care After This sheet gives you information about how to care for yourself after your procedure. Your health care provider may also give you more specific instructions. If you have problems or questions, contact your health care provider. What can I expect after the procedure? After your procedure, it is common to have:  Cramping in your abdomen.  Mucus discharge for several days.  Painful urination (dysuria).  Small drops of blood coming from your vagina (spotting).  Follow these instructions at home:  Follow instructions from your health care provider about bed rest, if this applies. You may need to be on bed rest for up to 3 days.  Take over-the-counter and prescription medicines only as told by your health care provider.  Do not drive or use heavy machinery while taking prescription pain medicine.  Keep track of your vaginal discharge and watch for any changes. If you notice  changes, tell your health care provider.  Avoid physical activities and exercise until your health care provider approves. Ask your health care provider what activities are safe for you.  Until your health care provider approves: ? Do not douche. ? Do not have sexual intercourse.  Keep all pre-birth (prenatal) visits and all follow-up visits as told by your health care provider. This is important. You will probably have weekly visits to have your cervix checked, and you may need an ultrasound. Contact a health care provider if:  You have abnormal or bad-smelling vaginal discharge, such as clots.  You develop a rash on your skin. This may look like redness and swelling.  You become light-headed or feel like you are going to faint.  You have  abdominal pain that does not get better with medicine.  You have persistent nausea or vomiting. Get help right away if:  You have vaginal bleeding that is heavier or more frequent than spotting.  You are leaking fluid or have a gush of fluid from your vagina (your water breaks).  You have a fever or chills.  You faint.  You have uterine contractions. These may feel like: ? A back ache. ? Lower abdominal pain. ? Mild cramps, similar to menstrual cramps. ? Tightening or pressure in your abdomen.  You think that your baby is not moving as much as usual, or you cannot feel your baby move.  You have chest pain.  You have shortness of breath. This information is not intended to replace advice given to you by your health care provider. Make sure you discuss any questions you have with your health care provider. Document Released: 09/01/2013 Document Revised: 07/10/2016 Document Reviewed: 06/14/2016 Elsevier Interactive Patient Education  [employer].

## 2018-05-19 ENCOUNTER — Encounter (HOSPITAL_COMMUNITY): Payer: Self-pay | Admitting: Obstetrics and Gynecology

## 2018-05-19 ENCOUNTER — Ambulatory Visit (INDEPENDENT_AMBULATORY_CARE_PROVIDER_SITE_OTHER): Payer: Medicaid Other | Admitting: Obstetrics and Gynecology

## 2018-05-19 VITALS — BP 123/75 | HR 66 | Wt 178.0 lb

## 2018-05-19 DIAGNOSIS — O0990 Supervision of high risk pregnancy, unspecified, unspecified trimester: Secondary | ICD-10-CM

## 2018-05-19 DIAGNOSIS — Z141 Cystic fibrosis carrier: Secondary | ICD-10-CM

## 2018-05-19 DIAGNOSIS — O0991 Supervision of high risk pregnancy, unspecified, first trimester: Secondary | ICD-10-CM

## 2018-05-19 DIAGNOSIS — O09299 Supervision of pregnancy with other poor reproductive or obstetric history, unspecified trimester: Secondary | ICD-10-CM

## 2018-05-19 DIAGNOSIS — O09291 Supervision of pregnancy with other poor reproductive or obstetric history, first trimester: Secondary | ICD-10-CM

## 2018-05-19 NOTE — Patient Instructions (Signed)

## 2018-05-20 ENCOUNTER — Telehealth: Payer: Self-pay | Admitting: *Deleted

## 2018-05-20 DIAGNOSIS — O09299 Supervision of pregnancy with other poor reproductive or obstetric history, unspecified trimester: Secondary | ICD-10-CM

## 2018-05-20 DIAGNOSIS — O0990 Supervision of high risk pregnancy, unspecified, unspecified trimester: Secondary | ICD-10-CM

## 2018-05-20 NOTE — Telephone Encounter (Signed)
Erma Heritage called and left a message this am she had a cerclage on Monday and had an appointment with a provider yesterday she never saw before and was told she wouldn't start getting cervical length ultrasounds until about 23 weeks. She states she was concerned because that is about 11 weeks away and she works a lot and is worried if her cervix starts shortening.  She wants to know if she can get a second opinion and a call back.    I discussed with Dr. Ilda Basset and called patient and informed her that actually she will get her cervical length at her anatomy ultrasound at 19 weeks and then depending on the results of that ultrasound will determine when her next one is. I informed her I see that her anatomy US is not scheduled and I will schedule that and she will get a notification on MyChart.  I also informed her if she has pain, bleeding, contractions she can call to be seen or go to MAU at anytime. We also discussed this time she got cerclage early in pregnancy which we hope helps decrease issues. She voices understanding. `

## 2018-05-22 ENCOUNTER — Inpatient Hospital Stay (HOSPITAL_COMMUNITY)
Admission: AD | Admit: 2018-05-22 | Discharge: 2018-05-22 | Disposition: A | Discharge status: Home / Self Care | Payer: Medicaid Other | Source: Ambulatory Visit | Attending: Family Medicine | Admitting: Family Medicine

## 2018-05-22 ENCOUNTER — Encounter (HOSPITAL_COMMUNITY): Payer: Self-pay | Admitting: *Deleted

## 2018-05-22 ENCOUNTER — Other Ambulatory Visit: Payer: Self-pay

## 2018-05-22 DIAGNOSIS — Z3A12 12 weeks gestation of pregnancy: Secondary | ICD-10-CM | POA: Diagnosis not present

## 2018-05-22 DIAGNOSIS — R102 Pelvic and perineal pain: Secondary | ICD-10-CM | POA: Insufficient documentation

## 2018-05-22 DIAGNOSIS — N812 Incomplete uterovaginal prolapse: Secondary | ICD-10-CM | POA: Diagnosis not present

## 2018-05-22 DIAGNOSIS — Z79899 Other long term (current) drug therapy: Secondary | ICD-10-CM | POA: Insufficient documentation

## 2018-05-22 DIAGNOSIS — Z882 Allergy status to sulfonamides status: Secondary | ICD-10-CM | POA: Insufficient documentation

## 2018-05-22 DIAGNOSIS — K6289 Other specified diseases of anus and rectum: Secondary | ICD-10-CM | POA: Insufficient documentation

## 2018-05-22 DIAGNOSIS — O26891 Other specified pregnancy related conditions, first trimester: Secondary | ICD-10-CM | POA: Diagnosis present

## 2018-05-22 DIAGNOSIS — M545 Low back pain: Secondary | ICD-10-CM | POA: Insufficient documentation

## 2018-05-22 DIAGNOSIS — Z87891 Personal history of nicotine dependence: Secondary | ICD-10-CM | POA: Diagnosis not present

## 2018-05-22 DIAGNOSIS — N814 Uterovaginal prolapse, unspecified: Secondary | ICD-10-CM | POA: Diagnosis not present

## 2018-05-22 LAB — URINALYSIS, ROUTINE W REFLEX MICROSCOPIC
BILIRUBIN URINE: NEGATIVE
Glucose, UA: NEGATIVE mg/dL
Hgb urine dipstick: NEGATIVE
KETONES UR: NEGATIVE mg/dL
Leukocytes, UA: NEGATIVE
NITRITE: NEGATIVE
Protein, ur: NEGATIVE mg/dL
SPECIFIC GRAVITY, URINE: 1.011 (ref 1.005–1.030)
pH: 7 (ref 5.0–8.0)

## 2018-05-22 MED ORDER — FLEET ENEMA 7-19 GM/118ML RE ENEM: 1.0000 | ENEMA | Freq: Once | RECTAL | 0 refills | Status: AC

## 2018-05-22 MED ORDER — FLEET ENEMA 7-19 GM/118ML RE ENEM
1.0000 | ENEMA | Freq: Once | RECTAL | Status: AC
  Administered 2018-05-22: 1 via RECTAL

## 2018-05-22 MED ORDER — POLYETHYLENE GLYCOL 3350 17 G PO PACK: 17.0000 g | PACK | Freq: Every day | ORAL | 0 refills | Status: DC

## 2018-05-22 NOTE — MAU Provider Note (Signed)
History   Patient Kim Johns is a 27 y.o. G3P1101 At [redacted]w[redacted]d here with complaints of on-going pelvic pain, pain in her lower back, pain her thighs and rectal pain.  She denies vaginal bleeding, abnormal discharge, dysuria, NV or other ob-gyn complaint.  CSN: [id number]  Arrival date and time: 05/22/18 2002   None     No chief complaint on file.  Pelvic Pain  The patient's primary symptoms include pelvic pain. This is a recurrent problem. The current episode started 1 to 4 weeks ago. The problem occurs constantly. The problem has been unchanged. Associated symptoms include constipation. Pertinent negatives include no chills, diarrhea, dysuria, fever, urgency or vomiting. Nothing aggravates the symptoms.   She had a small bowel movement today and then her last BM was on Monday. She is scared to push and so she has avoided straining.  OB History    Gravida  3   Para  2   Term  1   Preterm  1   AB  0   Living  1     SAB  0   TAB  0   Ectopic  0   Multiple  0   Live Births  1           Past Medical History:  Diagnosis Date  . Abscess of breast   . Allergy   . Anxiety   . Asthma    as a child - no inhaler  . Bipolar 1 disorder ([place])   . Depression   . GERD (gastroesophageal reflux disease)    diet control  . HSV-2 (herpes simplex virus 2) infection   . Pelvic inflammatory disease   . Preterm labor   . SVD (spontaneous vaginal delivery)    x 2 - fetal demise at 21 weeks, 1 living    Past Surgical History:  Procedure Laterality Date  . CERVICAL CERCLAGE N/A 04/05/2015   Procedure: CERCLAGE CERVICAL;  Surgeon: Lavonia Drafts, MD;  Location: [facility];  Service: Gynecology;  Laterality: N/A;  . CERVICAL CERCLAGE N/A 05/18/2018   Procedure: CERCLAGE CERVICAL;  Surgeon: Mora Bellman, MD;  Location: [facility];  Service: Gynecology;  Laterality: N/A;  . WISDOM TOOTH EXTRACTION      Family History  Problem Relation Age of Onset  . Diabetes Mother   .  Stroke Mother   . Hypertension Father   . Heart attack Father   . Heart disease Father     Social History   Tobacco Use  . Smoking status: Former Smoker    Packs/day: 0.50    Years: 10.00    Pack years: 5.00    Types: Cigarettes    Last attempt to quit: 03/09/2018    Years since quitting: 0.2  . Smokeless tobacco: Never Used  Substance Use Topics  . Alcohol use: Not Currently    Alcohol/week: 0.0 oz    Comment: occ but none with pregnancy  . Drug use: No    Allergies:  Allergies  Allergen Reactions  . Shellfish Allergy Hives  . Sulfa Antibiotics Other (See Comments)    Reaction:  Unknown; childhood reaction  . Icy Hot Rash    Medications Prior to Admission  Medication Sig Dispense Refill Last Dose  . cyclobenzaprine (FLEXERIL) 5 MG tablet Take 1 tablet (5 mg total) by mouth 3 (three) times daily as needed for muscle spasms. 15 tablet 0 Past Month at Unknown time  . docusate sodium (COLACE) 100 MG capsule Take 1 capsule (100 mg  total) by mouth 2 (two) times daily as needed. 30 capsule 2 Past Week at Unknown time  . oxyCODONE-acetaminophen (PERCOCET/ROXICET) 5-325 MG tablet Take 1-2 tablets by mouth every 6 (six) hours as needed. (Patient taking differently: Take 1-2 tablets by mouth every 6 (six) hours as needed for moderate pain. ) 15 tablet 0 Past Week at Unknown time  . Prenatal Vit-Fe Fumarate-FA (PRENATAL MULTIVITAMIN) TABS tablet Take 1 tablet by mouth daily at 12 noon.   05/21/2018 at Unknown time  . progesterone (PROMETRIUM) 200 MG capsule Place one capsule vaginally at bedtime (Patient taking differently: Place 200 mg vaginally at bedtime. Place one capsule vaginally at bedtime) 30 capsule 3 05/21/2018 at Unknown time  . traMADol (ULTRAM) 50 MG tablet Take 50 mg by mouth every 6 (six) hours as needed for moderate pain.   Past Week at Unknown time  . hydroxyprogesterone caproate (MAKENA) 250 mg/mL OIL injection Inject 1 mL (250 mg total) into the muscle once for 1 dose. 1  mL 0     Review of Systems  Constitutional: Negative for chills and fever.  Gastrointestinal: Positive for constipation. Negative for diarrhea and vomiting.  Genitourinary: Positive for pelvic pain. Negative for dysuria and urgency.   Physical Exam   Blood pressure 128/63, pulse 71, temperature 99.2 F (37.3 C), temperature source Oral, resp. rate 18, last menstrual period 02/22/2018.  Physical Exam  Constitutional: She is oriented to person, place, and time. She appears well-developed.  HENT:  Head: Normocephalic.  Eyes: Pupils are equal, round, and reactive to light.  Neck: Normal range of motion.  GI: Soft.  Genitourinary:  Genitourinary Comments: Normal external female genitalia; anterior cervix is visible at the introitus;  Cerclage stitches visualized. No blood in vagina, bimanual exam deferred.   Musculoskeletal: Normal range of motion.  Neurological: She is alert and oriented to person, place, and time.  Skin: Skin is warm and dry.    MAU Course  Procedures  MDM -Enema in MAU; patient feels better.  -bimanual exam deferred FHR is 160 by doppler -Discussed with Dr. Kennon Rounds, who agrees that pessary placement is recommended.  -Dr. Kennon Rounds also discussed pessary with patient while she was in MAU.   Assessment and Plan   1. Cervical prolapse    2. Patient stable for discharge with RX for Miralax and repeat enema in the morning.   3. Msg sent to clinic for appt for pessary placement as soon as possible; patient knows we will wait for her phone call.   4. All questions answered.   Mervyn Skeeters Kooistra 05/22/2018, 8:53 PM

## 2018-05-22 NOTE — Discharge Instructions (Signed)
Pelvic Organ Prolapse Pelvic organ prolapse is the stretching, bulging, or dropping of pelvic organs into an abnormal position. It happens when the muscles and tissues that surround and support pelvic structures are stretched or weak. Pelvic organ prolapse can involve:  Vagina (vaginal prolapse).  Uterus (uterine prolapse).  Bladder (cystocele).  Rectum (rectocele).  Intestines (enterocele).  When organs other than the vagina are involved, they often bulge into the vagina or protrude from the vagina, depending on how severe the prolapse is. What are the causes? Causes of this condition include:  Pregnancy, labor, and childbirth.  Long-lasting (chronic) cough.  Chronic constipation.  Obesity.  Past pelvic surgery.  Aging. During and after menopause, a decreased production of the hormone estrogen can weaken pelvic ligaments and muscles.  Consistently lifting more than 50 lb (23 kg).  Buildup of fluid in the abdomen due to certain diseases and other conditions.  What are the signs or symptoms? Symptoms of this condition include:  Loss of bladder control when you cough, sneeze, strain, and exercise (stress incontinence). This may be worse immediately following childbirth, and it may gradually improve over time.  Feeling pressure in your pelvis or vagina. This pressure may increase when you cough or when you are having a bowel movement.  A bulge that protrudes from the opening of your vagina or against your vaginal wall. If your uterus protrudes through the opening of your vagina and rubs against your clothing, you may also experience soreness, ulcers, infection, pain, and bleeding.  Increased effort to have a bowel movement or urinate.  Pain in your low back.  Pain, discomfort, or disinterest in sexual intercourse.  Repeated bladder infections (urinary tract infections).  Difficulty inserting or inability to insert a tampon or applicator.  In some people, this  condition does not cause any symptoms. How is this diagnosed? Your health care provider may perform an internal and external vaginal and rectal exam. During the exam, you may be asked to cough and strain while you are lying down, sitting, and standing up. Your health care provider will determine if other tests are required, such as bladder function tests. How is this treated? In most cases, this condition needs to be treated only if it produces symptoms. No treatment is guaranteed to correct the prolapse or relieve the symptoms completely. Treatment may include:  Lifestyle changes, such as: ? Avoiding drinking beverages that contain caffeine. ? Increasing your intake of high-fiber foods. This can help to decrease constipation and straining during bowel movements. ? Emptying your bladder at scheduled times (bladder training therapy). This can help to reduce or avoid urinary incontinence. ? Losing weight if you are overweight or obese.  Estrogen. Estrogen may help mild prolapse by increasing the strength and tone of pelvic floor muscles.  Kegel exercises. These may help mild cases of prolapse by strengthening and tightening the muscles of the pelvic floor.  Pessary insertion. A pessary is a soft, flexible device that is placed into your vagina by your health care provider to help support the vaginal walls and keep pelvic organs in place.  Surgery. This is often the only form of treatment for severe prolapse. Different types of surgeries are available.  Follow these instructions at home:  Wear a sanitary pad or absorbent product if you have urinary incontinence.  Avoid heavy lifting and straining with exercise and work. Do not hold your breath when you perform mild to moderate lifting and exercise activities. Limit your activities as directed by your health care  provider.  Take medicines only as directed by your health care provider.  Perform Kegel exercises as directed by your health care  provider.  If you have a pessary, take care of it as directed by your health care provider. Contact a health care provider if:  Your symptoms interfere with your daily activities or sex life.  You need medicine to help with the discomfort.  You notice bleeding from the vagina that is not related to your period.  You have a fever.  You have pain or bleeding when you urinate.  You have bleeding when you have a bowel movement.  You lose urine when you have sex.  You have chronic constipation.  You have a pessary that falls out.  You have vaginal discharge that has a bad smell.  You have low abdominal pain or cramping that is unusual for you. This information is not intended to replace advice given to you by your health care provider. Make sure you discuss any questions you have with your health care provider. Document Released: 06/08/2014 Document Revised: 04/18/2016 Document Reviewed: 01/24/2014 Elsevier Interactive Patient Education  2018 [employer].  Constipation, Adult Constipation is when a person:  Poops (has a bowel movement) fewer times in a week than normal.  Has a hard time pooping.  Has poop that is dry, hard, or bigger than normal.  Follow these instructions at home: Eating and drinking   Eat foods that have a lot of fiber, such as: ? Fresh fruits and vegetables. ? Whole grains. ? Beans.  Eat less of foods that are high in fat, low in fiber, or overly processed, such as: ? [place] fries. ? Hamburgers. ? Cookies. ? Candy. ? Soda.  Drink enough fluid to keep your pee (urine) clear or pale yellow. General instructions  Exercise regularly or as told by your doctor.  Go to the restroom when you feel like you need to poop. Do not hold it in.  Take over-the-counter and prescription medicines only as told by your doctor. These include any fiber supplements.  Do pelvic floor retraining exercises, such as: ? Doing deep breathing while relaxing your  lower belly (abdomen). ? Relaxing your pelvic floor while pooping.  Watch your condition for any changes.  Keep all follow-up visits as told by your doctor. This is important. Contact a doctor if:  You have pain that gets worse.  You have a fever.  You have not pooped for 4 days.  You throw up (vomit).  You are not hungry.  You lose weight.  You are bleeding from the anus.  You have thin, pencil-like poop (stool). Get help right away if:  You have a fever, and your symptoms suddenly get worse.  You leak poop or have blood in your poop.  Your belly feels hard or bigger than normal (is bloated).  You have very bad belly pain.  You feel dizzy or you faint. This information is not intended to replace advice given to you by your health care provider. Make sure you discuss any questions you have with your health care provider. Document Released: 04/29/2008 Document Revised: 05/31/2016 Document Reviewed: 05/01/2016 Elsevier Interactive Patient Education  2018 [employer].

## 2018-05-22 NOTE — MAU Note (Signed)
Pt presents to MAU c/o pressure in her vagina, bottom, back and thighs.  Pt states she had a cerclage on 05/18/18 and since then she is still feeling the same pressure from before but now the pain has  increased. Pt states she feels she has been here multiple times and feels as if she is not being heard. Pt states she cant have a BM and sometimes has difficulty urinating because of the pressure and fear. Pt states she can sometimes visualize her cervix out of her vagina. Pt is just fearful. Pt states she is on her feet all day at work and the pressure worsens throughout the day.

## 2018-05-22 NOTE — Progress Notes (Signed)
Subjective:  Kim Johns is a 27 y.o. G3P1101 at [redacted]w[redacted]d being seen today for ongoing prenatal care.  She is currently monitored for the following issues for this high-risk pregnancy and has History of cervical incompetence in pregnancy, currently pregnant; History of incompetent cervix, currently pregnant in first trimester; PID (acute pelvic inflammatory disease); Infection associated with intrauterine device (IUD) ([place]); TOA (tubo-ovarian abscess); HSV-2 (herpes simplex virus 2) infection; Anxiety; Frequent urination; Supervision of high risk pregnancy, antepartum; and Asthma on their problem list.  Patient reports no complaints.  Contractions: Not present. Vag. Bleeding: None.  Movement: Absent. Denies leaking of fluid.   The following portions of the patient's history were reviewed and updated as appropriate: allergies, current medications, past family history, past medical history, past social history, past surgical history and problem list. Problem list updated.  Objective:   Vitals:   05/19/18 1438  BP: 123/75  Pulse: 66  Weight: 80.7 kg (178 lb)    Fetal Status: Fetal Heart Rate (bpm): 160   Movement: Absent     General:  Alert, oriented and cooperative. Patient is in no acute distress.  Skin: Skin is warm and dry. No rash noted.   Cardiovascular: Normal heart rate noted  Respiratory: Normal respiratory effort, no problems with respiration noted  Abdomen: Soft, gravid, appropriate for gestational age. Pain/Pressure: Absent     Pelvic: Vag. Bleeding: None     Cervical exam deferred        Extremities: Normal range of motion.  Edema: None  Mental Status: Normal mood and affect. Normal behavior. Normal judgment and thought content.   Urinalysis:      Assessment and Plan:  Pregnancy: G3P1101 at [redacted]w[redacted]d  1. Supervision of high risk pregnancy, antepartum Routine care. Anatomy US ordered. Genetic testing ordered.  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 FOB at appointment and will look into getting tested. Further genetic testing ordered.  - Genetic Screening  3. History of cervical incompetence in pregnancy, currently pregnant S/p cerclage on 05/18/18. Denies any bleeding. Application completed for 17-P; to start soon.   General obstetric precautions including but not limited to vaginal bleeding, contractions, leaking of fluid and fetal movement were reviewed in detail with the patient. Please refer to After Visit Summary for other counseling recommendations.  Return in about 1 month (around 06/16/2018) for ob visit.   Katheren Shams, DO

## 2018-05-25 ENCOUNTER — Other Ambulatory Visit: Payer: Self-pay

## 2018-05-25 ENCOUNTER — Telehealth: Payer: Self-pay | Admitting: General Practice

## 2018-05-25 NOTE — Telephone Encounter (Signed)
Patient called and left message on nurse voicemail line stating she was seen in MAU on Friday and needs an appt for a pessary. Patient also is wanting her gender results. Called patient & discussed appt scheduled for Friday. Discussed with patient her genetic results are not back yet as they can take a couple weeks. Recommended she follow the results on the [employer] website or check mychart. Patient verbalized understanding & had no questions.

## 2018-05-29 ENCOUNTER — Ambulatory Visit (INDEPENDENT_AMBULATORY_CARE_PROVIDER_SITE_OTHER): Payer: Medicaid Other | Admitting: Obstetrics & Gynecology

## 2018-05-29 VITALS — BP 118/60 | HR 72 | Wt 179.8 lb

## 2018-05-29 DIAGNOSIS — O09299 Supervision of pregnancy with other poor reproductive or obstetric history, unspecified trimester: Secondary | ICD-10-CM

## 2018-05-29 DIAGNOSIS — O09292 Supervision of pregnancy with other poor reproductive or obstetric history, second trimester: Secondary | ICD-10-CM

## 2018-05-29 DIAGNOSIS — O0990 Supervision of high risk pregnancy, unspecified, unspecified trimester: Secondary | ICD-10-CM

## 2018-05-29 DIAGNOSIS — O0992 Supervision of high risk pregnancy, unspecified, second trimester: Secondary | ICD-10-CM

## 2018-05-29 NOTE — Progress Notes (Signed)
   PRENATAL VISIT NOTE  Subjective:She was seen in MAU and cervical prolapse was identified  Kim Johns is a 27 y.o. G3P1101 at [redacted]w[redacted]d being seen today for ongoing prenatal care.  She is currently monitored for the following issues for this high-risk pregnancy and has History of cervical incompetence in pregnancy, currently pregnant; History of incompetent cervix, currently pregnant in first trimester; Infection associated with intrauterine device (IUD) ([place]); HSV-2 (herpes simplex virus 2) infection; Anxiety; Supervision of high risk pregnancy, antepartum; and Asthma on their problem list.  Patient reports sx of descensus have resolved.  Contractions: Not present. Vag. Bleeding: None.  Movement: Absent. Denies leaking of fluid.   The following portions of the patient's history were reviewed and updated as appropriate: allergies, current medications, past family history, past medical history, past social history, past surgical history and problem list. Problem list updated.  Objective:   Vitals:   05/29/18 1119  BP: 118/60  Pulse: 72  Weight: 179 lb 12.8 oz (81.6 kg)    Fetal Status: Fetal Heart Rate (bpm): 155   Movement: Absent     General:  Alert, oriented and cooperative. Patient is in no acute distress.  Skin: Skin is warm and dry. No rash noted.   Cardiovascular: Normal heart rate noted  Respiratory: Normal respiratory effort, no problems with respiration noted  Abdomen: Soft, gravid, appropriate for gestational age.  Pain/Pressure: Present     Pelvic: Cervical exam performed        Extremities: Normal range of motion.  Edema: None  Mental Status: Normal mood and affect. Normal behavior. Normal judgment and thought content.   Assessment and Plan:  Pregnancy: G3P1101 at [redacted]w[redacted]d  1. Supervision of high risk pregnancy, antepartum No descensus noted on speculum exam and cerclage is intact  2. History of cervical incompetence in pregnancy, currently pregnant S/p  cerclage  Preterm labor symptoms and general obstetric precautions including but not limited to vaginal bleeding, contractions, leaking of fluid and fetal movement were reviewed in detail with the patient. Please refer to After Visit Summary for other counseling recommendations.  Return in 3 weeks (on 06/22/2018).  Future Appointments  Date Time Provider Department Center  06/22/2018  8:55 AM Donnamae Jude, MD [facility]  07/07/2018 10:30 AM [facility] [place] 5 [facility]    Emeterio Reeve, MD

## 2018-05-29 NOTE — Patient Instructions (Signed)

## 2018-06-03 ENCOUNTER — Encounter: Payer: Self-pay | Admitting: *Deleted

## 2018-06-15 ENCOUNTER — Telehealth: Payer: Self-pay | Admitting: General Practice

## 2018-06-15 ENCOUNTER — Ambulatory Visit (INDEPENDENT_AMBULATORY_CARE_PROVIDER_SITE_OTHER): Payer: Medicaid Other | Admitting: *Deleted

## 2018-06-15 VITALS — BP 124/58 | HR 69 | Ht 63.0 in | Wt 184.5 lb

## 2018-06-15 DIAGNOSIS — B009 Herpesviral infection, unspecified: Secondary | ICD-10-CM

## 2018-06-15 DIAGNOSIS — O09212 Supervision of pregnancy with history of pre-term labor, second trimester: Secondary | ICD-10-CM

## 2018-06-15 DIAGNOSIS — O09299 Supervision of pregnancy with other poor reproductive or obstetric history, unspecified trimester: Secondary | ICD-10-CM

## 2018-06-15 MED ORDER — HYDROXYPROGESTERONE CAPROATE 275 MG/1.1ML ~~LOC~~ SOAJ
275.0000 mg | Freq: Once | SUBCUTANEOUS | Status: AC
  Administered 2018-06-15: 275 mg via SUBCUTANEOUS

## 2018-06-15 MED ORDER — VALACYCLOVIR HCL 1 G PO TABS: 1000.0000 mg | ORAL_TABLET | Freq: Every day | ORAL | 4 refills | Status: DC

## 2018-06-15 NOTE — Telephone Encounter (Signed)
Patient called and left message on nurse voicemail line wanting to know when she was going to start her 17p. Called patient & informed her 17p was in office and she could come by tonight during walk in hours if she wanted to start injections. Patient verbalized understanding & states she will come tonight. Patient states she just left another message regarding she is out of valtrex Rx. Patient states she uses it as needed for outbreaks. Per Dr Roselie Awkward, may send Rx in for patient. Patient informed. Patient verbalized understanding & had no questions.

## 2018-06-15 NOTE — Progress Notes (Signed)
Pt requested to hear FHR - 150 bpm per doppler.  Makena 275 mg administered as scheduled.  Pt tolerated well.

## 2018-06-22 ENCOUNTER — Ambulatory Visit (INDEPENDENT_AMBULATORY_CARE_PROVIDER_SITE_OTHER): Payer: Medicaid Other | Admitting: Obstetrics & Gynecology

## 2018-06-22 VITALS — BP 125/70 | HR 90 | Wt 183.4 lb

## 2018-06-22 DIAGNOSIS — O0990 Supervision of high risk pregnancy, unspecified, unspecified trimester: Secondary | ICD-10-CM

## 2018-06-22 DIAGNOSIS — O09212 Supervision of pregnancy with history of pre-term labor, second trimester: Secondary | ICD-10-CM

## 2018-06-22 DIAGNOSIS — O09291 Supervision of pregnancy with other poor reproductive or obstetric history, first trimester: Secondary | ICD-10-CM

## 2018-06-22 DIAGNOSIS — O09299 Supervision of pregnancy with other poor reproductive or obstetric history, unspecified trimester: Secondary | ICD-10-CM

## 2018-06-22 MED ORDER — HYDROXYPROGESTERONE CAPROATE 275 MG/1.1ML ~~LOC~~ SOAJ
275.0000 mg | SUBCUTANEOUS | Status: AC
  Administered 2018-06-22 – 2018-09-08 (×10): 275 mg via SUBCUTANEOUS

## 2018-06-22 NOTE — Progress Notes (Signed)
   PRENATAL VISIT NOTE  Subjective:  Kim Johns is a 27 y.o. G3P1101 at [redacted]w[redacted]d being seen today for ongoing prenatal care.  She is currently monitored for the following issues for this high-risk pregnancy and has History of cervical incompetence in pregnancy, currently pregnant; History of incompetent cervix, currently pregnant in first trimester; Infection associated with intrauterine device (IUD) ([place]); HSV-2 (herpes simplex virus 2) infection; Anxiety; Supervision of high risk pregnancy, antepartum; and Asthma on their problem list.  Patient reports no complaints.  Contractions: Not present. Vag. Bleeding: None.  Movement: Present. Denies leaking of fluid.   The following portions of the patient's history were reviewed and updated as appropriate: allergies, current medications, past family history, past medical history, past social history, past surgical history and problem list. Problem list updated.  Objective:   Vitals:   06/22/18 0844  BP: 125/70  Pulse: 90  Weight: 183 lb 6.4 oz (83.2 kg)    Fetal Status: Fetal Heart Rate (bpm): 162   Movement: Present     General:  Alert, oriented and cooperative. Patient is in no acute distress.  Skin: Skin is warm and dry. No rash noted.   Cardiovascular: Normal heart rate noted  Respiratory: Normal respiratory effort, no problems with respiration noted  Abdomen: Soft, gravid, appropriate for gestational age.  Pain/Pressure: Present     Pelvic: Cervical exam performed  Closed and long        Extremities: Normal range of motion.  Edema: None  Mental Status: Normal mood and affect. Normal behavior. Normal judgment and thought content.   Assessment and Plan:  Pregnancy: G3P1101 at [redacted]w[redacted]d  1. Supervision of high risk pregnancy, antepartum -Cervix long and closed. -Anatomy US scheduled and look at cervical length then -weekly 17 P -AFP only today  2.  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  Pt understands now and would like to go to genetics  (no show for first appt)   Preterm labor symptoms and general obstetric precautions including but not limited to vaginal bleeding, contractions, leaking of fluid and fetal movement were reviewed in detail with the patient. Please refer to After Visit Summary for other counseling recommendations.  Return in about 3 weeks (around 07/13/2018).  Future Appointments  Date Time Provider [place]  07/07/2018 10:30 AM [facility] [place] 5 [facility]    Silas [place], MD

## 2018-06-24 LAB — AFP, SERUM, OPEN SPINA BIFIDA
AFP MoM: 0.98
AFP Value: 35.5 ng/mL
GEST. AGE ON COLLECTION DATE: 17.1 wk
Maternal Age At EDD: 28.2 yr
OSBR Risk 1 IN: 10000
Test Results:: NEGATIVE
WEIGHT: 183 [lb_av]

## 2018-06-29 ENCOUNTER — Ambulatory Visit (INDEPENDENT_AMBULATORY_CARE_PROVIDER_SITE_OTHER): Payer: Medicaid Other | Admitting: General Practice

## 2018-06-29 ENCOUNTER — Ambulatory Visit: Payer: Medicaid Other

## 2018-06-29 VITALS — BP 119/67 | HR 93 | Ht 62.0 in | Wt 185.0 lb

## 2018-06-29 DIAGNOSIS — O09292 Supervision of pregnancy with other poor reproductive or obstetric history, second trimester: Secondary | ICD-10-CM | POA: Diagnosis not present

## 2018-06-29 DIAGNOSIS — O09299 Supervision of pregnancy with other poor reproductive or obstetric history, unspecified trimester: Secondary | ICD-10-CM

## 2018-06-29 NOTE — Progress Notes (Signed)
Kim Johns here for 17-P  Injection.  Injection administered without complication. Patient will return in one week for next injection.  Derinda Late, RN 06/29/2018  3:09 PM

## 2018-06-29 NOTE — Progress Notes (Signed)
I have reviewed this chart and agree with the RN/CMA assessment and management.    Feliz Beam, M.D. [facility]

## 2018-06-30 ENCOUNTER — Encounter (HOSPITAL_COMMUNITY): Payer: Self-pay

## 2018-07-01 ENCOUNTER — Telehealth: Payer: Self-pay

## 2018-07-01 DIAGNOSIS — O0990 Supervision of high risk pregnancy, unspecified, unspecified trimester: Secondary | ICD-10-CM

## 2018-07-01 NOTE — Telephone Encounter (Signed)
Genetics appt scheduled for 07/13/18 @ 1000.  Pt notified.

## 2018-07-06 ENCOUNTER — Ambulatory Visit: Payer: Medicaid Other

## 2018-07-07 ENCOUNTER — Ambulatory Visit (INDEPENDENT_AMBULATORY_CARE_PROVIDER_SITE_OTHER): Payer: Medicaid Other

## 2018-07-07 ENCOUNTER — Other Ambulatory Visit: Payer: Self-pay | Admitting: Obstetrics and Gynecology

## 2018-07-07 ENCOUNTER — Ambulatory Visit (HOSPITAL_COMMUNITY)
Admission: RE | Admit: 2018-07-07 | Discharge: 2018-07-07 | Disposition: A | Discharge status: Home / Self Care | Payer: Medicaid Other | Source: Ambulatory Visit | Attending: Obstetrics and Gynecology | Admitting: Obstetrics and Gynecology

## 2018-07-07 ENCOUNTER — Other Ambulatory Visit (HOSPITAL_COMMUNITY): Payer: Self-pay | Admitting: *Deleted

## 2018-07-07 ENCOUNTER — Encounter (HOSPITAL_COMMUNITY): Payer: Self-pay

## 2018-07-07 ENCOUNTER — Other Ambulatory Visit (HOSPITAL_COMMUNITY): Payer: Self-pay

## 2018-07-07 VITALS — BP 107/68 | HR 69

## 2018-07-07 DIAGNOSIS — O283 Abnormal ultrasonic finding on antenatal screening of mother: Secondary | ICD-10-CM | POA: Diagnosis not present

## 2018-07-07 DIAGNOSIS — O09291 Supervision of pregnancy with other poor reproductive or obstetric history, first trimester: Secondary | ICD-10-CM

## 2018-07-07 DIAGNOSIS — Z3A19 19 weeks gestation of pregnancy: Secondary | ICD-10-CM | POA: Diagnosis not present

## 2018-07-07 DIAGNOSIS — O09292 Supervision of pregnancy with other poor reproductive or obstetric history, second trimester: Secondary | ICD-10-CM

## 2018-07-07 DIAGNOSIS — O0990 Supervision of high risk pregnancy, unspecified, unspecified trimester: Secondary | ICD-10-CM | POA: Diagnosis present

## 2018-07-07 DIAGNOSIS — Z362 Encounter for other antenatal screening follow-up: Secondary | ICD-10-CM

## 2018-07-07 DIAGNOSIS — O358XX0 Maternal care for other (suspected) fetal abnormality and damage, not applicable or unspecified: Secondary | ICD-10-CM

## 2018-07-07 DIAGNOSIS — Z141 Cystic fibrosis carrier: Secondary | ICD-10-CM

## 2018-07-07 DIAGNOSIS — Z363 Encounter for antenatal screening for malformations: Secondary | ICD-10-CM

## 2018-07-07 DIAGNOSIS — O3432 Maternal care for cervical incompetence, second trimester: Secondary | ICD-10-CM

## 2018-07-07 DIAGNOSIS — O09892 Supervision of other high risk pregnancies, second trimester: Secondary | ICD-10-CM | POA: Diagnosis not present

## 2018-07-07 DIAGNOSIS — O09299 Supervision of pregnancy with other poor reproductive or obstetric history, unspecified trimester: Secondary | ICD-10-CM

## 2018-07-07 DIAGNOSIS — O35EXX0 Maternal care for other (suspected) fetal abnormality and damage, fetal genitourinary anomalies, not applicable or unspecified: Secondary | ICD-10-CM

## 2018-07-07 NOTE — Progress Notes (Signed)
Kim Johns here for 17-P  Injection.  Injection administered without complication. Patient will return in one week for next injection.  Makena refilled from compound pharmacy expected to arrive within the week per [facility].    [name], RN 07/07/2018  1:43 PM

## 2018-07-07 NOTE — Progress Notes (Signed)
Chart reviewed for nurse visit. Agree with plan of care.   Wende Mott, [place] 07/07/2018 2:08 PM

## 2018-07-13 ENCOUNTER — Ambulatory Visit (HOSPITAL_COMMUNITY)
Admission: RE | Admit: 2018-07-13 | Discharge: 2018-07-13 | Disposition: A | Discharge status: Home / Self Care | Payer: Medicaid Other | Source: Ambulatory Visit | Attending: Obstetrics and Gynecology | Admitting: Obstetrics and Gynecology

## 2018-07-13 ENCOUNTER — Other Ambulatory Visit (HOSPITAL_COMMUNITY): Payer: Self-pay

## 2018-07-13 ENCOUNTER — Ambulatory Visit (HOSPITAL_COMMUNITY): Payer: Self-pay

## 2018-07-13 ENCOUNTER — Ambulatory Visit (INDEPENDENT_AMBULATORY_CARE_PROVIDER_SITE_OTHER): Payer: Medicaid Other | Admitting: Family Medicine

## 2018-07-13 VITALS — BP 127/80 | HR 76 | Wt 187.6 lb

## 2018-07-13 DIAGNOSIS — O0990 Supervision of high risk pregnancy, unspecified, unspecified trimester: Secondary | ICD-10-CM | POA: Insufficient documentation

## 2018-07-13 DIAGNOSIS — O09899 Supervision of other high risk pregnancies, unspecified trimester: Secondary | ICD-10-CM

## 2018-07-13 DIAGNOSIS — Z141 Cystic fibrosis carrier: Secondary | ICD-10-CM

## 2018-07-13 DIAGNOSIS — O09892 Supervision of other high risk pregnancies, second trimester: Secondary | ICD-10-CM | POA: Diagnosis not present

## 2018-07-13 DIAGNOSIS — O26892 Other specified pregnancy related conditions, second trimester: Secondary | ICD-10-CM

## 2018-07-13 DIAGNOSIS — O09292 Supervision of pregnancy with other poor reproductive or obstetric history, second trimester: Secondary | ICD-10-CM

## 2018-07-13 DIAGNOSIS — R519 Headache, unspecified: Secondary | ICD-10-CM

## 2018-07-13 DIAGNOSIS — O09212 Supervision of pregnancy with history of pre-term labor, second trimester: Secondary | ICD-10-CM

## 2018-07-13 DIAGNOSIS — R51 Headache: Secondary | ICD-10-CM | POA: Diagnosis not present

## 2018-07-13 DIAGNOSIS — O09299 Supervision of pregnancy with other poor reproductive or obstetric history, unspecified trimester: Secondary | ICD-10-CM

## 2018-07-13 MED ORDER — CYCLOBENZAPRINE HCL 10 MG PO TABS: 10.0000 mg | ORAL_TABLET | Freq: Three times a day (TID) | ORAL | 1 refills | Status: DC | PRN

## 2018-07-13 NOTE — Progress Notes (Signed)
   PRENATAL VISIT NOTE  Subjective:  Kim Johns is a 27 y.o. G3P1101 at [redacted]w[redacted]d being seen today for ongoing prenatal care.  She is currently monitored for the following issues for this high-risk pregnancy and has History of cervical incompetence in pregnancy, currently pregnant; Infection associated with intrauterine device (IUD) ([place]); HSV-2 (herpes simplex virus 2) infection; Anxiety; Cystic fibrosis carrier, antepartum; and Asthma on their problem list.  Patient reports contractions since last week, headache and left wrist pain.  Contractions: Not present. Vag. Bleeding: None.  Movement: Present. Denies leaking of fluid.   The following portions of the patient's history were reviewed and updated as appropriate: allergies, current medications, past family history, past medical history, past social history, past surgical history and problem list. Problem list updated.  Objective:   Vitals:   07/13/18 0831  BP: 127/80  Pulse: 76  Weight: 187 lb 9.6 oz (85.1 kg)    Fetal Status: Fetal Heart Rate (bpm): 155   Movement: Present     General:  Alert, oriented and cooperative. Patient is in no acute distress.  Skin: Skin is warm and dry. No rash noted.   Cardiovascular: Normal heart rate noted  Respiratory: Normal respiratory effort, no problems with respiration noted  Abdomen: Soft, gravid, appropriate for gestational age.  Pain/Pressure: Present     Pelvic: Cervical exam deferred        Extremities: Normal range of motion.  Edema: None  Mental Status: Normal mood and affect. Normal behavior. Normal judgment and thought content.   Assessment and Plan:  Pregnancy: G3P1101 at [redacted]w[redacted]d  1. History of cervical incompetence in pregnancy, currently pregnant S/p cerclage Continue 17P  2. Cystic fibrosis carrier, antepartum S/p amniocentesis awaiting results  3. Pregnancy headache in second trimester Trial of Ibuprofen no more than 3 days in a row and not after 32 wks - cyclobenzaprine  (FLEXERIL) 10 MG tablet; Take 1 tablet (10 mg total) by mouth every 8 (eight) hours as needed for muscle spasms.  Dispense: 30 tablet; Refill: 1  Preterm labor symptoms and general obstetric precautions including but not limited to vaginal bleeding, contractions, leaking of fluid and fetal movement were reviewed in detail with the patient. Please refer to After Visit Summary for other counseling recommendations.  Return in 3 weeks (on 08/03/2018) for 17 P weekly.  Future Appointments  Date Time Provider [place]  07/13/2018 10:00 AM [place] [facility] [place] [facility]  08/04/2018  9:30 AM [place] 1 [facility]    Donnamae Jude, MD

## 2018-07-13 NOTE — Patient Instructions (Addendum)
Breastfeeding Choosing to breastfeed is one of the best decisions you can make for yourself and your baby. A change in hormones during pregnancy causes your breasts to make breast milk in your milk-producing glands. Hormones prevent breast milk from being released before your baby is born. They also prompt milk flow after birth. Once breastfeeding has begun, thoughts of your baby, as well as his or her sucking or crying, can stimulate the release of milk from your milk-producing glands. Benefits of breastfeeding Research shows that breastfeeding offers many health benefits for infants and mothers. It also offers a cost-free and convenient way to feed your baby. For your baby  Your first milk (colostrum) helps your baby's digestive system to function better.  Special cells in your milk (antibodies) help your baby to fight off infections.  Breastfed babies are less likely to develop asthma, allergies, obesity, or type 2 diabetes. They are also at lower risk for sudden infant death syndrome (SIDS).  Nutrients in breast milk are better able to meet your baby's needs compared to infant formula.  Breast milk improves your baby's brain development. For you  Breastfeeding helps to create a very special bond between you and your baby.  Breastfeeding is convenient. Breast milk costs nothing and is always available at the correct temperature.  Breastfeeding helps to burn calories. It helps you to lose the weight that you gained during pregnancy.  Breastfeeding makes your uterus return faster to its size before pregnancy. It also slows bleeding (lochia) after you give birth.  Breastfeeding helps to lower your risk of developing type 2 diabetes, osteoporosis, rheumatoid arthritis, cardiovascular disease, and breast, ovarian, uterine, and endometrial cancer later in life. Breastfeeding basics Starting breastfeeding  Find a comfortable place to sit or lie down, with your neck and back  well-supported.  Place a pillow or a rolled-up blanket under your baby to bring him or her to the level of your breast (if you are seated). Nursing pillows are specially designed to help support your arms and your baby while you breastfeed.  Make sure that your baby's tummy (abdomen) is facing your abdomen.  Gently massage your breast. With your fingertips, massage from the outer edges of your breast inward toward the nipple. This encourages milk flow. If your milk flows slowly, you may need to continue this action during the feeding.  Support your breast with 4 fingers underneath and your thumb above your nipple (make the letter "C" with your hand). Make sure your fingers are well away from your nipple and your baby's mouth.  Stroke your baby's lips gently with your finger or nipple.  When your baby's mouth is open wide enough, quickly bring your baby to your breast, placing your entire nipple and as much of the areola as possible into your baby's mouth. The areola is the colored area around your nipple. ? More areola should be visible above your baby's upper lip than below the lower lip. ? Your baby's lips should be opened and extended outward (flanged) to ensure an adequate, comfortable latch. ? Your baby's tongue should be between his or her lower gum and your breast.  Make sure that your baby's mouth is correctly positioned around your nipple (latched). Your baby's lips should create a seal on your breast and be turned out (everted).  It is common for your baby to suck about 2-3 minutes in order to start the flow of breast milk. Latching Teaching your baby how to latch onto your breast properly is  very important. An improper latch can cause nipple pain, decreased milk supply, and poor weight gain in your baby. Also, if your baby is not latched onto your nipple properly, he or she may swallow some air during feeding. This can make your baby fussy. Burping your baby when you switch breasts  during the feeding can help to get rid of the air. However, teaching your baby to latch on properly is still the best way to prevent fussiness from swallowing air while breastfeeding. Signs that your baby has successfully latched onto your nipple  Silent tugging or silent sucking, without causing you pain. Infant's lips should be extended outward (flanged).  Swallowing heard between every 3-4 sucks once your milk has started to flow (after your let-down milk reflex occurs).  Muscle movement above and in front of his or her ears while sucking.  Signs that your baby has not successfully latched onto your nipple  Sucking sounds or smacking sounds from your baby while breastfeeding.  Nipple pain.  If you think your baby has not latched on correctly, slip your finger into the corner of your baby's mouth to break the suction and place it between your baby's gums. Attempt to start breastfeeding again. Signs of successful breastfeeding Signs from your baby  Your baby will gradually decrease the number of sucks or will completely stop sucking.  Your baby will fall asleep.  Your baby's body will relax.  Your baby will retain a small amount of milk in his or her mouth.  Your baby will let go of your breast by himself or herself.  Signs from you  Breasts that have increased in firmness, weight, and size 1-3 hours after feeding.  Breasts that are softer immediately after breastfeeding.  Increased milk volume, as well as a change in milk consistency and color by the fifth day of breastfeeding.  Nipples that are not sore, cracked, or bleeding.  Signs that your baby is getting enough milk  Wetting at least 1-2 diapers during the first 24 hours after birth.  Wetting at least 5-6 diapers every 24 hours for the first week after birth. The urine should be clear or pale yellow by the age of 5 days.  Wetting 6-8 diapers every 24 hours as your baby continues to grow and develop.  At least 3  stools in a 24-hour period by the age of 5 days. The stool should be soft and yellow.  At least 3 stools in a 24-hour period by the age of 7 days. The stool should be seedy and yellow.  No loss of weight greater than 10% of birth weight during the first 3 days of life.  Average weight gain of 4-7 oz (113-198 g) per week after the age of 4 days.  Consistent daily weight gain by the age of 5 days, without weight loss after the age of 2 weeks. After a feeding, your baby may spit up a small amount of milk. This is normal. Breastfeeding frequency and duration Frequent feeding will help you make more milk and can prevent sore nipples and extremely full breasts (breast engorgement). Breastfeed when you feel the need to reduce the fullness of your breasts or when your baby shows signs of hunger. This is called "breastfeeding on demand." Signs that your baby is hungry include:  Increased alertness, activity, or restlessness.  Movement of the head from side to side.  Opening of the mouth when the corner of the mouth or cheek is stroked (rooting).  Increased sucking  sounds, smacking lips, cooing, sighing, or squeaking.  Hand-to-mouth movements and sucking on fingers or hands.  Fussing or crying.  Avoid introducing a pacifier to your baby in the first 4-6 weeks after your baby is born. After this time, you may choose to use a pacifier. Research has shown that pacifier use during the first year of a baby's life decreases the risk of sudden infant death syndrome (SIDS). Allow your baby to feed on each breast as long as he or she wants. When your baby unlatches or falls asleep while feeding from the first breast, offer the second breast. Because newborns are often sleepy in the first few weeks of life, you may need to awaken your baby to get him or her to feed. Breastfeeding times will vary from baby to baby. However, the following rules can serve as a guide to help you make sure that your baby is  properly fed:  Newborns (babies 22 weeks of age or younger) may breastfeed every 1-3 hours.  Newborns should not go without breastfeeding for longer than 3 hours during the day or 5 hours during the night.  You should breastfeed your baby a minimum of 8 times in a 24-hour period.  Breast milk pumping Pumping and storing breast milk allows you to make sure that your baby is exclusively fed your breast milk, even at times when you are unable to breastfeed. This is especially important if you go back to work while you are still breastfeeding, or if you are not able to be present during feedings. Your lactation consultant can help you find a method of pumping that works best for you and give you guidelines about how long it is safe to store breast milk. Caring for your breasts while you breastfeed Nipples can become dry, cracked, and sore while breastfeeding. The following recommendations can help keep your breasts moisturized and healthy:  Avoid using soap on your nipples.  Wear a supportive bra designed especially for nursing. Avoid wearing underwire-style bras or extremely tight bras (sports bras).  Air-dry your nipples for 3-4 minutes after each feeding.  Use only cotton bra pads to absorb leaked breast milk. Leaking of breast milk between feedings is normal.  Use lanolin on your nipples after breastfeeding. Lanolin helps to maintain your skin's normal moisture barrier. Pure lanolin is not harmful (not toxic) to your baby. You may also hand express a few drops of breast milk and gently massage that milk into your nipples and allow the milk to air-dry.  In the first few weeks after giving birth, some women experience breast engorgement. Engorgement can make your breasts feel heavy, warm, and tender to the touch. Engorgement peaks within 3-5 days after you give birth. The following recommendations can help to ease engorgement:  Completely empty your breasts while breastfeeding or pumping. You  may want to start by applying warm, moist heat (in the shower or with warm, water-soaked hand towels) just before feeding or pumping. This increases circulation and helps the milk flow. If your baby does not completely empty your breasts while breastfeeding, pump any extra milk after he or she is finished.  Apply ice packs to your breasts immediately after breastfeeding or pumping, unless this is too uncomfortable for you. To do this: ? Put ice in a plastic bag. ? Place a towel between your skin and the bag. ? Leave the ice on for 20 minutes, 2-3 times a day.  Make sure that your baby is latched on and positioned properly  while breastfeeding.  If engorgement persists after 48 hours of following these recommendations, contact your health care provider or a Science writer. Overall health care recommendations while breastfeeding  Eat 3 healthy meals and 3 snacks every day. Well-nourished mothers who are breastfeeding need an additional 450-500 calories a day. You can meet this requirement by increasing the amount of a balanced diet that you eat.  Drink enough water to keep your urine pale yellow or clear.  Rest often, relax, and continue to take your prenatal vitamins to prevent fatigue, stress, and low vitamin and mineral levels in your body (nutrient deficiencies).  Do not use any products that contain nicotine or tobacco, such as cigarettes and e-cigarettes. Your baby may be harmed by chemicals from cigarettes that pass into breast milk and exposure to secondhand smoke. If you need help quitting, ask your health care provider.  Avoid alcohol.  Do not use illegal drugs or marijuana.  Talk with your health care provider before taking any medicines. These include over-the-counter and prescription medicines as well as vitamins and herbal supplements. Some medicines that may be harmful to your baby can pass through breast milk.  It is possible to become pregnant while breastfeeding. If  birth control is desired, ask your health care provider about options that will be safe while breastfeeding your baby. Where to find more information: [employer]: www.llli.org Contact a health care provider if:  You feel like you want to stop breastfeeding or have become frustrated with breastfeeding.  Your nipples are cracked or bleeding.  Your breasts are red, tender, or warm.  You have: ? Painful breasts or nipples. ? A swollen area on either breast. ? A fever or chills. ? Nausea or vomiting. ? Drainage other than breast milk from your nipples.  Your breasts do not become full before feedings by the fifth day after you give birth.  You feel sad and depressed.  Your baby is: ? Too sleepy to eat well. ? Having trouble sleeping. ? More than 92 week old and wetting fewer than 6 diapers in a 24-hour period. ? Not gaining weight by 57 days of age.  Your baby has fewer than 3 stools in a 24-hour period.  Your baby's skin or the white parts of his or her eyes become yellow. Get help right away if:  Your baby is overly tired (lethargic) and does not want to wake up and feed.  Your baby develops an unexplained fever. Summary  Breastfeeding offers many health benefits for infant and mothers.  Try to breastfeed your infant when he or she shows early signs of hunger.  Gently tickle or stroke your baby's lips with your finger or nipple to allow the baby to open his or her mouth. Bring the baby to your breast. Make sure that much of the areola is in your baby's mouth. Offer one side and burp the baby before you offer the other side.  Talk with your health care provider or lactation consultant if you have questions or you face problems as you breastfeed. This information is not intended to replace advice given to you by your health care provider. Make sure you discuss any questions you have with your health care provider. Document Released: 11/11/2005 Document  Revised: 12/13/2016 Document Reviewed: 12/13/2016 Elsevier Interactive Patient Education  2018 [place]? Guide for patients at [facility]  Why consider waterbirth?  . Gentle birth for babies . Less pain medicine used in labor .  May allow for passive descent/less pushing . May reduce perineal tears  . More mobility and instinctive maternal position changes . Increased maternal relaxation . Reduced blood pressure in labor  Is waterbirth safe? What are the risks of infection, drowning or other complications?  . Infection: o Very low risk (3.7 % for tub vs 4.8% for bed) o 7 in 8000 waterbirths with documented infection o Poorly cleaned equipment most common cause o Slightly lower group B strep transmission rate  . Drowning o Maternal:  - Very low risk   - Related to seizures or fainting o Newborn:  - Very low risk. No evidence of increased risk of respiratory problems in multiple large studies - Physiological protection from breathing under water - Avoid underwater birth if there are any fetal complications - Once baby's head is out of the water, keep it out.  . Birth complication o Some reports of cord trauma, but risk decreased by bringing baby to surface gradually o No evidence of increased risk of shoulder dystocia. Mothers can usually change positions faster in water than in a bed, possibly aiding the maneuvers to free the shoulder.   You must attend a Doren Custard class at [facility]  3rd Wednesday of every month from 7-9pm  Free  [employer] by calling [phone] or online at [email or web address]  Bring [place] the certificate from the class to your prenatal appointment  Meet with a midwife at 36 weeks to see if you can still plan a waterbirth and to sign the consent.   Purchase or rent the following supplies:   Water Birth Pool (Birth Pool in a Box or [place] for instance)  (Tubs start ~$125)  Single-use  disposable tub liner designed for your brand of tub  New garden hose labeled "lead-free", "suitable for drinking water",  Electric drain pump to remove water (We recommend 792 gallon per hour or greater pump.)   Separate garden hose to remove the dirty water  Fish net  Bathing suit top (optional)  Long-handled mirror (optional)  Places to purchase or rent supplies  [employer]Web[employer].is for tub purchases and supplies  Waterbirthsolutions.com for tub purchases and supplies  [employer] (www.thelaborladies.com) $275 for tub rental/set-up & take down/kit   [employer] (http://www.fleming.com/.htm) Information regarding doulas (labor support) who provide pool rentals  Our practice has a Birth Pool in a Box tub at the hospital that you may borrow on a first-come-first-served basis. It is your responsibility to to set up, clean and break down the tub. We cannot guarantee the availability of this tub in advance. You are responsible for bringing all accessories listed above. If you do not have all necessary supplies you cannot have a waterbirth.    Things that would prevent you from having a waterbirth:  Premature, <37wks  Previous cesarean birth  Presence of thick meconium-stained fluid  Multiple gestation (Twins, triplets, etc.)  Uncontrolled diabetes or gestational diabetes requiring medication  Hypertension requiring medication or diagnosis of pre-eclampsia  Heavy vaginal bleeding  Non-reassuring fetal heart rate  Active infection (MRSA, etc.). Group B Strep is NOT a contraindication for  waterbirth.  If your labor has to be induced and induction method requires continuous  monitoring of the baby's heart rate  Other risks/issues identified by your obstetrical provider  Please remember that birth is unpredictable. Under certain unforeseeable circumstances your provider may advise against giving birth in the tub. These decisions will be made on a  case-by-case basis and with the safety of you and  your baby as our highest priority.

## 2018-07-17 ENCOUNTER — Other Ambulatory Visit (HOSPITAL_COMMUNITY): Payer: Self-pay

## 2018-07-20 ENCOUNTER — Other Ambulatory Visit (HOSPITAL_COMMUNITY): Payer: Self-pay

## 2018-07-21 ENCOUNTER — Other Ambulatory Visit (HOSPITAL_COMMUNITY): Payer: Self-pay

## 2018-07-21 ENCOUNTER — Ambulatory Visit (INDEPENDENT_AMBULATORY_CARE_PROVIDER_SITE_OTHER): Payer: Medicaid Other

## 2018-07-21 VITALS — Wt 188.3 lb

## 2018-07-21 DIAGNOSIS — O09212 Supervision of pregnancy with history of pre-term labor, second trimester: Secondary | ICD-10-CM

## 2018-07-21 DIAGNOSIS — O09899 Supervision of other high risk pregnancies, unspecified trimester: Secondary | ICD-10-CM

## 2018-07-21 DIAGNOSIS — O09219 Supervision of pregnancy with history of pre-term labor, unspecified trimester: Principal | ICD-10-CM

## 2018-07-21 NOTE — Progress Notes (Signed)
Patient seen and assessed by nursing staff.  Agree with documentation and plan.  

## 2018-07-21 NOTE — Progress Notes (Signed)
Kim Johns here for 17-P  Injection.  Injection administered without complication. Patient will return in one week for next injection.  Bethanne Ginger, [place] 07/21/2018  10:17 AM

## 2018-07-24 ENCOUNTER — Telehealth (HOSPITAL_COMMUNITY): Payer: Self-pay | Admitting: *Deleted

## 2018-07-24 NOTE — Telephone Encounter (Signed)
Name and DOB verified.  Called pt with results.  Fragile x negative, amnio for CF was negative and normal female karyotype 33, XY given to patient.  Pt voiced understanding.

## 2018-07-27 ENCOUNTER — Inpatient Hospital Stay (HOSPITAL_COMMUNITY)
Admission: AD | Admit: 2018-07-27 | Discharge: 2018-07-28 | Disposition: A | Discharge status: Home / Self Care | Payer: Medicaid Other | Source: Ambulatory Visit | Attending: Obstetrics and Gynecology | Admitting: Obstetrics and Gynecology

## 2018-07-27 DIAGNOSIS — Z833 Family history of diabetes mellitus: Secondary | ICD-10-CM | POA: Insufficient documentation

## 2018-07-27 DIAGNOSIS — Z87891 Personal history of nicotine dependence: Secondary | ICD-10-CM | POA: Diagnosis not present

## 2018-07-27 DIAGNOSIS — Z91013 Allergy to seafood: Secondary | ICD-10-CM | POA: Diagnosis not present

## 2018-07-27 DIAGNOSIS — Z8249 Family history of ischemic heart disease and other diseases of the circulatory system: Secondary | ICD-10-CM | POA: Insufficient documentation

## 2018-07-27 DIAGNOSIS — Z882 Allergy status to sulfonamides status: Secondary | ICD-10-CM | POA: Insufficient documentation

## 2018-07-27 DIAGNOSIS — Z3A22 22 weeks gestation of pregnancy: Secondary | ICD-10-CM | POA: Diagnosis not present

## 2018-07-27 DIAGNOSIS — R102 Pelvic and perineal pain: Secondary | ICD-10-CM | POA: Diagnosis not present

## 2018-07-27 DIAGNOSIS — O3432 Maternal care for cervical incompetence, second trimester: Secondary | ICD-10-CM | POA: Diagnosis not present

## 2018-07-27 DIAGNOSIS — R1030 Lower abdominal pain, unspecified: Secondary | ICD-10-CM | POA: Diagnosis present

## 2018-07-27 DIAGNOSIS — O26892 Other specified pregnancy related conditions, second trimester: Secondary | ICD-10-CM | POA: Diagnosis not present

## 2018-07-27 DIAGNOSIS — O26899 Other specified pregnancy related conditions, unspecified trimester: Secondary | ICD-10-CM

## 2018-07-27 LAB — URINALYSIS, ROUTINE W REFLEX MICROSCOPIC
Bacteria, UA: NONE SEEN
Bilirubin Urine: NEGATIVE
GLUCOSE, UA: NEGATIVE mg/dL
KETONES UR: NEGATIVE mg/dL
LEUKOCYTES UA: NEGATIVE
Nitrite: NEGATIVE
PH: 7 (ref 5.0–8.0)
Protein, ur: NEGATIVE mg/dL
SPECIFIC GRAVITY, URINE: 1.004 — AB (ref 1.005–1.030)

## 2018-07-27 NOTE — MAU Note (Addendum)
Pt here with c/o abdominal and back pain and pain when urinating. Denies any bleeding or leaking of fluid. Reports good fetal movement. Pt has cerclage in place since 12 weeks.

## 2018-07-28 ENCOUNTER — Ambulatory Visit (INDEPENDENT_AMBULATORY_CARE_PROVIDER_SITE_OTHER): Payer: Medicaid Other | Admitting: *Deleted

## 2018-07-28 ENCOUNTER — Encounter (HOSPITAL_COMMUNITY): Payer: Self-pay | Admitting: *Deleted

## 2018-07-28 ENCOUNTER — Inpatient Hospital Stay (HOSPITAL_BASED_OUTPATIENT_CLINIC_OR_DEPARTMENT_OTHER): Payer: Medicaid Other

## 2018-07-28 ENCOUNTER — Encounter: Payer: Self-pay | Admitting: *Deleted

## 2018-07-28 VITALS — BP 113/73 | HR 63 | Wt 186.6 lb

## 2018-07-28 DIAGNOSIS — O09212 Supervision of pregnancy with history of pre-term labor, second trimester: Secondary | ICD-10-CM | POA: Diagnosis not present

## 2018-07-28 DIAGNOSIS — Z3A22 22 weeks gestation of pregnancy: Secondary | ICD-10-CM | POA: Diagnosis not present

## 2018-07-28 DIAGNOSIS — O26892 Other specified pregnancy related conditions, second trimester: Secondary | ICD-10-CM

## 2018-07-28 DIAGNOSIS — O3432 Maternal care for cervical incompetence, second trimester: Secondary | ICD-10-CM

## 2018-07-28 DIAGNOSIS — O283 Abnormal ultrasonic finding on antenatal screening of mother: Secondary | ICD-10-CM

## 2018-07-28 DIAGNOSIS — Z8751 Personal history of pre-term labor: Secondary | ICD-10-CM

## 2018-07-28 DIAGNOSIS — R102 Pelvic and perineal pain: Secondary | ICD-10-CM

## 2018-07-28 MED ORDER — ACETAMINOPHEN 500 MG PO TABS
1000.0000 mg | ORAL_TABLET | Freq: Once | ORAL | Status: AC
  Administered 2018-07-28: 1000 mg via ORAL
  Filled 2018-07-28: qty 2

## 2018-07-28 NOTE — Progress Notes (Signed)
Attestation of Attending Supervision of Advanced Practitioner (PA/CNM/NP): Evaluation and management procedures were performed by the Advanced Practitioner under my supervision and collaboration.  I have reviewed the Advanced Practitioner's note and chart, and I agree with the management and plan.  Feliz Beam, M.D. [facility]  07/28/2018 3:46 PM

## 2018-07-28 NOTE — Discharge Instructions (Signed)

## 2018-07-28 NOTE — MAU Provider Note (Signed)
History     CSN: [id number]  Arrival date and time: 07/27/18 2331   First Provider Initiated Contact with Patient 07/28/18 0010      Chief Complaint  Patient presents with  . Abdominal Pain  . Back Pain   HPI Kim Johns is a 27 y.o. G3P1101 at [redacted]w[redacted]d who presents with lower abdominal and back pain. She states it sharp and shooting in nature. She also reports pressure with urination. She rates the pain a 5/10 and tried tylenol with no relief. She states flexeril usually works for the pain but didn't try it because she was worried she may be dilating. She denies any leaking or bleeding. Reports normal fetal movement.   This pregnancy is complicated by cerclage placement in June due to an incompetent cervix.   OB History    Gravida  3   Para  2   Term  1   Preterm  1   AB  0   Living  1     SAB  0   TAB  0   Ectopic  0   Multiple  0   Live Births  1           Past Medical History:  Diagnosis Date  . Abscess of breast   . Allergy   . Anxiety   . Asthma    as a child - no inhaler  . Bipolar 1 disorder ([place])   . Depression   . GERD (gastroesophageal reflux disease)    diet control  . HSV-2 (herpes simplex virus 2) infection   . Pelvic inflammatory disease   . Preterm labor   . SVD (spontaneous vaginal delivery)    x 2 - fetal demise at 21 weeks, 1 living    Past Surgical History:  Procedure Laterality Date  . CERVICAL CERCLAGE N/A 04/05/2015   Procedure: CERCLAGE CERVICAL;  Surgeon: Lavonia Drafts, MD;  Location: [facility];  Service: Gynecology;  Laterality: N/A;  . CERVICAL CERCLAGE N/A 05/18/2018   Procedure: CERCLAGE CERVICAL;  Surgeon: Mora Bellman, MD;  Location: [facility];  Service: Gynecology;  Laterality: N/A;  . WISDOM TOOTH EXTRACTION      Family History  Problem Relation Age of Onset  . Diabetes Mother   . Stroke Mother   . Hypertension Father   . Heart attack Father   . Heart disease Father     Social History   Tobacco  Use  . Smoking status: Former Smoker    Packs/day: 0.50    Years: 10.00    Pack years: 5.00    Types: Cigarettes    Last attempt to quit: 03/09/2018    Years since quitting: 0.3  . Smokeless tobacco: Never Used  Substance Use Topics  . Alcohol use: Not Currently    Alcohol/week: 0.0 standard drinks    Comment: occ but none with pregnancy  . Drug use: No    Allergies:  Allergies  Allergen Reactions  . Shellfish Allergy Hives  . Sulfa Antibiotics Other (See Comments)    Reaction:  Unknown; childhood reaction  . Icy Hot Rash    Facility-Administered Medications Prior to Admission  Medication Dose Route Frequency Provider Last Rate Last Dose  . HYDROXYprogesterone Caproate SOAJ 275 mg  275 mg Subcutaneous Weekly Guss Bunde, MD   275 mg at 07/21/18 1018   Medications Prior to Admission  Medication Sig Dispense Refill Last Dose  . acetaminophen (TYLENOL) 500 MG tablet Take 500 mg by mouth every 6 (six)  hours as needed.   07/27/2018 at Unknown time  . cyclobenzaprine (FLEXERIL) 10 MG tablet Take 1 tablet (10 mg total) by mouth every 8 (eight) hours as needed for muscle spasms. 30 tablet 1 Past Week at Unknown time  . Prenatal Vit-Fe Fumarate-FA (PRENATAL MULTIVITAMIN) TABS tablet Take 1 tablet by mouth daily at 12 noon.   07/27/2018 at Unknown time  . docusate sodium (COLACE) 100 MG capsule Take 1 capsule (100 mg total) by mouth 2 (two) times daily as needed. (Patient not taking: Reported on 07/21/2018) 30 capsule 2 Not Taking  . hydroxyprogesterone caproate (MAKENA) 250 mg/mL OIL injection Inject 1 mL (250 mg total) into the muscle once for 1 dose. 1 mL 0   . polyethylene glycol (MIRALAX / GLYCOLAX) packet Take 17 g by mouth daily. (Patient not taking: Reported on 06/22/2018) 14 each 0 Not Taking    Review of Systems  Constitutional: Negative.  Negative for fatigue and fever.  HENT: Negative.   Respiratory: Negative.  Negative for shortness of breath.   Cardiovascular: Negative.   Negative for chest pain.  Gastrointestinal: Positive for abdominal pain. Negative for constipation, diarrhea, nausea and vomiting.  Genitourinary: Negative.  Negative for dysuria, vaginal bleeding and vaginal discharge.  Musculoskeletal: Positive for back pain.  Neurological: Negative.  Negative for dizziness and headaches.   Physical Exam   Blood pressure 105/62, pulse 67, temperature 98.4 F (36.9 C), temperature source Oral, resp. rate 18, height 5\' 2"  (1.575 m), weight 86.2 kg, last menstrual period 02/22/2018, SpO2 100 %.  Physical Exam  Nursing note and vitals reviewed. Constitutional: She is oriented to person, place, and time. She appears well-developed and well-nourished. No distress.  HENT:  Head: Normocephalic.  Eyes: Pupils are equal, round, and reactive to light.  Cardiovascular: Normal rate, regular rhythm and normal heart sounds.  Respiratory: Effort normal and breath sounds normal. No respiratory distress.  GI: Soft. Bowel sounds are normal. She exhibits no distension. There is no tenderness.  Genitourinary:  Genitourinary Comments: Cerclage sutures palpated  Neurological: She is alert and oriented to person, place, and time.  Skin: Skin is warm and dry.  Psychiatric: She has a normal mood and affect. Her behavior is normal. Judgment and thought content normal.   Dilation: Closed Exam by:: Sharolyn Douglas CNM   FHT: 145 bpm  MAU Course  Procedures Results for orders placed or performed during the hospital encounter of 07/27/18 (from the past 24 hour(s))  Urinalysis, Routine w reflex microscopic     Status: Abnormal   Collection Time: 07/27/18 11:46 PM  Result Value Ref Range   Color, Urine STRAW (A) YELLOW   APPearance CLEAR CLEAR   Specific Gravity, Urine 1.004 (L) 1.005 - 1.030   pH 7.0 5.0 - 8.0   Glucose, UA NEGATIVE NEGATIVE mg/dL   Hgb urine dipstick SMALL (A) NEGATIVE   Bilirubin Urine NEGATIVE NEGATIVE   Ketones, ur NEGATIVE NEGATIVE mg/dL   Protein,  ur NEGATIVE NEGATIVE mg/dL   Nitrite NEGATIVE NEGATIVE   Leukocytes, UA NEGATIVE NEGATIVE   RBC / HPF 0-5 0 - 5 RBC/hpf   Bacteria, UA NONE SEEN NONE SEEN   Squamous Epithelial / LPF 0-5 0 - 5   Mucus PRESENT    MDM UA Tylenol 1000mg  PO [place] MFM OB Transvaginal- cervical length 4.16cm  Assessment and Plan   1. Pain of round ligament during pregnancy   2. [redacted] weeks gestation of pregnancy    -Discharge home in stable condition -Encouraged patient to  use tylenol or flexeril for pain PRN. Discussed use of a maternity support belt -Preterm labor precautions discussed -Patient advised to follow-up with [facility] as scheduled for prenatal care -Patient may return to MAU as needed or if her condition were to change or worsen  Wende Mott CNM 07/28/2018, 12:10 AM

## 2018-07-28 NOTE — Progress Notes (Signed)
Theodosia Quay here for 17-P  Injection.  Injection administered without complication. Patient will return in one week for next injection.  [name], RN 07/28/2018  2:14 PM

## 2018-07-31 ENCOUNTER — Encounter (HOSPITAL_COMMUNITY): Payer: Self-pay

## 2018-08-03 ENCOUNTER — Telehealth (HOSPITAL_COMMUNITY): Payer: Self-pay | Admitting: *Deleted

## 2018-08-03 NOTE — Telephone Encounter (Signed)
Called pt with normal microarray result.  Pt voiced understanding.

## 2018-08-04 ENCOUNTER — Encounter (HOSPITAL_COMMUNITY): Payer: Self-pay

## 2018-08-04 ENCOUNTER — Ambulatory Visit (INDEPENDENT_AMBULATORY_CARE_PROVIDER_SITE_OTHER): Payer: Medicaid Other

## 2018-08-04 ENCOUNTER — Ambulatory Visit (HOSPITAL_COMMUNITY)
Admission: RE | Admit: 2018-08-04 | Discharge: 2018-08-04 | Disposition: A | Discharge status: Home / Self Care | Payer: Medicaid Other | Source: Ambulatory Visit | Attending: Obstetrics and Gynecology | Admitting: Obstetrics and Gynecology

## 2018-08-04 ENCOUNTER — Other Ambulatory Visit (HOSPITAL_COMMUNITY): Payer: Self-pay | Admitting: *Deleted

## 2018-08-04 DIAGNOSIS — O3432 Maternal care for cervical incompetence, second trimester: Secondary | ICD-10-CM | POA: Diagnosis not present

## 2018-08-04 DIAGNOSIS — O35EXX0 Maternal care for other (suspected) fetal abnormality and damage, fetal genitourinary anomalies, not applicable or unspecified: Secondary | ICD-10-CM

## 2018-08-04 DIAGNOSIS — O358XX0 Maternal care for other (suspected) fetal abnormality and damage, not applicable or unspecified: Secondary | ICD-10-CM

## 2018-08-04 DIAGNOSIS — O9989 Other specified diseases and conditions complicating pregnancy, childbirth and the puerperium: Secondary | ICD-10-CM | POA: Diagnosis not present

## 2018-08-04 DIAGNOSIS — O283 Abnormal ultrasonic finding on antenatal screening of mother: Secondary | ICD-10-CM

## 2018-08-04 DIAGNOSIS — O09299 Supervision of pregnancy with other poor reproductive or obstetric history, unspecified trimester: Secondary | ICD-10-CM

## 2018-08-04 DIAGNOSIS — Z3A23 23 weeks gestation of pregnancy: Secondary | ICD-10-CM | POA: Insufficient documentation

## 2018-08-04 DIAGNOSIS — N1330 Unspecified hydronephrosis: Secondary | ICD-10-CM | POA: Insufficient documentation

## 2018-08-04 DIAGNOSIS — Z141 Cystic fibrosis carrier: Secondary | ICD-10-CM

## 2018-08-04 DIAGNOSIS — O09899 Supervision of other high risk pregnancies, unspecified trimester: Secondary | ICD-10-CM

## 2018-08-04 NOTE — Progress Notes (Signed)
Kim Johns here for 17-P  Injection.  Injection administered without complication. Patient will return in one week for next injection.  [name], RN 08/04/2018  11:37 AM

## 2018-08-04 NOTE — Progress Notes (Signed)
I have reviewed the chart and agree with nursing staff's documentation of this patient's encounter.  Mora Bellman, MD 08/04/2018 11:52 AM

## 2018-08-06 ENCOUNTER — Encounter (HOSPITAL_COMMUNITY): Payer: Self-pay

## 2018-08-11 ENCOUNTER — Ambulatory Visit (INDEPENDENT_AMBULATORY_CARE_PROVIDER_SITE_OTHER): Payer: Medicaid Other | Admitting: General Practice

## 2018-08-11 VITALS — BP 110/57 | HR 78 | Ht 63.0 in | Wt 192.0 lb

## 2018-08-11 DIAGNOSIS — O09292 Supervision of pregnancy with other poor reproductive or obstetric history, second trimester: Secondary | ICD-10-CM

## 2018-08-11 DIAGNOSIS — O09299 Supervision of pregnancy with other poor reproductive or obstetric history, unspecified trimester: Secondary | ICD-10-CM

## 2018-08-11 NOTE — Progress Notes (Signed)
Theodosia Quay here for 17-P  Injection.  Injection administered without complication. Patient will return in one week for next injection.  Derinda Late, RN 08/11/2018  9:34 AM

## 2018-08-11 NOTE — Progress Notes (Signed)
I have reviewed this chart and agree with the RN/CMA assessment and management.    Lamesha Tibbits C Zolton Dowson, MD, FACOG Attending Physician, [facility]  

## 2018-08-13 ENCOUNTER — Ambulatory Visit (INDEPENDENT_AMBULATORY_CARE_PROVIDER_SITE_OTHER): Payer: Medicaid Other | Admitting: Family Medicine

## 2018-08-13 VITALS — BP 119/59 | HR 72 | Wt 192.0 lb

## 2018-08-13 DIAGNOSIS — O35EXX1 Maternal care for other (suspected) fetal abnormality and damage, fetal genitourinary anomalies, fetus 1: Secondary | ICD-10-CM

## 2018-08-13 DIAGNOSIS — O358XX1 Maternal care for other (suspected) fetal abnormality and damage, fetus 1: Secondary | ICD-10-CM

## 2018-08-13 DIAGNOSIS — Z141 Cystic fibrosis carrier: Secondary | ICD-10-CM

## 2018-08-13 DIAGNOSIS — O09899 Supervision of other high risk pregnancies, unspecified trimester: Secondary | ICD-10-CM

## 2018-08-13 DIAGNOSIS — O09299 Supervision of pregnancy with other poor reproductive or obstetric history, unspecified trimester: Secondary | ICD-10-CM

## 2018-08-13 DIAGNOSIS — O0990 Supervision of high risk pregnancy, unspecified, unspecified trimester: Secondary | ICD-10-CM

## 2018-08-13 NOTE — Progress Notes (Signed)
   PRENATAL VISIT NOTE  Subjective:  Kim Johns is a 27 y.o. G3P1101 at [redacted]w[redacted]d being seen today for ongoing prenatal care.  She is currently monitored for the following issues for this high-risk pregnancy and has History of cervical incompetence in pregnancy, currently pregnant; History of intrauterine fetal death, currently pregnant; Infection associated with intrauterine device (IUD) ([place]); HSV-2 (herpes simplex virus 2) infection; Anxiety; Supervision of high risk pregnancy, antepartum; Asthma; and Cystic fibrosis carrier, antepartum on their problem list.  Patient reports no complaints.  Contractions: Not present. Vag. Bleeding: None.  Movement: Present. Denies leaking of fluid.   The following portions of the patient's history were reviewed and updated as appropriate: allergies, current medications, past family history, past medical history, past social history, past surgical history and problem list. Problem list updated.  Objective:   Vitals:   08/13/18 1127  BP: (!) 119/59  Pulse: 72  Weight: 192 lb (87.1 kg)    Fetal Status: Fetal Heart Rate (bpm): 150 Fundal Height: 24 cm Movement: Present     General:  Alert, oriented and cooperative. Patient is in no acute distress.  Skin: Skin is warm and dry. No rash noted.   Cardiovascular: Normal heart rate noted  Respiratory: Normal respiratory effort, no problems with respiration noted  Abdomen: Soft, gravid, appropriate for gestational age.  Pain/Pressure: Absent     Pelvic: Cervical exam deferred        Extremities: Normal range of motion.  Edema: None  Mental Status: Normal mood and affect. Normal behavior. Normal judgment and thought content.   Assessment and Plan:  Pregnancy: G3P1101 at [redacted]w[redacted]d  1. History of cervical incompetence in pregnancy, currently pregnant S/p cerclage Continue 17 P  2. Supervision of high risk pregnancy, antepartum 28 wk labs next F/u fetal renal pyelectasis scheduled  3. Cystic fibrosis  carrier, antepartum   Preterm labor symptoms and general obstetric precautions including but not limited to vaginal bleeding, contractions, leaking of fluid and fetal movement were reviewed in detail with the patient. Please refer to After Visit Summary for other counseling recommendations.  Return in 3 weeks (on 09/03/2018) for 17 P weekly, 28 wk labs.  Future Appointments  Date Time Provider [place]  08/18/2018  9:15 AM [place]  08/27/2018 10:30 AM [place]  09/01/2018  9:30 AM WH-MFC [place] 5 WH-MFCUS MFC-US  09/03/2018 10:30 AM [place] [facility]  09/10/2018  8:15 AM Donnamae Jude, MD WOC-WOCA WOC  09/10/2018  8:50 AM WOC-WOCA LAB WOC-WOCA WOC    Donnamae Jude, MD

## 2018-08-13 NOTE — Patient Instructions (Signed)

## 2018-08-18 ENCOUNTER — Ambulatory Visit (INDEPENDENT_AMBULATORY_CARE_PROVIDER_SITE_OTHER): Payer: Medicaid Other | Admitting: General Practice

## 2018-08-18 VITALS — BP 116/64 | HR 72 | Ht 63.0 in | Wt 191.0 lb

## 2018-08-18 DIAGNOSIS — O09299 Supervision of pregnancy with other poor reproductive or obstetric history, unspecified trimester: Secondary | ICD-10-CM | POA: Diagnosis not present

## 2018-08-18 NOTE — Progress Notes (Addendum)
Theodosia Quay here for 17-P  Injection.  Injection administered without complication. Patient will return in one week for next injection.  Derinda Late, RN 08/18/2018  9:50 AM   I have reviewed the documentation and I am in agreement with the plan of care.  Earlie Server, RN, MSN, NP-BC Nurse Practitioner, [facility] for [employer], [facility] 08/26/2018 1:16 PM

## 2018-08-20 ENCOUNTER — Encounter: Payer: Self-pay | Admitting: *Deleted

## 2018-08-25 ENCOUNTER — Ambulatory Visit (INDEPENDENT_AMBULATORY_CARE_PROVIDER_SITE_OTHER): Payer: Medicaid Other

## 2018-08-25 VITALS — Wt 194.0 lb

## 2018-08-25 DIAGNOSIS — O09299 Supervision of pregnancy with other poor reproductive or obstetric history, unspecified trimester: Secondary | ICD-10-CM | POA: Diagnosis present

## 2018-08-25 MED ORDER — HYDROXYPROGESTERONE CAPROATE 275 MG/1.1ML ~~LOC~~ SOAJ
275.0000 mg | Freq: Once | SUBCUTANEOUS | Status: AC
  Administered 2018-08-25: 275 mg via SUBCUTANEOUS

## 2018-08-25 NOTE — Progress Notes (Signed)
Theodosia Quay here for 17-P  Injection.  Injection administered without complication. Patient will return in one week for next injection.  [place], [place] 08/25/2018  10:21 AM

## 2018-08-27 ENCOUNTER — Ambulatory Visit: Payer: Medicaid Other

## 2018-09-01 ENCOUNTER — Ambulatory Visit (HOSPITAL_COMMUNITY)
Admission: RE | Admit: 2018-09-01 | Discharge: 2018-09-01 | Disposition: A | Discharge status: Home / Self Care | Payer: Medicaid Other | Source: Ambulatory Visit | Attending: Family Medicine | Admitting: Family Medicine

## 2018-09-01 ENCOUNTER — Encounter (HOSPITAL_COMMUNITY): Payer: Self-pay

## 2018-09-01 ENCOUNTER — Ambulatory Visit: Payer: Medicaid Other

## 2018-09-01 ENCOUNTER — Other Ambulatory Visit (HOSPITAL_COMMUNITY): Payer: Self-pay | Admitting: *Deleted

## 2018-09-01 DIAGNOSIS — O09892 Supervision of other high risk pregnancies, second trimester: Secondary | ICD-10-CM

## 2018-09-01 DIAGNOSIS — Z362 Encounter for other antenatal screening follow-up: Secondary | ICD-10-CM | POA: Diagnosis not present

## 2018-09-01 DIAGNOSIS — O35EXX0 Maternal care for other (suspected) fetal abnormality and damage, fetal genitourinary anomalies, not applicable or unspecified: Secondary | ICD-10-CM

## 2018-09-01 DIAGNOSIS — O358XX0 Maternal care for other (suspected) fetal abnormality and damage, not applicable or unspecified: Secondary | ICD-10-CM

## 2018-09-01 DIAGNOSIS — Z3A27 27 weeks gestation of pregnancy: Secondary | ICD-10-CM | POA: Insufficient documentation

## 2018-09-01 DIAGNOSIS — O9989 Other specified diseases and conditions complicating pregnancy, childbirth and the puerperium: Secondary | ICD-10-CM | POA: Diagnosis not present

## 2018-09-01 DIAGNOSIS — O3432 Maternal care for cervical incompetence, second trimester: Secondary | ICD-10-CM | POA: Diagnosis not present

## 2018-09-01 DIAGNOSIS — O09212 Supervision of pregnancy with history of pre-term labor, second trimester: Secondary | ICD-10-CM

## 2018-09-01 DIAGNOSIS — N1330 Unspecified hydronephrosis: Secondary | ICD-10-CM | POA: Insufficient documentation

## 2018-09-03 ENCOUNTER — Ambulatory Visit: Payer: Medicaid Other

## 2018-09-08 ENCOUNTER — Ambulatory Visit (INDEPENDENT_AMBULATORY_CARE_PROVIDER_SITE_OTHER): Payer: Medicaid Other | Admitting: *Deleted

## 2018-09-08 ENCOUNTER — Other Ambulatory Visit: Payer: Self-pay | Admitting: *Deleted

## 2018-09-08 VITALS — BP 111/60 | HR 80 | Wt 198.3 lb

## 2018-09-08 DIAGNOSIS — Z8751 Personal history of pre-term labor: Secondary | ICD-10-CM

## 2018-09-08 DIAGNOSIS — O09213 Supervision of pregnancy with history of pre-term labor, third trimester: Secondary | ICD-10-CM | POA: Diagnosis present

## 2018-09-08 DIAGNOSIS — O0990 Supervision of high risk pregnancy, unspecified, unspecified trimester: Secondary | ICD-10-CM

## 2018-09-08 NOTE — Progress Notes (Signed)
Makena 275 mg administered as scheduled.  Pt tolerated well. Pt stated she has been having problems with pain in arms several days after injections take place.  She would like to consider alternate options and will discuss w/Dr. Kennon Rounds @ visit on 10/17.

## 2018-09-08 NOTE — Progress Notes (Signed)
I have reviewed the chart and agree with nursing staff's documentation of this patient's encounter.  Marcille Buffy, CNM 09/08/2018 10:52 AM

## 2018-09-10 ENCOUNTER — Ambulatory Visit (INDEPENDENT_AMBULATORY_CARE_PROVIDER_SITE_OTHER): Payer: Medicaid Other | Admitting: Family Medicine

## 2018-09-10 ENCOUNTER — Other Ambulatory Visit: Payer: Medicaid Other

## 2018-09-10 VITALS — BP 123/63 | HR 81 | Wt 199.6 lb

## 2018-09-10 DIAGNOSIS — Z23 Encounter for immunization: Secondary | ICD-10-CM | POA: Diagnosis not present

## 2018-09-10 DIAGNOSIS — O09899 Supervision of other high risk pregnancies, unspecified trimester: Secondary | ICD-10-CM | POA: Diagnosis not present

## 2018-09-10 DIAGNOSIS — O0990 Supervision of high risk pregnancy, unspecified, unspecified trimester: Secondary | ICD-10-CM

## 2018-09-10 DIAGNOSIS — Z141 Cystic fibrosis carrier: Secondary | ICD-10-CM

## 2018-09-10 DIAGNOSIS — O09299 Supervision of pregnancy with other poor reproductive or obstetric history, unspecified trimester: Secondary | ICD-10-CM | POA: Diagnosis not present

## 2018-09-10 DIAGNOSIS — O35EXX1 Maternal care for other (suspected) fetal abnormality and damage, fetal genitourinary anomalies, fetus 1: Secondary | ICD-10-CM

## 2018-09-10 DIAGNOSIS — O358XX1 Maternal care for other (suspected) fetal abnormality and damage, fetus 1: Secondary | ICD-10-CM

## 2018-09-10 NOTE — Progress Notes (Signed)
   PRENATAL VISIT NOTE  Subjective:  Kim Johns is a 27 y.o. G3P1101 at [redacted]w[redacted]d being seen today for ongoing prenatal care.  She is currently monitored for the following issues for this low-risk pregnancy and has History of cervical incompetence in pregnancy, currently pregnant; History of intrauterine fetal death, currently pregnant; Infection associated with intrauterine device (IUD) ([place]); HSV-2 (herpes simplex virus 2) infection; Anxiety; Supervision of high risk pregnancy, antepartum; Asthma; Cystic fibrosis carrier, antepartum; and Renal pyelectasis, fetal, affecting care of mother, antepartum, fetus 1 on their problem list.  Patient reports no complaints.  Contractions: Not present. Vag. Bleeding: None.  Movement: Present. Denies leaking of fluid.   The following portions of the patient's history were reviewed and updated as appropriate: allergies, current medications, past family history, past medical history, past social history, past surgical history and problem list. Problem list updated.  Objective:   Vitals:   09/10/18 0825  BP: 123/63  Pulse: 81  Weight: 199 lb 9.6 oz (90.5 kg)    Fetal Status: Fetal Heart Rate (bpm): 143 Fundal Height: 29 cm Movement: Present     General:  Alert, oriented and cooperative. Patient is in no acute distress.  Skin: Skin is warm and dry. No rash noted.   Cardiovascular: Normal heart rate noted  Respiratory: Normal respiratory effort, no problems with respiration noted  Abdomen: Soft, gravid, appropriate for gestational age.  Pain/Pressure: Absent     Pelvic: Cervical exam deferred        Extremities: Normal range of motion.  Edema: None  Mental Status: Normal mood and affect. Normal behavior. Normal judgment and thought content.   Assessment and Plan:  Pregnancy: G3P1101 at [redacted]w[redacted]d  1. Supervision of high risk pregnancy, antepartum 28 wk labs and TDaP today - Tdap vaccine greater than or equal to 7yo IM  2. History of cervical  incompetence in pregnancy, currently pregnant S/p cerclage Her 17 P is causing lumps under her skin and would like to change back to the IM version  3. Cystic fibrosis carrier, antepartum Neg amnio for baby  4. Renal pyelectasis, fetal, affecting care of mother, antepartum, fetus 1 Stable--f/u u/s on 11/5.  Preterm labor symptoms and general obstetric precautions including but not limited to vaginal bleeding, contractions, leaking of fluid and fetal movement were reviewed in detail with the patient. Please refer to After Visit Summary for other counseling recommendations.  Return in 2 weeks (on 09/24/2018) for 17 P weekly.  Future Appointments  Date Time Provider [place]  09/10/2018  8:50 AM WOC-WOCA LAB WOC-WOCA WOC  09/29/2018 10:30 AM WH-MFC [place] 5 WH-MFCUS MFC-US    Donnamae Jude, MD

## 2018-09-10 NOTE — Patient Instructions (Signed)

## 2018-09-11 LAB — RPR: RPR Ser Ql: NONREACTIVE

## 2018-09-11 LAB — CBC
HEMATOCRIT: 36.1 % (ref 34.0–46.6)
Hemoglobin: 12.2 g/dL (ref 11.1–15.9)
MCH: 32.7 pg (ref 26.6–33.0)
MCHC: 33.8 g/dL (ref 31.5–35.7)
MCV: 97 fL (ref 79–97)
PLATELETS: 208 10*3/uL (ref 150–450)
RBC: 3.73 x10E6/uL — AB (ref 3.77–5.28)
RDW: 11.9 % — AB (ref 12.3–15.4)
WBC: 7.4 10*3/uL (ref 3.4–10.8)

## 2018-09-11 LAB — GLUCOSE TOLERANCE, 2 HOURS W/ 1HR
GLUCOSE, 2 HOUR: 89 mg/dL (ref 65–152)
Glucose, 1 hour: 110 mg/dL (ref 65–179)
Glucose, Fasting: 83 mg/dL (ref 65–91)

## 2018-09-11 LAB — HIV ANTIBODY (ROUTINE TESTING W REFLEX): HIV Screen 4th Generation wRfx: NONREACTIVE

## 2018-09-15 ENCOUNTER — Encounter: Payer: Self-pay | Admitting: *Deleted

## 2018-09-15 ENCOUNTER — Ambulatory Visit (INDEPENDENT_AMBULATORY_CARE_PROVIDER_SITE_OTHER): Payer: Medicaid Other | Admitting: General Practice

## 2018-09-15 VITALS — BP 121/65 | HR 81 | Ht 63.0 in | Wt 199.0 lb

## 2018-09-15 DIAGNOSIS — O09299 Supervision of pregnancy with other poor reproductive or obstetric history, unspecified trimester: Secondary | ICD-10-CM

## 2018-09-15 MED ORDER — HYDROXYPROGESTERONE CAPROATE 250 MG/ML IM OIL
250.0000 mg | TOPICAL_OIL | INTRAMUSCULAR | Status: AC
  Administered 2018-09-15 – 2018-11-04 (×7): 250 mg via INTRAMUSCULAR

## 2018-09-15 NOTE — Progress Notes (Signed)
Kim Johns here for 17-P  Injection.  Injection administered without complication. Patient will return in one week for next injection. Patient reports raw/red skin bilaterally in groin area that itches for past several weeks. Patient states it reminds her of when she had a yeast infection there with her last pregnancy. Recommended OTC Lotrimin cream to area and return to office if it doesn't improve. Patient verbalized understanding to all & had no questions.  Derinda Late, RN 09/15/2018  11:34 AM

## 2018-09-15 NOTE — Progress Notes (Signed)
Chart reviewed for nurse visit. Agree with plan of care.   [place], [place] 09/15/2018 6:54 PM

## 2018-09-18 ENCOUNTER — Ambulatory Visit: Payer: Medicaid Other

## 2018-09-24 ENCOUNTER — Ambulatory Visit (INDEPENDENT_AMBULATORY_CARE_PROVIDER_SITE_OTHER): Payer: Medicaid Other | Admitting: Obstetrics and Gynecology

## 2018-09-24 VITALS — BP 119/68 | HR 85 | Wt 202.3 lb

## 2018-09-24 DIAGNOSIS — O358XX1 Maternal care for other (suspected) fetal abnormality and damage, fetus 1: Secondary | ICD-10-CM

## 2018-09-24 DIAGNOSIS — O35EXX1 Maternal care for other (suspected) fetal abnormality and damage, fetal genitourinary anomalies, fetus 1: Secondary | ICD-10-CM

## 2018-09-24 DIAGNOSIS — O09293 Supervision of pregnancy with other poor reproductive or obstetric history, third trimester: Secondary | ICD-10-CM

## 2018-09-24 DIAGNOSIS — B379 Candidiasis, unspecified: Secondary | ICD-10-CM | POA: Diagnosis not present

## 2018-09-24 DIAGNOSIS — O0993 Supervision of high risk pregnancy, unspecified, third trimester: Secondary | ICD-10-CM | POA: Diagnosis not present

## 2018-09-24 DIAGNOSIS — O09299 Supervision of pregnancy with other poor reproductive or obstetric history, unspecified trimester: Secondary | ICD-10-CM

## 2018-09-24 DIAGNOSIS — O0990 Supervision of high risk pregnancy, unspecified, unspecified trimester: Secondary | ICD-10-CM

## 2018-09-24 MED ORDER — MISC. DEVICES KIT: 1.0000 [IU] | PACK | Freq: Two times a day (BID) | 0 refills | Status: DC | PRN

## 2018-09-24 MED ORDER — NYSTATIN 100000 UNIT/GM EX CREA: 1.0000 "application " | TOPICAL_CREAM | Freq: Two times a day (BID) | CUTANEOUS | 1 refills | Status: DC

## 2018-09-24 NOTE — Patient Instructions (Signed)
   PREGNANCY SUPPORT BELT: °You are not alone, Seventy-five percent of women have some sort of abdominal or back pain at some point in their pregnancy. Your baby is growing at a fast pace, which means that your whole body is rapidly trying to adjust to the changes. As your uterus grows, your back may start feeling a bit under stress and this can result in back or abdominal pain that can go from mild, and therefore bearable, to severe pains that will not allow you to sit or lay down comfortably, When it comes to dealing with pregnancy-related pains and cramps, some pregnant women usually prefer natural remedies, which the market is filled with nowadays. For example, wearing a pregnancy support belt can help ease and lessen your discomfort and pain. °WHAT ARE THE BENEFITS OF WEARING A PREGNANCY SUPPORT BELT? A pregnancy support belt provides support to the lower portion of the belly taking some of the weight of the growing uterus and distributing to the other parts of your body. It is designed make you comfortable and gives you extra support. Over the years, the pregnancy apparel market has been studying the needs and wants of pregnant women and they have come up with the most comfortable pregnancy support belts that woman could ever ask for. In fact, you will no longer have to wear a stretched-out or bulky pregnancy belt that is visible underneath your clothes and makes you feel even more uncomfortable. Nowadays, a pregnancy support belt is made of comfortable and stretchy materials that will not irritate your skin but will actually make you feel at ease and you will not even notice you are wearing it. They are easy to put on and adjust during the day and can be worn at night for additional support.  °BENEFITS: °• Relives Back pain °• Relieves Abdominal Muscle and Leg Pain °• Stabilizes the Pelvic Ring °• Offers a Cushioned Abdominal Lift Pad °• Relieves pressure on the Sciatic Nerve Within Minutes °WHERE TO GET  YOUR PREGNANCY BELT: [employer] [phone] @[address] [place], [place] [address] ° ° °

## 2018-09-24 NOTE — Progress Notes (Signed)
Subjective:  Kim Johns is a 27 y.o. G3P1101 at [redacted]w[redacted]d being seen today for ongoing prenatal care.  She is currently monitored for the following issues for this high-risk pregnancy and has History of cervical incompetence in pregnancy, currently pregnant; History of intrauterine fetal death, currently pregnant; Infection associated with intrauterine device (IUD) ([place]); HSV-2 (herpes simplex virus 2) infection; Anxiety; Supervision of high risk pregnancy, antepartum; Asthma; Cystic fibrosis carrier, antepartum; Renal pyelectasis, fetal, affecting care of mother, antepartum, fetus 1; and Yeast infection on their problem list.  Patient reports desire for maternal belt. Yeast infection in grion.  Contractions: Irritability. Vag. Bleeding: None.  Movement: Present. Denies leaking of fluid.   The following portions of the patient's history were reviewed and updated as appropriate: allergies, current medications, past family history, past medical history, past social history, past surgical history and problem list. Problem list updated.  Objective:   Vitals:   09/24/18 1533  BP: 119/68  Pulse: 85  Weight: 202 lb 4.8 oz (91.8 kg)    Fetal Status: Fetal Heart Rate (bpm): 140   Movement: Present     General:  Alert, oriented and cooperative. Patient is in no acute distress.  Skin: Skin is warm and dry. No rash noted.   Cardiovascular: Normal heart rate noted  Respiratory: Normal respiratory effort, no problems with respiration noted  Abdomen: Soft, gravid, appropriate for gestational age. Pain/Pressure: Present     Pelvic:  Cervical exam deferred        Extremities: Normal range of motion.  Edema: Trace  Mental Status: Normal mood and affect. Normal behavior. Normal judgment and thought content.   Urinalysis:      Assessment and Plan:  Pregnancy: G3P1101 at [redacted]w[redacted]d  1. Supervision of high risk pregnancy, antepartum Stable  2. Renal pyelectasis, fetal, affecting care of mother, antepartum,  fetus 1 F/U U/S ordered  3. History of cervical incompetence in pregnancy, currently pregnant Cerclage in place weekly 17 OHP  4. Yeast infection  - nystatin cream (MYCOSTATIN); Apply 1 application topically 2 (two) times daily.  Dispense: 30 g; Refill: 1  Preterm labor symptoms and general obstetric precautions including but not limited to vaginal bleeding, contractions, leaking of fluid and fetal movement were reviewed in detail with the patient. Please refer to After Visit Summary for other counseling recommendations.  Return in about 2 weeks (around 10/08/2018) for OB visit.   Chancy Milroy, MD

## 2018-09-24 NOTE — Progress Notes (Signed)
Pt. States still has redness

## 2018-09-25 ENCOUNTER — Other Ambulatory Visit: Payer: Self-pay | Admitting: *Deleted

## 2018-09-25 DIAGNOSIS — Z3480 Encounter for supervision of other normal pregnancy, unspecified trimester: Secondary | ICD-10-CM

## 2018-09-25 DIAGNOSIS — M549 Dorsalgia, unspecified: Secondary | ICD-10-CM

## 2018-09-25 DIAGNOSIS — O9989 Other specified diseases and conditions complicating pregnancy, childbirth and the puerperium: Secondary | ICD-10-CM

## 2018-09-25 MED ORDER — COMFORT FIT MATERNITY SUPP LG MISC: 1.0000 | Freq: Once | 0 refills | Status: AC

## 2018-09-25 NOTE — Progress Notes (Signed)
Received a call from [facility] they need order faxed for maternity belt, the one patient brought was not sufficient. Prescription completed and faxed.

## 2018-09-28 ENCOUNTER — Telehealth: Payer: Self-pay | Admitting: *Deleted

## 2018-09-28 NOTE — Telephone Encounter (Signed)
Received a voicemessage from 09/26/18 am ( office closed) that she was seen Thursday and Dr. Durene Cal a rx for a glucose meter to her pharmacy but didn't say anything about her needing it. States nurse had told her glucose test was normal. Also states sent rx for maternity belt to [facility] and it was wrong.    I called Kim Johns and notified her that I had received a call from [facility] and that we had sent a corrected prescription. Also I reviewed her 2 hour glucose results and they were normal. I told her order for belt was written wrong and pharmacy may have mis interpreted , but she did not need to pick up meter or check sugars and I would message doctor. She also asked to change her next appt from Friday to Thursday- states she never comes on Friday.  I informed her I could not transfer her to registrar but I would send a message and ask them to call her. She voices understanding.

## 2018-09-29 ENCOUNTER — Other Ambulatory Visit (HOSPITAL_COMMUNITY): Payer: Self-pay | Admitting: Maternal and Fetal Medicine

## 2018-09-29 ENCOUNTER — Other Ambulatory Visit (HOSPITAL_COMMUNITY): Payer: Self-pay | Admitting: *Deleted

## 2018-09-29 ENCOUNTER — Encounter (HOSPITAL_COMMUNITY): Payer: Self-pay

## 2018-09-29 ENCOUNTER — Ambulatory Visit (HOSPITAL_COMMUNITY)
Admission: RE | Admit: 2018-09-29 | Discharge: 2018-09-29 | Disposition: A | Discharge status: Home / Self Care | Payer: Medicaid Other | Source: Ambulatory Visit | Attending: Family Medicine | Admitting: Family Medicine

## 2018-09-29 DIAGNOSIS — O283 Abnormal ultrasonic finding on antenatal screening of mother: Secondary | ICD-10-CM

## 2018-09-29 DIAGNOSIS — O09892 Supervision of other high risk pregnancies, second trimester: Secondary | ICD-10-CM | POA: Diagnosis not present

## 2018-09-29 DIAGNOSIS — O3432 Maternal care for cervical incompetence, second trimester: Secondary | ICD-10-CM

## 2018-09-29 DIAGNOSIS — O358XX0 Maternal care for other (suspected) fetal abnormality and damage, not applicable or unspecified: Secondary | ICD-10-CM | POA: Diagnosis not present

## 2018-09-29 DIAGNOSIS — O409XX0 Polyhydramnios, unspecified trimester, not applicable or unspecified: Secondary | ICD-10-CM

## 2018-09-29 DIAGNOSIS — Z362 Encounter for other antenatal screening follow-up: Secondary | ICD-10-CM

## 2018-09-29 DIAGNOSIS — O09213 Supervision of pregnancy with history of pre-term labor, third trimester: Secondary | ICD-10-CM

## 2018-09-29 DIAGNOSIS — O403XX0 Polyhydramnios, third trimester, not applicable or unspecified: Secondary | ICD-10-CM

## 2018-09-29 DIAGNOSIS — Z3A31 31 weeks gestation of pregnancy: Secondary | ICD-10-CM

## 2018-10-01 ENCOUNTER — Ambulatory Visit: Payer: Medicaid Other

## 2018-10-06 ENCOUNTER — Ambulatory Visit (INDEPENDENT_AMBULATORY_CARE_PROVIDER_SITE_OTHER): Payer: Medicaid Other | Admitting: General Practice

## 2018-10-06 ENCOUNTER — Telehealth: Payer: Self-pay | Admitting: General Practice

## 2018-10-06 VITALS — BP 134/68 | HR 69 | Ht 62.0 in | Wt 204.0 lb

## 2018-10-06 DIAGNOSIS — O09299 Supervision of pregnancy with other poor reproductive or obstetric history, unspecified trimester: Secondary | ICD-10-CM

## 2018-10-06 DIAGNOSIS — Z3A32 32 weeks gestation of pregnancy: Secondary | ICD-10-CM

## 2018-10-06 DIAGNOSIS — O09293 Supervision of pregnancy with other poor reproductive or obstetric history, third trimester: Secondary | ICD-10-CM | POA: Diagnosis not present

## 2018-10-06 NOTE — Telephone Encounter (Signed)
Patient called and left message on nurse voicemail line stating she thinks she forgot to get her shot last week and would like a call back. Called patient and discussed last injection was 10/31. Patient verbalized understanding and states she completely forgot. Patient states she will come in tonight for injection. Patient had no questions.

## 2018-10-06 NOTE — Progress Notes (Signed)
Theodosia Quay here for 17-P  Injection.  Injection administered without complication. Patient will return in one week for next injection.  Derinda Late, RN 10/06/2018  5:17 PM  I have reviewed the note and agree with the plan of care.  Earlie Server, RN, MSN, NP-BC Nurse Practitioner, [facility] for [employer], [place] [facility] 10/06/2018 8:55 PM

## 2018-10-08 ENCOUNTER — Ambulatory Visit (INDEPENDENT_AMBULATORY_CARE_PROVIDER_SITE_OTHER): Payer: Medicaid Other | Admitting: Obstetrics and Gynecology

## 2018-10-08 ENCOUNTER — Encounter: Payer: Self-pay | Admitting: Obstetrics and Gynecology

## 2018-10-08 VITALS — BP 133/55 | HR 84 | Wt 208.0 lb

## 2018-10-08 DIAGNOSIS — O09299 Supervision of pregnancy with other poor reproductive or obstetric history, unspecified trimester: Secondary | ICD-10-CM

## 2018-10-08 DIAGNOSIS — O35EXX1 Maternal care for other (suspected) fetal abnormality and damage, fetal genitourinary anomalies, fetus 1: Secondary | ICD-10-CM

## 2018-10-08 DIAGNOSIS — O358XX1 Maternal care for other (suspected) fetal abnormality and damage, fetus 1: Secondary | ICD-10-CM

## 2018-10-08 DIAGNOSIS — O0993 Supervision of high risk pregnancy, unspecified, third trimester: Secondary | ICD-10-CM

## 2018-10-08 DIAGNOSIS — B009 Herpesviral infection, unspecified: Secondary | ICD-10-CM

## 2018-10-08 DIAGNOSIS — O09293 Supervision of pregnancy with other poor reproductive or obstetric history, third trimester: Secondary | ICD-10-CM

## 2018-10-08 DIAGNOSIS — O0990 Supervision of high risk pregnancy, unspecified, unspecified trimester: Secondary | ICD-10-CM

## 2018-10-08 NOTE — Progress Notes (Signed)
   PRENATAL VISIT NOTE  Subjective:  Kim Johns is a 27 y.o. G3P1101 at [redacted]w[redacted]d being seen today for ongoing prenatal care.  She is currently monitored for the following issues for this high-risk pregnancy and has History of cervical incompetence in pregnancy, currently pregnant; History of intrauterine fetal death, currently pregnant; Infection associated with intrauterine device (IUD) ([place]); HSV-2 (herpes simplex virus 2) infection; Anxiety; Supervision of high risk pregnancy, antepartum; Asthma; Cystic fibrosis carrier, antepartum; Renal pyelectasis, fetal, affecting care of mother, antepartum, fetus 1; and Yeast infection on their problem list.  Patient reports no complaints.  Contractions: Irritability. Vag. Bleeding: None.  Movement: Present. Denies leaking of fluid.   The following portions of the patient's history were reviewed and updated as appropriate: allergies, current medications, past family history, past medical history, past social history, past surgical history and problem list. Problem list updated.  Objective:   Vitals:   10/08/18 1408  BP: (!) 133/55  Pulse: 84  Weight: 208 lb (94.3 kg)    Fetal Status: Fetal Heart Rate (bpm): 145 Fundal Height: 34 cm Movement: Present     General:  Alert, oriented and cooperative. Patient is in no acute distress.  Skin: Skin is warm and dry. No rash noted.   Cardiovascular: Normal heart rate noted  Respiratory: Normal respiratory effort, no problems with respiration noted  Abdomen: Soft, gravid, appropriate for gestational age.  Pain/Pressure: Present     Pelvic: Cervical exam deferred        Extremities: Normal range of motion.  Edema: Trace  Mental Status: Normal mood and affect. Normal behavior. Normal judgment and thought content.   Assessment and Plan:  Pregnancy: G3P1101 at [redacted]w[redacted]d  1. Supervision of high risk pregnancy, antepartum Patient is doing well without complaints  2. History of cervical incompetence in  pregnancy, currently pregnant Cerclage in place Continue weekly 17-P  3. HSV-2 (herpes simplex virus 2) infection Prophylaxis  4. Renal pyelectasis, fetal, affecting care of mother, antepartum, fetus 1 Patient with non-severe polyhydramnios and fetal hydronephrosis Follow up ultrasound 11/26  Preterm labor symptoms and general obstetric precautions including but not limited to vaginal bleeding, contractions, leaking of fluid and fetal movement were reviewed in detail with the patient. Please refer to After Visit Summary for other counseling recommendations.  Return in about 2 weeks (around 10/22/2018) for High risk, ROB, weekly for 17-P.  Future Appointments  Date Time Provider [place]  10/13/2018  4:50 PM WOC-WOCA NURSE WOC-WOCA [place]  10/20/2018  7:45 AM WH-MFC [place] 2 WH-MFCUS MFC-US  10/20/2018  8:45 AM [place] WOC  10/27/2018  4:20 PM WOC-WOCA NURSE WOC-WOCA WOC    Mora Bellman, MD

## 2018-10-09 ENCOUNTER — Encounter: Payer: Medicaid Other | Admitting: Obstetrics & Gynecology

## 2018-10-13 ENCOUNTER — Ambulatory Visit (INDEPENDENT_AMBULATORY_CARE_PROVIDER_SITE_OTHER): Payer: Medicaid Other | Admitting: *Deleted

## 2018-10-13 VITALS — BP 123/65 | HR 90 | Wt 207.1 lb

## 2018-10-13 DIAGNOSIS — O09293 Supervision of pregnancy with other poor reproductive or obstetric history, third trimester: Secondary | ICD-10-CM

## 2018-10-13 DIAGNOSIS — O09299 Supervision of pregnancy with other poor reproductive or obstetric history, unspecified trimester: Secondary | ICD-10-CM

## 2018-10-13 NOTE — Progress Notes (Addendum)
Kim Johns here for 17-P  Injection.  Injection administered without complication. Patient will return in one week for next injection.  Dolores Hoose, RN 10/13/2018  11:54 AM   [facility] mailing refills of pt's 17-p today, 10/13/18.  Information provided by [facility] [employer].

## 2018-10-13 NOTE — Progress Notes (Signed)
Chart reviewed for nurse visit. Agree with plan of care.   Starr Lake, CNM 10/13/2018 12:13 PM

## 2018-10-19 ENCOUNTER — Encounter (HOSPITAL_COMMUNITY): Payer: Self-pay

## 2018-10-20 ENCOUNTER — Other Ambulatory Visit (HOSPITAL_COMMUNITY): Payer: Self-pay | Admitting: *Deleted

## 2018-10-20 ENCOUNTER — Ambulatory Visit (HOSPITAL_COMMUNITY)
Admission: RE | Admit: 2018-10-20 | Discharge: 2018-10-20 | Disposition: A | Discharge status: Home / Self Care | Payer: Medicaid Other | Source: Ambulatory Visit | Attending: Family Medicine | Admitting: Family Medicine

## 2018-10-20 ENCOUNTER — Ambulatory Visit (INDEPENDENT_AMBULATORY_CARE_PROVIDER_SITE_OTHER): Payer: Medicaid Other | Admitting: Advanced Practice Midwife

## 2018-10-20 ENCOUNTER — Ambulatory Visit: Payer: Medicaid Other

## 2018-10-20 ENCOUNTER — Other Ambulatory Visit (HOSPITAL_COMMUNITY): Payer: Self-pay | Admitting: Maternal & Fetal Medicine

## 2018-10-20 ENCOUNTER — Encounter (HOSPITAL_COMMUNITY): Payer: Self-pay

## 2018-10-20 ENCOUNTER — Other Ambulatory Visit (HOSPITAL_COMMUNITY)
Admission: RE | Admit: 2018-10-20 | Discharge: 2018-10-20 | Disposition: A | Discharge status: Home / Self Care | Payer: Medicaid Other | Source: Ambulatory Visit | Attending: Advanced Practice Midwife | Admitting: Advanced Practice Midwife

## 2018-10-20 VITALS — BP 130/62 | HR 82 | Wt 214.0 lb

## 2018-10-20 DIAGNOSIS — O09299 Supervision of pregnancy with other poor reproductive or obstetric history, unspecified trimester: Secondary | ICD-10-CM

## 2018-10-20 DIAGNOSIS — O409XX0 Polyhydramnios, unspecified trimester, not applicable or unspecified: Secondary | ICD-10-CM

## 2018-10-20 DIAGNOSIS — O358XX1 Maternal care for other (suspected) fetal abnormality and damage, fetus 1: Secondary | ICD-10-CM

## 2018-10-20 DIAGNOSIS — Z3A34 34 weeks gestation of pregnancy: Secondary | ICD-10-CM | POA: Diagnosis not present

## 2018-10-20 DIAGNOSIS — O3433 Maternal care for cervical incompetence, third trimester: Secondary | ICD-10-CM | POA: Diagnosis not present

## 2018-10-20 DIAGNOSIS — O403XX0 Polyhydramnios, third trimester, not applicable or unspecified: Secondary | ICD-10-CM

## 2018-10-20 DIAGNOSIS — O0990 Supervision of high risk pregnancy, unspecified, unspecified trimester: Secondary | ICD-10-CM | POA: Insufficient documentation

## 2018-10-20 DIAGNOSIS — O368130 Decreased fetal movements, third trimester, not applicable or unspecified: Secondary | ICD-10-CM

## 2018-10-20 DIAGNOSIS — O283 Abnormal ultrasonic finding on antenatal screening of mother: Secondary | ICD-10-CM | POA: Diagnosis not present

## 2018-10-20 DIAGNOSIS — O35EXX1 Maternal care for other (suspected) fetal abnormality and damage, fetal genitourinary anomalies, fetus 1: Secondary | ICD-10-CM

## 2018-10-20 DIAGNOSIS — O09293 Supervision of pregnancy with other poor reproductive or obstetric history, third trimester: Secondary | ICD-10-CM

## 2018-10-20 DIAGNOSIS — O359XX0 Maternal care for (suspected) fetal abnormality and damage, unspecified, not applicable or unspecified: Secondary | ICD-10-CM

## 2018-10-20 DIAGNOSIS — O0993 Supervision of high risk pregnancy, unspecified, third trimester: Secondary | ICD-10-CM

## 2018-10-20 DIAGNOSIS — O09213 Supervision of pregnancy with history of pre-term labor, third trimester: Secondary | ICD-10-CM

## 2018-10-20 NOTE — Patient Instructions (Signed)
Considering Waterbirth? Guide for patients at [facility]  Why consider waterbirth?  . Gentle birth for babies . Less pain medicine used in labor . May allow for passive descent/less pushing . May reduce perineal tears  . More mobility and instinctive maternal position changes . Increased maternal relaxation . Reduced blood pressure in labor  Is waterbirth safe? What are the risks of infection, drowning or other complications?  . Infection: o Very low risk (3.7 % for tub vs 4.8% for bed) o 7 in 8000 waterbirths with documented infection o Poorly cleaned equipment most common cause o Slightly lower group B strep transmission rate  . Drowning o Maternal:  - Very low risk   - Related to seizures or fainting o Newborn:  - Very low risk. No evidence of increased risk of respiratory problems in multiple large studies - Physiological protection from breathing under water - Avoid underwater birth if there are any fetal complications - Once baby's head is out of the water, keep it out.  . Birth complication o Some reports of cord trauma, but risk decreased by bringing baby to surface gradually o No evidence of increased risk of shoulder dystocia. Mothers can usually change positions faster in water than in a bed, possibly aiding the maneuvers to free the shoulder.   You must attend a Waterbirth class [facility]  3rd Wednesday of every month from 7-9pm  Free  Register by calling [phone] or online at [email or web address]  Bring us the certificate from the class to your prenatal appointment  Meet with a midwife at 36 weeks to see if you can still plan a waterbirth and to sign the consent.   Purchase or rent the following supplies: You are responsible for providing all supplies listed above. **If you do not have all necessary supplies you cannot have a waterbirth.**   Water Birth Pool (Birth Pool in a Box or [place] for instance)  (Tubs start  ~$125)  Single-use disposable tub liner designed for your brand of tub  Electric drain pump to remove water (We recommend 792 gallon per hour or greater pump.)   New garden hose labeled "lead-free", "suitable for drinking water",  Separate garden hose to remove the dirty water  Fish net  Bathing suit top (optional)  Long-handled mirror (optional)  Places to purchase or rent supplies:   Yourwaterbirth.com for tub purchases and supplies  Waterbirthsolutions.com for tub purchases and supplies  [employer] (www.thelaborladies.com) $275 for tub rental/set-up & take down/kit   [place] [employer] (http://www.padanc.org/MeetUs.htm) Information regarding doulas (labor support) who provide pool rentals  Things that would prevent you from having a waterbirth:  Premature, <37wks  Previous cesarean birth  Presence of thick meconium-stained fluid  Multiple gestation (Twins, triplets, etc.)  Uncontrolled diabetes or gestational diabetes requiring medication  Hypertension requiring medication or diagnosis of pre-eclampsia  Heavy vaginal bleeding  Non-reassuring fetal heart rate  Active infection (MRSA, etc.). Group B Strep is NOT a contraindication for waterbirth.  If your labor has to be induced and induction method requires continuous monitoring of the baby's heart rate  Other risks/issues identified by your obstetrical provider  Please remember that birth is unpredictable. Under certain unforeseeable circumstances your provider may advise against giving birth in the tub. These decisions will be made on a case-by-case basis and with the safety of you and your baby as our highest priority.    

## 2018-10-20 NOTE — Progress Notes (Signed)
   PRENATAL VISIT NOTE  Subjective:  Kim Johns is a 27 y.o. G3P1101 at [redacted]w[redacted]d being seen today for ongoing prenatal care.  She is currently monitored for the following issues for this high-risk pregnancy and has History of cervical incompetence in pregnancy, currently pregnant; History of intrauterine fetal death, currently pregnant; Infection associated with intrauterine device (IUD) ([place]); HSV-2 (herpes simplex virus 2) infection; Anxiety; Supervision of high risk pregnancy, antepartum; Asthma; Cystic fibrosis carrier, antepartum; Renal pyelectasis, fetal, affecting care of mother, antepartum, fetus 1; and Yeast infection on their problem list.  Patient reports no complaints and vaginal discharge. Pt concerned because [place] today showed much lower AFI (28 decreased to 14) BPP 8/10 two off for no fetal breathing mvmt and pt felt like baby wasn't moving well. [place] still showed hydronephrosis.  Contractions: Irritability. Vag. Bleeding: None.  Movement: Present. Unsure if she is leaking of fluid.   The following portions of the patient's history were reviewed and updated as appropriate: allergies, current medications, past family history, past medical history, past social history, past surgical history and problem list. Problem list updated.  Objective:   Vitals:   10/20/18 1105  BP: 130/62  Pulse: 82  Weight: 214 lb (97.1 kg)    Fetal Status: Fetal Heart Rate (bpm): 154 Fundal Height: 35 cm Movement: Present  Presentation: Vertex  General:  Alert, oriented and cooperative. Patient is in no acute distress.  Skin: Skin is warm and dry. No rash noted.   Cardiovascular: Normal heart rate noted  Respiratory: Normal respiratory effort, no problems with respiration noted  Abdomen: Soft, gravid, appropriate for gestational age.  Pain/Pressure: Present     Pelvic: Cervical exam performed Dilation: Closed Effacement (%): 0 Station: -3. Neg pool [employer].   Extremities: Normal range of motion.  Edema:  Trace  Mental Status: Normal mood and affect. Normal behavior. Normal judgment and thought content.   Assessment and Plan:  Pregnancy: G3P1101 at [redacted]w[redacted]d  1. History of intrauterine fetal death, currently pregnant-2/2 incompetent cervix.  No signs of PTL   2. History of cervical incompetence in pregnancy, currently pregnant - Cervix closed and log today - remove cerclage at 36 weeks.   3. Supervision of high risk pregnancy, antepartum   4. Renal pyelectasis, fetal, affecting care of mother, antepartum, fetus 1  Biophysical profile 8/10 (-2 for breathing with RNST)  Normal interval growth.  Fetal Hydronephrosis still persist  5. Polyhydramnios  6. Vaginal discharge pregnancy - Fern neg - Affirm pending.   7. Decreased fetal mvmt - Pt still concerned about change in AFI and decreased fetal mvmt and asking why she is not having a BPP next week. Will schedule one due to Hx  ---------------------------------------------------------------------- Recommendations  Follow up growth in 4 weeks  Antenatal testing as clinically indicated.  Postnatal follow up of renal hydronephrosis  Preterm labor symptoms and general obstetric precautions including but not limited to vaginal bleeding, contractions, leaking of fluid and fetal movement were reviewed in detail with the patient. Please refer to After Visit Summary for other counseling recommendations.  Return in about 1 week (around 10/27/2018) for [place].  Future Appointments  Date Time Provider [place]  10/27/2018  4:20 PM [place]  11/16/2018  3:15 PM [place] Jackson Marbella Markgraf, CNM

## 2018-10-20 NOTE — Procedures (Signed)
Kim Johns 05-30-1991 [redacted]w[redacted]d  Fetus A Non-Stress Test Interpretation for 10/20/18  Indication: Unsatisfactory BPP  Fetal Heart Rate A Mode: External Baseline Rate (A): 130 bpm Variability: Moderate Accelerations: 15 x 15 Decelerations: None Multiple birth?: No  Uterine Activity Mode: Toco Contraction Frequency (min): none noted  Interpretation (Fetal Testing) Nonstress Test Interpretation: Reactive Comments: FHR tracing rev'd by Dr. Gertie Exon

## 2018-10-21 LAB — CERVICOVAGINAL ANCILLARY ONLY
BACTERIAL VAGINITIS: NEGATIVE
Candida vaginitis: NEGATIVE
Chlamydia: NEGATIVE
Neisseria Gonorrhea: NEGATIVE
TRICH (WINDOWPATH): NEGATIVE

## 2018-10-27 ENCOUNTER — Ambulatory Visit (INDEPENDENT_AMBULATORY_CARE_PROVIDER_SITE_OTHER): Payer: Medicaid Other | Admitting: *Deleted

## 2018-10-27 VITALS — BP 121/69 | HR 93 | Wt 212.3 lb

## 2018-10-27 DIAGNOSIS — O09293 Supervision of pregnancy with other poor reproductive or obstetric history, third trimester: Secondary | ICD-10-CM | POA: Diagnosis not present

## 2018-10-27 DIAGNOSIS — O09299 Supervision of pregnancy with other poor reproductive or obstetric history, unspecified trimester: Secondary | ICD-10-CM

## 2018-10-27 NOTE — Progress Notes (Signed)
Theodosia Quay here for 17-P  Injection.  Injection administered without complication. Patient will return in one week for next injection.  Linda,RN 10/27/2018  5:12 PM

## 2018-10-27 NOTE — Progress Notes (Signed)
Chart reviewed for nurse visit. Agree with plan of care.   Starr Lake, [place] 10/27/2018 8:05 PM

## 2018-11-04 ENCOUNTER — Encounter: Payer: Self-pay | Admitting: Obstetrics and Gynecology

## 2018-11-04 ENCOUNTER — Ambulatory Visit (INDEPENDENT_AMBULATORY_CARE_PROVIDER_SITE_OTHER): Payer: Medicaid Other | Admitting: Obstetrics and Gynecology

## 2018-11-04 ENCOUNTER — Ambulatory Visit (INDEPENDENT_AMBULATORY_CARE_PROVIDER_SITE_OTHER): Payer: Medicaid Other | Admitting: General Practice

## 2018-11-04 VITALS — BP 128/82 | HR 86 | Wt 211.0 lb

## 2018-11-04 DIAGNOSIS — O0990 Supervision of high risk pregnancy, unspecified, unspecified trimester: Secondary | ICD-10-CM

## 2018-11-04 DIAGNOSIS — O358XX0 Maternal care for other (suspected) fetal abnormality and damage, not applicable or unspecified: Secondary | ICD-10-CM

## 2018-11-04 DIAGNOSIS — O358XX1 Maternal care for other (suspected) fetal abnormality and damage, fetus 1: Secondary | ICD-10-CM

## 2018-11-04 DIAGNOSIS — O09293 Supervision of pregnancy with other poor reproductive or obstetric history, third trimester: Secondary | ICD-10-CM

## 2018-11-04 DIAGNOSIS — O368130 Decreased fetal movements, third trimester, not applicable or unspecified: Secondary | ICD-10-CM

## 2018-11-04 DIAGNOSIS — O0993 Supervision of high risk pregnancy, unspecified, third trimester: Secondary | ICD-10-CM

## 2018-11-04 DIAGNOSIS — Z141 Cystic fibrosis carrier: Secondary | ICD-10-CM

## 2018-11-04 DIAGNOSIS — O35EXX1 Maternal care for other (suspected) fetal abnormality and damage, fetal genitourinary anomalies, fetus 1: Secondary | ICD-10-CM

## 2018-11-04 DIAGNOSIS — O403XX0 Polyhydramnios, third trimester, not applicable or unspecified: Secondary | ICD-10-CM

## 2018-11-04 DIAGNOSIS — Z87898 Personal history of other specified conditions: Secondary | ICD-10-CM

## 2018-11-04 DIAGNOSIS — O09893 Supervision of other high risk pregnancies, third trimester: Secondary | ICD-10-CM

## 2018-11-04 DIAGNOSIS — O09299 Supervision of pregnancy with other poor reproductive or obstetric history, unspecified trimester: Secondary | ICD-10-CM

## 2018-11-04 DIAGNOSIS — B009 Herpesviral infection, unspecified: Secondary | ICD-10-CM

## 2018-11-04 DIAGNOSIS — Z419 Encounter for procedure for purposes other than remedying health state, unspecified: Secondary | ICD-10-CM

## 2018-11-04 DIAGNOSIS — O09899 Supervision of other high risk pregnancies, unspecified trimester: Secondary | ICD-10-CM

## 2018-11-04 MED ORDER — VALACYCLOVIR HCL 500 MG PO TABS: 500.0000 mg | ORAL_TABLET | Freq: Two times a day (BID) | ORAL | 6 refills | Status: DC

## 2018-11-04 NOTE — Progress Notes (Signed)
   PRENATAL VISIT NOTE  Subjective:  Kim Johns is a 27 y.o. G3P1101 at [redacted]w[redacted]d being seen today for ongoing prenatal care.  She is currently monitored for the following issues for this high-risk pregnancy and has History of cervical incompetence in pregnancy, currently pregnant; History of intrauterine fetal death, currently pregnant; Infection associated with intrauterine device (IUD) ([place]); HSV-2 (herpes simplex virus 2) infection; Anxiety; Supervision of high risk pregnancy, antepartum; Asthma; Cystic fibrosis carrier, antepartum; Renal pyelectasis, fetal, affecting care of mother, antepartum, fetus 1; Yeast infection; and Polyhydramnios in third trimester on their problem list.  Patient reports no complaints.  Contractions: Not present. Vag. Bleeding: None.  Movement: Present. Denies leaking of fluid.   The following portions of the patient's history were reviewed and updated as appropriate: allergies, current medications, past family history, past medical history, past social history, past surgical history and problem list. Problem list updated.  Objective:   Vitals:   11/04/18 1040  BP: 128/82  Pulse: 86  Weight: 211 lb (95.7 kg)    Fetal Status: Fetal Heart Rate (bpm): 133   Movement: Present     General:  Alert, oriented and cooperative. Patient is in no acute distress.  Skin: Skin is warm and dry. No rash noted.   Cardiovascular: Normal heart rate noted  Respiratory: Normal respiratory effort, no problems with respiration noted  Abdomen: Soft, gravid, appropriate for gestational age.  Pain/Pressure: Present     Pelvic: Cervical exam deferred        Extremities: Normal range of motion.  Edema: None  Mental Status: Normal mood and affect. Normal behavior. Normal judgment and thought content.   Assessment and Plan:  Pregnancy: G3P1101 at [redacted]w[redacted]d  1. Supervision of high risk pregnancy, antepartum GBS today  2. Cystic fibrosis carrier, antepartum  3. Polyhydramnios in third  trimester complication, single or unspecified fetus Resolved 10/20/18 NST today   4. HSV-2 (herpes simplex virus 2) infection Valtrex suppression sent today, patient denies symptoms currently  5. History of cervical incompetence in pregnancy, currently pregnant Cerclage removed intact, no issues  6. History of intrauterine fetal death, currently pregnant  7. Renal pyelectasis, fetal, affecting care of mother, antepartum, fetus 1 Will need [place] pp   Preterm labor symptoms and general obstetric precautions including but not limited to vaginal bleeding, contractions, leaking of fluid and fetal movement were reviewed in detail with the patient. Please refer to After Visit Summary for other counseling recommendations.  Return in about 1 week (around 11/11/2018) for OB visit (MD).  Future Appointments  Date Time Provider [place]  11/11/2018  2:35 PM Constant, Vickii Chafe, MD [facility] WOC  11/16/2018  3:15 PM [place]    Sloan Leiter, MD

## 2018-11-04 NOTE — Progress Notes (Signed)
Patient here today for NST for previous decreased fetal movement/patient reassurance. BPP/AFI not indicated per Dr Rosana Hoes. NST found to be reactive. Patient will follow up as previously scheduled.  Koren Bound RN BSN 11/04/18

## 2018-11-04 NOTE — Progress Notes (Signed)
I have reviewed this chart and agree with the RN/CMA assessment and management.    K. Meryl Davis, M.D. Attending [facility] (Faculty Practice)   

## 2018-11-07 LAB — CULTURE, BETA STREP (GROUP B ONLY): Strep Gp B Culture: POSITIVE — AB

## 2018-11-11 ENCOUNTER — Encounter: Payer: Self-pay | Admitting: Obstetrics and Gynecology

## 2018-11-11 ENCOUNTER — Ambulatory Visit (INDEPENDENT_AMBULATORY_CARE_PROVIDER_SITE_OTHER): Payer: Medicaid Other | Admitting: Obstetrics and Gynecology

## 2018-11-11 VITALS — BP 138/82 | HR 90 | Wt 213.6 lb

## 2018-11-11 DIAGNOSIS — O0993 Supervision of high risk pregnancy, unspecified, third trimester: Secondary | ICD-10-CM

## 2018-11-11 DIAGNOSIS — O0990 Supervision of high risk pregnancy, unspecified, unspecified trimester: Secondary | ICD-10-CM

## 2018-11-11 DIAGNOSIS — O35EXX1 Maternal care for other (suspected) fetal abnormality and damage, fetal genitourinary anomalies, fetus 1: Secondary | ICD-10-CM

## 2018-11-11 DIAGNOSIS — O09293 Supervision of pregnancy with other poor reproductive or obstetric history, third trimester: Secondary | ICD-10-CM

## 2018-11-11 DIAGNOSIS — O358XX1 Maternal care for other (suspected) fetal abnormality and damage, fetus 1: Secondary | ICD-10-CM

## 2018-11-11 DIAGNOSIS — O09299 Supervision of pregnancy with other poor reproductive or obstetric history, unspecified trimester: Secondary | ICD-10-CM

## 2018-11-11 NOTE — Progress Notes (Signed)
   PRENATAL VISIT NOTE  Subjective:  Kim Johns is a 27 y.o. G3P1101 at [redacted]w[redacted]d being seen today for ongoing prenatal care.  She is currently monitored for the following issues for this high-risk pregnancy and has History of cervical incompetence in pregnancy, currently pregnant; History of intrauterine fetal death, currently pregnant; Infection associated with intrauterine device (IUD) ([place]); HSV-2 (herpes simplex virus 2) infection; Anxiety; Supervision of high risk pregnancy, antepartum; Asthma; Cystic fibrosis carrier, antepartum; Renal pyelectasis, fetal, affecting care of mother, antepartum, fetus 1; Yeast infection; and Polyhydramnios in third trimester on their problem list.  Patient reports no complaints.  Contractions: Irritability. Vag. Bleeding: None.  Movement: Present. Denies leaking of fluid.   The following portions of the patient's history were reviewed and updated as appropriate: allergies, current medications, past family history, past medical history, past social history, past surgical history and problem list. Problem list updated.  Objective:   Vitals:   11/11/18 1501  BP: 138/82  Pulse: 90  Weight: 213 lb 9.6 oz (96.9 kg)    Fetal Status: Fetal Heart Rate (bpm): 131 Fundal Height: 37 cm Movement: Present  Presentation: Vertex  General:  Alert, oriented and cooperative. Patient is in no acute distress.  Skin: Skin is warm and dry. No rash noted.   Cardiovascular: Normal heart rate noted  Respiratory: Normal respiratory effort, no problems with respiration noted  Abdomen: Soft, gravid, appropriate for gestational age.  Pain/Pressure: Present     Pelvic: Cervical exam performed Dilation: 2 Effacement (%): 50 Station: -3  Extremities: Normal range of motion.  Edema: None  Mental Status: Normal mood and affect. Normal behavior. Normal judgment and thought content.   Assessment and Plan:  Pregnancy: G3P1101 at [redacted]w[redacted]d  1. Supervision of high risk pregnancy,  antepartum Patient is doing well without complaints  2. Renal pyelectasis, fetal, affecting care of mother, antepartum, fetus 1 Neonatal follow up  3. History of cervical incompetence in pregnancy, currently pregnant S/p cerclage  Term labor symptoms and general obstetric precautions including but not limited to vaginal bleeding, contractions, leaking of fluid and fetal movement were reviewed in detail with the patient. Please refer to After Visit Summary for other counseling recommendations.  No follow-ups on file.  Future Appointments  Date Time Provider [place]  11/16/2018  3:15 PM WH-MFC [place] 4 WH-MFCUS MFC-US    Marigrace Mccole, MD

## 2018-11-16 ENCOUNTER — Encounter (HOSPITAL_COMMUNITY): Payer: Self-pay

## 2018-11-16 ENCOUNTER — Ambulatory Visit (INDEPENDENT_AMBULATORY_CARE_PROVIDER_SITE_OTHER): Payer: Medicaid Other | Admitting: Family Medicine

## 2018-11-16 ENCOUNTER — Ambulatory Visit (HOSPITAL_COMMUNITY)
Admission: RE | Admit: 2018-11-16 | Discharge: 2018-11-16 | Disposition: A | Discharge status: Home / Self Care | Payer: Medicaid Other | Source: Ambulatory Visit | Attending: Obstetrics and Gynecology | Admitting: Obstetrics and Gynecology

## 2018-11-16 ENCOUNTER — Other Ambulatory Visit (HOSPITAL_COMMUNITY): Payer: Self-pay | Admitting: Maternal & Fetal Medicine

## 2018-11-16 VITALS — BP 126/81 | HR 100 | Wt 216.5 lb

## 2018-11-16 DIAGNOSIS — O09213 Supervision of pregnancy with history of pre-term labor, third trimester: Secondary | ICD-10-CM | POA: Diagnosis not present

## 2018-11-16 DIAGNOSIS — N1330 Unspecified hydronephrosis: Secondary | ICD-10-CM | POA: Diagnosis not present

## 2018-11-16 DIAGNOSIS — O3433 Maternal care for cervical incompetence, third trimester: Secondary | ICD-10-CM

## 2018-11-16 DIAGNOSIS — O358XX0 Maternal care for other (suspected) fetal abnormality and damage, not applicable or unspecified: Secondary | ICD-10-CM | POA: Insufficient documentation

## 2018-11-16 DIAGNOSIS — O358XX1 Maternal care for other (suspected) fetal abnormality and damage, fetus 1: Secondary | ICD-10-CM

## 2018-11-16 DIAGNOSIS — O09299 Supervision of pregnancy with other poor reproductive or obstetric history, unspecified trimester: Secondary | ICD-10-CM

## 2018-11-16 DIAGNOSIS — O09899 Supervision of other high risk pregnancies, unspecified trimester: Secondary | ICD-10-CM

## 2018-11-16 DIAGNOSIS — O35EXX1 Maternal care for other (suspected) fetal abnormality and damage, fetal genitourinary anomalies, fetus 1: Secondary | ICD-10-CM

## 2018-11-16 DIAGNOSIS — O359XX0 Maternal care for (suspected) fetal abnormality and damage, unspecified, not applicable or unspecified: Secondary | ICD-10-CM

## 2018-11-16 DIAGNOSIS — O09293 Supervision of pregnancy with other poor reproductive or obstetric history, third trimester: Secondary | ICD-10-CM

## 2018-11-16 DIAGNOSIS — B009 Herpesviral infection, unspecified: Secondary | ICD-10-CM

## 2018-11-16 DIAGNOSIS — O0993 Supervision of high risk pregnancy, unspecified, third trimester: Secondary | ICD-10-CM

## 2018-11-16 DIAGNOSIS — O283 Abnormal ultrasonic finding on antenatal screening of mother: Secondary | ICD-10-CM

## 2018-11-16 DIAGNOSIS — O0990 Supervision of high risk pregnancy, unspecified, unspecified trimester: Secondary | ICD-10-CM

## 2018-11-16 DIAGNOSIS — Z3A38 38 weeks gestation of pregnancy: Secondary | ICD-10-CM | POA: Diagnosis not present

## 2018-11-16 DIAGNOSIS — Z141 Cystic fibrosis carrier: Secondary | ICD-10-CM

## 2018-11-16 DIAGNOSIS — O403XX0 Polyhydramnios, third trimester, not applicable or unspecified: Secondary | ICD-10-CM

## 2018-11-16 NOTE — Progress Notes (Signed)
   PRENATAL VISIT NOTE  Subjective:  Kim Johns is a 27 y.o. G3P1101 at [redacted]w[redacted]d being seen today for ongoing prenatal care.  She is currently monitored for the following issues for this high-risk pregnancy and has History of cervical incompetence in pregnancy, currently pregnant; History of intrauterine fetal death, currently pregnant; HSV-2 (herpes simplex virus 2) infection; Anxiety; Supervision of high risk pregnancy, antepartum; Asthma; Cystic fibrosis carrier, antepartum; Renal pyelectasis, fetal, affecting care of mother, antepartum, fetus 1; and Yeast infection on their problem list.  Patient reports no complaints.  Contractions: Irregular. Vag. Bleeding: None.  Movement: Present. Denies leaking of fluid.   The following portions of the patient's history were reviewed and updated as appropriate: allergies, current medications, past family history, past medical history, past social history, past surgical history and problem list. Problem list updated.  Objective:   Vitals:   11/16/18 1105  BP: 126/81  Pulse: 100  Weight: 216 lb 8 oz (98.2 kg)    Fetal Status: Fetal Heart Rate (bpm): 145 Fundal Height: 40 cm Movement: Present  Presentation: Vertex  General:  Alert, oriented and cooperative. Patient is in no acute distress.  Skin: Skin is warm and dry. No rash noted.   Cardiovascular: Normal heart rate noted  Respiratory: Normal respiratory effort, no problems with respiration noted  Abdomen: Soft, gravid, appropriate for gestational age.  Pain/Pressure: Present     Pelvic: Cervical exam performed Dilation: 3 Effacement (%): 50 Station: -3  Extremities: Normal range of motion.  Edema: None  Mental Status: Normal mood and affect. Normal behavior. Normal judgment and thought content.   Assessment and Plan:  Pregnancy: G3P1101 at [redacted]w[redacted]d  1. Supervision of high risk pregnancy, antepartum FHT and FH normal  2. HSV-2 (herpes simplex virus 2) infection On valtrex  3. History of  cervical incompetence in pregnancy, currently pregnant S/p cerclage  4. History of intrauterine fetal death, currently pregnant At [redacted] weeks. No antenatal testing needed  5. Renal pyelectasis, fetal, affecting care of mother, antepartum, fetus 1 F/u US after delivery  Term labor symptoms and general obstetric precautions including but not limited to vaginal bleeding, contractions, leaking of fluid and fetal movement were reviewed in detail with the patient. Please refer to After Visit Summary for other counseling recommendations.  Return in about 1 week (around 11/23/2018) for HR OB f/u.  Future Appointments  Date Time Provider [place]  11/16/2018  3:15 PM [place], DO

## 2018-11-22 ENCOUNTER — Inpatient Hospital Stay (HOSPITAL_COMMUNITY)
Admission: AD | Admit: 2018-11-22 | Discharge: 2018-11-24 | DRG: 806 | Disposition: A | Discharge status: Home / Self Care | Payer: Medicaid Other | Attending: Obstetrics and Gynecology | Admitting: Obstetrics and Gynecology

## 2018-11-22 ENCOUNTER — Other Ambulatory Visit: Payer: Self-pay

## 2018-11-22 ENCOUNTER — Encounter (HOSPITAL_COMMUNITY): Payer: Self-pay

## 2018-11-22 DIAGNOSIS — A6000 Herpesviral infection of urogenital system, unspecified: Secondary | ICD-10-CM | POA: Diagnosis present

## 2018-11-22 DIAGNOSIS — O35EXX1 Maternal care for other (suspected) fetal abnormality and damage, fetal genitourinary anomalies, fetus 1: Secondary | ICD-10-CM

## 2018-11-22 DIAGNOSIS — O99824 Streptococcus B carrier state complicating childbirth: Secondary | ICD-10-CM

## 2018-11-22 DIAGNOSIS — O09899 Supervision of other high risk pregnancies, unspecified trimester: Secondary | ICD-10-CM

## 2018-11-22 DIAGNOSIS — O358XX1 Maternal care for other (suspected) fetal abnormality and damage, fetus 1: Secondary | ICD-10-CM

## 2018-11-22 DIAGNOSIS — Z141 Cystic fibrosis carrier: Secondary | ICD-10-CM | POA: Diagnosis not present

## 2018-11-22 DIAGNOSIS — Z3A39 39 weeks gestation of pregnancy: Secondary | ICD-10-CM

## 2018-11-22 DIAGNOSIS — O358XX0 Maternal care for other (suspected) fetal abnormality and damage, not applicable or unspecified: Secondary | ICD-10-CM | POA: Diagnosis present

## 2018-11-22 DIAGNOSIS — O0990 Supervision of high risk pregnancy, unspecified, unspecified trimester: Secondary | ICD-10-CM

## 2018-11-22 DIAGNOSIS — Z87891 Personal history of nicotine dependence: Secondary | ICD-10-CM

## 2018-11-22 DIAGNOSIS — O9832 Other infections with a predominantly sexual mode of transmission complicating childbirth: Secondary | ICD-10-CM | POA: Diagnosis present

## 2018-11-22 DIAGNOSIS — Z23 Encounter for immunization: Secondary | ICD-10-CM | POA: Diagnosis not present

## 2018-11-22 DIAGNOSIS — Z3483 Encounter for supervision of other normal pregnancy, third trimester: Secondary | ICD-10-CM | POA: Diagnosis present

## 2018-11-22 LAB — TYPE AND SCREEN
ABO/RH(D): O POS
Antibody Screen: NEGATIVE

## 2018-11-22 LAB — CBC
HCT: 39.1 % (ref 36.0–46.0)
Hemoglobin: 13.7 g/dL (ref 12.0–15.0)
MCH: 33.7 pg (ref 26.0–34.0)
MCHC: 35 g/dL (ref 30.0–36.0)
MCV: 96.3 fL (ref 80.0–100.0)
Platelets: 209 10*3/uL (ref 150–400)
RBC: 4.06 MIL/uL (ref 3.87–5.11)
RDW: 13.3 % (ref 11.5–15.5)
WBC: 9.7 10*3/uL (ref 4.0–10.5)
nRBC: 0 % (ref 0.0–0.2)

## 2018-11-22 MED ORDER — PRENATAL MULTIVITAMIN CH
1.0000 | ORAL_TABLET | Freq: Every day | ORAL | Status: DC
  Administered 2018-11-23 – 2018-11-24 (×2): 1 via ORAL
  Filled 2018-11-22 (×2): qty 1

## 2018-11-22 MED ORDER — SODIUM CHLORIDE 0.9 % IV SOLN
5.0000 10*6.[IU] | Freq: Once | INTRAVENOUS | Status: AC
  Administered 2018-11-22: 5 10*6.[IU] via INTRAVENOUS
  Filled 2018-11-22: qty 5

## 2018-11-22 MED ORDER — WITCH HAZEL-GLYCERIN EX PADS: 1.0000 "application " | MEDICATED_PAD | CUTANEOUS | Status: DC | PRN

## 2018-11-22 MED ORDER — ACETAMINOPHEN 325 MG PO TABS: 650.0000 mg | ORAL_TABLET | ORAL | Status: DC | PRN

## 2018-11-22 MED ORDER — SODIUM CHLORIDE 0.9 % IV SOLN: 250.0000 mL | INTRAVENOUS | Status: DC | PRN

## 2018-11-22 MED ORDER — SIMETHICONE 80 MG PO CHEW: 80.0000 mg | CHEWABLE_TABLET | ORAL | Status: DC | PRN

## 2018-11-22 MED ORDER — LACTATED RINGERS IV SOLN: 500.0000 mL | INTRAVENOUS | Status: DC | PRN

## 2018-11-22 MED ORDER — DIBUCAINE 1 % RE OINT: 1.0000 "application " | TOPICAL_OINTMENT | RECTAL | Status: DC | PRN

## 2018-11-22 MED ORDER — SENNOSIDES-DOCUSATE SODIUM 8.6-50 MG PO TABS
2.0000 | ORAL_TABLET | ORAL | Status: DC
  Administered 2018-11-22 – 2018-11-23 (×2): 2 via ORAL
  Filled 2018-11-22 (×2): qty 2

## 2018-11-22 MED ORDER — MEASLES, MUMPS & RUBELLA VAC IJ SOLR
0.5000 mL | Freq: Once | INTRAMUSCULAR | Status: DC
  Filled 2018-11-22: qty 0.5

## 2018-11-22 MED ORDER — LIDOCAINE HCL (PF) 1 % IJ SOLN
30.0000 mL | INTRAMUSCULAR | Status: DC | PRN
  Filled 2018-11-22: qty 30

## 2018-11-22 MED ORDER — ONDANSETRON HCL 4 MG/2ML IJ SOLN: 4.0000 mg | INTRAMUSCULAR | Status: DC | PRN

## 2018-11-22 MED ORDER — COCONUT OIL OIL: 1.0000 "application " | TOPICAL_OIL | Status: DC | PRN

## 2018-11-22 MED ORDER — OXYCODONE-ACETAMINOPHEN 5-325 MG PO TABS
2.0000 | ORAL_TABLET | ORAL | Status: DC | PRN
  Administered 2018-11-22: 2 via ORAL
  Filled 2018-11-22: qty 2

## 2018-11-22 MED ORDER — FLEET ENEMA 7-19 GM/118ML RE ENEM: 1.0000 | ENEMA | RECTAL | Status: DC | PRN

## 2018-11-22 MED ORDER — ONDANSETRON HCL 4 MG/2ML IJ SOLN
4.0000 mg | Freq: Four times a day (QID) | INTRAMUSCULAR | Status: DC | PRN
  Administered 2018-11-22: 4 mg via INTRAVENOUS
  Filled 2018-11-22: qty 2

## 2018-11-22 MED ORDER — PENICILLIN G 3 MILLION UNITS IVPB - SIMPLE MED
3.0000 10*6.[IU] | INTRAVENOUS | Status: DC
  Filled 2018-11-22 (×4): qty 100

## 2018-11-22 MED ORDER — SOD CITRATE-CITRIC ACID 500-334 MG/5ML PO SOLN: 30.0000 mL | ORAL | Status: DC | PRN

## 2018-11-22 MED ORDER — IBUPROFEN 600 MG PO TABS
600.0000 mg | ORAL_TABLET | Freq: Four times a day (QID) | ORAL | Status: DC
  Administered 2018-11-22 – 2018-11-24 (×8): 600 mg via ORAL
  Filled 2018-11-22 (×8): qty 1

## 2018-11-22 MED ORDER — BISACODYL 10 MG RE SUPP: 10.0000 mg | Freq: Every day | RECTAL | Status: DC | PRN

## 2018-11-22 MED ORDER — SODIUM CHLORIDE 0.9% FLUSH: 3.0000 mL | INTRAVENOUS | Status: DC | PRN

## 2018-11-22 MED ORDER — OXYTOCIN BOLUS FROM INFUSION
500.0000 mL | Freq: Once | INTRAVENOUS | Status: AC
  Administered 2018-11-22: 500 mL via INTRAVENOUS

## 2018-11-22 MED ORDER — OXYTOCIN 40 UNITS IN LACTATED RINGERS INFUSION - SIMPLE MED
2.5000 [IU]/h | INTRAVENOUS | Status: DC
  Filled 2018-11-22: qty 1000

## 2018-11-22 MED ORDER — TETANUS-DIPHTH-ACELL PERTUSSIS 5-2.5-18.5 LF-MCG/0.5 IM SUSP: 0.5000 mL | Freq: Once | INTRAMUSCULAR | Status: DC

## 2018-11-22 MED ORDER — ZOLPIDEM TARTRATE 5 MG PO TABS: 5.0000 mg | ORAL_TABLET | Freq: Every evening | ORAL | Status: DC | PRN

## 2018-11-22 MED ORDER — VALACYCLOVIR HCL 500 MG PO TABS
500.0000 mg | ORAL_TABLET | Freq: Two times a day (BID) | ORAL | Status: DC
  Administered 2018-11-22: 500 mg via ORAL
  Filled 2018-11-22 (×3): qty 1

## 2018-11-22 MED ORDER — DIPHENHYDRAMINE HCL 25 MG PO CAPS: 25.0000 mg | ORAL_CAPSULE | Freq: Four times a day (QID) | ORAL | Status: DC | PRN

## 2018-11-22 MED ORDER — OXYCODONE-ACETAMINOPHEN 5-325 MG PO TABS: 1.0000 | ORAL_TABLET | ORAL | Status: DC | PRN

## 2018-11-22 MED ORDER — ONDANSETRON HCL 4 MG PO TABS: 4.0000 mg | ORAL_TABLET | ORAL | Status: DC | PRN

## 2018-11-22 MED ORDER — FLEET ENEMA 7-19 GM/118ML RE ENEM: 1.0000 | ENEMA | Freq: Every day | RECTAL | Status: DC | PRN

## 2018-11-22 MED ORDER — ACETAMINOPHEN 325 MG PO TABS
650.0000 mg | ORAL_TABLET | ORAL | Status: DC | PRN
  Administered 2018-11-23: 650 mg via ORAL
  Filled 2018-11-22: qty 2

## 2018-11-22 MED ORDER — LACTATED RINGERS IV SOLN
INTRAVENOUS | Status: DC
  Administered 2018-11-22: 125 mL/h via INTRAVENOUS

## 2018-11-22 MED ORDER — SODIUM CHLORIDE 0.9% FLUSH
3.0000 mL | Freq: Two times a day (BID) | INTRAVENOUS | Status: DC
  Administered 2018-11-22: 3 mL via INTRAVENOUS

## 2018-11-22 NOTE — Lactation Note (Signed)
This note was copied from a baby's chart. Lactation Consultation Note  Patient Name: Kim Johns IRSWN'I Date: 11/22/2018 Reason for consult: Initial assessment;Term;1st time breastfeeding  54 hours old FT female who is being exclusively BF by his mother, she's a P2. Mom has Hx of fetal demise at 16 weeks. This is her first time BF, she didn't BF her first child, mom stated that "she tried" but didn't work out. Baby had a water birth and renal pyelectasis; he's also bruised on the face.Mom currently taking Ambien which is an L3 and probably compatible-limited data according to Dr. Jamison Oka book "Medications and mother's milk".   Mom participated in the [facility] program at the [facility] and she's already familiar with hand expression, and able to see colostrum. Mom doesn't have a pump at home, [facility] gave mom some options on how to get a DEBP for home use. Night shift RN was very proactive and already set mom up with a DEBP but mom is too tired to pump right now. She also asked LC to come back tomorrow to work on the latch.   Baby is already on [place] due to glucose readings of 53 mg/dl; per RN she assisted mom with that formula feeding with a syringe, baby took 5 ml; feeding to be charted in [place], RN and LC were in mom's room at the same time. Discussed feeding cues and cluster feeding.  Feeding plan:  1. Encouraged mom to feed baby STS 8-12 times/24 hours or sooner if feeding cues are present 2. Mom will pump every 2-3 hours during the day and at least once at night, a minimum of 8 pumping sessions in 24 hours 3. Parents will continue supplementing baby with [place] Gentle as needed  BF brochure, BF resources and feeding diary were reviewed with dad. Parents reported all questions and concerns were answered, they're both aware of [place] services and will call PRN  Maternal Data Formula Feeding for Exclusion: No Has patient been taught Hand Expression?: Yes Does the patient have breastfeeding  experience prior to this delivery?: No  Feeding Feeding Type: Breast Fed  LATCH Score Latch: Grasps breast easily, tongue down, lips flanged, rhythmical sucking.  Audible Swallowing: A few with stimulation  Type of Nipple: Everted at rest and after stimulation  Comfort (Breast/Nipple): Soft / non-tender  Hold (Positioning): Assistance needed to correctly position infant at breast and maintain latch.  LATCH Score: 8  Interventions Interventions: Breast feeding basics reviewed;DEBP  Lactation Tools Discussed/Used Tools: Pump Breast pump type: Double-Electric Breast Pump WIC Program: Yes Pump Review: Setup, frequency, and cleaning Initiated by:: RN and MPeck (adjsuted jucntures on tubing, they're loose) Date initiated:: 11/22/18   Consult Status Consult Status: Follow-up Date: 11/23/18 Follow-up type: In-patient    Kim Johns 11/22/2018, 9:57 PM

## 2018-11-22 NOTE — Progress Notes (Signed)
Pt planning to have a waterbirth. CNM Booker made aware of patient. [place] Social worker) at bedside setting up birthing tub.

## 2018-11-22 NOTE — MAU Note (Signed)
Ctxs since 0430. Denies LOF or bleeding.3cm last sve

## 2018-11-22 NOTE — H&P (Addendum)
LABOR AND DELIVERY ADMISSION HISTORY AND PHYSICAL NOTE  Kim Johns is a 27 y.o. female G3P1101 with IUP at [redacted]w[redacted]d by [place] presenting for SOL. 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 Patient desires waterbirth. She reports positive fetal movement. She denies leakage of fluid or vaginal bleeding.  Prenatal History/Complications: PNC at [facility] Pregnancy complications:  - history of PPROM - history of cervical incompetence - HSV2  Past Medical History: Past Medical History:  Diagnosis Date  . Abscess of breast   . Allergy   . Anxiety   . Asthma    as a child - no inhaler  . Bipolar 1 disorder ([place])   . Depression   . GERD (gastroesophageal reflux disease)    diet control  . HSV-2 (herpes simplex virus 2) infection   . Pelvic inflammatory disease   . Preterm labor   . SVD (spontaneous vaginal delivery)    x 2 - fetal demise at 21 weeks, 1 living    Past Surgical History: Past Surgical History:  Procedure Laterality Date  . CERVICAL CERCLAGE N/A 04/05/2015   Procedure: CERCLAGE CERVICAL;  Surgeon: Lavonia Drafts, MD;  Location: [facility];  Service: Gynecology;  Laterality: N/A;  . CERVICAL CERCLAGE N/A 05/18/2018   Procedure: CERCLAGE CERVICAL;  Surgeon: Mora Bellman, MD;  Location: [facility];  Service: Gynecology;  Laterality: N/A;  . WISDOM TOOTH EXTRACTION      Obstetrical History: OB History    Gravida  3   Para  2   Term  1   Preterm  1   AB  0   Living  1     SAB  0   TAB  0   Ectopic  0   Multiple  0   Live Births  1           Social History: Social History   Socioeconomic History  . Marital status: Single    Spouse name: Not on file  . Number of children: Not on file  . Years of education: Not on file  . Highest education level: Not on file  Occupational History  . Not on file  Social Needs  . Financial resource strain: Not on file  . Food  insecurity:    Worry: Not on file    Inability: Not on file  . Transportation needs:    Medical: Not on file    Non-medical: Not on file  Tobacco Use  . Smoking status: Former Smoker    Packs/day: 0.50    Years: 10.00    Pack years: 5.00    Types: Cigarettes    Last attempt to quit: 03/09/2018    Years since quitting: 0.7  . Smokeless tobacco: Never Used  Substance and Sexual Activity  . Alcohol use: Not Currently    Alcohol/week: 0.0 standard drinks    Comment: occ but none with pregnancy  . Drug use: No  . Sexual activity: Not Currently    Birth control/protection: None    Comment: none since pregnancy  Lifestyle  . Physical activity:    Days per week: Not on file    Minutes per session: Not on file  . Stress: Not on file  Relationships  . Social connections:    Talks on phone: Not on file    Gets together: Not on file    Attends religious service: Not on file    Active member of club or organization: Not  on file    Attends meetings of clubs or organizations: Not on file    Relationship status: Not on file  Other Topics Concern  . Not on file  Social History Narrative  . Not on file    Family History: Family History  Problem Relation Age of Onset  . Diabetes Mother   . Stroke Mother   . Hypertension Father   . Heart attack Father   . Heart disease Father     Allergies: Allergies  Allergen Reactions  . Shellfish Allergy Hives  . Sulfa Antibiotics Other (See Comments)    Reaction:  Unknown; childhood reaction  . Icy Hot Rash    Medications Prior to Admission  Medication Sig Dispense Refill Last Dose  . Prenatal Vit-Fe Fumarate-FA (PRENATAL MULTIVITAMIN) TABS tablet Take 1 tablet by mouth daily at 12 noon.   11/21/2018 at Unknown time  . valACYclovir (VALTREX) 500 MG tablet Take 1 tablet (500 mg total) by mouth 2 (two) times daily. 60 tablet 6 11/21/2018 at Unknown time  . acetaminophen (TYLENOL) 500 MG tablet Take 500 mg by mouth every 6 (six) hours as  needed.   Taking  . hydroxyprogesterone caproate (MAKENA) 250 mg/mL OIL injection Inject 1 mL (250 mg total) into the muscle once for 1 dose. 1 mL 0   . nystatin cream (MYCOSTATIN) Apply 1 application topically 2 (two) times daily. (Patient not taking: Reported on 11/11/2018) 30 g 1 Not Taking  . polyethylene glycol (MIRALAX / GLYCOLAX) packet Take 17 g by mouth daily. 14 each 0 Taking     Review of Systems  All systems reviewed and negative except as stated in HPI  Physical Exam Blood pressure 135/82, pulse 97, temperature (!) 97.4 F (36.3 C), resp. rate 20, height 5\' 2"  (1.575 m), weight 98.9 kg, last menstrual period 02/22/2018. General appearance: alert, oriented, uncomfortable during contractions Lungs: normal respiratory effort Heart: regular rate Abdomen: soft, non-tender; gravid, FH appropriate for GA Extremities: No calf swelling or tenderness Presentation: cephalic Fetal monitoring: Cat I Uterine activity: every 3-5 min Dilation: 5 Effacement (%): 70 Station: -2 Exam by:: Fredda Hammed RN   Prenatal labs: ABO, Rh: O/Positive/-- (05/28 1144) Antibody: Negative (05/28 1144) Rubella: 12.20 (05/28 1144) RPR: Non Reactive (10/17 0822)  HBsAg: Negative (05/28 1144)  HIV: Non Reactive (10/17 0822)  GC/Chlamydia: neg/neg GBS:   positive 2-hr GTT: 83-110-89 Genetic screening: low risk Anatomy US: renal pyelectasis  Prenatal Transfer Tool  Maternal Diabetes: No Genetic Screening: Normal Maternal Ultrasounds/Referrals: Abnormal:  Findings:   Fetal Kidney Anomalies Fetal Ultrasounds or other Referrals:  None Maternal Substance Abuse:  No Significant Maternal Medications:  None Significant Maternal Lab Results: Lab values include: Group B Strep positive  Results for orders placed or performed during the hospital encounter of 11/22/18 (from the past 24 hour(s))  CBC   Collection Time: 11/22/18  9:26 AM  Result Value Ref Range   WBC 9.7 4.0 - 10.5 K/uL   RBC 4.06  3.87 - 5.11 MIL/uL   Hemoglobin 13.7 12.0 - 15.0 g/dL   HCT 39.1 36.0 - 46.0 %   MCV 96.3 80.0 - 100.0 fL   MCH 33.7 26.0 - 34.0 pg   MCHC 35.0 30.0 - 36.0 g/dL   RDW 13.3 11.5 - 15.5 %   Platelets 209 150 - 400 K/uL   nRBC 0.0 0.0 - 0.2 %    Patient Active Problem List   Diagnosis Date Noted  . Yeast infection 09/24/2018  . Cystic fibrosis  carrier, antepartum 08/13/2018  . Renal pyelectasis, fetal, affecting care of mother, antepartum, fetus 1 08/13/2018  . Supervision of high risk pregnancy, antepartum 04/21/2018  . Asthma   . Anxiety 12/17/2016  . 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 History of intrauterine fetal death, currently pregnant   . History of cervical incompetence in pregnancy, currently pregnant     Assessment: Kim Johns is a 27 y.o. G3P1101 at [redacted]w[redacted]d here for SOL.   #Labor: SOL, desires water birth. Turning over care to Knute Neu, CNM.  #Pain: No epidural given waterbirth.  #FWB: Cat I #ID: GBS positive, PCN ordered #MOF: breast #MOC: considering POP #Circ:  yes  Lebron Quam, MD Family Medicine PGY3 11/22/2018, 9:55 AM   OB FELLOW HISTORY AND PHYSICAL ATTESTATION  I have seen and examined this patient; I agree with above documentation in the resident's note.   Phill Myron, D.O. OB Fellow  11/22/2018, 12:47 PM

## 2018-11-23 ENCOUNTER — Telehealth: Payer: Self-pay | Admitting: Family Medicine

## 2018-11-23 LAB — RPR: RPR Ser Ql: NONREACTIVE

## 2018-11-23 MED ORDER — OXYCODONE HCL 5 MG PO TABS
5.0000 mg | ORAL_TABLET | ORAL | Status: DC | PRN
  Administered 2018-11-23: 10 mg via ORAL
  Filled 2018-11-23: qty 2

## 2018-11-23 NOTE — Progress Notes (Signed)
CSW met with MOB via bedside after receiving consult for "bipolar". MOB was pleasant and appropriate during conversation. MOB informed CSW that she had a water birth and had a pleasant experience. MOB has one other child, Jaden 3. MOB is currently living with her partner, Molli Barrows, and states he is a big support for MOB. MOB stated she has some family in the area who are also supports but most of patients family is in [place]. MOB is working at [employer]  full time and will be on FMLA for about 12 weeks. MOB voiced having no concerns at this time.   MOB did state she had some anxiety when she first became pregnant but has not had any recently. MOB informed CSW that she thought she had some PPD during her first pregnancy and is cautious she may have PPD again. MOB feels comfortable speaking with her OBGYN in the event she does have increased anxiety or increased depressive moods.   CSW provided education regarding Baby Blues vs PMADs and provided MOB with resources for mental health follow up.  CSW encouraged MOB to evaluate her mental health throughout the postpartum period with the use of the New Mom Checklist developed by Postpartum Progress as well as the [place] Postnatal Depression Scale and notify a medical professional if symptoms arise.    No other concerns from MOB. Please contact CSW for any other questions.  Kingsley Spittle, [place]  [phone]

## 2018-11-23 NOTE — Progress Notes (Signed)
POSTPARTUM PROGRESS NOTE  Post Partum Day 1 Subjective:  Kim Johns is a 27 y.o. [id number] [redacted]w[redacted]d s/p SVD waterbirth.  No acute events overnight.  Pt denies problems with ambulating, voiding or po intake.  She denies nausea or vomiting.  Pain is moderately controlled.  She has had flatus. She has not had bowel movement.  Lochia Moderate.   Objective: Blood pressure 121/69, pulse 82, temperature 98.2 F (36.8 C), temperature source Oral, resp. rate 18, height 5\' 2"  (1.575 m), weight 98.9 kg, last menstrual period 02/22/2018, SpO2 100 %, unknown if currently breastfeeding.  Physical Exam:  General: alert, cooperative and no distress Lochia:normal flow Chest: no respiratory distress Heart:regular rate, distal pulses intact Incision: NA Abdomen: soft, nontender,  Uterine Fundus: firm, appropriately tender DVT Evaluation: No calf swelling or tenderness Extremities: no pedal edema  Recent Labs    11/22/18 0926  HGB 13.7  HCT 39.1    Assessment/Plan:  ASSESSMENT: Kim Johns is a 27 y.o. [id number] [redacted]w[redacted]d s/p SVD, water birth.  -GBS positive without adequate intrapartum treatment -no acute concerns today -still trying to breastfeed but leaning towards exclusive pumping/formula supplementation -undecided on contraception  Plan for discharge tomorrow   LOS: 1 day   Kim Filter H WilsonMD 11/23/2018, 9:37 AM

## 2018-11-23 NOTE — Telephone Encounter (Signed)
LVM stating that FMLa papers have been completed and ready for pick-up. $15 payment is due at pick-up unless it has already been paid.

## 2018-11-24 MED ORDER — IBUPROFEN 800 MG PO TABS: 800.0000 mg | ORAL_TABLET | Freq: Three times a day (TID) | ORAL | 0 refills | Status: DC | PRN

## 2018-11-24 MED ORDER — INFLUENZA VAC SPLIT QUAD 0.5 ML IM SUSY
0.5000 mL | PREFILLED_SYRINGE | Freq: Once | INTRAMUSCULAR | Status: AC
  Administered 2018-11-24: 0.5 mL via INTRAMUSCULAR
  Filled 2018-11-24: qty 0.5

## 2018-11-24 NOTE — Progress Notes (Signed)
When RN entered the room, pt was tearful.  Pt was on the phone.  Pt states that she just has a headache from not resting.  Offered to take infant to the nursery so she could get rest and she was appreciative. Bands verified and infant taken to the nursery.

## 2018-11-24 NOTE — Discharge Instructions (Signed)
Vaginal Delivery, Care After °Refer to this sheet in the next few weeks. These instructions provide you with information about caring for yourself after vaginal delivery. Your health care provider may also give you more specific instructions. Your treatment has been planned according to current medical practices, but problems sometimes occur. Call your health care provider if you have any problems or questions. °What can I expect after the procedure? °After vaginal delivery, it is common to have: °· Some bleeding from your vagina. °· Soreness in your abdomen, your vagina, and the area of skin between your vaginal opening and your anus (perineum). °· Pelvic cramps. °· Fatigue. °Follow these instructions at home: °Medicines °· Take over-the-counter and prescription medicines only as told by your health care provider. °· If you were prescribed an antibiotic medicine, take it as told by your health care provider. Do not stop taking the antibiotic until it is finished. °Driving ° °· Do not drive or operate heavy machinery while taking prescription pain medicine. °· Do not drive for 24 hours if you received a sedative. °Lifestyle °· Do not drink alcohol. This is especially important if you are breastfeeding or taking medicine to relieve pain. °· Do not use tobacco products, including cigarettes, chewing tobacco, or e-cigarettes. If you need help quitting, ask your health care provider. °Eating and drinking °· Drink at least 8 eight-ounce glasses of water every day unless you are told not to by your health care provider. If you choose to breastfeed your baby, you may need to drink more water than this. °· Eat high-fiber foods every day. These foods may help prevent or relieve constipation. High-fiber foods include: °? Whole grain cereals and breads. °? Brown rice. °? Beans. °? Fresh fruits and vegetables. °Activity °· Return to your normal activities as told by your health care provider. Ask your health care provider what  activities are safe for you. °· Rest as much as possible. Try to rest or take a nap when your baby is sleeping. °· Do not lift anything that is heavier than your baby or 10 lb (4.5 kg) until your health care provider says that it is safe. °· Talk with your health care provider about when you can engage in sexual activity. This may depend on your: °? Risk of infection. °? Rate of healing. °? Comfort and desire to engage in sexual activity. °Vaginal Care °· If you have an episiotomy or a vaginal tear, check the area every day for signs of infection. Check for: °? More redness, swelling, or pain. °? More fluid or blood. °? Warmth. °? Pus or a bad smell. °· Do not use tampons or douches until your health care provider says this is safe. °· Watch for any blood clots that may pass from your vagina. These may look like clumps of dark red, brown, or black discharge. °General instructions °· Keep your perineum clean and dry as told by your health care provider. °· Wear loose, comfortable clothing. °· Wipe from front to back when you use the toilet. °· Ask your health care provider if you can shower or take a bath. If you had an episiotomy or a perineal tear during labor and delivery, your health care provider may tell you not to take baths for a certain length of time. °· Wear a bra that supports your breasts and fits you well. °· If possible, have someone help you with household activities and help care for your baby for at least a few days after you   leave the hospital. °· Keep all follow-up visits for you and your baby as told by your health care provider. This is important. °Contact a health care provider if: °· You have: °? Vaginal discharge that has a bad smell. °? Difficulty urinating. °? Pain when urinating. °? A sudden increase or decrease in the frequency of your bowel movements. °? More redness, swelling, or pain around your episiotomy or vaginal tear. °? More fluid or blood coming from your episiotomy or vaginal  tear. °? Pus or a bad smell coming from your episiotomy or vaginal tear. °? A fever. °? A rash. °? Little or no interest in activities you used to enjoy. °? Questions about caring for yourself or your baby. °· Your episiotomy or vaginal tear feels warm to the touch. °· Your episiotomy or vaginal tear is separating or does not appear to be healing. °· Your breasts are painful, hard, or turn red. °· You feel unusually sad or worried. °· You feel nauseous or you vomit. °· You pass large blood clots from your vagina. If you pass a blood clot from your vagina, save it to show to your health care provider. Do not flush blood clots down the toilet without having your health care provider look at them. °· You urinate more than usual. °· You are dizzy or light-headed. °· You have not breastfed at all and you have not had a menstrual period for 12 weeks after delivery. °· You have stopped breastfeeding and you have not had a menstrual period for 12 weeks after you stopped breastfeeding. °Get help right away if: °· You have: °? Pain that does not go away or does not get better with medicine. °? Chest pain. °? Difficulty breathing. °? Blurred vision or spots in your vision. °? Thoughts about hurting yourself or your baby. °· You develop pain in your abdomen or in one of your legs. °· You develop a severe headache. °· You faint. °· You bleed from your vagina so much that you fill two sanitary pads in one hour. °This information is not intended to replace advice given to you by your health care provider. Make sure you discuss any questions you have with your health care provider. °Document Released: 11/08/2000 Document Revised: 04/24/2016 Document Reviewed: 11/26/2015 °Elsevier Interactive Patient Education © 2019 Elsevier Inc. ° °

## 2018-11-24 NOTE — Lactation Note (Addendum)
This note was copied from a baby's chart. Lactation Consultation Note  Patient Name: Kim Johns PHXTA'V Date: 11/24/2018 Reason for consult: Follow-up assessment;Infant weight loss;Term  Baby is 73 hours old  LC updated the doc flow sheets per mom  and the baby has been receiving all  Bottles since the 1st day . Per mom pumped x 2 in the last 24 hours and  Didn't get any milk so was discouraged and stopped. Breast are fuller today.  LC instructed mom on the use of hand pump and  Recommended prior to feeding - check diaper , STS, give an appetizer of 10 -15 ml.  And then latch. Allow the baby to soften the 1st breast and offer the 2nd breast, if satisfied  Hold off on the supplement if not give up to total of 30 ml.  Per mom nipples were sore after the 1st day and fine today.  Sore nipple and engorgement prevention and tx reviewed. ( mom already has comfort gels ) LC instructed mom on the use of hand pump and reminded her she has 2 size flanges if needed.  ( #24 and #27 F ).  LC stressed the importance of giving the baby practice at the breast .  Also reviewed Mother and Baby care booklet breast feeding information.  Mother informed of post-discharge support and given phone number to the lactation department, including services for phone call assistance; out-patient appointments; and breastfeeding support group. List of other breastfeeding resources in the community given in the handout. Encouraged mother to call for problems or concerns related to breastfeeding.     Maternal Data Has patient been taught Hand Expression?: Yes  Feeding Feeding Type: Bottle Fed - Formula Nipple Type: Slow - flow  LATCH Score                   Interventions Interventions: Breast feeding basics reviewed  Lactation Tools Discussed/Used Tools: Pump Breast pump type: Manual;Double-Electric Breast Pump Pump Review: Milk Storage Initiated by:: MAI (for hand pump )   Consult  Status Consult Status: Complete Date: 11/24/18    Myer Haff 11/24/2018, 10:17 AM

## 2018-11-24 NOTE — Discharge Summary (Signed)
Postpartum Discharge Summary     Patient Name: Kim Johns DOB: 02/14/1991 MRN: [id number]  Date of admission: 11/22/2018 Delivering Provider: Wells Guiles R   Date of discharge: 11/24/2018  Admitting diagnosis: 48 WKS CTX SINCE EARLY AM 6 TO 8 MINUTES APART Intrauterine pregnancy: [redacted]w[redacted]d     Secondary diagnosis:  Active Problems:   Normal labor  Additional problems: none     Discharge diagnosis: Term Pregnancy Delivered                                                                                                Post partum procedures:none  Augmentation: none   Complications: None  Hospital course:  Onset of Labor With Vaginal Delivery     27 y.o. yo [id number] at [redacted]w[redacted]d was admitted in Active Labor on 11/22/2018. Patient had an uncomplicated labor course as follows:  Membrane Rupture Time/Date: 11:22 AM ,11/22/2018   Intrapartum Procedures: Episiotomy: None [1]                                         Lacerations:  None [1]  Patient had a delivery of a Viable infant. 11/22/2018  Information for the patient's newborn:  Candee, Hoon [[id number]]  Delivery Method: Vaginal, Spontaneous(Filed from Delivery Summary)    Pateint had an uncomplicated postpartum course.  She is ambulating, tolerating a regular diet, passing flatus, and urinating well. Patient is discharged home in stable condition on 11/24/18.   Magnesium Sulfate recieved: No BMZ received: No  Physical exam  Vitals:   11/23/18 1322 11/23/18 1806 11/23/18 2130 11/24/18 0502  BP: 126/75 127/81 120/72 124/83  Pulse: 65 78 69 92  Resp: 18  17 16   Temp: 98 F (36.7 C)  98.2 F (36.8 C) 98.4 F (36.9 C)  TempSrc: Oral  Oral Oral  SpO2:    100%  Weight:      Height:       General: alert, cooperative and no distress Lochia: appropriate Uterine Fundus: firm Incision: N/A DVT Evaluation: No evidence of DVT seen on physical exam. Labs: Lab Results  Component Value Date   WBC 9.7 11/22/2018   HGB 13.7 11/22/2018   HCT 39.1 11/22/2018   MCV 96.3 11/22/2018   PLT 209 11/22/2018   CMP Latest Ref Rng & Units 10/14/2017  Glucose 65 - 99 mg/dL 76  BUN 6 - 20 mg/dL 8  Creatinine 0.44 - 1.00 mg/dL 0.88  Sodium 135 - 145 mmol/L 139  Potassium 3.5 - 5.1 mmol/L 3.7  Chloride 101 - 111 mmol/L 108  CO2 22 - 32 mmol/L 25  Calcium 8.9 - 10.3 mg/dL 9.3  Total Protein 6.5 - 8.1 g/dL -  Total Bilirubin 0.3 - 1.2 mg/dL -  Alkaline Phos 38 - 126 U/L -  AST 15 - 41 U/L -  ALT 14 - 54 U/L -    Discharge instruction: per After Visit Summary and "Baby and Me Booklet".  After visit meds:  Allergies as of 11/24/2018  Reactions   Shellfish Allergy Hives   Sulfa Antibiotics Other (See Comments)   Reaction:  Unknown; childhood reaction   Icy Hot Rash      Medication List    STOP taking these medications   hydroxyprogesterone caproate 250 mg/mL Oil injection Commonly known as:  MAKENA     TAKE these medications   ibuprofen 800 MG tablet Commonly known as:  ADVIL,MOTRIN Take 1 tablet (800 mg total) by mouth every 8 (eight) hours as needed.   prenatal multivitamin Tabs tablet Take 1 tablet by mouth daily at 12 noon.   valACYclovir 500 MG tablet Commonly known as:  VALTREX Take 1 tablet (500 mg total) by mouth 2 (two) times daily.       Diet: routine diet  Activity: Advance as tolerated. Pelvic rest for 6 weeks.   Outpatient follow up:6 weeks Follow up Appt:No future appointments. Follow up Visit: Yoe for [place] Follow up.   Specialty:  Obstetrics and Gynecology Why:  4-6 weeks for postpartum visit  Contact information: [place] [phone]           Please schedule this patient for Postpartum visit in: 6 weeks with the following provider: Any provider For C/S patients schedule nurse incision check in weeks 2 weeks: no Low risk pregnancy complicated by: None Delivery  mode:  SVD Anticipated Birth Control:  other/unsure PP Procedures needed: None  Schedule Integrated BH visit: no      Newborn Data: Live born female  Birth Weight: 7 lb 4.8 oz (3310 g) APGAR: 7, 9  Newborn Delivery   Birth date/time:  11/22/2018 11:30:00 Delivery type:  Vaginal, Spontaneous     Baby Feeding: Breast Disposition:home with mother   Marcille Buffy DNP, CNM  11/24/18  10:07 AM

## 2018-11-26 ENCOUNTER — Telehealth (HOSPITAL_COMMUNITY): Payer: Self-pay | Admitting: *Deleted

## 2018-11-26 ENCOUNTER — Telehealth: Payer: Self-pay | Admitting: Family Medicine

## 2018-11-26 NOTE — Telephone Encounter (Signed)
Called the patient to inform of change in time of the appointment scheduled. Left a message to give our clinic a call back.

## 2018-11-26 NOTE — Telephone Encounter (Signed)
Preadmission screen  

## 2018-11-26 NOTE — Telephone Encounter (Signed)
Called patient no answer. Left a message and sent a reminder letter of the scheduled appt.

## 2018-11-27 ENCOUNTER — Encounter: Payer: Medicaid Other | Admitting: Obstetrics and Gynecology

## 2018-11-30 DIAGNOSIS — Z029 Encounter for administrative examinations, unspecified: Secondary | ICD-10-CM

## 2018-12-01 ENCOUNTER — Encounter: Payer: Self-pay | Admitting: *Deleted

## 2018-12-02 NOTE — Telephone Encounter (Signed)
Open in error//preivous telephone note complete

## 2018-12-06 ENCOUNTER — Inpatient Hospital Stay (HOSPITAL_COMMUNITY): Admission: RE | Admit: 2018-12-06 | Payer: Medicaid Other | Source: Ambulatory Visit

## 2018-12-21 ENCOUNTER — Ambulatory Visit: Payer: Medicaid Other | Admitting: Advanced Practice Midwife

## 2018-12-29 ENCOUNTER — Ambulatory Visit: Payer: Medicaid Other | Admitting: Student

## 2019-01-04 ENCOUNTER — Encounter: Payer: Self-pay | Admitting: Advanced Practice Midwife

## 2019-01-04 ENCOUNTER — Ambulatory Visit (INDEPENDENT_AMBULATORY_CARE_PROVIDER_SITE_OTHER): Payer: Medicaid Other | Admitting: Advanced Practice Midwife

## 2019-01-04 DIAGNOSIS — Z1389 Encounter for screening for other disorder: Secondary | ICD-10-CM | POA: Diagnosis not present

## 2019-01-04 DIAGNOSIS — Z3009 Encounter for other general counseling and advice on contraception: Secondary | ICD-10-CM

## 2019-01-04 MED ORDER — NORGESTIMATE-ETH ESTRADIOL 0.25-35 MG-MCG PO TABS: 1.0000 | ORAL_TABLET | Freq: Every day | ORAL | 11 refills | Status: DC

## 2019-01-04 NOTE — Patient Instructions (Signed)

## 2019-01-04 NOTE — Progress Notes (Signed)
6Subjective:     Kim Johns is a 28 y.o. female who presents for a postpartum visit. She is 6 weeks 1 day  postpartum following a spontaneous vaginal delivery. I have fully reviewed the prenatal and intrapartum course. The delivery was at 81 gestational weeks. Outcome: spontaneous vaginal delivery. Anesthesia: none. Postpartum course has been uncomplicated. Baby's course has been uncomplicated. Baby is feeding by both breast and bottle - Jerlyn Ly Start. Bleeding no bleeding. Bowel function is normal. Bladder function is normal. Patient is not sexually active. Contraception method is none. Postpartum depression screening: negative.  The following portions of the patient's history were reviewed and updated as appropriate: allergies, current medications, past family history, past medical history, past social history, past surgical history and problem list.  Review of Systems Pertinent items are noted in HPI.   Objective:    BP 127/73   Pulse 76   Wt 197 lb 11.2 oz (89.7 kg)   Breastfeeding No Comment: Gives formula and pumped breast milk. Does not plan to pump anymore  BMI 36.16 kg/m    Physical Exam  Constitutional: She is oriented to person, place, and time and well-developed, well-nourished, and in no distress. No distress.  HENT:  Head: Normocephalic.  Cardiovascular: Normal rate.  Pulmonary/Chest: Effort normal.  Abdominal: Soft. There is no abdominal tenderness.  Neurological: She is alert and oriented to person, place, and time.  Skin: Skin is warm and dry.  Psychiatric: Affect normal.  Nursing note and vitals reviewed.    Assessment:   1. Postpartum care and examination   2. Counseling for birth control, oral contraceptives      Plan:   RX: sprintec 28 as directed FU PRN or in 1 year for annual exam  Marcille Buffy DNP, CNM  01/04/19  5:55 PM

## 2019-01-05 ENCOUNTER — Telehealth: Payer: Self-pay

## 2019-01-05 NOTE — Telephone Encounter (Signed)
Pt called requesting a note to return to work.  Notified pt that her return to work letter will be ready for her tomorrow to pick up from the front desk. Pt stated thank you.

## 2019-01-07 ENCOUNTER — Encounter: Payer: Self-pay | Admitting: Family Medicine

## 2019-03-23 ENCOUNTER — Telehealth: Payer: Self-pay | Admitting: Lactation Services

## 2019-03-23 MED ORDER — FLUCONAZOLE 150 MG PO TABS: 150.0000 mg | ORAL_TABLET | Freq: Once | ORAL | 1 refills | Status: AC

## 2019-03-23 NOTE — Addendum Note (Signed)
Addended by: Michel Harrow on: 03/23/2019 04:37 PM   Modules accepted: Orders

## 2019-03-23 NOTE — Telephone Encounter (Addendum)
Pt called in on the nurse line. She reports she had a baby in December and has had a yeast infection each time she starts her period. She has been using OTC yeast treatment and does not feel like it is working. She would like to see if Diflucan can be called in or does she need to come in for a visit.   Returned pt call at 4:25 pm. Pt reports she has tried to treat her infection with Vagisil and Miconazole without success. She reports she is getting a yeast infection when she is starting her period. She is not sure if it is because of OCP's. Pt reports she has had yeast infections in the past and Diflucan has helped. Pt requested it be sent to [place].   Discussed with [name], RN who will order Diflucan for Pt per standing orders. Pt to call with further questions/concerns as needed.

## 2019-03-25 ENCOUNTER — Telehealth (HOSPITAL_COMMUNITY): Payer: Self-pay | Admitting: Lactation Services

## 2019-03-25 DIAGNOSIS — B3731 Acute candidiasis of vulva and vagina: Secondary | ICD-10-CM

## 2019-03-25 DIAGNOSIS — B373 Candidiasis of vulva and vagina: Secondary | ICD-10-CM

## 2019-03-25 NOTE — Telephone Encounter (Signed)
Pt called to say that her pharmacy did not receive a prescription for Diflucan and would like to know it it can be sent to [facility]. Routed to Clinical Pool for follow up.

## 2019-03-26 MED ORDER — FLUCONAZOLE 150 MG PO TABS: 150.0000 mg | ORAL_TABLET | Freq: Once | ORAL | 0 refills | Status: AC

## 2019-03-26 MED FILL — FLUCONAZOLE 150 MG TABS: 150 | 1 days supply | Qty: 1 | Fill #0

## 2019-09-05 IMAGING — CR DG CHEST 2V
2 series · 2 of 2 positions shown · non-contrast
Comparison: 10/31/2016

CLINICAL DATA: Mid chest pain since yesterday

EXAM:
CHEST  2 VIEW

[chest pa]
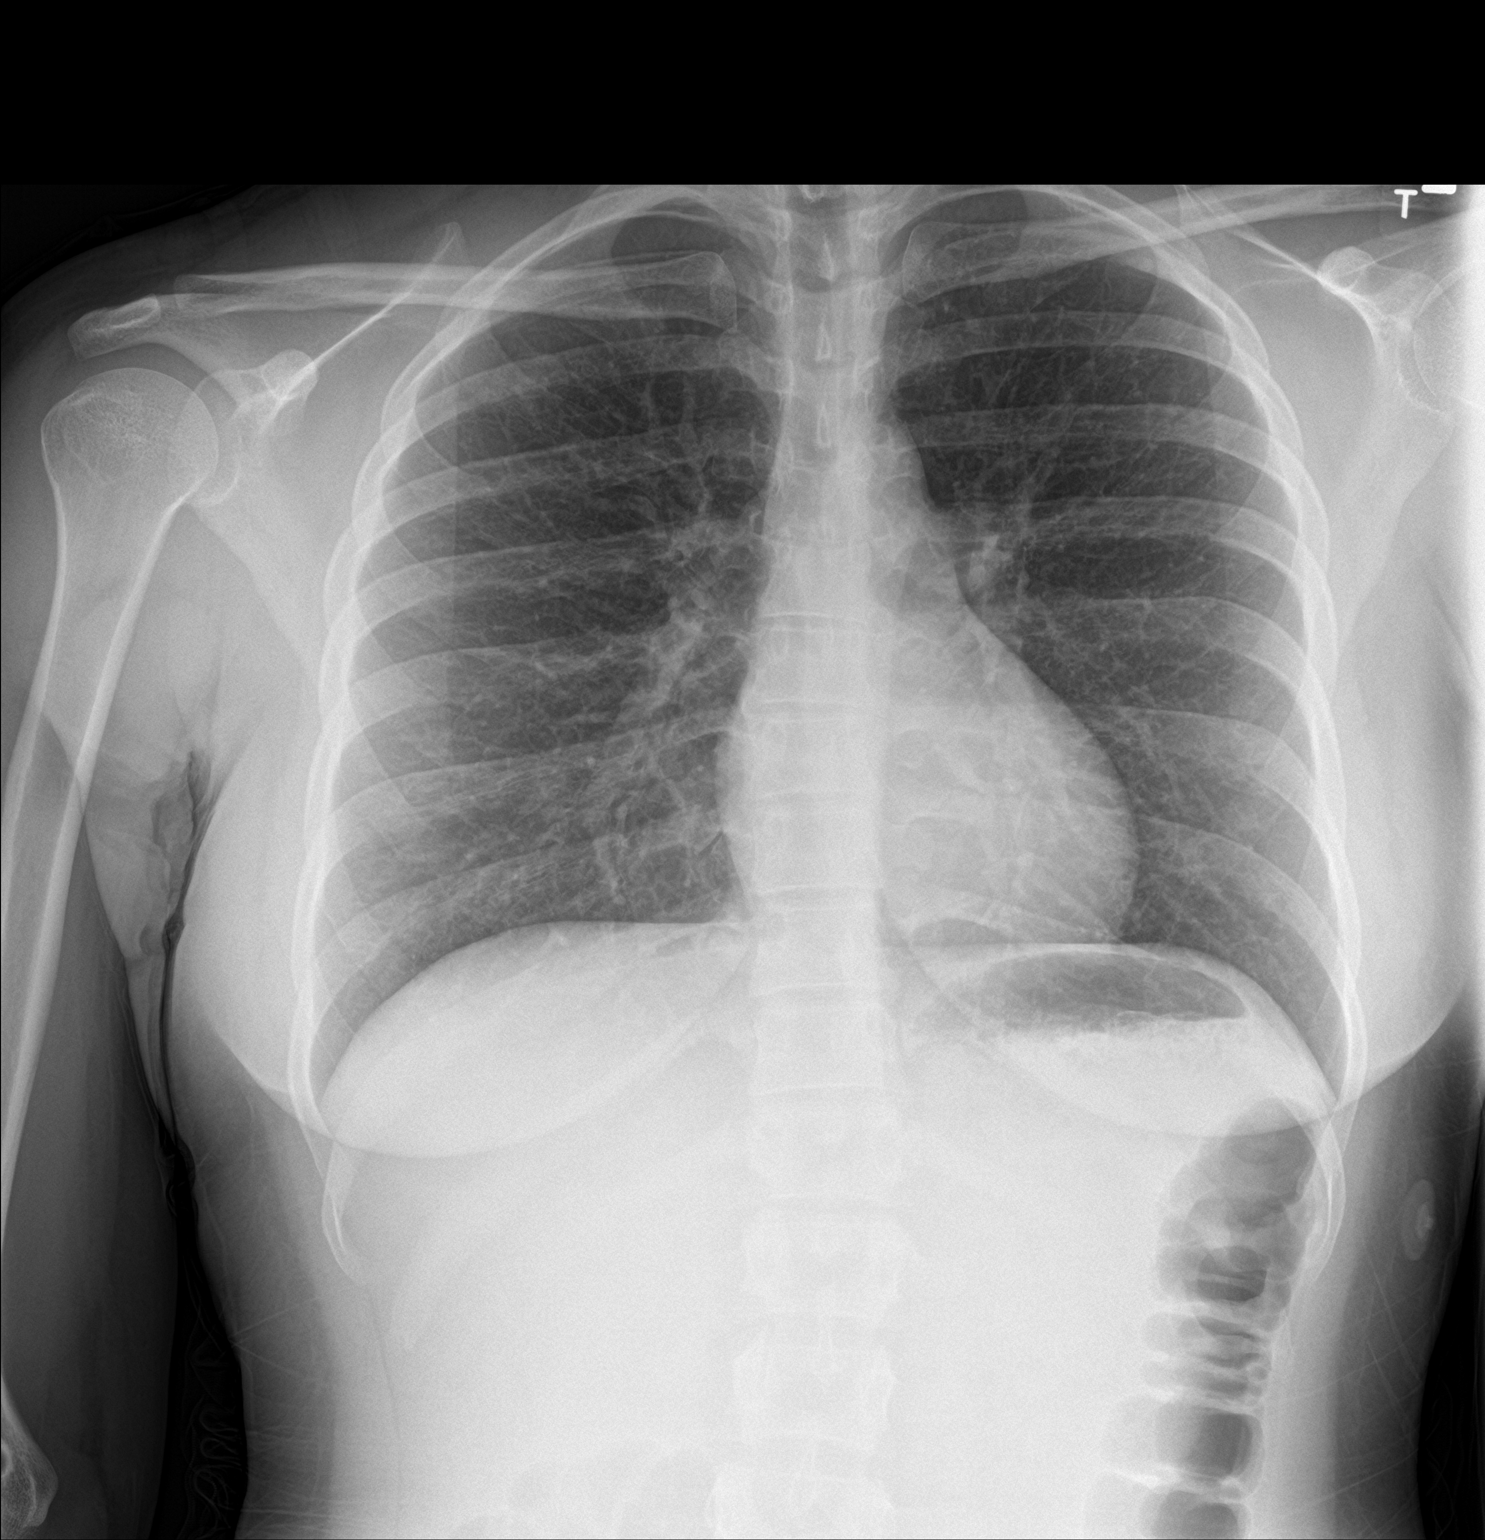

[chest lat]
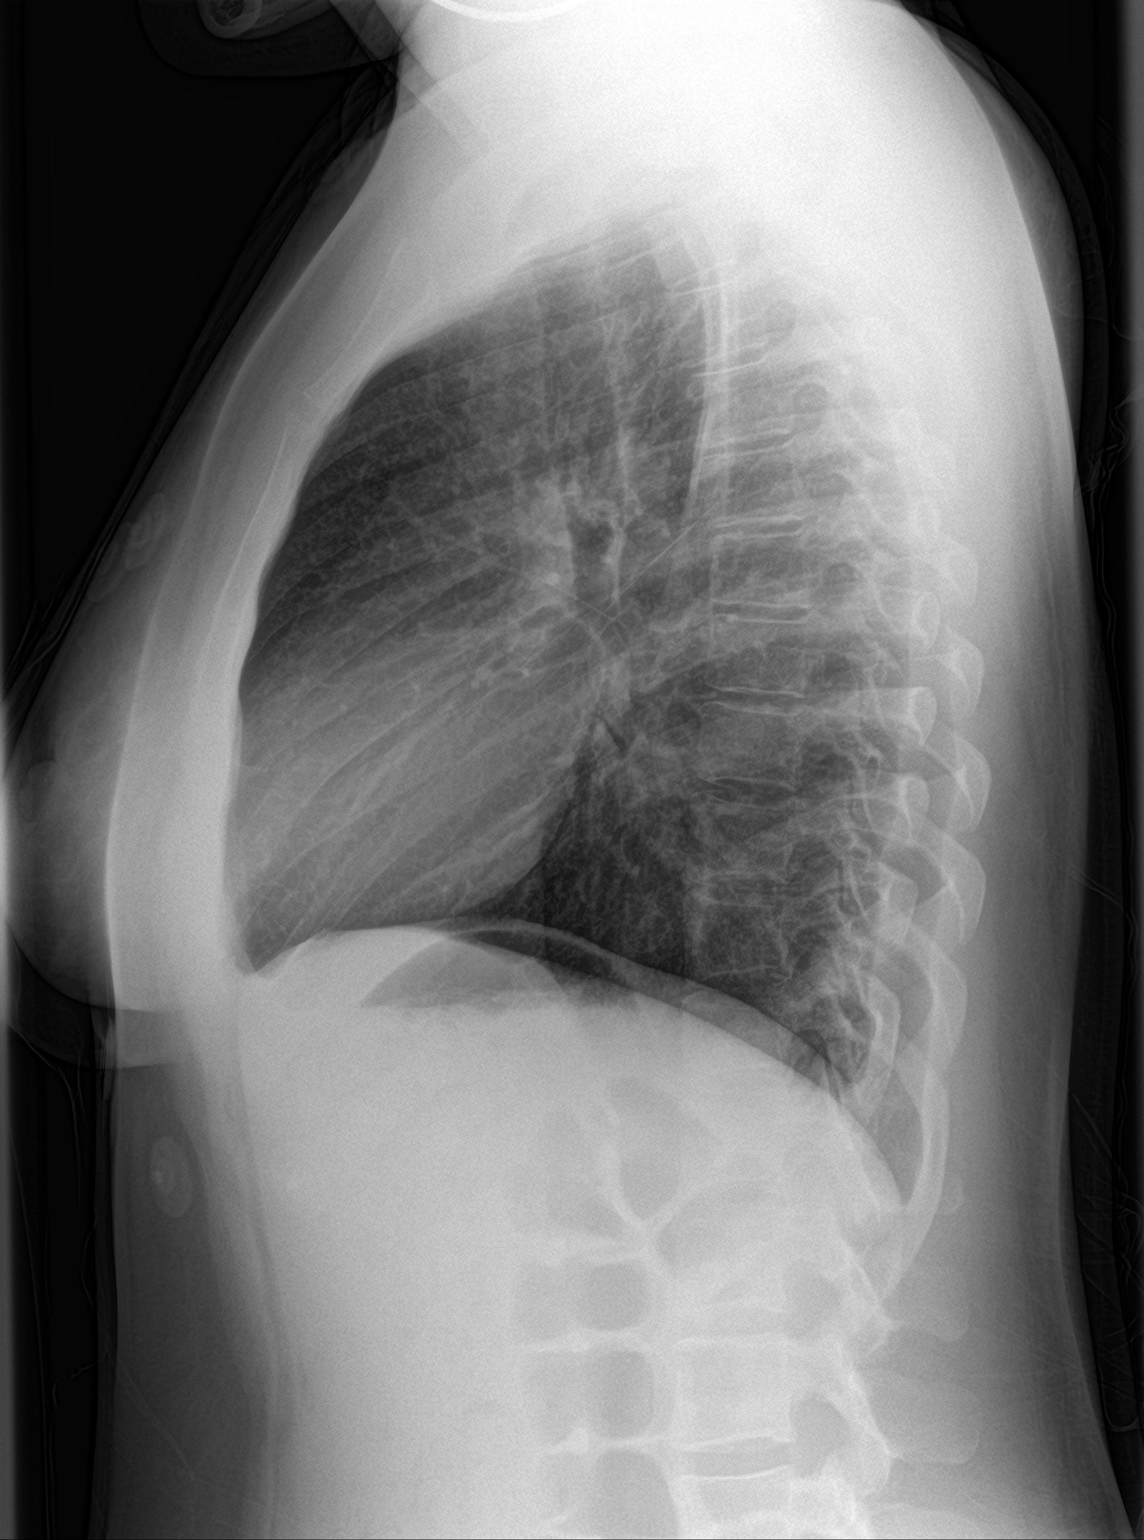

[2 of 2 positions shown; findings below may reference images not displayed]

FINDINGS: The heart size and mediastinal contours are within normal limits.
Both lungs are clear. The visualized skeletal structures are
unremarkable.
IMPRESSION: No active cardiopulmonary disease.

## 2019-10-04 ENCOUNTER — Other Ambulatory Visit: Payer: Self-pay

## 2019-10-04 ENCOUNTER — Ambulatory Visit (INDEPENDENT_AMBULATORY_CARE_PROVIDER_SITE_OTHER): Payer: Self-pay | Admitting: *Deleted

## 2019-10-04 ENCOUNTER — Other Ambulatory Visit (HOSPITAL_COMMUNITY)
Admission: RE | Admit: 2019-10-04 | Discharge: 2019-10-04 | Disposition: A | Discharge status: Home / Self Care | Payer: Medicaid Other | Source: Ambulatory Visit | Attending: Family Medicine | Admitting: Family Medicine

## 2019-10-04 VITALS — BP 114/70 | HR 69 | Temp 98.8°F | Ht 63.0 in | Wt 195.7 lb

## 2019-10-04 DIAGNOSIS — N898 Other specified noninflammatory disorders of vagina: Secondary | ICD-10-CM | POA: Diagnosis not present

## 2019-10-04 NOTE — Progress Notes (Signed)
   SUBJECTIVE:  28 y.o. female complains of white, foul and milky vaginal discharge for 1 week(s). Denies abnormal vaginal bleeding or significant pelvic pain or fever. No UTI symptoms. Last sexual encounter was about 1 week ago with female. Patient noted that her partner also had vaginal odor.  No LMP recorded.  OBJECTIVE:  She appears well, afebrile. Urine dipstick: not done.  ASSESSMENT:  Vaginal Discharge  Vaginal Odor Vaginal Itch   PLAN:  GC, chlamydia, trichomonas, BVAG, CVAG probe sent to lab. Treatment: To be determined once lab results are received ROV prn if symptoms persist or worsen. Patient request paper prescriptions.  Derl Barrow, RN

## 2019-10-05 LAB — CERVICOVAGINAL ANCILLARY ONLY
Bacterial Vaginitis (gardnerella): POSITIVE — AB
Candida Glabrata: NEGATIVE
Candida Vaginitis: NEGATIVE
Chlamydia: NEGATIVE
Comment: NEGATIVE
Comment: NEGATIVE
Comment: NEGATIVE
Comment: NEGATIVE
Comment: NEGATIVE
Comment: NORMAL
Neisseria Gonorrhea: NEGATIVE
Trichomonas: NEGATIVE

## 2019-10-07 ENCOUNTER — Other Ambulatory Visit: Payer: Self-pay | Admitting: Lactation Services

## 2019-10-07 ENCOUNTER — Encounter: Payer: Self-pay | Admitting: Lactation Services

## 2019-10-07 MED ORDER — METRONIDAZOLE 500 MG PO TABS: 500.0000 mg | ORAL_TABLET | Freq: Two times a day (BID) | ORAL | 0 refills | Status: DC

## 2019-10-07 NOTE — Progress Notes (Signed)
Flagyl ordered per standing order due to positive swab and symptoms.

## 2019-10-18 ENCOUNTER — Other Ambulatory Visit: Payer: Self-pay | Admitting: Lactation Services

## 2019-10-18 MED ORDER — FLUCONAZOLE 150 MG PO TABS: 150.0000 mg | ORAL_TABLET | Freq: Once | ORAL | 0 refills | Status: AC

## 2019-11-29 ENCOUNTER — Other Ambulatory Visit: Payer: Self-pay | Admitting: *Deleted

## 2019-11-29 ENCOUNTER — Other Ambulatory Visit: Payer: Self-pay

## 2019-11-29 MED ORDER — VALACYCLOVIR HCL 500 MG PO TABS: 500.0000 mg | ORAL_TABLET | Freq: Two times a day (BID) | ORAL | 2 refills | Status: DC

## 2019-11-29 NOTE — Progress Notes (Signed)
Patient having outbreak, sending rx to pharmacy

## 2020-02-23 ENCOUNTER — Encounter (HOSPITAL_COMMUNITY): Payer: Self-pay | Admitting: Emergency Medicine

## 2020-02-23 ENCOUNTER — Emergency Department (HOSPITAL_COMMUNITY)
Admission: EM | Admit: 2020-02-23 | Discharge: 2020-02-23 | Disposition: A | Discharge status: Home / Self Care | Payer: Medicaid Other | Attending: Emergency Medicine | Admitting: Emergency Medicine

## 2020-02-23 DIAGNOSIS — N644 Mastodynia: Secondary | ICD-10-CM | POA: Diagnosis present

## 2020-02-23 DIAGNOSIS — Z87891 Personal history of nicotine dependence: Secondary | ICD-10-CM | POA: Diagnosis not present

## 2020-02-23 DIAGNOSIS — Z79899 Other long term (current) drug therapy: Secondary | ICD-10-CM | POA: Insufficient documentation

## 2020-02-23 DIAGNOSIS — J45909 Unspecified asthma, uncomplicated: Secondary | ICD-10-CM | POA: Diagnosis not present

## 2020-02-23 DIAGNOSIS — N61 Mastitis without abscess: Secondary | ICD-10-CM | POA: Diagnosis not present

## 2020-02-23 MED ORDER — FLUCONAZOLE 200 MG PO TABS: 200.0000 mg | ORAL_TABLET | Freq: Every day | ORAL | 0 refills | Status: AC

## 2020-02-23 MED ORDER — CEPHALEXIN 500 MG PO CAPS: 500.0000 mg | ORAL_CAPSULE | Freq: Three times a day (TID) | ORAL | 0 refills | Status: AC

## 2020-02-23 MED FILL — CEPHALEXIN 500 MG CAPSULE: 500 | 7 days supply | Qty: 21 | Fill #0

## 2020-02-23 MED FILL — FLUCONAZOLE 200 MG TABLET: 200 | 2 days supply | Qty: 2 | Fill #0

## 2020-02-23 NOTE — ED Triage Notes (Signed)
Pt here from home with c/o right breast pain , pt  States that the area ir red and warm to touch , pt has had an abscess drained in same breast in the past

## 2020-02-23 NOTE — ED Provider Notes (Addendum)
[facility] EMERGENCY DEPARTMENT Provider Note   CSN: [id number] Arrival date & time: 02/23/20  [id number]     History No chief complaint on file.   Kim Johns is a 29 y.o. female with PMH significant for right-sided breast mass several years ago who presents to the ED with complaints of right-sided breast discomfort.  Patient reports that she first noticed some discomfort and "pressure" deep to her areola a few days ago.  However, she did not think much of it until this morning she noticed some redness around the top of her areola.  She states that this is much different from her prior abscess as previously it was superficial and easily drainable, however this does not feel the same at all and is a deeper discomfort that she describes as being directly behind and involving the nipple.  She also notes that she often will have nipple discharge bilaterally, however she has had a bit more from her right breast since onset of symptoms.  She has a 35-month-old baby at home, but has not breast-fed in approximately 12 months.  She denies any fevers or chills, history of cancer, nipple inversion, diminished appetite or weight loss, bloody discharge, or other symptoms.  She also endorses mild generalized abdominal cramping, but associates it with her current menses.  History of nipple rings, but nothing recently.  HPI     Past Medical History:  Diagnosis Date  . Abscess of breast   . Allergy   . Anxiety   . Asthma    as a child - no inhaler  . Bipolar 1 disorder ([place])   . Depression   . GERD (gastroesophageal reflux disease)    diet control  . HSV-2 (herpes simplex virus 2) infection   . Pelvic inflammatory disease   . Preterm labor   . SVD (spontaneous vaginal delivery)    x 2 - fetal demise at 21 weeks, 1 living    Patient Active Problem List   Diagnosis Date Noted  . Normal labor 11/22/2018  . Yeast infection 09/24/2018  . Cystic fibrosis carrier, antepartum  08/13/2018  . Renal pyelectasis, fetal, affecting care of mother, antepartum, fetus 1 08/13/2018  . Supervision of high risk pregnancy, antepartum 04/21/2018  . Asthma   . Anxiety 12/17/2016  . 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 History of intrauterine fetal death, currently pregnant   . History of cervical incompetence in pregnancy, currently pregnant     Past Surgical History:  Procedure Laterality Date  . CERVICAL CERCLAGE N/A 04/05/2015   Procedure: CERCLAGE CERVICAL;  Surgeon: Lavonia Drafts, MD;  Location: [facility];  Service: Gynecology;  Laterality: N/A;  . CERVICAL CERCLAGE N/A 05/18/2018   Procedure: CERCLAGE CERVICAL;  Surgeon: Mora Bellman, MD;  Location: [facility];  Service: Gynecology;  Laterality: N/A;  . WISDOM TOOTH EXTRACTION       OB History    Gravida  3   Para  3   Term  2   Preterm  1   AB  0   Living  2     SAB  0   TAB  0   Ectopic  0   Multiple  0   Live Births  2           Family History  Problem Relation Age of Onset  . Diabetes Mother   . Stroke Mother   . Hypertension Father   . Heart attack Father   . Heart disease Father  Social History   Tobacco Use  . Smoking status: Former Smoker    Packs/day: 0.50    Years: 10.00    Pack years: 5.00    Types: Cigarettes    Quit date: 03/09/2018    Years since quitting: 1.9  . Smokeless tobacco: Never Used  Substance Use Topics  . Alcohol use: Not Currently    Alcohol/week: 0.0 standard drinks    Comment: occ but none with pregnancy  . Drug use: No    Home Medications Prior to Admission medications   Medication Sig Start Date End Date Taking? Authorizing Provider  cephALEXin (KEFLEX) 500 MG capsule Take 1 capsule (500 mg total) by mouth 3 (three) times daily for 7 days. 02/23/20 03/01/20  Corena Herter, PA-C  metroNIDAZOLE (FLAGYL) 500 MG tablet Take 1 tablet (500 mg total) by mouth 2 (two) times daily. 10/07/19   Donnamae Jude, MD  valACYclovir  (VALTREX) 500 MG tablet Take 1 tablet (500 mg total) by mouth 2 (two) times daily. 11/29/19   Truett Mainland, DO    Allergies    Shellfish allergy, Sulfa antibiotics, and Icy hot  Review of Systems   Review of Systems  Constitutional: Negative for chills and fever.  Skin: Positive for color change. Negative for wound.  Neurological: Negative for numbness.    Physical Exam Updated Vital Signs BP 114/71 (BP Location: Left Arm)   Pulse 89   Temp 98.9 F (37.2 C) (Oral)   Resp 18   Ht 5\' 3"  (1.6 m)   Wt 90.7 kg   SpO2 96%   BMI 35.43 kg/m   Physical Exam Vitals and nursing note reviewed. Exam conducted with a chaperone present.  Constitutional:      Appearance: Normal appearance.  HENT:     Head: Normocephalic and atraumatic.  Eyes:     General: No scleral icterus.    Conjunctiva/sclera: Conjunctivae normal.  Cardiovascular:     Pulses: Normal pulses.     Heart sounds: Normal heart sounds.  Pulmonary:     Effort: Pulmonary effort is normal.     Breath sounds: Normal breath sounds.  Abdominal:     General: Abdomen is flat. There is no distension.     Palpations: Abdomen is soft.     Tenderness: There is no abdominal tenderness. There is no guarding.  Skin:    General: Skin is dry.     Capillary Refill: Capillary refill takes less than 2 seconds.     Comments: Mild area of erythema above areola on breast.  Mild TTP involving the areola and nipple.  No significant localization or mass appreciated.  No superficial masses or fluctuance concerning for abscess.  Area of erythema is less than 4 cm diameter.  Mild whitish discharge appreciated.  No nipple inversion or inward pulling of breast tissue.  Neurological:     Mental Status: She is alert and oriented to person, place, and time.     GCS: GCS eye subscore is 4. GCS verbal subscore is 5. GCS motor subscore is 6.  Psychiatric:        Mood and Affect: Mood normal.        Behavior: Behavior normal.        Thought Content:  Thought content normal.     ED Results / Procedures / Treatments   Labs (all labs ordered are listed, but only abnormal results are displayed) Labs Reviewed - No data to display  EKG None  Radiology No results found.  Procedures Procedures (including critical care time)  Medications Ordered in ED Medications - No data to display  ED Course  I have reviewed the triage vital signs and the nursing notes.  Pertinent labs & imaging results that were available during my care of the patient were reviewed by me and considered in my medical decision making (see chart for details).    MDM Rules/Calculators/A&P                      Her physical exam was largely unremarkable.  I cannot appreciate an area concerning for deep abscess on physical exam.  No significant masses appreciated.  Mild TTP involving areola and nipple, however only mild periareolar erythema.  Reasonable to discharge patient with Keflex and instructions to apply warm compresses regularly to facilitate drainage.  If symptoms fail to improve or get worse, she will ultimately require ultrasound imaging.  Given the brevity of illness in addition to her normal vital signs and denial of any systemic symptoms, do not feel as though laboratory work-up or imaging is warranted at this time.  Recommending that she follow-up with [facility] or her prior OB/GYN for ongoing care.  She needs to be reevaluated in 3 to 5 days to ensure that she is improving.  Strict ED return precautions discussed with the patient.  Patient voices understanding is agreeable to the plan.  EDIT: Patient informs me that she often will develop yeast infection subsequent to antibiotic administration.  Will prescribe 2 doses of Diflucan and encourage her to take 1 if she develops symptoms after she takes her Keflex.  She can take a second pill 24 to 48 hours if her symptoms fail to improve.  Final Clinical Impression(s) / ED  Diagnoses Final diagnoses:  Mastitis, acute    Rx / DC Orders ED Discharge Orders         Ordered    cephALEXin (KEFLEX) 500 MG capsule  3 times daily     02/23/20 1144           Reita Chard 02/23/20 [place], Elsie, DO 02/23/20 1151    Corena Herter, PA-C 02/23/20 [place], [place], DO 02/23/20 1622

## 2020-02-23 NOTE — ED Notes (Signed)
Patient verbalizes understanding of discharge instructions. Opportunity for questioning and answers were provided. Armband removed by staff, pt discharged from ED.  

## 2020-02-23 NOTE — Discharge Instructions (Addendum)
Please read the attachment on mastitis.  Please take your Keflex, as prescribed.  I encourage you to use warm compresses regularly to help facilitate continued drainage.  Please follow-up with a primary care provider at [facility] in the next 3 to 5 days to ensure improvement of symptoms.  If your symptoms fail to improve, you may require ultrasound to assess further.  Return to the ED or seek immediate medical attention should experience any new or worsening symptoms.

## 2020-02-27 IMAGING — US US OB TRANSVAGINAL
1 series · 15 of 28 positions shown · non-contrast
Comparison: 04/03/2018

CLINICAL DATA: First trimester pregnancy with inconclusive fetal
viability.

EXAM:
TRANSVAGINAL OB ULTRASOUND
TECHNIQUE: Transvaginal ultrasound was performed for complete evaluation of the
gestation as well as the maternal uterus, adnexal regions, and
pelvic cul-de-sac.

[Series 1: us ob transvaginal · 15 of 37 slices shown]
[im 1/37]
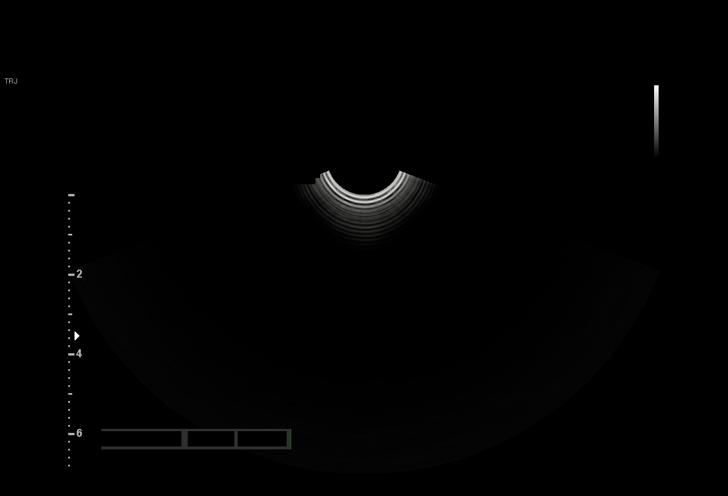
[im 3/37]
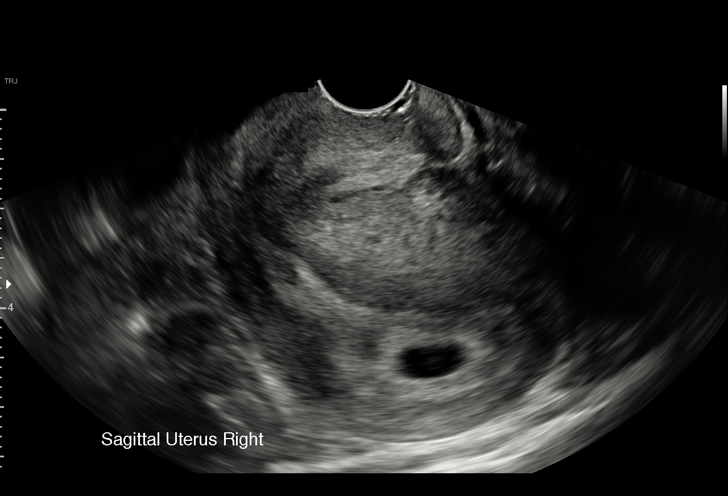
[im 6/37]
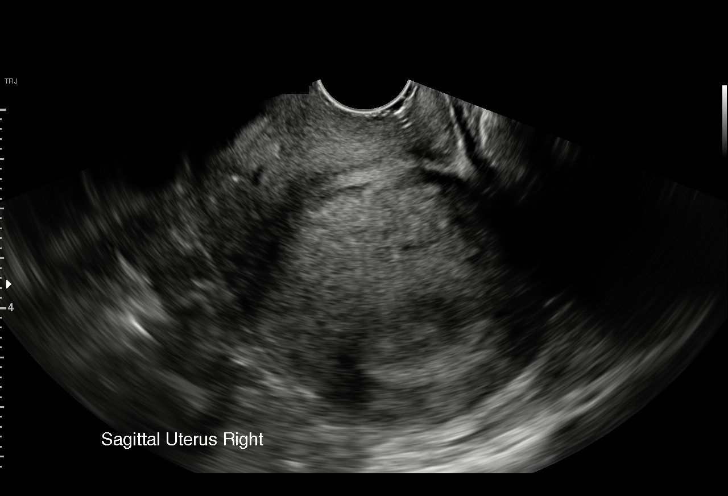
[im 9/37]
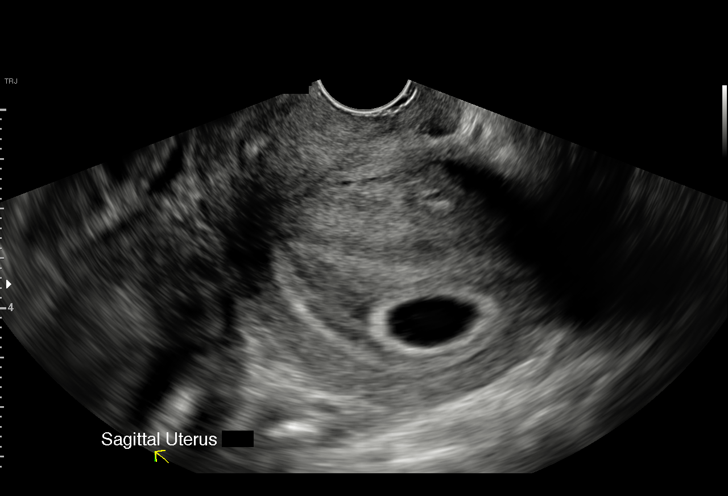
[im 11/37]
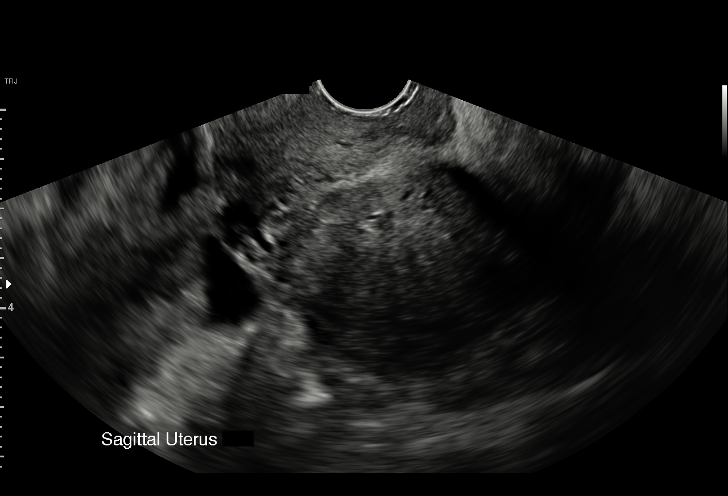
[im 14/37]
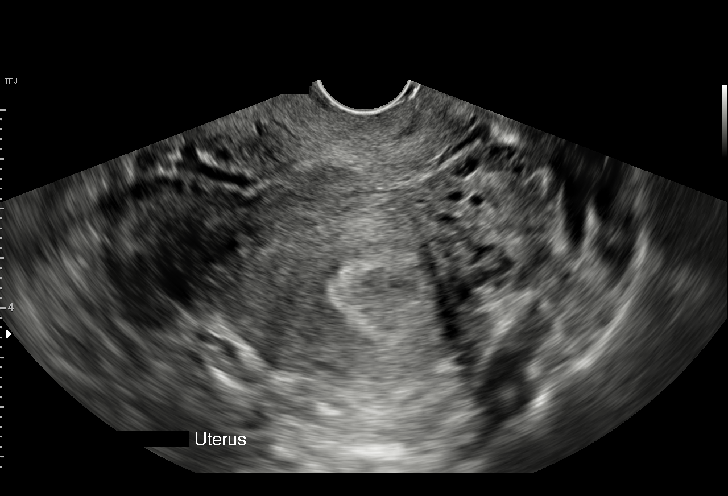
[im 17/37]
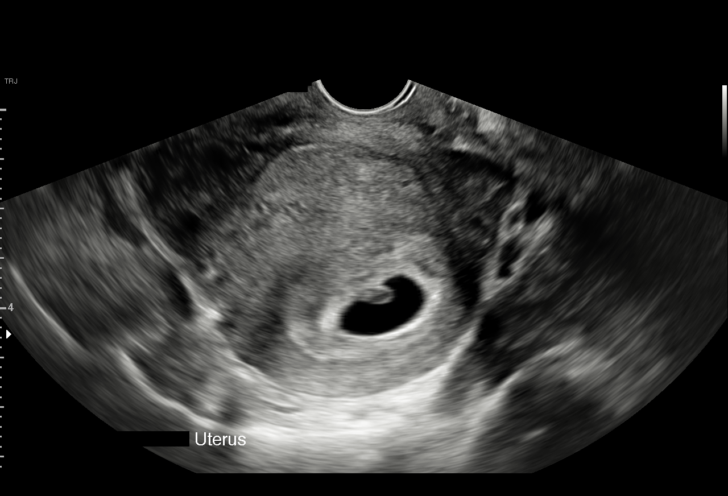
[im 19/37]
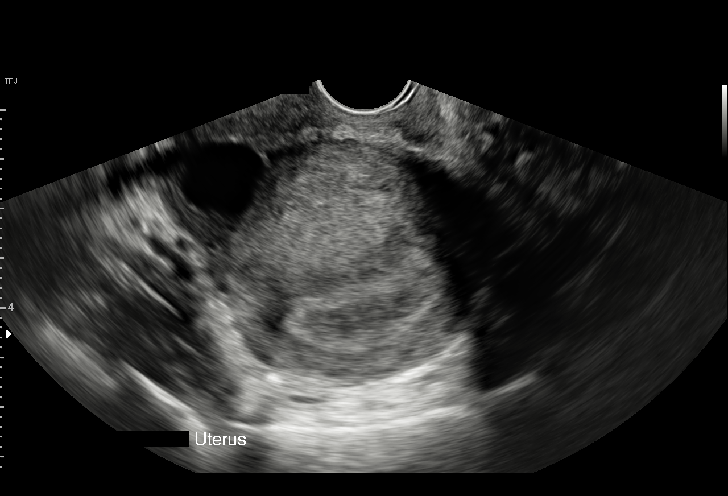
[im 21/37]
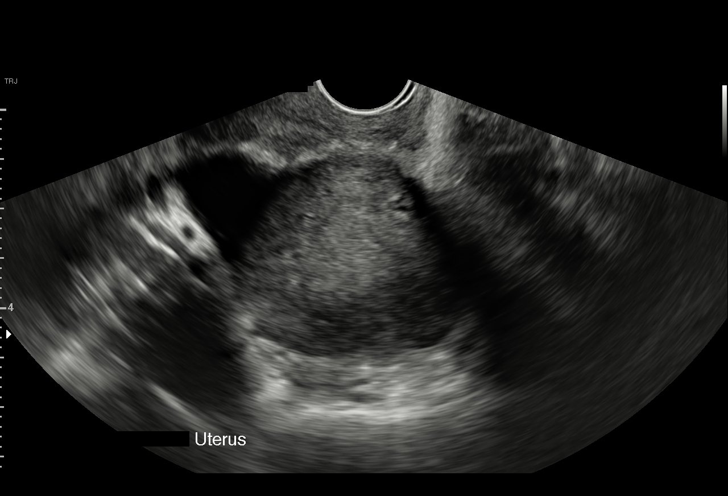
[im 23/37]
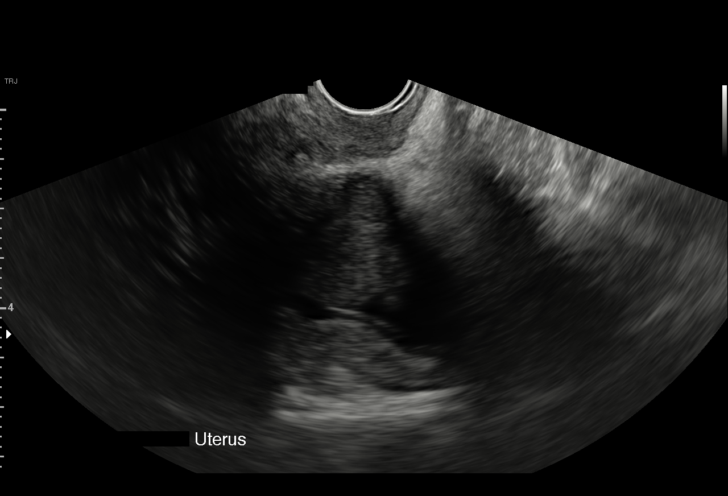
[im 26/37]
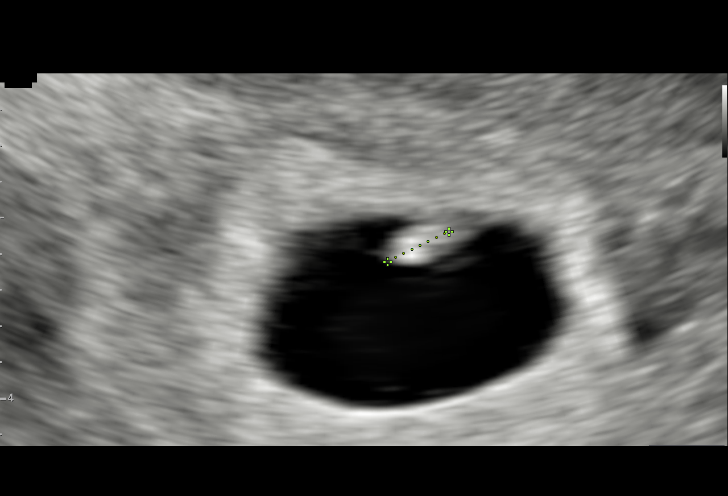
[im 29/37]
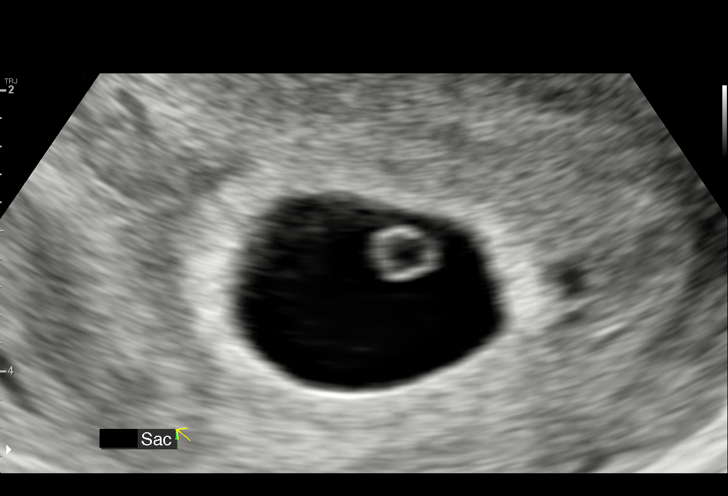
[im 31/37]
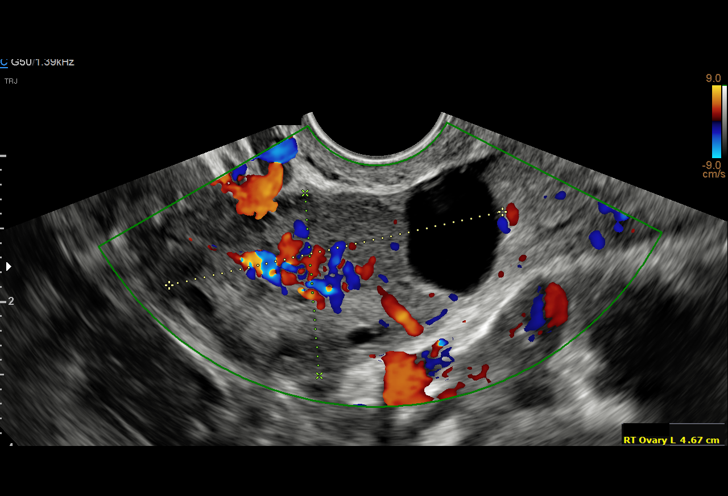
[im 34/37]
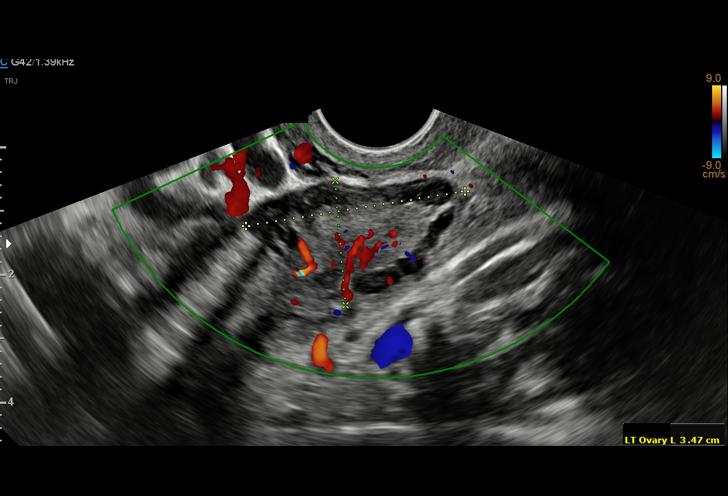
[im 37/37]
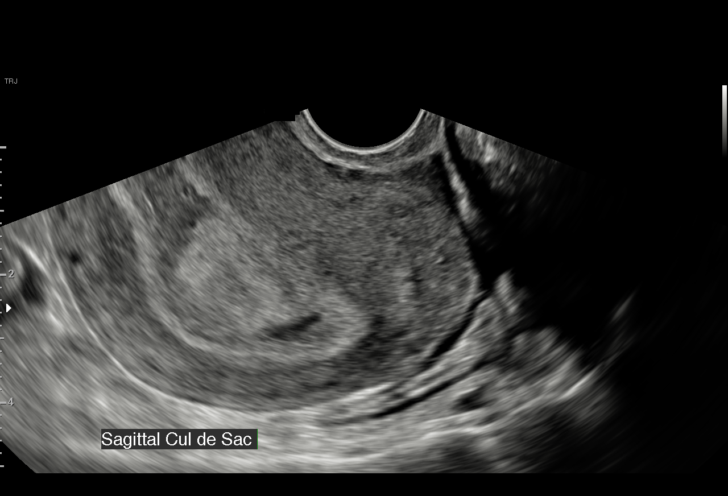

[15 of 28 positions shown; findings below may reference images not displayed]

FINDINGS: Intrauterine gestational sac: Single

Yolk sac:  Visualized.

Embryo:  Visualized.

Cardiac Activity: Visualized.

Heart Rate: 117 bpm

CRL:   4 mm   6 w 0 d                  US EDC: 12/01/2018

Subchorionic hemorrhage:  None visualized.

Maternal uterus/adnexae: Retroflexed uterus. Normal appearance of
both ovaries. No adnexal mass or abnormal free fluid identified.
IMPRESSION: Single living IUP measuring 6 weeks 0 days, with US EDC of
12/01/2018.

No significant maternal uterine or adnexal abnormality identified.

## 2020-03-09 ENCOUNTER — Encounter: Payer: Self-pay | Admitting: *Deleted

## 2020-03-29 ENCOUNTER — Ambulatory Visit: Payer: Medicaid Other | Attending: Nurse Practitioner | Admitting: Nurse Practitioner

## 2020-03-29 ENCOUNTER — Other Ambulatory Visit: Payer: Self-pay | Admitting: Nurse Practitioner

## 2020-03-29 ENCOUNTER — Encounter: Payer: Self-pay | Admitting: Nurse Practitioner

## 2020-03-29 ENCOUNTER — Ambulatory Visit: Payer: Medicaid Other

## 2020-03-29 ENCOUNTER — Other Ambulatory Visit: Payer: Self-pay

## 2020-03-29 DIAGNOSIS — Z87891 Personal history of nicotine dependence: Secondary | ICD-10-CM | POA: Diagnosis not present

## 2020-03-29 DIAGNOSIS — Z131 Encounter for screening for diabetes mellitus: Secondary | ICD-10-CM

## 2020-03-29 DIAGNOSIS — N6310 Unspecified lump in the right breast, unspecified quadrant: Secondary | ICD-10-CM

## 2020-03-29 DIAGNOSIS — Z7689 Persons encountering health services in other specified circumstances: Secondary | ICD-10-CM | POA: Diagnosis not present

## 2020-03-29 DIAGNOSIS — Z130 Encounter for screening for diseases of the blood and blood-forming organs and certain disorders involving the immune mechanism: Secondary | ICD-10-CM

## 2020-03-29 DIAGNOSIS — Z13228 Encounter for screening for other metabolic disorders: Secondary | ICD-10-CM

## 2020-03-29 DIAGNOSIS — N644 Mastodynia: Secondary | ICD-10-CM | POA: Diagnosis not present

## 2020-03-29 DIAGNOSIS — Z13220 Encounter for screening for lipoid disorders: Secondary | ICD-10-CM

## 2020-03-29 NOTE — Progress Notes (Signed)
Virtual Visit via Telephone Note Due to national recommendations of social distancing due to [place] 19, telehealth visit is felt to be most appropriate for this patient at this time.  I discussed the limitations, risks, security and privacy concerns of performing an evaluation and management service by telephone and the availability of in person appointments. I also discussed with the patient that there may be a patient responsible charge related to this service. The patient expressed understanding and agreed to proceed.    I connected with Kim Johns on 03/29/20  at  10:10 AM EDT  EDT by telephone and verified that I am speaking with the correct person using two identifiers.   Consent I discussed the limitations, risks, security and privacy concerns of performing an evaluation and management service by telephone and the availability of in person appointments. I also discussed with the patient that there may be a patient responsible charge related to this service. The patient expressed understanding and agreed to proceed.   Location of Patient: Private Residence    Location of Provider: [place] [facility] [employer] [facility]    Persons participating in Telemedicine visit: Geryl Rankins FNP-BC Amory Kim Johns    History of Present Illness: Telemedicine visit for: [place] Maintenance Last PAP smear 04/13/2018  Recently seen in the ED on 02/23/2020 with concerns of right breast discomfort and mass behind the nipple.  She does have a history of previous right breast abscess that required I&D however she stated in the emergency room that her current breast pain felt different from previous abscess pain.  She does have a 46-monthold baby at home but states she never breast-fed. Most of the right tenderness is felt with with palpation or when she is lying down on her chest.  There is some clear to milky drainage that does come out of the nipple with palpation and  sometimes spontaneously. There is no family history of breast cancer that she is aware of.   Past Medical History:  Diagnosis Date  . Abscess of breast   . Allergy   . Anxiety   . Asthma    as a child - no inhaler  . Bipolar 1 disorder ([place]   . Depression   . GERD (gastroesophageal reflux disease)    diet control  . HSV-2 (herpes simplex virus 2) infection   . Pelvic inflammatory disease   . Preterm labor   . SVD (spontaneous vaginal delivery)    x 2 - fetal demise at 21 weeks, 1 living    Past Surgical History:  Procedure Laterality Date  . CERVICAL CERCLAGE N/A 04/05/2015   Procedure: CERCLAGE CERVICAL;  Surgeon: CLavonia Drafts MD;  [facility];  Service: Gynecology;  Laterality: N/A;  . CERVICAL CERCLAGE N/A 05/18/2018   Procedure: CERCLAGE CERVICAL;  Surgeon: CMora Bellman MD;  [facility];  Service: Gynecology;  Laterality: N/A;  . WISDOM TOOTH EXTRACTION      Family History  Problem Relation Age of Onset  . Diabetes Mother   . Stroke Mother   . Hypertension Father   . Heart attack Father   . Heart disease Father     Social History   Socioeconomic History  . Marital status: Single    Spouse name: Not on file  . Number of children: 2  . Years of education: Not on file  . Highest education level: Not on file  Occupational History  . Not on file  Tobacco Use  . Smoking  status: Former Smoker    Packs/day: 0.50    Years: 10.00    Pack years: 5.00    Types: Cigarettes    Quit date: 03/09/2018    Years since quitting: 2.0  . Smokeless tobacco: Never Used  Substance and Sexual Activity  . Alcohol use: Not Currently    Alcohol/week: 0.0 standard drinks    Comment: occ but none with pregnancy  . Drug use: No  . Sexual activity: Yes    Birth control/protection: None    Comment: none since pregnancy  Other Topics Concern  . Not on file  Social History Narrative  . Not on file   Social Determinants of Health   Financial Resource Strain:  Low Risk   . Difficulty of Paying Living Expenses: Not hard at all  Food Insecurity:   . Worried About Charity fundraiser in the Last Year:   . Arboriculturist in the Last Year:   Transportation Needs:   . Film/video editor (Medical):   Marland Kitchen Lack of Transportation (Non-Medical):   Physical Activity:   . Days of Exercise per Week:   . Minutes of Exercise per Session:   Stress:   . Feeling of Stress :   Social Connections: Unknown  . Frequency of Communication with Friends and Family: Not on file  . Frequency of Social Gatherings with Friends and Family: Not on file  . Attends Religious Services: Not on file  . Active Member of Clubs or Organizations: No  . Attends Archivist Meetings: Never  . Marital Status: Never married     Observations/Objective: Awake, alert and oriented x 3   Review of Systems  Constitutional: Negative for fever, malaise/fatigue and weight loss.  HENT: Negative.  Negative for nosebleeds.   Eyes: Negative.  Negative for blurred vision, double vision and photophobia.  Respiratory: Negative.  Negative for cough and shortness of breath.   Cardiovascular: Negative.  Negative for chest pain, palpitations and leg swelling.  Gastrointestinal: Negative.  Negative for heartburn, nausea and vomiting.  Musculoskeletal: Negative.  Negative for myalgias.  Skin:       SEE HPI  Neurological: Negative.  Negative for dizziness, focal weakness, seizures and headaches.  Psychiatric/Behavioral: Negative.  Negative for suicidal ideas.    Assessment and Plan: Kim Johns was seen today for new patient (initial visit).  Diagnoses and all orders for this visit:  Encounter to establish care  Breast mass, right -     US BREAST LTD UNI RIGHT INC AXILLA; Future  Lipid screening -     Lipid panel; Future  Screening for deficiency anemia -     CBC; Future  Encounter for screening for diabetes mellitus -     Hemoglobin A1c; Future  Screening for metabolic  disorder -     [id number]; Future     Follow Up Instructions Return if symptoms worsen or fail to improve.     I discussed the assessment and treatment plan with the patient. The patient was provided an opportunity to ask questions and all were answered. The patient agreed with the plan and demonstrated an understanding of the instructions.   The patient was advised to call back or seek an in-person evaluation if the symptoms worsen or if the condition fails to improve as anticipated.  I provided 15 minutes of non-face-to-face time during this encounter including median intraservice time, reviewing previous notes, labs, imaging, medications and explaining diagnosis and management.  Gildardo Pounds, FNP-BC

## 2020-03-30 ENCOUNTER — Ambulatory Visit: Payer: Medicaid Other | Attending: Nurse Practitioner

## 2020-03-30 ENCOUNTER — Other Ambulatory Visit: Payer: Self-pay

## 2020-03-30 DIAGNOSIS — Z130 Encounter for screening for diseases of the blood and blood-forming organs and certain disorders involving the immune mechanism: Secondary | ICD-10-CM

## 2020-03-30 DIAGNOSIS — Z13220 Encounter for screening for lipoid disorders: Secondary | ICD-10-CM

## 2020-03-30 DIAGNOSIS — Z13228 Encounter for screening for other metabolic disorders: Secondary | ICD-10-CM

## 2020-03-30 DIAGNOSIS — Z131 Encounter for screening for diabetes mellitus: Secondary | ICD-10-CM

## 2020-03-31 ENCOUNTER — Encounter: Payer: Self-pay | Admitting: Nurse Practitioner

## 2020-03-31 LAB — CBC
Hematocrit: 45.1 % (ref 34.0–46.6)
Hemoglobin: 15.4 g/dL (ref 11.1–15.9)
MCH: 34.1 pg — ABNORMAL HIGH (ref 26.6–33.0)
MCHC: 34.1 g/dL (ref 31.5–35.7)
MCV: 100 fL — ABNORMAL HIGH (ref 79–97)
Platelets: 277 10*3/uL (ref 150–450)
RBC: 4.52 x10E6/uL (ref 3.77–5.28)
RDW: 12.1 % (ref 11.7–15.4)
WBC: 7.1 10*3/uL (ref 3.4–10.8)

## 2020-03-31 LAB — HEMOGLOBIN A1C
Est. average glucose Bld gHb Est-mCnc: 103 mg/dL
Hgb A1c MFr Bld: 5.2 % (ref 4.8–5.6)

## 2020-03-31 LAB — CMP14+EGFR
ALT: 10 IU/L (ref 0–32)
AST: 19 IU/L (ref 0–40)
Albumin/Globulin Ratio: 1.8 (ref 1.2–2.2)
Albumin: 4.5 g/dL (ref 3.9–5.0)
Alkaline Phosphatase: 50 IU/L (ref 39–117)
BUN/Creatinine Ratio: 11 (ref 9–23)
BUN: 9 mg/dL (ref 6–20)
Bilirubin Total: 0.3 mg/dL (ref 0.0–1.2)
CO2: 25 mmol/L (ref 20–29)
Calcium: 9.6 mg/dL (ref 8.7–10.2)
Chloride: 104 mmol/L (ref 96–106)
Creatinine, Ser: 0.85 mg/dL (ref 0.57–1.00)
GFR calc Af Amer: 107 mL/min/{1.73_m2} (ref >59)
GFR calc non Af Amer: 93 mL/min/{1.73_m2} (ref >59)
Globulin, Total: 2.5 g/dL (ref 1.5–4.5)
Glucose: 87 mg/dL (ref 65–99)
Potassium: 4.7 mmol/L (ref 3.5–5.2)
Sodium: 142 mmol/L (ref 134–144)
Total Protein: 7 g/dL (ref 6.0–8.5)

## 2020-03-31 LAB — LIPID PANEL
Chol/HDL Ratio: 2.4 ratio (ref 0.0–4.4)
Cholesterol, Total: 157 mg/dL (ref 100–199)
HDL: 66 mg/dL (ref >39)
LDL Chol Calc (NIH): 77 mg/dL (ref 0–99)
Triglycerides: 70 mg/dL (ref 0–149)
VLDL Cholesterol Cal: 14 mg/dL (ref 5–40)

## 2020-04-06 ENCOUNTER — Ambulatory Visit
Admission: RE | Admit: 2020-04-06 | Discharge: 2020-04-06 | Disposition: A | Discharge status: Home / Self Care | Payer: Medicaid Other | Source: Ambulatory Visit | Attending: Nurse Practitioner | Admitting: Nurse Practitioner

## 2020-04-06 ENCOUNTER — Other Ambulatory Visit: Payer: Self-pay | Admitting: Nurse Practitioner

## 2020-04-06 ENCOUNTER — Other Ambulatory Visit: Payer: Self-pay

## 2020-04-06 DIAGNOSIS — N6310 Unspecified lump in the right breast, unspecified quadrant: Secondary | ICD-10-CM

## 2020-04-07 ENCOUNTER — Other Ambulatory Visit: Payer: Self-pay | Admitting: Nurse Practitioner

## 2020-04-07 MED ORDER — FLUCONAZOLE 150 MG PO TABS: 150.0000 mg | ORAL_TABLET | Freq: Once | ORAL | 0 refills | Status: AC

## 2020-04-17 ENCOUNTER — Encounter: Payer: Self-pay | Admitting: Nurse Practitioner

## 2020-04-17 ENCOUNTER — Other Ambulatory Visit: Payer: Self-pay | Admitting: Nurse Practitioner

## 2020-04-17 ENCOUNTER — Ambulatory Visit
Admission: RE | Admit: 2020-04-17 | Discharge: 2020-04-17 | Disposition: A | Discharge status: Home / Self Care | Payer: Medicaid Other | Source: Ambulatory Visit | Attending: Nurse Practitioner | Admitting: Nurse Practitioner

## 2020-04-17 ENCOUNTER — Other Ambulatory Visit: Payer: Self-pay

## 2020-04-17 DIAGNOSIS — N6310 Unspecified lump in the right breast, unspecified quadrant: Secondary | ICD-10-CM

## 2020-04-17 MED ORDER — FLUCONAZOLE 150 MG PO TABS
150.0000 mg | ORAL_TABLET | Freq: Once | ORAL | 1 refills | Status: AC
Start: 2020-04-17 — End: 2020-04-17

## 2020-04-17 NOTE — Telephone Encounter (Signed)
Will route PCP for refill request.

## 2020-04-17 NOTE — Telephone Encounter (Signed)
Please add refill if appropriate per PCP

## 2020-05-08 ENCOUNTER — Encounter: Payer: Self-pay | Admitting: Nurse Practitioner

## 2020-05-09 ENCOUNTER — Other Ambulatory Visit: Payer: Self-pay | Admitting: Nurse Practitioner

## 2020-05-09 DIAGNOSIS — N611 Abscess of the breast and nipple: Secondary | ICD-10-CM

## 2020-06-08 ENCOUNTER — Other Ambulatory Visit (HOSPITAL_COMMUNITY)
Admission: RE | Admit: 2020-06-08 | Discharge: 2020-06-08 | Disposition: A | Discharge status: Home / Self Care | Payer: Medicaid Other | Source: Ambulatory Visit | Attending: Family Medicine | Admitting: Family Medicine

## 2020-06-08 ENCOUNTER — Other Ambulatory Visit: Payer: Self-pay

## 2020-06-08 ENCOUNTER — Ambulatory Visit (INDEPENDENT_AMBULATORY_CARE_PROVIDER_SITE_OTHER): Payer: Medicaid Other | Admitting: Lactation Services

## 2020-06-08 DIAGNOSIS — N898 Other specified noninflammatory disorders of vagina: Secondary | ICD-10-CM

## 2020-06-08 DIAGNOSIS — Z113 Encounter for screening for infections with a predominantly sexual mode of transmission: Secondary | ICD-10-CM

## 2020-06-08 DIAGNOSIS — Z202 Contact with and (suspected) exposure to infections with a predominantly sexual mode of transmission: Secondary | ICD-10-CM | POA: Diagnosis not present

## 2020-06-08 NOTE — Progress Notes (Signed)
Patient her for STD testing and Vaginal Odor. Patient denies itching, burning, or discharge, she has some odor. She denies being exposed to an STD that she is aware of. Self Swab obtained. Patient declined blood testing for Hepatitis.   Patient reports she has a history of wart on her perineum about 15 years ago. She reports a possible hemorrhoid on her rectum with bleeding with stooling when wiping. She denies pain with stooling. Sometimes she strains with stooling and other days it is soft and more frequent. She does not take a stool softener. She has used Tucks recently. Enc patient to try Preparation H or Anusol and to return for visit to check for warts per Dr. Ilda Basset.

## 2020-06-08 NOTE — Addendum Note (Signed)
Addended by: Donn Pierini on: 06/08/2020 09:52 AM   Modules accepted: Orders

## 2020-06-09 LAB — CERVICOVAGINAL ANCILLARY ONLY
Bacterial Vaginitis (gardnerella): NEGATIVE
Candida Glabrata: NEGATIVE
Candida Vaginitis: POSITIVE — AB
Chlamydia: NEGATIVE
Comment: NEGATIVE
Comment: NEGATIVE
Comment: NEGATIVE
Comment: NEGATIVE
Comment: NEGATIVE
Comment: NORMAL
Neisseria Gonorrhea: NEGATIVE
Trichomonas: NEGATIVE

## 2020-06-12 ENCOUNTER — Encounter: Payer: Self-pay | Admitting: Nurse Practitioner

## 2020-06-12 ENCOUNTER — Telehealth: Payer: Self-pay | Admitting: *Deleted

## 2020-06-12 MED ORDER — FLUCONAZOLE 150 MG PO TABS
ORAL_TABLET | ORAL | 1 refills | Status: DC
Start: 2020-06-12 — End: 2020-09-05

## 2020-06-12 NOTE — Telephone Encounter (Signed)
Pt left VM message stating that she had received a MyChart message regarding test results and recommendation of OTC medication. She stated that the OTC medication does not work for her and she is requesting Diflucan with a refill. In the past when she has had yeast it takes more than one dose to treat the problem. I discussed with Dr. Ilda Basset and he approved Diflucan. Rx e-prescribed. I called and notified pt that Rx has been sent. Dosage instructions given.  She voiced understanding.

## 2020-06-13 NOTE — Progress Notes (Signed)
Patient was assessed and managed by nursing staff during this encounter. I have reviewed the chart and agree with the documentation and plan. I have also made any necessary editorial changes.  Aletha Halim, MD 06/13/2020 8:19 AM

## 2020-06-20 ENCOUNTER — Telehealth: Payer: Self-pay

## 2020-06-20 NOTE — Telephone Encounter (Signed)
Pt request refill on Valtrex.

## 2020-06-21 ENCOUNTER — Telehealth (INDEPENDENT_AMBULATORY_CARE_PROVIDER_SITE_OTHER): Payer: Medicaid Other | Admitting: Lactation Services

## 2020-06-21 DIAGNOSIS — B009 Herpesviral infection, unspecified: Secondary | ICD-10-CM

## 2020-06-21 MED ORDER — VALACYCLOVIR HCL 1 G PO TABS
1000.0000 mg | ORAL_TABLET | Freq: Two times a day (BID) | ORAL | 1 refills | Status: DC
Start: 2020-06-21 — End: 2021-03-09

## 2020-06-21 NOTE — Telephone Encounter (Signed)
Called patient to inform her Valtrex has been sent in. Patient has follow up annual appt on 8/12 in the office. Patient voiced understanding.

## 2020-07-06 ENCOUNTER — Ambulatory Visit: Payer: Medicaid Other

## 2020-07-26 ENCOUNTER — Other Ambulatory Visit (HOSPITAL_COMMUNITY)
Admission: RE | Admit: 2020-07-26 | Discharge: 2020-07-26 | Disposition: A | Discharge status: Home / Self Care | Payer: Medicaid Other | Source: Ambulatory Visit | Attending: Obstetrics & Gynecology | Admitting: Obstetrics & Gynecology

## 2020-07-26 ENCOUNTER — Ambulatory Visit (INDEPENDENT_AMBULATORY_CARE_PROVIDER_SITE_OTHER): Payer: Medicaid Other

## 2020-07-26 ENCOUNTER — Other Ambulatory Visit: Payer: Self-pay

## 2020-07-26 VITALS — BP 132/78 | HR 88 | Ht 64.0 in | Wt 203.9 lb

## 2020-07-26 DIAGNOSIS — Z113 Encounter for screening for infections with a predominantly sexual mode of transmission: Secondary | ICD-10-CM | POA: Insufficient documentation

## 2020-07-26 DIAGNOSIS — R1084 Generalized abdominal pain: Secondary | ICD-10-CM

## 2020-07-26 NOTE — Progress Notes (Signed)
Patient was assessed and managed by nursing staff during this encounter. I have reviewed the chart and agree with the documentation and plan. I have also made any necessary editorial changes.  Verita Schneiders, MD 07/26/2020 10:35 AM

## 2020-07-26 NOTE — Progress Notes (Signed)
Pt here for Self swab, symptoms:  >Discharge w/ odor and cramping. Pt states had these same symptoms prev & tested + for Yeast. Was Tx with Diflucan & she took another med over the counter. Feels like she has it again. Pt also wanted a Urine Culture done, Advised takes  A couple of days for results to come back, will show on My Chart before we can see.Pt also concerned that she may have PID. Pt has GYN visit on 08/16/20, advised they will discuss at that appointment. Pt verbalized understanding.

## 2020-07-27 ENCOUNTER — Other Ambulatory Visit: Payer: Self-pay | Admitting: Obstetrics & Gynecology

## 2020-07-27 ENCOUNTER — Encounter: Payer: Self-pay | Admitting: Advanced Practice Midwife

## 2020-07-27 DIAGNOSIS — B9689 Other specified bacterial agents as the cause of diseases classified elsewhere: Secondary | ICD-10-CM

## 2020-07-27 LAB — CERVICOVAGINAL ANCILLARY ONLY
Bacterial Vaginitis (gardnerella): POSITIVE — AB
Candida Glabrata: NEGATIVE
Candida Vaginitis: NEGATIVE
Chlamydia: NEGATIVE
Comment: NEGATIVE
Comment: NEGATIVE
Comment: NEGATIVE
Comment: NEGATIVE
Comment: NEGATIVE
Comment: NORMAL
Neisseria Gonorrhea: NEGATIVE
Trichomonas: NEGATIVE

## 2020-07-27 MED ORDER — METRONIDAZOLE 500 MG PO TABS: 500.0000 mg | ORAL_TABLET | Freq: Two times a day (BID) | ORAL | 0 refills | Status: AC

## 2020-08-16 ENCOUNTER — Ambulatory Visit: Payer: Medicaid Other | Admitting: Advanced Practice Midwife

## 2020-09-05 ENCOUNTER — Ambulatory Visit (INDEPENDENT_AMBULATORY_CARE_PROVIDER_SITE_OTHER): Payer: Medicaid Other | Admitting: Student

## 2020-09-05 ENCOUNTER — Other Ambulatory Visit (HOSPITAL_COMMUNITY)
Admission: RE | Admit: 2020-09-05 | Discharge: 2020-09-05 | Disposition: A | Discharge status: Home / Self Care | Payer: Medicaid Other | Source: Ambulatory Visit

## 2020-09-05 ENCOUNTER — Other Ambulatory Visit: Payer: Self-pay

## 2020-09-05 ENCOUNTER — Encounter: Payer: Self-pay | Admitting: Student

## 2020-09-05 VITALS — BP 132/82 | HR 80 | Ht 63.0 in | Wt 205.9 lb

## 2020-09-05 DIAGNOSIS — Z3202 Encounter for pregnancy test, result negative: Secondary | ICD-10-CM

## 2020-09-05 DIAGNOSIS — N939 Abnormal uterine and vaginal bleeding, unspecified: Secondary | ICD-10-CM

## 2020-09-05 DIAGNOSIS — Z30011 Encounter for initial prescription of contraceptive pills: Secondary | ICD-10-CM

## 2020-09-05 DIAGNOSIS — N898 Other specified noninflammatory disorders of vagina: Secondary | ICD-10-CM | POA: Insufficient documentation

## 2020-09-05 DIAGNOSIS — K644 Residual hemorrhoidal skin tags: Secondary | ICD-10-CM | POA: Diagnosis not present

## 2020-09-05 DIAGNOSIS — N760 Acute vaginitis: Secondary | ICD-10-CM

## 2020-09-05 LAB — POCT PREGNANCY, URINE: Preg Test, Ur: NEGATIVE

## 2020-09-05 MED ORDER — NORGESTIMATE-ETH ESTRADIOL 0.25-35 MG-MCG PO TABS: 1.0000 | ORAL_TABLET | Freq: Every day | ORAL | 11 refills | Status: DC

## 2020-09-05 NOTE — Patient Instructions (Signed)
Vaginitis Vaginitis is a condition in which the vaginal tissue swells and becomes red (inflamed). This condition is most often caused by a change in the normal balance of bacteria and yeast that live in the vagina. This change causes an overgrowth of certain bacteria or yeast, which causes the inflammation. There are different types of vaginitis, but the most common types are:  Bacterial vaginosis.  Yeast infection (candidiasis).  Trichomoniasis vaginitis. This is a sexually transmitted disease (STD).  Viral vaginitis.  Atrophic vaginitis.  Allergic vaginitis. What are the causes? The cause of this condition depends on the type of vaginitis. It can be caused by:  Bacteria (bacterial vaginosis).  Yeast, which is a fungus (yeast infection).  A parasite (trichomoniasis vaginitis).  A virus (viral vaginitis).  Low hormone levels (atrophic vaginitis). Low hormone levels can occur during pregnancy, breastfeeding, or after menopause.  Irritants, such as bubble baths, scented tampons, and feminine sprays (allergic vaginitis). Other factors can change the normal balance of the yeast and bacteria that live in the vagina. These include:  Antibiotic medicines.  Poor hygiene.  Diaphragms, vaginal sponges, spermicides, birth control pills, and intrauterine devices (IUD).  Sex.  Infection.  Uncontrolled diabetes.  A weakened defense (immune) system. What increases the risk? This condition is more likely to develop in women who:  Smoke.  Use vaginal douches, scented tampons, or scented sanitary pads.  Wear tight-fitting pants.  Wear thong underwear.  Use oral birth control pills or an IUD.  Have sex without a condom.  Have multiple sex partners.  Have an STD.  Frequently use the spermicide nonoxynol-9.  Eat lots of foods high in sugar.  Have uncontrolled diabetes.  Have low estrogen levels.  Have a weakened immune system from an immune disorder or medical  treatment.  Are pregnant or breastfeeding. What are the signs or symptoms? Symptoms vary depending on the cause of the vaginitis. Common symptoms include:  Abnormal vaginal discharge. ? The discharge is white, gray, or yellow with bacterial vaginosis. ? The discharge is thick, white, and cheesy with a yeast infection. ? The discharge is frothy and yellow or greenish with trichomoniasis.  A bad vaginal smell. The smell is fishy with bacterial vaginosis.  Vaginal itching, pain, or swelling.  Sex that is painful.  Pain or burning when urinating. Sometimes there are no symptoms. How is this diagnosed? This condition is diagnosed based on your symptoms and medical history. A physical exam, including a pelvic exam, will also be done. You may also have other tests, including:  Tests to determine the pH level (acidity or alkalinity) of your vagina.  A whiff test, to assess the odor that results when a sample of your vaginal discharge is mixed with a potassium hydroxide solution.  Tests of vaginal fluid. A sample will be examined under a microscope. How is this treated? Treatment varies depending on the type of vaginitis you have. Your treatment may include:  Antibiotic creams or pills to treat bacterial vaginosis and trichomoniasis.  Antifungal medicines, such as vaginal creams or suppositories, to treat a yeast infection.  Medicine to ease discomfort if you have viral vaginitis. Your sexual partner should also be treated.  Estrogen delivered in a cream, pill, suppository, or vaginal ring to treat atrophic vaginitis. If vaginal dryness occurs, lubricants and moisturizing creams may help. You may need to avoid scented soaps, sprays, or douches.  Stopping use of a product that is causing allergic vaginitis. Then using a vaginal cream to treat the symptoms. Follow   these instructions at home: Lifestyle  Keep your genital area clean and dry. Avoid soap, and only rinse the area with  water.  Do not douche or use tampons until your health care provider says it is okay to do so. Use sanitary pads, if needed.  Do not have sex until your health care provider approves. When you can return to sex, practice safe sex and use condoms.  Wipe from front to back. This avoids the spread of bacteria from the rectum to the vagina. General instructions  Take over-the-counter and prescription medicines only as told by your health care provider.  If you were prescribed an antibiotic medicine, take or use it as told by your health care provider. Do not stop taking or using the antibiotic even if you start to feel better.  Keep all follow-up visits as told by your health care provider. This is important. How is this prevented?  Use mild, non-scented products. Do not use things that can irritate the vagina, such as fabric softeners. Avoid the following products if they are scented: ? Feminine sprays. ? Detergents. ? Tampons. ? Feminine hygiene products. ? Soaps or bubble baths.  Let air reach your genital area. ? Wear cotton underwear to reduce moisture buildup. ? Avoid wearing underwear while you sleep. ? Avoid wearing tight pants and underwear or nylons without a cotton panel. ? Avoid wearing thong underwear.  Take off any wet clothing, such as bathing suits, as soon as possible.  Practice safe sex and use condoms. Contact a health care provider if:  You have abdominal pain.  You have a fever.  You have symptoms that last for more than 2-3 days. Get help right away if:  You have a fever and your symptoms suddenly get worse. Summary  Vaginitis is a condition in which the vaginal tissue becomes inflamed.This condition is most often caused by a change in the normal balance of bacteria and yeast that live in the vagina.  Treatment varies depending on the type of vaginitis you have.  Do not douche, use tampons , or have sex until your health care provider approves. When  you can return to sex, practice safe sex and use condoms. This information is not intended to replace advice given to you by your health care provider. Make sure you discuss any questions you have with your health care provider. Document Revised: 10/24/2017 Document Reviewed: 12/17/2016 Elsevier Patient Education  [place].     Alternative Vaginitis Therapies  1) soak in tub of warm water waist high with 1/2 cup of baking soda in water for ~ 20 mins. 2) soak 3 tampons in 1 tablespoon of fractionated (liquid form) coconut oil with 10 drops of Melaleuca (Tea Tree) essential oil, insert 1 saturated tampon vaginally at bedtime x 3 days. Both options are to be done after sexual intercourse, menses and when suspects Bacterial Vaginosis and/or yeast infection. Please be advised that these alternatives will not replace the need to be evaluated, if symptoms persist. You will need to seek care at an OB/GYN provider.  GO WHITE: Soap: UNSCENTED Dove (white box light green writing) Laundry detergent (underwear)- Dreft or [employer] unscented WHITE 100% cotton panties (NOT just cotton crouch) Sanitary napkin/panty liners: UNSCENTED.  If it doesn't SAY unscented it can have a scent/perfume    NO PERFUMES OR LOTIONS OR POTIONS in the vulvar area (may use regular [place]) Condoms: hypoallergenic only. Non dyed (no color) Toilet papers: white only Wash clothes: use a  separate wash cloth. WHITE.  Wash in [place].   You can purchase Tea Tree Oil locally at:  [facility] [address] [place], [place] [address] [phone]  [place] [address] [place] [address] [place], [place] [address] [phone]

## 2020-09-05 NOTE — Progress Notes (Signed)
Pt states is having mild cramps, itching in Vagina w/ thick discharge.

## 2020-09-05 NOTE — Progress Notes (Signed)
History:  [name] is a 29 y.o. [phone] who presents to clinic today for multiple concerns. -Reports vaginal irritation. This has been a recurrent issue for the last few years. Per patient she has had multiple episodes of yeast & bacterial vaginosis. Most recent episode started last week. Reports a thick, curd like discharge & internal vaginal irritation. She is sexually active with a new partner as of a few weeks ago. Prior partner was a 7 year monogamous relationship. She does use condoms. She has not treated these symptoms as OTC treatment has not worked for her in the past.   -She believes she has a hemorrhoid. This has been present since the delivery of her son 2 years ago. States at times it becomes irritated & bleeds but otherwise doesn't bother her. Does have a history of anogenital warts that were removed when she was 14 & is unsure if current issue is a new wart or a hemorrhoid.  -She is currently not using contraception except for irregular use of condoms. She is interested in Steinauer. She has used Sprintec in the past & was happy with it. Would like to restart it.  -Reports irregular cycles since delivery of her son 2 years ago. States at times they will be 3 weeks apart, and sometimes 5 weeks apart. Not heavy bleeding. Consistently feels abdominal cramping even when she's not having her period. LMP was 10/3.   Patient Active Problem List   Diagnosis Date Noted  . Normal labor 11/22/2018  . Yeast infection 09/24/2018  . 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 Renal pyelectasis, fetal, affecting care of mother, antepartum, fetus 1 08/13/2018  . Supervision of high risk pregnancy, antepartum 04/21/2018  . Asthma   . Anxiety 12/17/2016  . 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 History of intrauterine fetal death, currently pregnant   . History of cervical incompetence in pregnancy, currently pregnant     Allergies  Allergen Reactions  . Shellfish  Allergy Hives  . Sulfa Antibiotics Other (See Comments)    Reaction:  Unknown; childhood reaction  . Icy Hot Rash    Current Outpatient Medications on File Prior to Visit  Medication Sig Dispense Refill  . valACYclovir (VALTREX) 1000 MG tablet Take 1 tablet (1,000 mg total) by mouth 2 (two) times daily. 10 tablet 1  . fluconazole (DIFLUCAN) 150 MG tablet Take 1 tablet by mouth and repeat in 3 days 2 tablet 1   No current facility-administered medications on file prior to visit.     The following portions of the patient's history were reviewed and updated as appropriate: allergies, current medications, family history, past medical history, social history, past surgical history and problem list.  Review of Systems:  Other than those mentioned in HPI all ROS negative   Objective:  Physical Exam BP 132/82   Pulse 80   Ht 5\' 3"  (1.6 m)   Wt 205 lb 14.4 oz (93.4 kg)   LMP 08/27/2020 (Approximate)   BMI 36.47 kg/m  CONSTITUTIONAL: Well-developed, well-nourished female in no acute distress.  EYES: EOM intact, conjunctivae normal, no scleral icterus HEAD: Normocephalic, atraumatic RESPIRATORY: Effort normal, no problems with respiration noted. GASTROINTESTINAL:Soft, no distention noted.  No tenderness, rebound or guarding.  GENITOURINARY: Normal appearing external genitalia; normal appearing vaginal mucosa and cervix.  Normal appearing discharge. Normal uterine size, no other palpable masses, no uterine or adnexal tenderness. No cervical motion tenderness. ~0.5 cm external hemorrhoid, flesh colored SKIN: Skin is warm and dry.  No rash noted. Not diaphoretic. No erythema. No pallor. PSYCHIATRIC: Normal mood and affect. Normal behavior. Normal judgment and thought content.    Assessment & Plan:  1. Vaginal itching -Normal exam, no abnormal discharge or erythema. - Cervicovaginal ancillary only( [place])  2. Abnormal uterine bleeding (AUB)  - HCV Ab w Reflex to Quant PCR -  Hepatitis B surface antigen - HIV Antibody (routine testing w rflx) - RPR - Testosterone  3. Encounter for initial prescription of contraceptive pills  - norgestimate-ethinyl estradiol (ORTHO-CYCLEN) 0.25-35 MG-MCG tablet; Take 1 tablet by mouth daily.  Dispense: 28 tablet; Refill: 11  4. External hemorrhoid -no evidence of condyloma. Small flesh colored external hemorrhoid. Reviewed avoiding constipation. If symptoms worsen can f/u with [facility] or GI for further treatment although I wouldn't recommend that at this point.  - if notices additional lesions that may be concerning for condyloma, should return for evaluation & treatment  5. Recurrent vaginitis -no evidence of infection at this time. Will treat accordingly based on results. Will also collect labs for full STI testing.    Jorje Guild, NP 09/05/2020 7:11 PM

## 2020-09-06 ENCOUNTER — Encounter: Payer: Self-pay | Admitting: Student

## 2020-09-06 LAB — CERVICOVAGINAL ANCILLARY ONLY
Bacterial Vaginitis (gardnerella): POSITIVE — AB
Candida Glabrata: NEGATIVE
Candida Vaginitis: POSITIVE — AB
Chlamydia: NEGATIVE
Comment: NEGATIVE
Comment: NEGATIVE
Comment: NEGATIVE
Comment: NEGATIVE
Comment: NEGATIVE
Comment: NORMAL
Neisseria Gonorrhea: NEGATIVE
Trichomonas: NEGATIVE

## 2020-09-06 LAB — HCV AB W REFLEX TO QUANT PCR: HCV Ab: <0.1 s/co ratio (ref 0.0–0.9)

## 2020-09-06 LAB — TESTOSTERONE: Testosterone: 31 ng/dL (ref 13–71)

## 2020-09-06 LAB — HIV ANTIBODY (ROUTINE TESTING W REFLEX): HIV Screen 4th Generation wRfx: NONREACTIVE

## 2020-09-06 LAB — HCV INTERPRETATION

## 2020-09-06 LAB — RPR: RPR Ser Ql: NONREACTIVE

## 2020-09-06 LAB — HEPATITIS B SURFACE ANTIGEN: Hepatitis B Surface Ag: NEGATIVE

## 2020-09-07 ENCOUNTER — Other Ambulatory Visit: Payer: Self-pay | Admitting: Lactation Services

## 2020-09-07 MED ORDER — METRONIDAZOLE 500 MG PO TABS: 500.0000 mg | ORAL_TABLET | Freq: Two times a day (BID) | ORAL | 0 refills | Status: DC

## 2020-09-07 MED ORDER — FLUCONAZOLE 150 MG PO TABS: 150.0000 mg | ORAL_TABLET | Freq: Once | ORAL | 1 refills | Status: AC

## 2020-09-07 NOTE — Progress Notes (Signed)
Flagyl and Diflucan ordered per standing order based on lab results and at patients request.

## 2020-10-02 ENCOUNTER — Other Ambulatory Visit: Payer: Self-pay

## 2020-10-02 MED ORDER — FLUCONAZOLE 150 MG PO TABS
150.0000 mg | ORAL_TABLET | ORAL | 0 refills | Status: AC
Start: 2020-10-02 — End: 2020-10-09

## 2020-10-03 ENCOUNTER — Other Ambulatory Visit: Payer: Self-pay | Admitting: Lactation Services

## 2020-10-03 MED ORDER — FLUCONAZOLE 150 MG PO TABS
150.0000 mg | ORAL_TABLET | ORAL | 5 refills | Status: DC
Start: 2020-10-03 — End: 2020-11-05

## 2020-10-03 MED ORDER — CLINDAMYCIN HCL 300 MG PO CAPS: 300.0000 mg | ORAL_CAPSULE | Freq: Two times a day (BID) | ORAL | 0 refills | Status: DC

## 2020-11-05 ENCOUNTER — Inpatient Hospital Stay (HOSPITAL_COMMUNITY)
Admission: AD | Admit: 2020-11-05 | Discharge: 2020-11-05 | Disposition: A | Discharge status: Home / Self Care | Payer: Medicaid Other | Attending: Obstetrics and Gynecology | Admitting: Obstetrics and Gynecology

## 2020-11-05 ENCOUNTER — Other Ambulatory Visit: Payer: Self-pay

## 2020-11-05 DIAGNOSIS — Z3202 Encounter for pregnancy test, result negative: Secondary | ICD-10-CM | POA: Insufficient documentation

## 2020-11-05 LAB — POCT PREGNANCY, URINE: Preg Test, Ur: NEGATIVE

## 2020-11-05 NOTE — MAU Provider Note (Signed)
Event Date/Time  First Provider Initiated Contact with Patient 11/05/20 0901     S [name] is a 29 y.o. [id number] patient who presents to MAU today with complaint of heavy vaginal bleeding. This is a new problem, onset yesterday 11/04/2020. Patient states she saturated a pad in one hour last night and has continued to bleed heavily since that time. She states she has always had regular periods with light to moderate bleeding.  She denies SOB, dizziness, weakness, syncope.  Patient has not taken a home pregnancy test. She presented to a "[facility]" yesterday and endorses a negative urine pregnancy test at that time. She states she also had a serum pregnancy test drawn but missed their follow-up phone call and is unsure of results. She is requesting another serum test in MAU.  O BP 124/81 (BP Location: Right Arm)   Pulse 84   Temp 98.4 F (36.9 C) (Oral)   Resp 16   SpO2 98%    Physical Exam Vitals and nursing note reviewed. Exam conducted with a chaperone present.  Constitutional:      Appearance: She is obese.  HENT:     Mouth/Throat:     Mouth: Mucous membranes are moist.  Cardiovascular:     Rate and Rhythm: Normal rate.     Pulses: Normal pulses.  Pulmonary:     Effort: Pulmonary effort is normal.  Skin:    Capillary Refill: Capillary refill takes less than 2 seconds.  Neurological:     Mental Status: She is alert and oriented to person, place, and time.  Psychiatric:        Mood and Affect: Mood normal.        Behavior: Behavior normal.        Thought Content: Thought content normal.        Judgment: Judgment normal.    A Medical screening exam complete  Negative urine pregnancy test Quant hCG ordered, expectation for result time explained in MAU Triage Patient requested CNM presence, inquired about wait time Patient verbalized to CNM she has changed her mind and no longer wishes to have Quant hCG collected  P Discharge from MAU in stable  condition List of options for follow-up given  Warning signs for worsening condition that would warrant emergency follow-up discussed Patient may return to MAU as needed   Mallie Snooks, CNM 11/05/2020 10:45 AM

## 2020-11-05 NOTE — Discharge Instructions (Signed)
Human Chorionic Gonadotropin Test Why am I having this test? A human chorionic gonadotropin (hCG) test is done to determine whether you are pregnant. It can also be used:  To diagnose an abnormal pregnancy.  To determine whether you have had a failed pregnancy (miscarriage) or are at risk of one. What is being tested? This test checks the level of the human chorionic gonadotropin (hCG) hormone in the blood. This hormone is produced during pregnancy by the cells that form the placenta. The placenta is the organ that grows inside your womb (uterus) to nourish a developing baby. When you are pregnant, hCG can be detected in your blood or urine 7 to 8 days before your missed period. It continues to go up for the first 8-10 weeks of pregnancy. The presence of hCG in your blood can be measured with several different types of tests. You may have:  A urine test. ? Because this hormone is eliminated from your body by your kidneys, you may have a urine test to find out whether you are pregnant. A home pregnancy test detects whether there is hCG in your urine. ? A urine test only shows whether there is hCG in your urine. It does not measure how much.  A qualitative blood test. ? You may have this type of blood test to find out if you are pregnant. ? This blood test only shows whether there is hCG in your blood. It does not measure how much.  A quantitative blood test. ? This type of blood test measures the amount of hCG in your blood. ? You may have this test to:  Diagnose an abnormal pregnancy.  Check whether you have had a miscarriage.  Determine whether you are at risk of a miscarriage. What kind of sample is taken?     Two kinds of samples may be collected to test for the hCG hormone.  Blood. It is usually collected by inserting a needle into a blood vessel.  Urine. It is usually collected by urinating into a germ-free (sterile) specimen cup. It is best to collect the sample the first  time you urinate in the morning. How do I prepare for this test? No preparation is needed for a blood test.  For the urine test:  Let your health care provider know about: ? All medicines you are taking, including vitamins, herbs, creams, and over-the-counter medicines. ? Any blood in your urine. This may interfere with the result.  Do not drink too much fluid. Drink as you normally would, or as directed by your health care provider. How are the results reported? Depending on the type of test that you have, your test results may be reported as values. Your health care provider will compare your results to normal ranges that were established after testing a large group of people (reference ranges). Reference ranges may vary among labs and hospitals. For this test, common reference ranges that show absence of pregnancy are:  Quantitative hCG blood levels: less than 5 IU/L. Other results will be reported as either positive or negative. For this test, normal results (meaning the absence of pregnancy) are:  Negative for hCG in the urine test.  Negative for hCG in the qualitative blood test. What do the results mean? Urine and qualitative blood test  A negative result could mean: ? That you are not pregnant. ? That the test was done too early in your pregnancy to detect hCG in your blood or urine. If you still have other signs  of pregnancy, the test will be repeated.  A positive result means: ? That you are most likely pregnant. Your health care provider may confirm your pregnancy with an imaging study (ultrasound) of your uterus, if needed. Quantitative blood test Results of the quantitative hCG blood test will be interpreted as follows:  Less than 5 IU/L: You are most likely not pregnant.  Greater than 25 IU/L: You are most likely pregnant.  hCG levels that are higher than expected: ? You are pregnant with twins. ? You have abnormal growths in the uterus.  hCG levels that are  rising more slowly than expected: ? You have an ectopic pregnancy (also called a tubal pregnancy).  hCG levels that are falling: ? You may be having a miscarriage. Talk with your health care provider about what your results mean. Questions to ask your health care provider Ask your health care provider, or the department that is doing the test:  When will my results be ready?  How will I get my results?  What are my treatment options?  What other tests do I need?  What are my next steps? Summary  A human chorionic gonadotropin test is done to determine whether you are pregnant.  When you are pregnant, hCG can be detected in your blood or urine 7 to 8 days before your missed period. It continues to go up for the first 8-10 weeks of pregnancy.  Your hCG level can be measured with different types of tests. You may have a urine test, a qualitative blood test, or a quantitative blood test.  Talk with your health care provider about what your results mean. This information is not intended to replace advice given to you by your health care provider. Make sure you discuss any questions you have with your health care provider. Document Revised: 10/13/2017 Document Reviewed: 10/13/2017 Elsevier Patient Education  2020 [employer].

## 2020-11-05 NOTE — MAU Note (Signed)
Pt reports to mau with c/o heavy bleeding after pos preg test at an urgent care. Pt reports some lower abd cramping.  Pt reports LMP of 09-13-20

## 2020-12-08 ENCOUNTER — Other Ambulatory Visit: Payer: Self-pay | Admitting: *Deleted

## 2020-12-08 MED ORDER — FLUCONAZOLE 150 MG PO TABS
150.0000 mg | ORAL_TABLET | Freq: Once | ORAL | 0 refills | Status: AC
Start: 2020-12-08 — End: 2020-12-08

## 2020-12-18 ENCOUNTER — Emergency Department (HOSPITAL_COMMUNITY)
Admission: EM | Admit: 2020-12-18 | Discharge: 2020-12-18 | Disposition: A | Discharge status: Home / Self Care | Payer: Medicaid Other | Attending: Emergency Medicine | Admitting: Emergency Medicine

## 2020-12-18 ENCOUNTER — Other Ambulatory Visit: Payer: Self-pay

## 2020-12-18 ENCOUNTER — Telehealth (HOSPITAL_COMMUNITY): Payer: Self-pay | Admitting: Physician Assistant

## 2020-12-18 ENCOUNTER — Emergency Department (HOSPITAL_COMMUNITY): Payer: Medicaid Other

## 2020-12-18 ENCOUNTER — Encounter (HOSPITAL_COMMUNITY): Payer: Self-pay

## 2020-12-18 DIAGNOSIS — N939 Abnormal uterine and vaginal bleeding, unspecified: Secondary | ICD-10-CM | POA: Insufficient documentation

## 2020-12-18 DIAGNOSIS — Z87891 Personal history of nicotine dependence: Secondary | ICD-10-CM | POA: Diagnosis not present

## 2020-12-18 DIAGNOSIS — K219 Gastro-esophageal reflux disease without esophagitis: Secondary | ICD-10-CM | POA: Insufficient documentation

## 2020-12-18 DIAGNOSIS — J45909 Unspecified asthma, uncomplicated: Secondary | ICD-10-CM | POA: Insufficient documentation

## 2020-12-18 DIAGNOSIS — R1011 Right upper quadrant pain: Secondary | ICD-10-CM | POA: Diagnosis present

## 2020-12-18 LAB — CBC WITH DIFFERENTIAL/PLATELET
Abs Immature Granulocytes: 0.05 10*3/uL (ref 0.00–0.07)
Basophils Absolute: 0 10*3/uL (ref 0.0–0.1)
Basophils Relative: 0 %
Eosinophils Absolute: 0.1 10*3/uL (ref 0.0–0.5)
Eosinophils Relative: 1 %
HCT: 42.8 % (ref 36.0–46.0)
Hemoglobin: 15.2 g/dL — ABNORMAL HIGH (ref 12.0–15.0)
Immature Granulocytes: 1 %
Lymphocytes Relative: 21 %
Lymphs Abs: 1.9 10*3/uL (ref 0.7–4.0)
MCH: 35.9 pg — ABNORMAL HIGH (ref 26.0–34.0)
MCHC: 35.5 g/dL (ref 30.0–36.0)
MCV: 101.2 fL — ABNORMAL HIGH (ref 80.0–100.0)
Monocytes Absolute: 0.8 10*3/uL (ref 0.1–1.0)
Monocytes Relative: 9 %
Neutro Abs: 6.1 10*3/uL (ref 1.7–7.7)
Neutrophils Relative %: 68 %
Platelets: 230 10*3/uL (ref 150–400)
RBC: 4.23 MIL/uL (ref 3.87–5.11)
RDW: 12.9 % (ref 11.5–15.5)
WBC: 8.9 10*3/uL (ref 4.0–10.5)
nRBC: 0 % (ref 0.0–0.2)

## 2020-12-18 LAB — URINALYSIS, ROUTINE W REFLEX MICROSCOPIC
Bilirubin Urine: NEGATIVE
Glucose, UA: NEGATIVE mg/dL
Ketones, ur: NEGATIVE mg/dL
Nitrite: NEGATIVE
Protein, ur: NEGATIVE mg/dL
RBC / HPF: >50 RBC/hpf — ABNORMAL HIGH (ref 0–5)
Specific Gravity, Urine: 1.018 (ref 1.005–1.030)
pH: 6 (ref 5.0–8.0)

## 2020-12-18 LAB — COMPREHENSIVE METABOLIC PANEL
ALT: 12 U/L (ref 0–44)
AST: 14 U/L — ABNORMAL LOW (ref 15–41)
Albumin: 4 g/dL (ref 3.5–5.0)
Alkaline Phosphatase: 33 U/L — ABNORMAL LOW (ref 38–126)
Anion gap: 10 (ref 5–15)
BUN: 8 mg/dL (ref 6–20)
CO2: 20 mmol/L — ABNORMAL LOW (ref 22–32)
Calcium: 9.2 mg/dL (ref 8.9–10.3)
Chloride: 109 mmol/L (ref 98–111)
Creatinine, Ser: 0.65 mg/dL (ref 0.44–1.00)
GFR, Estimated: >60 mL/min (ref >60)
Glucose, Bld: 93 mg/dL (ref 70–99)
Potassium: 3.8 mmol/L (ref 3.5–5.1)
Sodium: 139 mmol/L (ref 135–145)
Total Bilirubin: 0.7 mg/dL (ref 0.3–1.2)
Total Protein: 7.3 g/dL (ref 6.5–8.1)

## 2020-12-18 LAB — LIPASE, BLOOD: Lipase: 20 U/L (ref 11–51)

## 2020-12-18 LAB — WET PREP, GENITAL
Clue Cells Wet Prep HPF POC: NONE SEEN
Sperm: NONE SEEN
Trich, Wet Prep: NONE SEEN
Yeast Wet Prep HPF POC: NONE SEEN

## 2020-12-18 LAB — I-STAT BETA HCG BLOOD, ED (MC, WL, AP ONLY): I-stat hCG, quantitative: 6.9 m[IU]/mL — ABNORMAL HIGH (ref <5)

## 2020-12-18 LAB — HCG, QUANTITATIVE, PREGNANCY: hCG, Beta Chain, Quant, S: <1 m[IU]/mL (ref <5)

## 2020-12-18 LAB — HIV ANTIBODY (ROUTINE TESTING W REFLEX): HIV Screen 4th Generation wRfx: NONREACTIVE

## 2020-12-18 MED ORDER — LIDOCAINE VISCOUS HCL 2 % MT SOLN
15.0000 mL | Freq: Once | OROMUCOSAL | Status: AC
  Administered 2020-12-18: 15 mL via ORAL
  Filled 2020-12-18: qty 15

## 2020-12-18 MED ORDER — ALUM & MAG HYDROXIDE-SIMETH 200-200-20 MG/5ML PO SUSP
30.0000 mL | Freq: Once | ORAL | Status: AC
  Administered 2020-12-18: 30 mL via ORAL
  Filled 2020-12-18: qty 30

## 2020-12-18 MED ORDER — SODIUM CHLORIDE 0.9 % IV BOLUS
1000.0000 mL | Freq: Once | INTRAVENOUS | Status: AC
  Administered 2020-12-18: 1000 mL via INTRAVENOUS

## 2020-12-18 MED ORDER — MORPHINE SULFATE (PF) 4 MG/ML IV SOLN
4.0000 mg | Freq: Once | INTRAVENOUS | Status: AC
  Administered 2020-12-18: 4 mg via INTRAVENOUS
  Filled 2020-12-18: qty 1

## 2020-12-18 MED ORDER — PANTOPRAZOLE SODIUM 20 MG PO TBEC: 20.0000 mg | DELAYED_RELEASE_TABLET | Freq: Every day | ORAL | 0 refills | Status: DC

## 2020-12-18 MED ORDER — ONDANSETRON HCL 4 MG/2ML IJ SOLN
4.0000 mg | Freq: Once | INTRAMUSCULAR | Status: AC
  Administered 2020-12-18: 4 mg via INTRAVENOUS
  Filled 2020-12-18: qty 2

## 2020-12-18 MED ORDER — SUCRALFATE 1 G PO TABS: 1.0000 g | ORAL_TABLET | Freq: Three times a day (TID) | ORAL | 0 refills | Status: DC

## 2020-12-18 NOTE — ED Triage Notes (Signed)
Pain under right breast radiating to right shoulder described as sharp pain, worse with inhalation, non tender to touch since yesterday. Patient reports heavy vaginal bleeding with menstrual x10 days. Reports tx for recurrent BV and currently has fishy smelling odor with no discharge

## 2020-12-18 NOTE — ED Provider Notes (Signed)
[facility] DEPT Provider Note   CSN: [id number] Arrival date & time: 12/18/20  3825    History Chief Complaint  Patient presents with  . Abdominal Pain  . Vaginal Bleeding    Kim Johns is a 30 y.o. female   10 days ago persistent menstrual cycle, heavy changing pad every 2 hours, heavy periods at baseline, irregular. Not routinely followed by Obgyn, No BC, Lower abd cramping x 9 days, improving.  Seen by OB/GYN in October for similar complaints.  Told to start birth control.  Patient states her menstrual cycle ended after her OB appointment and she did not feel like she needed to start the birth control.  Bleeding has not improved.  No clots.  No lightheadedness, dizziness.  Right upper abd pain, radiates into right shoulder, started last night. Worse with eating, No NSAID use, Etoh 1-2x weekly, No previous episodes, Better if still and laying on back. No CP, SOB, hemoptysis, unilateral leg swelling, redness, warmth, hx of PE, DVT, recent surgery, immobilization.  Rates pain a 7/10.  No cough.  No known history of stones.  No prior abdominal surgeries.  Bowel movement today without issue.  Does occasionally have some burning sensation to epigastric region worse with greasy fatty food melena or bright red blood per rectum.  BV symptoms- recurrent BV after menstrual cycle. Nearly monthly, treated 2 weeks ago. Vaginal smell, no dc or itching. Sexually active with protection, Female and female partners. Requesting STD testing  G3P2, last preg 2 years ago, denies chance of current pregnancy.  Denies additional aggravating or relieving factors.  Has not taken anything for symptoms.  History obtained from patient and past medical records.  No interpreter used    HPI     Past Medical History:  Diagnosis Date  . Abscess of breast   . Allergy   . Anxiety   . Asthma    as a child - no inhaler  . Bipolar 1 disorder ([place])   . Depression   . GERD  (gastroesophageal reflux disease)    diet control  . HSV-2 (herpes simplex virus 2) infection   . Pelvic inflammatory disease   . Preterm labor   . SVD (spontaneous vaginal delivery)    x 2 - fetal demise at 21 weeks, 1 living    Patient Active Problem List   Diagnosis Date Noted  . Yeast infection 09/24/2018  . 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 Renal pyelectasis, fetal, affecting care of mother, antepartum, fetus 1 08/13/2018  . Asthma   . Anxiety 12/17/2016  . 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 History of intrauterine fetal death, currently pregnant   . History of cervical incompetence in pregnancy, currently pregnant     Past Surgical History:  Procedure Laterality Date  . CERVICAL CERCLAGE N/A 04/05/2015   Procedure: CERCLAGE CERVICAL;  Surgeon: Lavonia Drafts, MD;  Location: [facility];  Service: Gynecology;  Laterality: N/A;  . CERVICAL CERCLAGE N/A 05/18/2018   Procedure: CERCLAGE CERVICAL;  Surgeon: Mora Bellman, MD;  Location: [facility];  Service: Gynecology;  Laterality: N/A;  . WISDOM TOOTH EXTRACTION       OB History    Gravida  3   Para  3   Term  2   Preterm  1   AB  0   Living  2     SAB  0   IAB  0   Ectopic  0   Multiple  0  Live Births  2           Family History  Problem Relation Age of Onset  . Diabetes Mother   . Stroke Mother   . Hypertension Father   . Heart attack Father   . Heart disease Father     Social History   Tobacco Use  . Smoking status: Former Smoker    Packs/day: 0.50    Years: 10.00    Pack years: 5.00    Types: Cigarettes    Quit date: 03/09/2018    Years since quitting: 2.7  . Smokeless tobacco: Never Used  Vaping Use  . Vaping Use: Never used  Substance Use Topics  . Alcohol use: Not Currently    Alcohol/week: 0.0 standard drinks    Comment: occ but none with pregnancy  . Drug use: No    Home Medications Prior to Admission medications   Medication Sig  Start Date End Date Taking? Authorizing Provider  moxifloxacin (VIGAMOX) 0.5 % ophthalmic solution Place 1 drop into the right eye 3 (three) times daily as needed. stye 12/05/20  Yes [provider]  pantoprazole (PROTONIX) 20 MG tablet Take 1 tablet (20 mg total) by mouth daily. 12/18/20  Yes Naysha Sholl A, PA-C  sucralfate (CARAFATE) 1 g tablet Take 1 tablet (1 g total) by mouth 4 (four) times daily -  with meals and at bedtime for 10 days. 12/18/20 12/28/20 Yes Jayquan Bradsher A, PA-C  norgestimate-ethinyl estradiol (ORTHO-CYCLEN) 0.25-35 MG-MCG tablet Take 1 tablet by mouth daily. Patient not taking: Reported on 12/18/2020 09/05/20   Jorje Guild, NP  valACYclovir (VALTREX) 1000 MG tablet Take 1 tablet (1,000 mg total) by mouth 2 (two) times daily. Patient not taking: Reported on 12/18/2020 06/21/20   Rasch, Artist Pais, NP   Allergies    Shellfish allergy, Sulfa antibiotics, and Icy hot  Review of Systems   Review of Systems  Constitutional: Negative.   HENT: Negative.   Respiratory: Negative.   Cardiovascular: Negative.   Gastrointestinal: Positive for abdominal pain and nausea. Negative for abdominal distention, anal bleeding, blood in stool, constipation, diarrhea, rectal pain and vomiting.  Genitourinary: Positive for menstrual problem and vaginal bleeding. Negative for decreased urine volume, difficulty urinating, dyspareunia, dysuria, enuresis, flank pain, frequency, genital sores, hematuria, pelvic pain, urgency, vaginal discharge and vaginal pain.  Musculoskeletal: Negative.   Skin: Negative.   Neurological: Negative.   All other systems reviewed and are negative.  Physical Exam Updated Vital Signs BP 121/81   Pulse 72   Temp 98 F (36.7 C)   Resp 18   Ht 5\' 3"  (1.6 m)   Wt 89.8 kg   SpO2 99%   BMI 35.07 kg/m   Physical Exam Vitals and nursing note reviewed.  Constitutional:      General: She is not in acute distress.    Appearance: She is well-developed  and well-nourished. She is not ill-appearing, toxic-appearing or diaphoretic.  HENT:     Head: Normocephalic and atraumatic.     Mouth/Throat:     Mouth: Mucous membranes are moist.  Eyes:     Pupils: Pupils are equal, round, and reactive to light.  Cardiovascular:     Rate and Rhythm: Normal rate.     Pulses: Intact distal pulses.     Heart sounds: Normal heart sounds.  Pulmonary:     Effort: Pulmonary effort is normal. No respiratory distress.     Breath sounds: Normal breath sounds.     Comments: Speaks in  full sentences without difficulty. Abdominal:     General: Bowel sounds are normal. There is no distension.     Palpations: Abdomen is soft.     Tenderness: There is abdominal tenderness in the right upper quadrant, epigastric area and left upper quadrant. There is no right CVA tenderness, left CVA tenderness, guarding or rebound. Negative signs include Murphy's sign.     Hernia: No hernia is present.       Comments: Tenderness to upper abdomen RUQ> LUQ. Negative Murphy sign  Genitourinary:    Comments: Normal appearing external female genitalia without rashes or lesions, normal vaginal epithelium. Normal appearing cervix without discharge or petechiae. There is  bleeding noted at the os. No odor. Bimanual: No CMT, nontender.  No palpable adnexal masses or tenderness. Uterus midline and not fixed. Rectovaginal exam was deferred.  No cystocele or rectocele noted. No pelvic lymphadenopathy noted. Wet prep was obtained.  Cultures for gonorrhea and chlamydia collected. Exam performed with chaperone in room. Musculoskeletal:        General: Normal range of motion.     Cervical back: Normal range of motion.     Comments: Moves all 4 extremities without difficulty.  No bony tenderness.  Skin:    General: Skin is warm and dry.  Neurological:     Mental Status: She is alert.  Psychiatric:        Mood and Affect: Mood and affect normal.    ED Results / Procedures / Treatments    Labs (all labs ordered are listed, but only abnormal results are displayed) Labs Reviewed  WET PREP, GENITAL - Abnormal; Notable for the following components:      Result Value   WBC, Wet Prep HPF POC MODERATE (*)    All other components within normal limits  CBC WITH DIFFERENTIAL/PLATELET - Abnormal; Notable for the following components:   Hemoglobin 15.2 (*)    MCV 101.2 (*)    MCH 35.9 (*)    All other components within normal limits  COMPREHENSIVE METABOLIC PANEL - Abnormal; Notable for the following components:   CO2 20 (*)    AST 14 (*)    Alkaline Phosphatase 33 (*)    All other components within normal limits  URINALYSIS, ROUTINE W REFLEX MICROSCOPIC - Abnormal; Notable for the following components:   Hgb urine dipstick LARGE (*)    Leukocytes,Ua TRACE (*)    RBC / HPF >50 (*)    Bacteria, UA RARE (*)    All other components within normal limits  I-STAT BETA HCG BLOOD, ED ([facility], [facility], [facility] ONLY) - Abnormal; Notable for the following components:   I-stat hCG, quantitative 6.9 (*)    All other components within normal limits  URINE CULTURE  LIPASE, BLOOD  HIV ANTIBODY (ROUTINE TESTING W REFLEX)  HCG, QUANTITATIVE, PREGNANCY  RPR  GC/CHLAMYDIA PROBE AMP ([place]) NOT AT [facility]    EKG None  Radiology US Abdomen Limited RUQ (LIVER/GB)  Result Date: 12/18/2020 CLINICAL DATA:  Right upper quadrant pain. EXAM: ULTRASOUND ABDOMEN LIMITED RIGHT UPPER QUADRANT COMPARISON:  03/19/2017. FINDINGS: Gallbladder: No gallstones or wall thickening visualized. No sonographic Murphy sign noted by sonographer. Common bile duct: Diameter: 3.3 mm Liver: No focal lesion identified. Within normal limits in parenchymal echogenicity. Portal vein is patent on color Doppler imaging with normal direction of blood flow towards the liver. Other: None. IMPRESSION: Unremarkable right upper quadrant ultrasound. Electronically Signed   By: Stana Buntinghikanele  Emekauwa M.D.   On: 12/18/2020 10:48  Procedures Procedures (including critical care time)  Medications Ordered in ED Medications  sodium chloride 0.9 % bolus 1,000 mL (0 mLs Intravenous Stopped 12/18/20 1212)  ondansetron (ZOFRAN) injection 4 mg (4 mg Intravenous Given 12/18/20 1013)  morphine 4 MG/ML injection 4 mg (4 mg Intravenous Given 12/18/20 1013)  alum & mag hydroxide-simeth (MAALOX/MYLANTA) 200-200-20 MG/5ML suspension 30 mL (30 mLs Oral Given 12/18/20 1232)    And  lidocaine (XYLOCAINE) 2 % viscous mouth solution 15 mL (15 mLs Oral Given 12/18/20 1232)  morphine 4 MG/ML injection 4 mg (4 mg Intravenous Given 12/18/20 1232)    ED Course  I have reviewed the triage vital signs and the nursing notes.  Pertinent labs & imaging results that were available during my care of the patient were reviewed by me and considered in my medical decision making (see chart for details).  30 year old presents for evaluation multiple complaints.  Afebrile, nonseptic, non-ill-appearing.  1-vaginal bleeding for 10 days.  Was supposed to start on birth control in October however did not for heavy cycles. She does have birth control prescription filled at home however has not started it.  Abdomen soft, nontender.  GU exam without brisk bleeding.  No CMT or adnexal tenderness.  No lightheadedness or dizziness.  2-patient with upper abdominal pain, worse on right.  Worse with food intake.  Also has occasional reflux.  Negative Murphy sign.   Labs and imaging personally reviewed and interpreted: CBC without leukocytosis, hemoglobin stable Metabolic panel without electrolyte, renal or liver normality Pregnancy test negative Lipase 20 Ultrasound negative for stones, gallbladder wall thickening.  Patient reassessed.  Pain improved.  Will start on medication for reflux.  With regards to her vaginal bleeding, her hemoglobin is stable.  I encouraged her to take the medication prescribed to her by her prior OB/GYN.  I have placed a referral for  new OB/GYN in computer.  She will return for new or worsening symptoms.  Patient does not meet the SIRS or Sepsis criteria.  On repeat exam patient does not have a surgical abdomin and there are no peritoneal signs.  No indication of appendicitis, bowel obstruction, bowel perforation, cholecystitis, diverticulitis, PID, TOA, torsion or ectopic pregnancy.   The patient has been appropriately medically screened and/or stabilized in the ED. I have low suspicion for any other emergent medical condition which would require further screening, evaluation or treatment in the ED or require inpatient management.  Patient is hemodynamically stable and in no acute distress.  Patient able to ambulate in department prior to ED.  Evaluation does not show acute pathology that would require ongoing or additional emergent interventions while in the emergency department or further inpatient treatment.  I have discussed the diagnosis with the patient and answered all questions.  Pain is been managed while in the emergency department and patient has no further complaints prior to discharge.  Patient is comfortable with plan discussed in room and is stable for discharge at this time.  I have discussed strict return precautions for returning to the emergency department.  Patient was encouraged to follow-up with PCP/specialist refer to at discharge.     MDM Rules/Calculators/A&P                          Final Clinical Impression(s) / ED Diagnoses Final diagnoses:  RUQ pain  Vaginal bleeding    Rx / DC Orders ED Discharge Orders         Ordered  sucralfate (CARAFATE) 1 g tablet  3 times daily with meals & bedtime        12/18/20 1332    pantoprazole (PROTONIX) 20 MG tablet  Daily        12/18/20 1332           Trinia Georgi A, PA-C 12/18/20 1426    Dorie Rank, MD 12/19/20 [phone]

## 2020-12-18 NOTE — Telephone Encounter (Signed)
Error entry

## 2020-12-18 NOTE — Discharge Instructions (Signed)
Start the birth control pills which were prescribed to you by your prior OB/GYN.  Tylenol and ibuprofen as needed for right upper quadrant pain, try to reduce greasy, fatty food.  Return for any worsening symptoms

## 2020-12-19 ENCOUNTER — Encounter (HOSPITAL_COMMUNITY): Payer: Self-pay

## 2020-12-19 ENCOUNTER — Emergency Department (HOSPITAL_COMMUNITY)
Admission: EM | Admit: 2020-12-19 | Discharge: 2020-12-19 | Disposition: A | Discharge status: Home / Self Care | Payer: Medicaid Other | Attending: Emergency Medicine | Admitting: Emergency Medicine

## 2020-12-19 DIAGNOSIS — R109 Unspecified abdominal pain: Secondary | ICD-10-CM | POA: Diagnosis not present

## 2020-12-19 DIAGNOSIS — Z5321 Procedure and treatment not carried out due to patient leaving prior to being seen by health care provider: Secondary | ICD-10-CM | POA: Diagnosis not present

## 2020-12-19 DIAGNOSIS — N939 Abnormal uterine and vaginal bleeding, unspecified: Secondary | ICD-10-CM | POA: Insufficient documentation

## 2020-12-19 LAB — GC/CHLAMYDIA PROBE AMP (~~LOC~~) NOT AT ARMC
Chlamydia: POSITIVE — AB
Comment: NEGATIVE
Comment: NORMAL
Neisseria Gonorrhea: NEGATIVE

## 2020-12-19 LAB — URINE CULTURE: Culture: NO GROWTH

## 2020-12-19 LAB — RPR: RPR Ser Ql: NONREACTIVE

## 2020-12-19 NOTE — ED Notes (Signed)
Pt arrived via walk in, c/o right sided flank pain, denies any n/v or diarrhea, states she was here yesterday for same as well as vaginal bleeding. States worsening today.

## 2021-03-06 ENCOUNTER — Telehealth: Payer: Self-pay | Admitting: Physician Assistant

## 2021-03-06 NOTE — Telephone Encounter (Signed)
Received anew hem referral from Dr. Ronnald Ramp at [facility] for secondary polycythemia. Kim Johns has been cld and scheduled to see [name] on 4/15 at 9am. Pt awar to arive 20 minutes early.

## 2021-03-07 ENCOUNTER — Encounter (HOSPITAL_COMMUNITY): Payer: Self-pay

## 2021-03-07 ENCOUNTER — Emergency Department (HOSPITAL_COMMUNITY)
Admission: EM | Admit: 2021-03-07 | Discharge: 2021-03-07 | Disposition: A | Discharge status: Home / Self Care | Payer: Medicaid Other | Attending: Emergency Medicine | Admitting: Emergency Medicine

## 2021-03-07 DIAGNOSIS — W19XXXA Unspecified fall, initial encounter: Secondary | ICD-10-CM | POA: Diagnosis not present

## 2021-03-07 DIAGNOSIS — S79921A Unspecified injury of right thigh, initial encounter: Secondary | ICD-10-CM | POA: Diagnosis present

## 2021-03-07 DIAGNOSIS — R202 Paresthesia of skin: Secondary | ICD-10-CM | POA: Diagnosis not present

## 2021-03-07 DIAGNOSIS — Z87891 Personal history of nicotine dependence: Secondary | ICD-10-CM | POA: Insufficient documentation

## 2021-03-07 DIAGNOSIS — S7011XA Contusion of right thigh, initial encounter: Secondary | ICD-10-CM | POA: Diagnosis not present

## 2021-03-07 DIAGNOSIS — J45909 Unspecified asthma, uncomplicated: Secondary | ICD-10-CM | POA: Diagnosis not present

## 2021-03-07 NOTE — ED Provider Notes (Signed)
[facility] DEPT Provider Note   CSN: [id number] Arrival date & time: 03/07/21  0156     History Chief Complaint  Patient presents with  . Leg Pain    Kim Johns is a 30 y.o. female.  The history is provided by the patient.   Patient presents with bruising to her right thigh.  Patient reports she had an accidental fall over 3 days ago.  She landed on her right thigh and no other injuries.  She has had minimal pain since that time.  However the bruising has been worsening and encompassing most of her lateral aspect of her thigh.  She has only mild pain around the site.  She reports tingling in her legs at times.  She is able to ambulate without difficulty.  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  She is not on anticoagulation    Past Medical History:  Diagnosis Date  . Abscess of breast   . Allergy   . Anxiety   . Asthma    as a child - no inhaler  . Bipolar 1 disorder ([place])   . Depression   . GERD (gastroesophageal reflux disease)    diet control  . HSV-2 (herpes simplex virus 2) infection   . Pelvic inflammatory disease   . Preterm labor   . SVD (spontaneous vaginal delivery)    x 2 - fetal demise at 21 weeks, 1 living    Patient Active Problem List   Diagnosis Date Noted  . Yeast infection 09/24/2018  . 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 Renal pyelectasis, fetal, affecting care of mother, antepartum, fetus 1 08/13/2018  . Asthma   . Anxiety 12/17/2016  . 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 History of intrauterine fetal death, currently pregnant   . History of cervical incompetence in pregnancy, currently pregnant     Past Surgical History:  Procedure Laterality Date  . CERVICAL CERCLAGE N/A 04/05/2015   Procedure: CERCLAGE CERVICAL;  Surgeon: Lavonia Drafts, MD;  Location: [facility];  Service: Gynecology;  Laterality: N/A;  . CERVICAL CERCLAGE N/A 05/18/2018   Procedure:  CERCLAGE CERVICAL;  Surgeon: Mora Bellman, MD;  Location: [facility];  Service: Gynecology;  Laterality: N/A;  . WISDOM TOOTH EXTRACTION       OB History    Gravida  3   Para  3   Term  2   Preterm  1   AB  0   Living  2     SAB  0   IAB  0   Ectopic  0   Multiple  0   Live Births  2           Family History  Problem Relation Age of Onset  . Diabetes Mother   . Stroke Mother   . Hypertension Father   . Heart attack Father   . Heart disease Father     Social History   Tobacco Use  . Smoking status: Former Smoker    Packs/day: 0.50    Years: 10.00    Pack years: 5.00    Types: Cigarettes    Quit date: 03/09/2018    Years since quitting: 2.9  . Smokeless tobacco: Never Used  Vaping Use  . Vaping Use: Never used  Substance Use Topics  . Alcohol use: Not Currently    Alcohol/week: 0.0 standard drinks    Comment: occ but none with pregnancy  . Drug use: No    Home  Medications Prior to Admission medications   Medication Sig Start Date End Date Taking? Authorizing Provider  PYLERA [phone] MG capsule Take 3 capsules by mouth 4 (four) times daily. 02/28/21  Yes [provider]  moxifloxacin (VIGAMOX) 0.5 % ophthalmic solution Place 1 drop into the right eye 3 (three) times daily as needed. stye Patient not taking: Reported on 03/07/2021 12/05/20   [provider]  norgestimate-ethinyl estradiol (ORTHO-CYCLEN) 0.25-35 MG-MCG tablet Take 1 tablet by mouth daily. Patient not taking: No sig reported 09/05/20   Jorje Guild, NP  pantoprazole (PROTONIX) 20 MG tablet Take 1 tablet (20 mg total) by mouth daily. 12/18/20   Henderly, Britni A, PA-C  sucralfate (CARAFATE) 1 g tablet Take 1 tablet (1 g total) by mouth 4 (four) times daily -  with meals and at bedtime for 10 days. 12/18/20 12/28/20  Henderly, Britni A, PA-C  valACYclovir (VALTREX) 1000 MG tablet Take 1 tablet (1,000 mg total) by mouth 2 (two) times daily. Patient not taking: No sig  reported 06/21/20   Rasch, Anderson Malta I, NP    Allergies    Shellfish allergy, Sulfa antibiotics, and Icy hot  Review of Systems   Review of Systems  Constitutional: Negative for fever.  Skin: Positive for color change.    Physical Exam Updated Vital Signs BP (!) 131/92   Pulse 68   Temp 98 F (36.7 C)   Resp 16   Ht 1.6 m (5\' 3" )   Wt 88.5 kg   LMP 03/03/2021   SpO2 98%   BMI 34.54 kg/m   Physical Exam CONSTITUTIONAL: Well developed/well nourished HEAD: Normocephalic/atraumatic EYES: EOMI ENMT: Mucous membranes moist NECK: supple no meningeal signs CV: S1/S2 noted, no murmurs/rubs/gallops noted LUNGS: Lungs are clear to auscultation bilaterally, no apparent distress ABDOMEN: soft, nontender NEURO: Pt is awake/alert/appropriate, moves all extremitiesx4.  No facial droop.   EXTREMITIES: pulses normal/equal, full ROM, bruising/ecchymosis noted to right lateral thigh, no significant tenderness.  No pulsatile mass.  See photo below.  No calf tenderness or edema.  Distal pulses are equal and intact SKIN: warm, color normal, see photo below PSYCH: no abnormalities of mood noted, alert and oriented to situation    ED Results / Procedures / Treatments   Labs (all labs ordered are listed, but only abnormal results are displayed) Labs Reviewed - No data to display  EKG None  Radiology No results found.  Procedures Procedures   Medications Ordered in ED Medications - No data to display  ED Course  I have reviewed the triage vital signs and the nursing notes.    MDM Rules/Calculators/A&P                          Patient presents with bruising and ecchymosis to right thigh after a fall several days ago.  Patient was most concerned because it appears to be worse than the actual injury.  She has no signs of any other traumatic injury.  She is ambulatory.  I Offered further laboratory studies, but patient declines Patient reports she has follow-up with hematology later  this week Final Clinical Impression(s) / ED Diagnoses Final diagnoses:  Traumatic ecchymosis of right thigh, initial encounter    Rx / DC Orders ED Discharge Orders    None       Ripley Fraise, MD 03/07/21 0559

## 2021-03-07 NOTE — ED Triage Notes (Signed)
Pt states she fell on Saturday and has a large bruise on R thigh. Pt ambulatory in triage. Pt reports a recent polycythemia vera diagnosis and has appointment on 4/15

## 2021-03-08 NOTE — Progress Notes (Signed)
[place] [phone]   Fax:([phone] [place] NOTE  Patient Care Team: Pcp, No as PCP - General  Hematological/Oncological History 1) Labs from PCP, Dr. Kristie Cowman -02/11/2021: WBC 7.3, Hgb 16.3 (H), Hct 47.9 (H), MCV 102 (H), Plt 322 -02/28/2021: WBC 6.5, Hgb 15.7 (H), Hct 45 (H), MCV 101 (H), Plt 286  2) 03/09/2021: Establish care with Dede Query PA-C  CHIEF COMPLAINTS/PURPOSE OF CONSULTATION:  "Polycythemia"  HISTORY OF PRESENTING ILLNESS:  Kim Johns 30 y.o. female with medical history significant for GERD, bipolar 1 disorder and tobacco use. Patient is unaccompanied for this visit.   On exam today, Kim Johns reports chronic fatigue.  Patient continues to complete her ADLs on her own but admits that she is resting frequently throughout the day.  Patient denies any changes to her appetite or weight.  She reports intermittent episodes of nausea without any vomiting episodes.  Patient reports ongoing right-sided rib cage pain that is currently undergoing work-up by her PCP.  She was recently tested positive for H. pylori and taking 10-day course of antibiotics and antiacids. She has regular bowel movement with occasional episodes of blood on the toilet paper when she wipes.  Scheduled to see [facility] on 03/14/2021 for further work-up.  Patient reports daily headaches over the past year.  She denies any dizziness, and changes or other focal deficits.  She denies any triggers to the headaches.  Last Saturday, patient tripped and fell to the ground that left a large bruise on her right thigh.  She admits to easy bruising mainly in the upper and lower extremities.  Patient has a persistent cough but denies any fevers chills or shortness of breath.  She has no other complaints and rest of the 10 point ROS is below.  MEDICAL HISTORY:  Past Medical History:  Diagnosis Date  . Abscess of breast   . Allergy   . Anxiety   . Asthma    as a child  - no inhaler  . Bipolar 1 disorder ([place])   . Depression   . GERD (gastroesophageal reflux disease)    diet control  . HSV-2 (herpes simplex virus 2) infection   . Pelvic inflammatory disease   . Preterm labor   . SVD (spontaneous vaginal delivery)    x 2 - fetal demise at 21 weeks, 1 living    SURGICAL HISTORY: Past Surgical History:  Procedure Laterality Date  . CERVICAL CERCLAGE N/A 04/05/2015   Procedure: CERCLAGE CERVICAL;  Surgeon: Lavonia Drafts, MD;  Location: [facility];  Service: Gynecology;  Laterality: N/A;  . CERVICAL CERCLAGE N/A 05/18/2018   Procedure: CERCLAGE CERVICAL;  Surgeon: Mora Bellman, MD;  Location: [facility];  Service: Gynecology;  Laterality: N/A;  . WISDOM TOOTH EXTRACTION      SOCIAL HISTORY: Social History   Socioeconomic History  . Marital status: Single    Spouse name: Not on file  . Number of children: 2  . Years of education: Not on file  . Highest education level: Not on file  Occupational History  . Not on file  Tobacco Use  . Smoking status: Current Every Day Smoker    Types: Cigarettes    Last attempt to quit: 03/09/2018    Years since quitting: 3.0  . Smokeless tobacco: Never Used  . Tobacco comment: 3 cigarettes a day, started when 29 years of age.   Vaping Use  . Vaping Use: Never used  Substance and Sexual Activity  .  Alcohol use: Not Currently    Alcohol/week: 0.0 standard drinks    Comment: occ but none with pregnancy  . Drug use: No  . Sexual activity: Yes    Birth control/protection: None    Comment: none since pregnancy  Other Topics Concern  . Not on file  Social History Narrative  . Not on file   Social Determinants of Health   Financial Resource Strain: Not on file  Food Insecurity: No Food Insecurity  . Worried About Charity fundraiser in the Last Year: Never true  . Ran Out of Food in the Last Year: Never true  Transportation Needs: No Transportation Needs  . Lack of Transportation (Medical): No  .  Lack of Transportation (Non-Medical): No  Physical Activity: Not on file  Stress: Not on file  Social Connections: Not on file  Intimate Partner Violence: Not on file    FAMILY HISTORY: Family History  Problem Relation Age of Onset  . Diabetes Mother   . Stroke Mother   . Hypertension Father   . Heart attack Father   . Heart disease Father   . Prostate cancer Maternal Grandfather   . Cancer Paternal Uncle     ALLERGIES:  is allergic to shellfish allergy, sulfa antibiotics, and icy hot.  MEDICATIONS:  Current Outpatient Medications  Medication Sig Dispense Refill  . Cholecalciferol (VITAMIN D3) 1.25 MG (50000 UT) CAPS Take 1 capsule by mouth once a week.    Marland Kitchen PYLERA 140-125-125 MG capsule Take 3 capsules by mouth 4 (four) times daily.     No current facility-administered medications for this visit.    REVIEW OF SYSTEMS:   Constitutional: ( - ) fevers, ( - )  chills , ( - ) night sweats Eyes: ( - ) blurriness of vision, ( - ) double vision, ( - ) watery eyes Ears, nose, mouth, throat, and face: ( - ) mucositis, ( - ) sore throat Respiratory: ( + ) cough, ( - ) dyspnea, ( - ) wheezes Cardiovascular: ( - ) palpitation, ( - ) chest discomfort, ( - ) lower extremity swelling Gastrointestinal:  ( + ) nausea, ( - ) heartburn, ( - ) change in bowel habits Skin: ( - ) abnormal skin rashes Lymphatics: ( - ) new lymphadenopathy, ( + ) easy bruising Neurological: ( - ) numbness, ( - ) tingling, ( - ) new weaknesses Behavioral/Psych: ( - ) mood change, ( - ) new changes  All other systems were reviewed with the patient and are negative.  PHYSICAL EXAMINATION: ECOG PERFORMANCE STATUS: 1 - Symptomatic but completely ambulatory  Vitals:   03/09/21 0901  BP: 118/77  Pulse: 77  Resp: 16  Temp: 97.9 F (36.6 C)  SpO2: 100%   Filed Weights   03/09/21 0901  Weight: 200 lb 8 oz (90.9 kg)    GENERAL: well appearing African American female in NAD  SKIN: skin color, texture, turgor  are normal, no rashes or significant lesions EYES: conjunctiva are pink and non-injected, sclera clear OROPHARYNX: no exudate, no erythema; lips, buccal mucosa, and tongue normal  NECK: supple, non-tender LYMPH:  no palpable lymphadenopathy in the cervical, axillary or supraclavicular lymph nodes.  LUNGS: clear to auscultation and percussion with normal breathing effort HEART: regular rate & rhythm and no murmurs and no lower extremity edema ABDOMEN: soft, non-tender, non-distended, normal bowel sounds Musculoskeletal: no cyanosis of digits and no clubbing  PSYCH: alert & oriented x 3, fluent speech NEURO: no focal motor/sensory deficits  LABORATORY DATA:  I have reviewed the data as listed CBC Latest Ref Rng & Units 03/09/2021 12/18/2020 03/30/2020  WBC 4.0 - 10.5 K/uL 4.7 8.9 7.1  Hemoglobin 12.0 - 15.0 g/dL 14.9 15.2(H) 15.4  Hematocrit 36.0 - 46.0 % 42.7 42.8 45.1  Platelets 150 - 400 K/uL 242 230 277    CMP Latest Ref Rng & Units 12/18/2020 03/30/2020 10/14/2017  Glucose 70 - 99 mg/dL 93 87 76  BUN 6 - 20 mg/dL 8 9 8   Creatinine 0.44 - 1.00 mg/dL 0.65 0.85 0.88  Sodium 135 - 145 mmol/L 139 142 139  Potassium 3.5 - 5.1 mmol/L 3.8 4.7 3.7  Chloride 98 - 111 mmol/L 109 104 108  CO2 22 - 32 mmol/L 20(L) 25 25  Calcium 8.9 - 10.3 mg/dL 9.2 9.6 9.3  Total Protein 6.5 - 8.1 g/dL 7.3 7.0 -  Total Bilirubin 0.3 - 1.2 mg/dL 0.7 0.3 -  Alkaline Phos 38 - 126 U/L 33(L) 50 -  AST 15 - 41 U/L 14(L) 19 -  ALT 0 - 44 U/L 12 10 -    ASSESSMENT & PLAN Kim Johns is a 30 y.o. female presenting to the clinic for evaluation for polycythemia.   Reviewed etiologies for polycythemia including renal dysfunction, smoking, obstructive sleep apnea, high altitude, malignancy and myleoproliferative disorders. Based on review of records and history/physical, the likely etiology is smoking. Obstructive sleep apnea is a potential etiology, although not diagnosed, since patient reports daily fatigue and  headaches. We will proceed with further workup including lab work to check CBC, CMP, erythropoietin, BCR-ABL and MPN panel.  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 Advised smoking cessation and sleep study to confirm OSA. Sent referral to tobacco cessation program.  --Rule out other causes with labs to check CBC, CMP, erythropoietin, BCR-ABL and MPN panel. --Recommend to start ASA 81 mg daily.  --RTC in 6 months with labs unless above workup requires further intervention.   #Easy bruising: --Evaluate further with labs today to check PT/INR and PTT.    Orders Placed This Encounter  Procedures  . CBC with Differential ([facility]cer Center Only)    Standing Status:   Future    Number of Occurrences:   1    Standing Expiration Date:   03/08/2022  . CMP ([place] only)    Standing Status:   Future    Number of Occurrences:   1    Standing Expiration Date:   03/08/2022  . Erythropoietin    Standing Status:   Future    Number of Occurrences:   1    Standing Expiration Date:   03/08/2022  . JAK2 (INCLUDING V617F AND EXON 12), MPL,& CALR W/RFL MPN PANEL ([facility])    Standing Status:   Future    Number of Occurrences:   1    Standing Expiration Date:   03/08/2022  . BCR ABL1 FISH ([facility])    Standing Status:   Future    Number of Occurrences:   1    Standing Expiration Date:   03/08/2022  . Protime-INR    Standing Status:   Future    Number of Occurrences:   1    Standing Expiration Date:   03/09/2022  . APTT    Standing Status:   Future    Number of Occurrences:   1    Standing Expiration Date:   03/09/2022    All questions were answered. The patient knows to call the clinic with any problems, questions  or concerns.  A total of more than 60 minutes were spent on this encounter and over half of that time was spent on counseling and coordination of care as outlined above.    Dede Query, PA-C Department of Hematology/Oncology [place] at [facility] Phone: [phone]  Patient was seen with Dr. Lorenso Courier.   I have read the above note and personally examined the patient. I agree with the assessment and plan as noted above.  Briefly Kim Johns is a 30 year old female who presents for evaluation of polycythemia.  She was noted to have a hemoglobin of 15.2 on 12/18/2020.  She was seems to run in the high to high normal range with her hemoglobin.  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  Additionally with her large and easy bruising I would recommend that we order a PTT and PT in order to rule out coagulopathy.   Ledell Peoples, MD Department of Hematology/Oncology [place] at [facility] Phone: [phone] Pager: [phone] Email: [email or web address]

## 2021-03-09 ENCOUNTER — Inpatient Hospital Stay: Payer: Medicaid Other

## 2021-03-09 ENCOUNTER — Inpatient Hospital Stay: Payer: Medicaid Other | Attending: Physician Assistant | Admitting: Physician Assistant

## 2021-03-09 ENCOUNTER — Other Ambulatory Visit: Payer: Self-pay

## 2021-03-09 ENCOUNTER — Encounter: Payer: Self-pay | Admitting: Physician Assistant

## 2021-03-09 VITALS — BP 118/77 | HR 77 | Temp 97.9°F | Resp 16 | Ht 63.0 in | Wt 200.5 lb

## 2021-03-09 DIAGNOSIS — Z720 Tobacco use: Secondary | ICD-10-CM

## 2021-03-09 DIAGNOSIS — Z833 Family history of diabetes mellitus: Secondary | ICD-10-CM | POA: Diagnosis not present

## 2021-03-09 DIAGNOSIS — S7011XA Contusion of right thigh, initial encounter: Secondary | ICD-10-CM | POA: Diagnosis not present

## 2021-03-09 DIAGNOSIS — Z823 Family history of stroke: Secondary | ICD-10-CM | POA: Diagnosis not present

## 2021-03-09 DIAGNOSIS — R110 Nausea: Secondary | ICD-10-CM | POA: Diagnosis not present

## 2021-03-09 DIAGNOSIS — R053 Chronic cough: Secondary | ICD-10-CM

## 2021-03-09 DIAGNOSIS — R233 Spontaneous ecchymoses: Secondary | ICD-10-CM

## 2021-03-09 DIAGNOSIS — D751 Secondary polycythemia: Secondary | ICD-10-CM

## 2021-03-09 DIAGNOSIS — R519 Headache, unspecified: Secondary | ICD-10-CM

## 2021-03-09 DIAGNOSIS — W010XXA Fall on same level from slipping, tripping and stumbling without subsequent striking against object, initial encounter: Secondary | ICD-10-CM | POA: Diagnosis not present

## 2021-03-09 DIAGNOSIS — T148XXA Other injury of unspecified body region, initial encounter: Secondary | ICD-10-CM

## 2021-03-09 DIAGNOSIS — K219 Gastro-esophageal reflux disease without esophagitis: Secondary | ICD-10-CM | POA: Diagnosis not present

## 2021-03-09 DIAGNOSIS — Z882 Allergy status to sulfonamides status: Secondary | ICD-10-CM | POA: Diagnosis not present

## 2021-03-09 DIAGNOSIS — R238 Other skin changes: Secondary | ICD-10-CM | POA: Diagnosis not present

## 2021-03-09 DIAGNOSIS — F17210 Nicotine dependence, cigarettes, uncomplicated: Secondary | ICD-10-CM

## 2021-03-09 DIAGNOSIS — Z79899 Other long term (current) drug therapy: Secondary | ICD-10-CM

## 2021-03-09 DIAGNOSIS — Z8042 Family history of malignant neoplasm of prostate: Secondary | ICD-10-CM

## 2021-03-09 DIAGNOSIS — Z809 Family history of malignant neoplasm, unspecified: Secondary | ICD-10-CM

## 2021-03-09 DIAGNOSIS — F319 Bipolar disorder, unspecified: Secondary | ICD-10-CM | POA: Diagnosis not present

## 2021-03-09 DIAGNOSIS — Z8249 Family history of ischemic heart disease and other diseases of the circulatory system: Secondary | ICD-10-CM | POA: Diagnosis not present

## 2021-03-09 LAB — CBC WITH DIFFERENTIAL (CANCER CENTER ONLY)
Abs Immature Granulocytes: 0.03 10*3/uL (ref 0.00–0.07)
Basophils Absolute: 0 10*3/uL (ref 0.0–0.1)
Basophils Relative: 1 %
Eosinophils Absolute: 0.1 10*3/uL (ref 0.0–0.5)
Eosinophils Relative: 1 %
HCT: 42.7 % (ref 36.0–46.0)
Hemoglobin: 14.9 g/dL (ref 12.0–15.0)
Immature Granulocytes: 1 %
Lymphocytes Relative: 29 %
Lymphs Abs: 1.4 10*3/uL (ref 0.7–4.0)
MCH: 34.7 pg — ABNORMAL HIGH (ref 26.0–34.0)
MCHC: 34.9 g/dL (ref 30.0–36.0)
MCV: 99.3 fL (ref 80.0–100.0)
Monocytes Absolute: 0.6 10*3/uL (ref 0.1–1.0)
Monocytes Relative: 12 %
Neutro Abs: 2.6 10*3/uL (ref 1.7–7.7)
Neutrophils Relative %: 56 %
Platelet Count: 242 10*3/uL (ref 150–400)
RBC: 4.3 MIL/uL (ref 3.87–5.11)
RDW: 13.1 % (ref 11.5–15.5)
WBC Count: 4.7 10*3/uL (ref 4.0–10.5)
nRBC: 0 % (ref 0.0–0.2)

## 2021-03-09 LAB — CMP (CANCER CENTER ONLY)
ALT: 13 U/L (ref 0–44)
AST: 19 U/L (ref 15–41)
Albumin: 4.2 g/dL (ref 3.5–5.0)
Alkaline Phosphatase: 37 U/L — ABNORMAL LOW (ref 38–126)
Anion gap: 9 (ref 5–15)
BUN: 12 mg/dL (ref 6–20)
CO2: 24 mmol/L (ref 22–32)
Calcium: 9.4 mg/dL (ref 8.9–10.3)
Chloride: 108 mmol/L (ref 98–111)
Creatinine: 0.85 mg/dL (ref 0.44–1.00)
GFR, Estimated: >60 mL/min (ref >60)
Glucose, Bld: 84 mg/dL (ref 70–99)
Potassium: 4.3 mmol/L (ref 3.5–5.1)
Sodium: 141 mmol/L (ref 135–145)
Total Bilirubin: 0.4 mg/dL (ref 0.3–1.2)
Total Protein: 7.8 g/dL (ref 6.5–8.1)

## 2021-03-09 LAB — APTT: aPTT: 32 seconds (ref 24–36)

## 2021-03-09 LAB — PROTIME-INR
INR: 0.9 (ref 0.8–1.2)
Prothrombin Time: 12.5 seconds (ref 11.4–15.2)

## 2021-03-10 LAB — ERYTHROPOIETIN: Erythropoietin: 21.3 m[IU]/mL — ABNORMAL HIGH (ref 2.6–18.5)

## 2021-03-12 ENCOUNTER — Telehealth: Payer: Self-pay | Admitting: Physician Assistant

## 2021-03-12 NOTE — Telephone Encounter (Signed)
Left message with follow-up appointment per 4/15 los. Gave option to call back to reschedule if needed.

## 2021-03-19 LAB — JAK2 (INCLUDING V617F AND EXON 12), MPL,& CALR W/RFL MPN PANEL (NGS)

## 2021-03-20 ENCOUNTER — Telehealth: Payer: Self-pay | Admitting: Physician Assistant

## 2021-03-20 DIAGNOSIS — D751 Secondary polycythemia: Secondary | ICD-10-CM

## 2021-03-20 NOTE — Telephone Encounter (Signed)
I called [name], to review the lab results from 03/09/2020 due to consultation for polycythemia.  Explained that labs were unremarkable except for mild elevation of erythropoietin level which is not clinically significant at this time.  MPN panel was unremarkable.  The likely cause of polycythemia is smoking +/- obstructive sleep apnea.  Advised patient to smoking cessation and proceed with sleep study to confirm obstructive sleep apnea. In addition, I advised patient to take ASA 81 mg daily. I will plan to see the patient back in clinic in 6 months to repeat labs.    Patient expressed understanding and satisfaction with the plan provided. She is aware to return to the clinic if symptoms worsen or new symptoms present.

## 2021-03-23 DIAGNOSIS — D105 Benign neoplasm of other parts of oropharynx: Secondary | ICD-10-CM | POA: Insufficient documentation

## 2021-05-03 NOTE — Telephone Encounter (Signed)
Opened in error

## 2021-06-20 ENCOUNTER — Encounter (HOSPITAL_BASED_OUTPATIENT_CLINIC_OR_DEPARTMENT_OTHER): Payer: Medicaid Other | Admitting: Internal Medicine

## 2021-09-05 ENCOUNTER — Encounter (HOSPITAL_BASED_OUTPATIENT_CLINIC_OR_DEPARTMENT_OTHER): Payer: Medicaid Other | Admitting: Internal Medicine

## 2021-09-06 ENCOUNTER — Other Ambulatory Visit: Payer: Self-pay | Admitting: Physician Assistant

## 2021-09-06 DIAGNOSIS — D751 Secondary polycythemia: Secondary | ICD-10-CM

## 2021-09-07 ENCOUNTER — Inpatient Hospital Stay: Payer: Medicaid Other | Attending: Physician Assistant | Admitting: Physician Assistant

## 2021-09-07 ENCOUNTER — Inpatient Hospital Stay: Payer: Medicaid Other

## 2021-10-31 ENCOUNTER — Ambulatory Visit (HOSPITAL_BASED_OUTPATIENT_CLINIC_OR_DEPARTMENT_OTHER): Payer: Medicaid Other | Admitting: Internal Medicine

## 2021-11-25 HISTORY — PX: HEMORRHOID BANDING: SHX5850

## 2021-12-19 ENCOUNTER — Ambulatory Visit (HOSPITAL_BASED_OUTPATIENT_CLINIC_OR_DEPARTMENT_OTHER): Payer: Medicaid Other | Attending: Physician Assistant | Admitting: Internal Medicine

## 2022-01-24 ENCOUNTER — Encounter (HOSPITAL_COMMUNITY): Payer: Self-pay | Admitting: Obstetrics & Gynecology

## 2022-01-24 ENCOUNTER — Inpatient Hospital Stay (HOSPITAL_COMMUNITY)
Admission: AD | Admit: 2022-01-24 | Discharge: 2022-01-25 | Disposition: A | Discharge status: Home / Self Care | Payer: Medicaid Other | Attending: Obstetrics & Gynecology | Admitting: Obstetrics & Gynecology

## 2022-01-24 DIAGNOSIS — O209 Hemorrhage in early pregnancy, unspecified: Secondary | ICD-10-CM

## 2022-01-24 DIAGNOSIS — O3680X0 Pregnancy with inconclusive fetal viability, not applicable or unspecified: Secondary | ICD-10-CM | POA: Diagnosis not present

## 2022-01-24 DIAGNOSIS — O26891 Other specified pregnancy related conditions, first trimester: Secondary | ICD-10-CM | POA: Diagnosis not present

## 2022-01-24 DIAGNOSIS — Z3A01 Less than 8 weeks gestation of pregnancy: Secondary | ICD-10-CM

## 2022-01-24 NOTE — MAU Note (Addendum)
Found out I was pregnant couple wks ago. Today I have been spotting and having abdominal cramping. LMP 12/18/21. Pt instructed in obtaining vag swabs which she did without difficulty before returning to lobby ?

## 2022-01-25 ENCOUNTER — Inpatient Hospital Stay (HOSPITAL_COMMUNITY): Payer: Medicaid Other

## 2022-01-25 ENCOUNTER — Encounter: Payer: Self-pay | Admitting: Student

## 2022-01-25 DIAGNOSIS — Z3A01 Less than 8 weeks gestation of pregnancy: Secondary | ICD-10-CM

## 2022-01-25 DIAGNOSIS — O3680X0 Pregnancy with inconclusive fetal viability, not applicable or unspecified: Secondary | ICD-10-CM

## 2022-01-25 DIAGNOSIS — O209 Hemorrhage in early pregnancy, unspecified: Secondary | ICD-10-CM

## 2022-01-25 LAB — URINALYSIS, ROUTINE W REFLEX MICROSCOPIC
Bacteria, UA: NONE SEEN
Bilirubin Urine: NEGATIVE
Glucose, UA: NEGATIVE mg/dL
Ketones, ur: NEGATIVE mg/dL
Leukocytes,Ua: NEGATIVE
Nitrite: NEGATIVE
Protein, ur: NEGATIVE mg/dL
Specific Gravity, Urine: 1.024 (ref 1.005–1.030)
pH: 6 (ref 5.0–8.0)

## 2022-01-25 LAB — HCG, QUANTITATIVE, PREGNANCY: hCG, Beta Chain, Quant, S: 226 m[IU]/mL — ABNORMAL HIGH (ref <5)

## 2022-01-25 LAB — CBC
HCT: 38.9 % (ref 36.0–46.0)
Hemoglobin: 14 g/dL (ref 12.0–15.0)
MCH: 35.1 pg — ABNORMAL HIGH (ref 26.0–34.0)
MCHC: 36 g/dL (ref 30.0–36.0)
MCV: 97.5 fL (ref 80.0–100.0)
Platelets: 266 10*3/uL (ref 150–400)
RBC: 3.99 MIL/uL (ref 3.87–5.11)
RDW: 12.8 % (ref 11.5–15.5)
WBC: 7.9 10*3/uL (ref 4.0–10.5)
nRBC: 0 % (ref 0.0–0.2)

## 2022-01-25 LAB — GC/CHLAMYDIA PROBE AMP (~~LOC~~) NOT AT ARMC
Chlamydia: NEGATIVE
Comment: NEGATIVE
Comment: NORMAL
Neisseria Gonorrhea: NEGATIVE

## 2022-01-25 LAB — WET PREP, GENITAL
Sperm: NONE SEEN
Trich, Wet Prep: NONE SEEN
WBC, Wet Prep HPF POC: <10 (ref <10)
Yeast Wet Prep HPF POC: NONE SEEN

## 2022-01-25 NOTE — MAU Provider Note (Addendum)
?History  ?  ? ?[id number] ? ?Arrival date and time: 01/24/22 2310 ?  ? ?Chief Complaint  ?Patient presents with  ? Abdominal Pain  ? Vaginal Bleeding  ? ? ? ?HPI ?Kim Johns is a 31 y.o. at [redacted]w[redacted]d by LMP who presents for vaginal bleeding.  Reports pregnancy confirmation at [facility] on February 20.  Reports pink/red spotting that started today.  Also has had some mild lower abdominal cramping.  Denies vomiting, diarrhea, constipation, dysuria, vaginal discharge, or vaginal irritation.  No recent intercourse. ? ?OB History   ? ? Gravida  ?4  ? Para  ?3  ? Term  ?2  ? Preterm  ?1  ? AB  ?0  ? Living  ?2  ?  ? ? SAB  ?0  ? IAB  ?0  ? Ectopic  ?0  ? Multiple  ?0  ? Live Births  ?2  ?   ?  ?  ? ? ?Past Medical History:  ?Diagnosis Date  ? Abscess of breast   ? Allergy   ? Anxiety   ? Asthma   ? as a child - no inhaler  ? Bipolar 1 disorder ([place])   ? Depression   ? GERD (gastroesophageal reflux disease)   ? diet control  ? HSV-2 (herpes simplex virus 2) infection   ? Pelvic inflammatory disease   ? Preterm labor   ? SVD (spontaneous vaginal delivery)   ? x 2 - fetal demise at 61 weeks, 1 living  ? ? ?Past Surgical History:  ?Procedure Laterality Date  ? CERVICAL CERCLAGE N/A 04/05/2015  ? Procedure: CERCLAGE CERVICAL;  Surgeon: Lavonia Drafts, MD;  Location: [facility];  Service: Gynecology;  Laterality: N/A;  ? CERVICAL CERCLAGE N/A 05/18/2018  ? Procedure: CERCLAGE CERVICAL;  Surgeon: Mora Bellman, MD;  Location: [facility];  Service: Gynecology;  Laterality: N/A;  ? WISDOM TOOTH EXTRACTION    ? ? ?Family History  ?Problem Relation Age of Onset  ? Diabetes Mother   ? Stroke Mother   ? Hypertension Father   ? Heart attack Father   ? Heart disease Father   ? Prostate cancer Maternal Grandfather   ? Cancer Paternal Uncle   ? ? ?Allergies  ?Allergen Reactions  ? Shellfish Allergy Hives  ? Sulfa Antibiotics Other (See Comments)  ?  Reaction:  Unknown; childhood reaction  ? Icy Hot Rash  ? ? ?No current  facility-administered medications on file prior to encounter.  ? ?Current Outpatient Medications on File Prior to Encounter  ?Medication Sig Dispense Refill  ? Cholecalciferol (VITAMIN D3) 1.25 MG (50000 UT) CAPS Take 1 capsule by mouth once a week.    ? ? ? ?ROS ?Pertinent positives and negative per HPI, all others reviewed and negative ? ?Physical Exam  ? ?BP 123/73 (BP Location: Right Arm)   Pulse 77   Temp 98.4 ?F (36.9 ?C)   Resp 17   Ht 5\' 3"  (1.6 m)   Wt 99.8 kg   LMP 12/18/2021   SpO2 98%   BMI 38.97 kg/m?  ? ?Patient Vitals for the past 24 hrs: ? BP Temp Pulse Resp SpO2 Height Weight  ?01/25/22 0206 123/73 -- 77 17 -- -- --  ?01/24/22 2356 123/70 98.4 ?F (36.9 ?C) 89 17 98 % 5\' 3"  (1.6 m) 99.8 kg  ? ? ?Physical Exam ?Vitals and nursing note reviewed.  ?Constitutional:   ?   General: She is not in acute distress. ?   Appearance: She  is well-developed.  ?HENT:  ?   Head: Normocephalic and atraumatic.  ?Eyes:  ?   General: No scleral icterus. ?   Pupils: Pupils are equal, round, and reactive to light.  ?Pulmonary:  ?   Effort: Pulmonary effort is normal. No respiratory distress.  ?Neurological:  ?   General: No focal deficit present.  ?   Mental Status: She is alert.  ?Psychiatric:     ?   Mood and Affect: Mood normal.     ?   Behavior: Behavior normal.  ?  ? ?Labs ?Results for orders placed or performed during the hospital encounter of 01/24/22 (from the past 24 hour(s))  ?CBC     Status: Abnormal  ? Collection Time: 01/25/22 12:17 AM  ?Result Value Ref Range  ? WBC 7.9 4.0 - 10.5 K/uL  ? RBC 3.99 3.87 - 5.11 MIL/uL  ? Hemoglobin 14.0 12.0 - 15.0 g/dL  ? HCT 38.9 36.0 - 46.0 %  ? MCV 97.5 80.0 - 100.0 fL  ? MCH 35.1 (H) 26.0 - 34.0 pg  ? MCHC 36.0 30.0 - 36.0 g/dL  ? RDW 12.8 11.5 - 15.5 %  ? Platelets 266 150 - 400 K/uL  ? nRBC 0.0 0.0 - 0.2 %  ?hCG, quantitative, pregnancy     Status: Abnormal  ? Collection Time: 01/25/22 12:17 AM  ?Result Value Ref Range  ? hCG, Beta Chain, Quant, S 226 (H) <5  mIU/mL  ?Urinalysis, Routine w reflex microscopic Urine, Clean Catch     Status: Abnormal  ? Collection Time: 01/25/22 12:27 AM  ?Result Value Ref Range  ? Color, Urine YELLOW YELLOW  ? APPearance CLEAR CLEAR  ? Specific Gravity, Urine 1.024 1.005 - 1.030  ? pH 6.0 5.0 - 8.0  ? Glucose, UA NEGATIVE NEGATIVE mg/dL  ? Hgb urine dipstick LARGE (A) NEGATIVE  ? Bilirubin Urine NEGATIVE NEGATIVE  ? Ketones, ur NEGATIVE NEGATIVE mg/dL  ? Protein, ur NEGATIVE NEGATIVE mg/dL  ? Nitrite NEGATIVE NEGATIVE  ? Leukocytes,Ua NEGATIVE NEGATIVE  ? RBC / HPF 21-50 0 - 5 RBC/hpf  ? WBC, UA 0-5 0 - 5 WBC/hpf  ? Bacteria, UA NONE SEEN NONE SEEN  ? Squamous Epithelial / LPF 0-5 0 - 5  ? Mucus PRESENT   ? Ca Oxalate Crys, UA PRESENT   ? ? ?Imaging ?US OB LESS THAN 14 WEEKS WITH OB TRANSVAGINAL ? ?Result Date: 01/25/2022 ?CLINICAL DATA:  Vaginal bleeding, pelvic cramping, positive pregnancy test. LMP 12/18/2021 EXAM: OBSTETRIC <14 WK [place] AND TRANSVAGINAL OB US TECHNIQUE: Both transabdominal and transvaginal ultrasound examinations were performed for complete evaluation of the gestation as well as the maternal uterus, adnexal regions, and pelvic cul-de-sac. Transvaginal technique was performed to assess early pregnancy. COMPARISON:  None. FINDINGS: Intrauterine gestational sac: Present, single Yolk sac:  Not identified Embryo:  Not identified Cardiac Activity: Not applicable MSD: 3 mm   5 w   0 d CRL: Not applicable Subchorionic hemorrhage:  None visualized. Maternal uterus/adnexae: The uterus is retroverted. Corpus luteum noted within the right ovary. The ovaries are otherwise unremarkable. Trace simple appearing free fluid is seen within the pelvis. IMPRESSION: Intrauterine gestational sac identified. Nonvisualization of the yolk sac roughly estimates the gestational between 5.0 and 5.5 weeks. Follow-up sonography is recommended in 10-14 days to document appropriate progression. No acute abnormality. Electronically Signed   By: Fidela Salisbury M.D.   On: 01/25/2022 01:43   ? ?MAU Course  ?Procedures ? ?Lab Orders    ?  Wet prep, genital    ?     CBC    ?     hCG, quantitative, pregnancy    ?     Urinalysis, Routine w reflex microscopic Urine, Clean Catch    ?No orders of the defined types were placed in this encounter. ? ? ?Imaging Orders    ?     US OB LESS THAN 14 WEEKS WITH OB TRANSVAGINAL    ? ?MDM ?Labs & imaging ordered and reviewed.  ?Ultrasound shows ?empty intrauterine gestational sac. No adnexal mass.  ?HCG today is 226.  ?She is RH positive ? ?Reviewed records in care everywhere. On 2/20 at [facility], she had a positive urine pregnancy test. On 2/7 at [facility], she had negative gonorrhea & chlamydia.  ? ?Will bring back to MAU on Sunday for repeat HCG due to pregnancy of unknown location ?Assessment and Plan  ? ?1. Pregnancy of unknown anatomic location   ?2. Vaginal bleeding affecting early pregnancy   ?3. [redacted] weeks gestation of pregnancy   ? ?-return to MAU Sunday for stat HCG ?-reviewed miscarriage & ectopic precautions ? ?Jorje Guild, NP ?01/25/22 ?4:53 AM ? ? ?

## 2022-01-25 NOTE — Progress Notes (Addendum)
Jorje Guild NP in earlier to discuss d/c plan with pt ?

## 2022-01-25 NOTE — Discharge Instructions (Signed)
Return to care  If you have heavier bleeding that soaks through more than 2 pads per hour for an hour or more If you bleed so much that you feel like you might pass out or you do pass out If you have significant abdominal pain that is not improved with Tylenol   

## 2022-01-27 ENCOUNTER — Other Ambulatory Visit (HOSPITAL_COMMUNITY): Admit: 2022-01-27 | Payer: Medicaid Other

## 2022-01-27 ENCOUNTER — Inpatient Hospital Stay (HOSPITAL_COMMUNITY)
Admission: AD | Admit: 2022-01-27 | Discharge: 2022-01-27 | Disposition: A | Discharge status: Home / Self Care | Payer: Medicaid Other | Attending: Obstetrics and Gynecology | Admitting: Obstetrics and Gynecology

## 2022-01-27 ENCOUNTER — Other Ambulatory Visit: Payer: Self-pay

## 2022-01-27 ENCOUNTER — Inpatient Hospital Stay (HOSPITAL_COMMUNITY): Payer: Medicaid Other

## 2022-01-27 DIAGNOSIS — Z3A01 Less than 8 weeks gestation of pregnancy: Secondary | ICD-10-CM

## 2022-01-27 DIAGNOSIS — O039 Complete or unspecified spontaneous abortion without complication: Secondary | ICD-10-CM | POA: Diagnosis present

## 2022-01-27 LAB — HCG, QUANTITATIVE, PREGNANCY: hCG, Beta Chain, Quant, S: 213 m[IU]/mL — ABNORMAL HIGH (ref <5)

## 2022-01-27 NOTE — MAU Note (Signed)
Pt reports to mau for follow up hcg labs.  Pt reports bleeding increased last night and has been passing quarter sized clots.  Rating lower abd pain at 5/10.  ?

## 2022-01-27 NOTE — MAU Provider Note (Signed)
History  ? ?Chief Complaint:  Follow-up ? ? ?Kim Johns is  31 y.o. [phone] Patient's last menstrual period was 12/18/2021.Marland Kitchen Patient is here for follow up of quantitative HCG and ongoing surveillance of pregnancy status. She is 24w5dweeks gestation  by LMP.   ? ?Since her last visit, the patient is with new complaint. The patient reports bleeding as  heavier than period.  She reports an increase in lower abdominal cramping. She rates the pain a 7/10.  ? ?General ROS:  positive for abdominal pain and vaginal bleeding ? ?Her previous Quantitative HCG values are: ? Latest Reference Range & Units 01/25/22 00:17  ?HCG, Beta Chain, Quant, S <5 mIU/mL 226 (H)  ?(H): Data is abnormally high ? ?Physical Exam  ? ?Blood pressure 132/80, pulse 80, temperature 98.3 ?F (36.8 ?C), temperature source Oral, resp. rate 16, last menstrual period 12/18/2021, SpO2 99 %. ? ?Physical Exam ?Vitals and nursing note reviewed.  ?Constitutional:   ?   General: She is not in acute distress. ?   Appearance: She is well-developed.  ?HENT:  ?   Head: Normocephalic.  ?Eyes:  ?   Pupils: Pupils are equal, round, and reactive to light.  ?Cardiovascular:  ?   Rate and Rhythm: Normal rate and regular rhythm.  ?Pulmonary:  ?   Effort: Pulmonary effort is normal. No respiratory distress.  ?   Breath sounds: Normal breath sounds.  ?Abdominal:  ?   Palpations: Abdomen is soft.  ?   Tenderness: There is no abdominal tenderness.  ?Musculoskeletal:     ?   General: Normal range of motion.  ?   Cervical back: Normal range of motion.  ?Skin: ?   General: Skin is warm and dry.  ?Neurological:  ?   Mental Status: She is alert and oriented to person, place, and time.  ?Psychiatric:     ?   Behavior: Behavior normal.     ?   Thought Content: Thought content normal.     ?   Judgment: Judgment normal.  ? ?Labs: ?Results for orders placed or performed during the hospital encounter of 01/27/22 (from the past 24 hour(s))  ?hCG, quantitative, pregnancy  ? Collection  Time: 01/27/22 10:26 AM  ?Result Value Ref Range  ? hCG, Beta Chain, Quant, S 213 (H) <5 mIU/mL  ? ? ?Ultrasound Studies:   ?[place] Transvaginal ? ?Addendum Date: 01/27/2022   ?ADDENDUM REPORT: 01/27/2022 12:38 ADDENDUM: Typographical error in the original dictation. Correction of the findings is as follows. FINDINGS: Intrauterine gestational sac: None Yolk sac:  Not visualized Embryo:  Not visualized Cardiac Activity: Not visualized Subchorionic hemorrhage:  None visualized. Maternal uterus/adnexae: Normal bilateral ovaries. Small amount of free fluid around the right ovary. Electronically Signed   By: HKathreen DevoidM.D.   On: 01/27/2022 12:38  ? ?Result Date: 01/27/2022 ?CLINICAL DATA:  Vaginal bleeding EXAM: OBSTETRIC <14 WK ULTRASOUND TECHNIQUE: Transabdominal ultrasound was performed for evaluation of the gestation as well as the maternal uterus and adnexal regions. COMPARISON:  None. FINDINGS: Intrauterine gestational sac: None Yolk sac:  Visualized. Embryo:  Visualized. Cardiac Activity: Visualized. Subchorionic hemorrhage:  None visualized. Maternal uterus/adnexae: Normal bilateral ovaries. Small amount of free fluid around the right ovary. IMPRESSION: 1. No intrauterine pregnancy identified. Previously seen intrauterine gestational sac is no longer present. Overall findings are most consistent with missed abortion. Electronically Signed: By: HKathreen DevoidM.D. On: 01/27/2022 12:26  ? ?[place] LESS THAN 14 WEEKS WITH OB TRANSVAGINAL ? ?Result Date:  01/25/2022 ?CLINICAL DATA:  Vaginal bleeding, pelvic cramping, positive pregnancy test. LMP 12/18/2021 EXAM: OBSTETRIC <14 WK [place] AND TRANSVAGINAL OB US TECHNIQUE: Both transabdominal and transvaginal ultrasound examinations were performed for complete evaluation of the gestation as well as the maternal uterus, adnexal regions, and pelvic cul-de-sac. Transvaginal technique was performed to assess early pregnancy. COMPARISON:  None. FINDINGS: Intrauterine gestational sac:  Present, single Yolk sac:  Not identified Embryo:  Not identified Cardiac Activity: Not applicable MSD: 3 mm   5 w   0 d CRL: Not applicable Subchorionic hemorrhage:  None visualized. Maternal uterus/adnexae: The uterus is retroverted. Corpus luteum noted within the right ovary. The ovaries are otherwise unremarkable. Trace simple appearing free fluid is seen within the pelvis. IMPRESSION: Intrauterine gestational sac identified. Nonvisualization of the yolk sac roughly estimates the gestational between 5.0 and 5.5 weeks. Follow-up sonography is recommended in 10-14 days to document appropriate progression. No acute abnormality. Electronically Signed   By: Fidela Salisbury M.D.   On: 01/25/2022 01:43   ? ?Assessment: ?  ?1. Miscarriage   ?2. [redacted] weeks gestation of pregnancy   ?  ?-Discussed results with patient. Patient verbalized understanding and will follow up as recommended ? ?Plan: ?-Discharge home in stable condition ?-Vaginal bleeding and abdominal pain precautions discussed ?-Patient advised to follow-up with [facility] for SAB follow up, message sent ?-Patient may return to MAU as needed or if her condition were to change or worsen ? ?Wende Mott, CNM ?01/27/2022, 1:09 PM ? ?

## 2022-12-18 ENCOUNTER — Other Ambulatory Visit: Payer: Self-pay | Admitting: Physician Assistant

## 2022-12-18 DIAGNOSIS — H93A2 Pulsatile tinnitus, left ear: Secondary | ICD-10-CM

## 2023-01-12 ENCOUNTER — Ambulatory Visit
Admission: RE | Admit: 2023-01-12 | Discharge: 2023-01-12 | Disposition: A | Discharge status: Home / Self Care | Payer: Medicaid Other | Source: Ambulatory Visit | Attending: Physician Assistant | Admitting: Physician Assistant

## 2023-01-12 ENCOUNTER — Other Ambulatory Visit: Payer: Medicaid Other

## 2023-01-12 DIAGNOSIS — H93A2 Pulsatile tinnitus, left ear: Secondary | ICD-10-CM

## 2023-01-12 MED ORDER — GADOPICLENOL 0.5 MMOL/ML IV SOLN
9.0000 mL | Freq: Once | INTRAVENOUS | Status: AC | PRN
  Administered 2023-01-12: 9 mL via INTRAVENOUS

## 2023-07-09 ENCOUNTER — Inpatient Hospital Stay (HOSPITAL_COMMUNITY)
Admission: AD | Admit: 2023-07-09 | Discharge: 2023-07-10 | Disposition: A | Discharge status: Home / Self Care | Payer: Medicaid Other | Attending: Obstetrics and Gynecology | Admitting: Obstetrics and Gynecology

## 2023-07-09 DIAGNOSIS — F17210 Nicotine dependence, cigarettes, uncomplicated: Secondary | ICD-10-CM | POA: Insufficient documentation

## 2023-07-09 DIAGNOSIS — Z3A00 Weeks of gestation of pregnancy not specified: Secondary | ICD-10-CM | POA: Diagnosis not present

## 2023-07-09 DIAGNOSIS — O4690 Antepartum hemorrhage, unspecified, unspecified trimester: Secondary | ICD-10-CM | POA: Insufficient documentation

## 2023-07-09 DIAGNOSIS — O26899 Other specified pregnancy related conditions, unspecified trimester: Secondary | ICD-10-CM

## 2023-07-09 DIAGNOSIS — O26851 Spotting complicating pregnancy, first trimester: Secondary | ICD-10-CM | POA: Diagnosis not present

## 2023-07-09 DIAGNOSIS — R109 Unspecified abdominal pain: Secondary | ICD-10-CM | POA: Diagnosis not present

## 2023-07-09 DIAGNOSIS — Z3A01 Less than 8 weeks gestation of pregnancy: Secondary | ICD-10-CM

## 2023-07-09 DIAGNOSIS — O3680X0 Pregnancy with inconclusive fetal viability, not applicable or unspecified: Secondary | ICD-10-CM | POA: Diagnosis not present

## 2023-07-09 DIAGNOSIS — O26891 Other specified pregnancy related conditions, first trimester: Secondary | ICD-10-CM | POA: Diagnosis not present

## 2023-07-09 LAB — POCT PREGNANCY, URINE: Preg Test, Ur: NEGATIVE

## 2023-07-09 NOTE — MAU Note (Signed)
..  Kim Johns is a 32 y.o. at Unknown here in MAU reporting:  Had two positive tests one two weeks ago and two today.  Reports abdominal cramping. Has not taken anything for pain.  Had an abortion in May, had a negative pregnancy test at her followup. Has had intermittent vaginal bleeding since then.   Pain score: 3/10 Vitals:   07/09/23 2318  BP: 132/85  Pulse: 79  Resp: 19  Temp: 98.6 F (37 C)  SpO2: 98%     FHT:n/a Lab orders placed from triage:  POCT preg

## 2023-07-09 NOTE — MAU Provider Note (Signed)
Chief Complaint: Possible Pregnancy   Event Date/Time   First Provider Initiated Contact with Patient 07/09/23 2325        SUBJECTIVE HPI: Kim Johns is a 32 y.o. [id number] at Unknown by LMP who presents to maternity admissions reporting spotting and cramping with positive pregnancy test at home.  Had an abortion in May but has not been using contraception.  States pills made her bleed and IUD gave her PID. Does not want to be pregnant.  History of incompetent cervix.. She denies vaginal bleeding, vaginal itching/burning, urinary symptoms, h/a, dizziness, n/v, or fever/chills.    Possible Pregnancy This is a new problem. Associated symptoms include abdominal pain (cramps). Pertinent negatives include no chills, fever, headaches or myalgias. Nothing aggravates the symptoms. She has tried nothing for the symptoms.   RN note: Kim Johns is a 32 y.o. at Unknown here in MAU reporting:  Had two positive tests one two weeks ago and two today.  Reports abdominal cramping. Has not taken anything for pain.  Had an abortion in May, had a negative pregnancy test at her followup. Has had intermittent vaginal bleeding since then  Past Medical History:  Diagnosis Date   Abscess of breast    Anxiety    Asthma    as a child - no inhaler   Bipolar 1 disorder (HCC)    Depression    GERD (gastroesophageal reflux disease)    diet control   HSV-2 (herpes simplex virus 2) infection    Pelvic inflammatory disease    Preterm labor    Past Surgical History:  Procedure Laterality Date   CERVICAL CERCLAGE N/A 04/05/2015   Procedure: CERCLAGE CERVICAL;  Surgeon: Willodean Rosenthal, MD;  Location: [facility];  Service: Gynecology;  Laterality: N/A;   CERVICAL CERCLAGE N/A 05/18/2018   Procedure: CERCLAGE CERVICAL;  Surgeon: Catalina Antigua, MD;  Location: [facility];  Service: Gynecology;  Laterality: N/A;   WISDOM TOOTH EXTRACTION     Social History   Socioeconomic History   Marital status: Single     Spouse name: Not on file   Number of children: 2   Years of education: Not on file   Highest education level: Not on file  Occupational History   Not on file  Tobacco Use   Smoking status: Every Day    Current packs/day: 0.00    Types: Cigarettes    Last attempt to quit: 03/09/2018    Years since quitting: 5.3   Smokeless tobacco: Never   Tobacco comments:    3 cigarettes a day, started when 32 years of age.   Vaping Use   Vaping status: Never Used  Substance and Sexual Activity   Alcohol use: Not Currently    Alcohol/week: 0.0 standard drinks of alcohol    Comment: occ but none with pregnancy   Drug use: No   Sexual activity: Yes    Birth control/protection: None    Comment: none since pregnancy  Other Topics Concern   Not on file  Social History Narrative   Not on file   Social Determinants of Health   Financial Resource Strain: Low Risk  (10/04/2019)   Overall Financial Resource Strain (CARDIA)    Difficulty of Paying Living Expenses: Not hard at all  Food Insecurity: No Food Insecurity (09/05/2020)   Hunger Vital Sign    Worried About Running Out of Food in the Last Year: Never true    Ran Out of Food in the Last Year: Never true  Transportation Needs:  No Transportation Needs (09/05/2020)   PRAPARE - Administrator, Civil Service (Medical): No    Lack of Transportation (Non-Medical): No  Physical Activity: Not on file  Stress: Not on file  Social Connections: Unknown (10/04/2019)   Social Connection and Isolation Panel [NHANES]    Frequency of Communication with Friends and Family: Not on file    Frequency of Social Gatherings with Friends and Family: Not on file    Attends Religious Services: Not on file    Active Member of Clubs or Organizations: No    Attends Banker Meetings: Never    Marital Status: Never married  Intimate Partner Violence: Not At Risk (10/04/2019)   Humiliation, Afraid, Rape, and Kick questionnaire    Fear of  Current or Ex-Partner: No    Emotionally Abused: No    Physically Abused: No    Sexually Abused: No   No current facility-administered medications on file prior to encounter.   Current Outpatient Medications on File Prior to Encounter  Medication Sig Dispense Refill   Cholecalciferol (VITAMIN D3) 1.25 MG (50000 UT) CAPS Take 1 capsule by mouth once a week.     valACYclovir (VALTREX) 500 MG tablet Take 500 mg by mouth 2 (two) times daily.     Allergies  Allergen Reactions   Shellfish Allergy Hives   Sulfa Antibiotics Other (See Comments)    Reaction:  Unknown; childhood reaction   Icy Hot Rash    I have reviewed patient's Past Medical Hx, Surgical Hx, Family Hx, Social Hx, medications and allergies.   ROS:  Review of Systems  Constitutional:  Negative for chills and fever.  Gastrointestinal:  Positive for abdominal pain (cramps).  Musculoskeletal:  Negative for myalgias.  Neurological:  Negative for headaches.   Review of Systems  Other systems negative   Physical Exam  Physical Exam Patient Vitals for the past 24 hrs:  BP Temp Temp src Pulse Resp SpO2 Height Weight  07/09/23 2318 132/85 98.6 F (37 C) Oral 79 19 98 % 5\' 3"  (1.6 m) 98.3 kg   Constitutional: Well-developed, well-nourished female in no acute distress.  Cardiovascular: normal rate Respiratory: normal effort GI: Abd soft, non-tender.  MS: Extremities nontender, no edema, normal ROM Neurologic: Alert and oriented x 4.  GU: Neg CVAT.  LAB RESULTS Results for orders placed or performed during the hospital encounter of 07/09/23 (from the past 24 hour(s))  Pregnancy, urine POC     Status: None   Collection Time: 07/09/23 11:16 PM  Result Value Ref Range   Preg Test, Ur NEGATIVE NEGATIVE  CBC     Status: Abnormal   Collection Time: 07/10/23 12:14 AM  Result Value Ref Range   WBC 6.7 4.0 - 10.5 K/uL   RBC 4.75 3.87 - 5.11 MIL/uL   Hemoglobin 15.6 (H) 12.0 - 15.0 g/dL   HCT 32.4 40.1 - 02.7 %   MCV  95.2 80.0 - 100.0 fL   MCH 32.8 26.0 - 34.0 pg   MCHC 34.5 30.0 - 36.0 g/dL   RDW 25.3 66.4 - 40.3 %   Platelets 313 150 - 400 K/uL   nRBC 0.0 0.0 - 0.2 %  hCG, quantitative, pregnancy     Status: Abnormal   Collection Time: 07/10/23 12:14 AM  Result Value Ref Range   hCG, Beta Chain, Quant, S 17 (H) <5 mIU/mL        IMAGING No results found.  MAU Management/MDM: I have reviewed the triage vital signs  and the nursing notes.   Pertinent labs & imaging results that were available during my care of the patient were reviewed by me and considered in my medical decision making (see chart for details).      I have reviewed her medical records including past results, notes and treatments. Medical, Surgical, and family history were reviewed.  Medications and recent lab tests were reviewed  Ordered usual first trimester r/o ectopic labs.      Treatments in MAU included none.   This bleeding/pain can represent a normal pregnancy with bleeding, spontaneous abortion or even an ectopic which can be life-threatening.  The process as listed above helps to determine which of these is present.  Discussed HCG level is 17.  We recommend repeating in 48 hours to see if it rises.  If it does not, will have her go to [facility] for MD consultation regarding contraceptive options after miscarriage completes.  States she may want her tubes tied but is not sure.   ASSESSMENT Pregnancy at early gestation Pregnancy of unknown location Spotting and cramping  PLAN Discharge home Plan to repeat HCG level in 48 hours in MAU Saturday.   Ectopic precautions  Pt stable at time of discharge. Encouraged to return here if she develops worsening of symptoms, increase in pain, fever, or other concerning symptoms.    Wynelle Bourgeois CNM, MSN Certified Nurse-Midwife 07/09/2023  11:25 PM

## 2023-07-10 ENCOUNTER — Encounter: Payer: Self-pay | Admitting: Advanced Practice Midwife

## 2023-07-10 DIAGNOSIS — Z3A01 Less than 8 weeks gestation of pregnancy: Secondary | ICD-10-CM

## 2023-07-10 DIAGNOSIS — O26891 Other specified pregnancy related conditions, first trimester: Secondary | ICD-10-CM | POA: Diagnosis not present

## 2023-07-10 DIAGNOSIS — O26851 Spotting complicating pregnancy, first trimester: Secondary | ICD-10-CM

## 2023-07-10 DIAGNOSIS — R109 Unspecified abdominal pain: Secondary | ICD-10-CM

## 2023-07-10 LAB — CBC
HCT: 45.2 % (ref 36.0–46.0)
Hemoglobin: 15.6 g/dL — ABNORMAL HIGH (ref 12.0–15.0)
MCH: 32.8 pg (ref 26.0–34.0)
MCHC: 34.5 g/dL (ref 30.0–36.0)
MCV: 95.2 fL (ref 80.0–100.0)
Platelets: 313 10*3/uL (ref 150–400)
RBC: 4.75 MIL/uL (ref 3.87–5.11)
RDW: 13.1 % (ref 11.5–15.5)
WBC: 6.7 10*3/uL (ref 4.0–10.5)
nRBC: 0 % (ref 0.0–0.2)

## 2023-07-10 LAB — HCG, QUANTITATIVE, PREGNANCY: hCG, Beta Chain, Quant, S: 17 m[IU]/mL — ABNORMAL HIGH (ref <5)

## 2023-07-12 ENCOUNTER — Inpatient Hospital Stay (HOSPITAL_COMMUNITY)
Admission: AD | Admit: 2023-07-12 | Discharge: 2023-07-12 | Disposition: A | Discharge status: Home / Self Care | Payer: Medicaid Other | Source: Home / Self Care | Attending: Obstetrics and Gynecology | Admitting: Obstetrics and Gynecology

## 2023-07-12 DIAGNOSIS — O039 Complete or unspecified spontaneous abortion without complication: Secondary | ICD-10-CM | POA: Diagnosis not present

## 2023-07-12 DIAGNOSIS — Z3A01 Less than 8 weeks gestation of pregnancy: Secondary | ICD-10-CM

## 2023-07-12 DIAGNOSIS — Z3200 Encounter for pregnancy test, result unknown: Secondary | ICD-10-CM | POA: Diagnosis present

## 2023-07-12 LAB — HCG, QUANTITATIVE, PREGNANCY: hCG, Beta Chain, Quant, S: 6 m[IU]/mL — ABNORMAL HIGH (ref <5)

## 2023-07-12 NOTE — MAU Note (Signed)
.  Kim Johns is a 32 y.o. at [redacted]w[redacted]d here in MAU reporting: here for repeat hcg. States she is still having vaginal bleeding, but its like a heavy period. Denies pain.   Pain score: 0 Vitals:   07/12/23 1239  BP: 126/73  Pulse: 68  Resp: 20  Temp: 98.4 F (36.9 C)  SpO2: 100%      Lab orders placed from triage:  hcg

## 2023-07-12 NOTE — MAU Provider Note (Signed)
Labs Only:  Pt here for repeat HCG. Presented to MAU initially 8/15 for vaginal bleeding with positive UPT at home. Found to have negative UPT in MAU but HCG 17, possibly suggesting resolving miscarriage vs early pregnancy. Pt denies any concerns otherwise today. Preferred to go home and wait for a call for lab results.  HCG 8/15: 17 8/17: 6  A&P: - Decline in HCG suggestive of completed miscarriage - Called patient 1415 to discuss results. She is considering tubal ligation for contraception going forward - Established in [employer], so I sent a scheduling message to get in her with an MD to discuss above

## 2023-08-11 ENCOUNTER — Ambulatory Visit: Payer: Medicaid Other | Admitting: Obstetrics and Gynecology

## 2023-12-10 ENCOUNTER — Other Ambulatory Visit: Payer: Self-pay

## 2023-12-10 ENCOUNTER — Other Ambulatory Visit (HOSPITAL_COMMUNITY): Payer: Self-pay

## 2023-12-10 MED ORDER — WEGOVY 0.25 MG/0.5ML ~~LOC~~ SOAJ
0.2500 mg | SUBCUTANEOUS | 0 refills | Status: DC
  Filled 2023-12-10 – 2024-01-26 (×2): qty 2, 28d supply, fill #0

## 2023-12-10 MED ORDER — WEGOVY 0.25 MG/0.5ML ~~LOC~~ SOAJ
0.2500 mg | SUBCUTANEOUS | 0 refills | Status: DC
Start: 2023-12-10 — End: 2024-03-23
  Filled 2023-12-10 – 2024-01-01 (×8): qty 2, 28d supply, fill #0

## 2023-12-11 ENCOUNTER — Other Ambulatory Visit (HOSPITAL_COMMUNITY): Payer: Self-pay

## 2023-12-12 ENCOUNTER — Other Ambulatory Visit: Payer: Self-pay

## 2023-12-12 ENCOUNTER — Other Ambulatory Visit (HOSPITAL_COMMUNITY): Payer: Self-pay

## 2023-12-16 ENCOUNTER — Other Ambulatory Visit (HOSPITAL_COMMUNITY): Payer: Self-pay

## 2023-12-17 ENCOUNTER — Other Ambulatory Visit (HOSPITAL_COMMUNITY): Payer: Self-pay

## 2023-12-22 ENCOUNTER — Other Ambulatory Visit (HOSPITAL_COMMUNITY): Payer: Self-pay

## 2023-12-29 ENCOUNTER — Other Ambulatory Visit (HOSPITAL_COMMUNITY): Payer: Self-pay

## 2024-01-01 ENCOUNTER — Other Ambulatory Visit (HOSPITAL_COMMUNITY): Payer: Self-pay

## 2024-01-02 ENCOUNTER — Other Ambulatory Visit (HOSPITAL_COMMUNITY): Payer: Self-pay

## 2024-01-23 ENCOUNTER — Other Ambulatory Visit (HOSPITAL_BASED_OUTPATIENT_CLINIC_OR_DEPARTMENT_OTHER): Payer: Self-pay

## 2024-01-23 ENCOUNTER — Other Ambulatory Visit (HOSPITAL_COMMUNITY): Payer: Self-pay

## 2024-01-23 MED ORDER — WEGOVY 0.5 MG/0.5ML ~~LOC~~ SOAJ
0.5000 mg | SUBCUTANEOUS | 0 refills | Status: DC
  Filled 2024-01-23 – 2024-01-28 (×2): qty 2, 28d supply, fill #0

## 2024-01-26 ENCOUNTER — Other Ambulatory Visit (HOSPITAL_COMMUNITY): Payer: Self-pay

## 2024-01-28 ENCOUNTER — Other Ambulatory Visit (HOSPITAL_COMMUNITY): Payer: Self-pay

## 2024-02-25 ENCOUNTER — Other Ambulatory Visit (HOSPITAL_COMMUNITY): Payer: Self-pay

## 2024-02-25 MED ORDER — WEGOVY 1 MG/0.5ML ~~LOC~~ SOAJ
1.0000 mL | SUBCUTANEOUS | 0 refills | Status: DC
  Filled 2024-02-25: qty 2, 28d supply, fill #0

## 2024-03-23 ENCOUNTER — Inpatient Hospital Stay (HOSPITAL_COMMUNITY)
Admission: AD | Admit: 2024-03-23 | Discharge: 2024-03-23 | Disposition: A | Discharge status: Home / Self Care | Attending: Obstetrics and Gynecology | Admitting: Obstetrics and Gynecology

## 2024-03-23 ENCOUNTER — Inpatient Hospital Stay (HOSPITAL_COMMUNITY)

## 2024-03-23 ENCOUNTER — Encounter (HOSPITAL_COMMUNITY): Payer: Self-pay | Admitting: Obstetrics and Gynecology

## 2024-03-23 DIAGNOSIS — R1030 Lower abdominal pain, unspecified: Secondary | ICD-10-CM | POA: Insufficient documentation

## 2024-03-23 DIAGNOSIS — O0931 Supervision of pregnancy with insufficient antenatal care, first trimester: Secondary | ICD-10-CM | POA: Insufficient documentation

## 2024-03-23 DIAGNOSIS — M545 Low back pain, unspecified: Secondary | ICD-10-CM | POA: Insufficient documentation

## 2024-03-23 DIAGNOSIS — O09211 Supervision of pregnancy with history of pre-term labor, first trimester: Secondary | ICD-10-CM | POA: Diagnosis not present

## 2024-03-23 DIAGNOSIS — Z3A01 Less than 8 weeks gestation of pregnancy: Secondary | ICD-10-CM

## 2024-03-23 DIAGNOSIS — O209 Hemorrhage in early pregnancy, unspecified: Secondary | ICD-10-CM | POA: Diagnosis not present

## 2024-03-23 DIAGNOSIS — O26899 Other specified pregnancy related conditions, unspecified trimester: Secondary | ICD-10-CM

## 2024-03-23 DIAGNOSIS — O26891 Other specified pregnancy related conditions, first trimester: Secondary | ICD-10-CM | POA: Insufficient documentation

## 2024-03-23 HISTORY — DX: Urinary tract infection, site not specified: N39.0

## 2024-03-23 LAB — URINALYSIS, ROUTINE W REFLEX MICROSCOPIC
Bilirubin Urine: NEGATIVE
Glucose, UA: NEGATIVE mg/dL
Ketones, ur: 20 mg/dL — AB
Leukocytes,Ua: NEGATIVE
Nitrite: NEGATIVE
Protein, ur: 30 mg/dL — AB
RBC / HPF: >50 RBC/hpf (ref 0–5)
Specific Gravity, Urine: 1.03 (ref 1.005–1.030)
pH: 5 (ref 5.0–8.0)

## 2024-03-23 LAB — WET PREP, GENITAL
Clue Cells Wet Prep HPF POC: NONE SEEN
Sperm: NONE SEEN
Trich, Wet Prep: NONE SEEN
WBC, Wet Prep HPF POC: >=10 — AB (ref <10)
Yeast Wet Prep HPF POC: NONE SEEN

## 2024-03-23 LAB — CBC
HCT: 42.3 % (ref 36.0–46.0)
Hemoglobin: 15.4 g/dL — ABNORMAL HIGH (ref 12.0–15.0)
MCH: 34.7 pg — ABNORMAL HIGH (ref 26.0–34.0)
MCHC: 36.4 g/dL — ABNORMAL HIGH (ref 30.0–36.0)
MCV: 95.3 fL (ref 80.0–100.0)
Platelets: 310 10*3/uL (ref 150–400)
RBC: 4.44 MIL/uL (ref 3.87–5.11)
RDW: 13.2 % (ref 11.5–15.5)
WBC: 6.2 10*3/uL (ref 4.0–10.5)
nRBC: 0 % (ref 0.0–0.2)

## 2024-03-23 LAB — HCG, QUANTITATIVE, PREGNANCY: hCG, Beta Chain, Quant, S: 92 m[IU]/mL — ABNORMAL HIGH (ref <5)

## 2024-03-23 LAB — POCT PREGNANCY, URINE: Preg Test, Ur: POSITIVE — AB

## 2024-03-23 NOTE — MAU Provider Note (Signed)
 Chief Complaint:  Vaginal Bleeding, Abdominal Pain, Back Pain, and Possible Pregnancy   HPI   None     Kim Johns is a 33 y.o. [id number] at [redacted]w[redacted]d presenting to maternity admissions reporting vaginal bleeding. She had a positive home pregnancy test last week and started spotting 4 days ago. She spotted intermittently for 3 days, then yesterday the bleeding became heavy. She endorses passing clots starting yesterday. Endorses associated abdominal pain described as cramping in the lower abdomen and low back . Positive history for history of prior miscarriage x2, negative for tubal ligation and IUD in situ. She is not currently using any form of contraception. They have not had IUP confirmed with ultrasound. She was recently treated for vulvovaginal candidiasis and BV earlier this month. LMP 02/16/2024, patient states her menstrual cycles are generally regular and very predictable.  Pregnancy Course: Receives care at [facility]. Positive home pregnancy test last week, otherwise no prenatal care yet.  Past Medical History:  Diagnosis Date   Abscess of breast    Anxiety    Asthma    as a child - no inhaler   Bipolar 1 disorder (HCC)    Depression    GERD (gastroesophageal reflux disease)    diet control   HSV-2 (herpes simplex virus 2) infection    Pelvic inflammatory disease    Preterm labor    UTI (urinary tract infection)    OB History  Gravida Para Term Preterm AB Living  8 3 2 1 4 2   SAB IAB Ectopic Multiple Live Births  2 2 0 0 2    # Outcome Date GA Lbr Len/2nd Weight Sex Type Anes PTL Lv  8 Current           7 SAB 06/2023     SAB     6 SAB 01/2022     SAB     5 Term 11/22/18 [redacted]w[redacted]d 07:52 / 00:08 3310 g M Vag-Spont None Y LIV  4 Term 08/23/15 [redacted]w[redacted]d 23:00 / 00:27 3646 g M Vag-Spont EPI Y LIV  3 Preterm 06/17/14 [redacted]w[redacted]d 03:59 0 g F  None  FD     Complications: Premature rupture of membranes in second trimester  2 IAB            1 IAB             Obstetric Comments  Abortion with pills x2  Cerclage 2016 and 2019   Past Surgical History:  Procedure Laterality Date   CERVICAL CERCLAGE N/A 04/05/2015   Procedure: CERCLAGE CERVICAL;  Surgeon: Lenord Radon, MD;  Location: [facility];  Service: Gynecology;  Laterality: N/A;   CERVICAL CERCLAGE N/A 05/18/2018   Procedure: CERCLAGE CERVICAL;  Surgeon: Verlyn Goad, MD;  Location: [facility];  Service: Gynecology;  Laterality: N/A;   HEMORRHOID BANDING  2023   WISDOM TOOTH EXTRACTION     Family History  Problem Relation Age of Onset   Diabetes Mother    Stroke Mother    Hypertension Father    Heart attack Father    Heart disease Father    Prostate cancer Maternal Grandfather    Cancer Paternal Uncle    Social History   Tobacco Use   Smoking status: Former    Current packs/day: 0.00    Types: Cigarettes    Quit date: 03/09/2018    Years since quitting: 6.0   Smokeless tobacco: Never   Tobacco comments:    Quit 2023  Vaping Use   Vaping status: Never Used  Substance Use Topics   Alcohol use: Not Currently    Alcohol/week: 0.0 standard drinks of alcohol    Comment: occ but none with pregnancy   Drug use: No   Allergies  Allergen Reactions   Menthol -Methyl Salicylate     Other Reaction(s): Not available   Shellfish Allergy Hives   Sulfa Antibiotics Other (See Comments)    Reaction:  Unknown; childhood reaction   Icy Hot Rash   Medications Prior to Admission  Medication Sig Dispense Refill Last Dose/Taking   Cholecalciferol (VITAMIN D3) 1.25 MG (50000 UT) CAPS Take 1 capsule by mouth once a week.   03/15/2024   Semaglutide -Weight Management (WEGOVY ) 1 MG/0.5ML SOAJ Inject 2 mg into the skin once a week. 2 mL 0 03/15/2024    I have reviewed patient's Past Medical Hx, Surgical Hx, Family Hx, Social Hx, medications and allergies.   ROS  Pertinent items noted in HPI and remainder of comprehensive ROS otherwise negative.   PHYSICAL EXAM   Patient Vitals for the past 24 hrs:  BP Temp Temp src Pulse Resp SpO2 Height Weight  03/23/24 1222 117/69 -- -- 64 -- -- -- --  03/23/24 0948 135/89 98.4 F (36.9 C) Oral 77 18 100 % 5\' 3"  (1.6 m) 91.8 kg    Constitutional: Well-developed, well-nourished female in no acute distress.  HEENT: atraumatic, normocephalic. Neck has normal ROM. EOM intact. Cardiovascular: normal rate & rhythm, warm and well-perfused Respiratory: normal effort, no problems with respiration noted GI: Abd soft, non-tender, non-distended MSK: Extremities nontender, no edema, normal ROM Skin: warm and dry. Acyanotic, no jaundice or pallor. Neurologic: Alert and oriented x 4. No abnormal coordination. Psychiatric: Normal mood. Speech not slurred, not rapid/pressured. Patient is cooperative. GU: no CVA tenderness  Labs: Results for orders placed or performed during the hospital encounter of 03/23/24 (from the past 24 hours)  Urinalysis, Routine w reflex microscopic -Urine, Clean Catch     Status: Abnormal   Collection Time: 03/23/24  9:58 AM  Result Value Ref Range   Color, Urine AMBER (A) YELLOW   APPearance HAZY (A) CLEAR   Specific Gravity, Urine 1.030 1.005 - 1.030   pH 5.0 5.0 - 8.0   Glucose, UA NEGATIVE NEGATIVE mg/dL   Hgb urine dipstick LARGE (A) NEGATIVE   Bilirubin Urine NEGATIVE NEGATIVE   Ketones, ur 20 (A) NEGATIVE mg/dL   Protein, ur 30 (A) NEGATIVE mg/dL   Nitrite NEGATIVE NEGATIVE   Leukocytes,Ua NEGATIVE NEGATIVE   RBC / HPF >50 0 - 5 RBC/hpf   WBC, UA 0-5 0 - 5 WBC/hpf   Bacteria, UA RARE (A) NONE SEEN   Squamous Epithelial / HPF 0-5 0 - 5 /HPF   Mucus PRESENT    Ca Oxalate Crys, UA PRESENT   Wet prep, genital     Status: Abnormal   Collection Time: 03/23/24 10:05 AM  Result Value Ref Range   Yeast Wet Prep HPF POC NONE SEEN NONE SEEN   Trich, Wet Prep NONE SEEN NONE SEEN   Clue Cells Wet Prep HPF POC NONE SEEN NONE SEEN   WBC, Wet Prep HPF POC >=10 (A) <10   Sperm NONE SEEN    Pregnancy, urine POC     Status: Abnormal   Collection Time: 03/23/24 10:08 AM  Result Value Ref Range   Preg Test, Ur POSITIVE (A) NEGATIVE  CBC     Status: Abnormal   Collection Time: 03/23/24 10:46 AM  Result Value Ref Range   WBC 6.2 4.0 - 10.5 K/uL   RBC 4.44 3.87 - 5.11 MIL/uL   Hemoglobin 15.4 (H) 12.0 - 15.0 g/dL   HCT 16.1 09.6 - 04.5 %   MCV 95.3 80.0 - 100.0 fL   MCH 34.7 (H) 26.0 - 34.0 pg   MCHC 36.4 (H) 30.0 - 36.0 g/dL   RDW 40.9 81.1 - 91.4 %   Platelets 310 150 - 400 K/uL   nRBC 0.0 0.0 - 0.2 %  hCG, quantitative, pregnancy     Status: Abnormal   Collection Time: 03/23/24 10:46 AM  Result Value Ref Range   hCG, Beta Chain, Quant, S 92 (H) <5 mIU/mL    Imaging:  US  OB LESS THAN 14 WEEKS WITH OB TRANSVAGINAL Result Date: 03/23/2024 CLINICAL DATA:  [id number] Vaginal bleeding in pregnancy, first trimester [id number] Abdominal cramping complicating pregnancy [id number] EXAM: OBSTETRIC <14 WK US  AND TRANSVAGINAL OB US  TECHNIQUE: Both transabdominal and transvaginal ultrasound examinations were performed for complete evaluation of the gestation as well as the maternal uterus, adnexal regions, and pelvic cul-de-sac. Transvaginal technique was performed to assess early pregnancy. COMPARISON:  None Available. FINDINGS: Intrauterine gestational sac: None Maternal uterus/adnexae: Normal-sized retroverted uterus. No focal mass. No intrauterine pregnancy. Unremarkable endometrium measuring up to 3 mm.  No focal mass. Bilateral ovaries are visualized and appears within normal limits. No adnexal mass seen on the provided images. IMPRESSION: *No intrauterine gestation is seen and no adnexal mass is evident. These findings represent a pregnancy of unknown location. The sonographic differential diagnosis includes: an intrauterine gestation too early to visualize, a spontaneous abortion, or an occult ectopic pregnancy. Continued close clinical follow-up, serial serum beta-HCG levels and short  term sonographic follow up in 7-14 days, or earlier if clinically indicated, is recommended. Electronically Signed   By: Beula Brunswick M.D.   On: 03/23/2024 11:22    MDM & MAU COURSE  MDM: Moderate  MAU Course: -Given cramping and bleeding, [place] for ectopic pregnancy rule out. Blood type already on file, O+. -Signs of dehydration on UA, no UTI. -Hgb slightly elevated, recommend follow up with PCP. -hCG 92 with no IUP on US .   Differential diagnosis considered for 1st trimester vaginal bleeding includes but is not limited to: ectopic pregnancy, complete spontaneous abortion, incomplete abortion, missed abortion, threatened abortion, false positive home pregnancy test with bleeding due to menstrual cycle, cervical or vaginal disorder   Orders Placed This Encounter  Procedures   Wet prep, genital   US  OB LESS THAN 14 WEEKS WITH OB TRANSVAGINAL   Urinalysis, Routine w reflex microscopic -Urine, Clean Catch   CBC   hCG, quantitative, pregnancy   Pregnancy, urine POC   Discharge patient   No orders of the defined types were placed in this encounter.   ASSESSMENT   1. Vaginal bleeding in pregnancy, first trimester   2. Abdominal cramping affecting pregnancy, antepartum   3. [redacted] weeks gestation of pregnancy     PLAN  Discharge home in stable condition with ectopic return precautions.  Recheck beta-hCG in 48 hours. Follow up with OBGYN regarding contraception and family planning.    Follow-up Information     [facility] Follow up in 2 day(s).   Specialty: Obstetrics and Gynecology Why: for repeat bloodwork Contact information: [address] [place]  [phone]                 Allergies  as of 03/23/2024       Reactions   Menthol -methyl Salicylate    Other Reaction(s): Not available   Shellfish Allergy Hives   Sulfa Antibiotics Other (See Comments)   Reaction:  Unknown; childhood  reaction   Icy Hot Rash        Medication List     TAKE these medications    Vitamin D3 1.25 MG (50000 UT) Caps Take 1 capsule by mouth once a week.   Wegovy  1 MG/0.5ML Soaj Generic drug: Semaglutide -Weight Management Inject 2 mg into the skin once a week.        Noreene Bearded, PA

## 2024-03-23 NOTE — MAU Note (Signed)
 Kim Johns is a 33 y.o. at Unknown here in MAU reporting: +HPT last wk.  Started spotting on Friday. Yesterday  it became heavy and she started passing clots.  Thought she might be having a miscarriage. Is having some cramping in lower abd and back.  LMP: 3/24 Onset of complaint: last Friday Pain score: moderate Vitals:   03/23/24 0948  BP: 135/89  Pulse: 77  Resp: 18  Temp: 98.4 F (36.9 C)  SpO2: 100%      Lab orders placed from triage:  UA, UPT, vag swabs

## 2024-03-23 NOTE — Discharge Instructions (Signed)
 We will recheck your blood work in 48 hours to give us  a better idea of if it is increasing or decreasing.

## 2024-03-24 LAB — GC/CHLAMYDIA PROBE AMP (~~LOC~~) NOT AT ARMC
Chlamydia: NEGATIVE
Comment: NEGATIVE
Comment: NORMAL
Neisseria Gonorrhea: NEGATIVE

## 2024-03-25 ENCOUNTER — Other Ambulatory Visit: Payer: Self-pay

## 2024-03-25 ENCOUNTER — Ambulatory Visit: Payer: Self-pay

## 2024-03-25 VITALS — BP 131/92 | HR 89 | Ht 63.0 in | Wt 203.0 lb

## 2024-03-25 DIAGNOSIS — O3680X0 Pregnancy with inconclusive fetal viability, not applicable or unspecified: Secondary | ICD-10-CM

## 2024-03-25 DIAGNOSIS — Z3A01 Less than 8 weeks gestation of pregnancy: Secondary | ICD-10-CM

## 2024-03-25 DIAGNOSIS — O039 Complete or unspecified spontaneous abortion without complication: Secondary | ICD-10-CM

## 2024-03-25 LAB — BETA HCG QUANT (REF LAB): hCG Quant: 75 m[IU]/mL

## 2024-03-25 NOTE — Progress Notes (Signed)
 Patient results addressed in nurse visit.   Reagann Dolce,RN

## 2024-03-25 NOTE — Progress Notes (Unsigned)
 Pt here today for STAT beta s/p pregnancy of unknown location.  Pt reports VB that is like a period and mild cramping.  Pt advised that she will receive a call by end of day with results and f/u.  Pt verbalized understanding.   STAT beta results 75 declined from 92 on 03/23/24.  Results reviewed by Dr. Ilona Malta  he recommends that pt follow up with weekly non stat beta until it reaches zero as her values show that she has had a miscarriage.  Pt informed providers recommendation and agrees to lab only appt on 04/01/24.   Pt advised when to go to MAU for evaluation.  Pt verbalized understanding with no further questions.   Nazli Penn,RN  03/27/24

## 2024-04-01 ENCOUNTER — Other Ambulatory Visit: Payer: Self-pay

## 2024-04-01 ENCOUNTER — Other Ambulatory Visit

## 2024-04-01 DIAGNOSIS — O039 Complete or unspecified spontaneous abortion without complication: Secondary | ICD-10-CM

## 2024-04-02 ENCOUNTER — Encounter: Payer: Self-pay | Admitting: Family Medicine

## 2024-04-02 LAB — BETA HCG QUANT (REF LAB): hCG Quant: 32 m[IU]/mL

## 2024-04-05 ENCOUNTER — Telehealth: Payer: Self-pay

## 2024-04-05 ENCOUNTER — Other Ambulatory Visit: Payer: Self-pay

## 2024-04-05 DIAGNOSIS — O039 Complete or unspecified spontaneous abortion without complication: Secondary | ICD-10-CM

## 2024-04-05 NOTE — Telephone Encounter (Addendum)
 Called patient regarding recent lab visit in our office and physician's recommendation to come back in one week for repeat HCG lab draw. Patient scheduled for Thursday, 04/08/24 at 9:30 AM. Patient confirmed scheduled appointment and denies any other needs at this time.   Moira Andrews, RN ----- Message from Teena Feast sent at 04/02/2024 12:58 PM EDT ----- Downtrending appropriately Clinical staff please coordinate follow up hcg in one week, trend to 0

## 2024-04-08 ENCOUNTER — Other Ambulatory Visit: Payer: Self-pay

## 2024-04-08 ENCOUNTER — Other Ambulatory Visit

## 2024-04-08 DIAGNOSIS — O039 Complete or unspecified spontaneous abortion without complication: Secondary | ICD-10-CM

## 2024-04-09 LAB — BETA HCG QUANT (REF LAB): hCG Quant: 43 m[IU]/mL

## 2024-04-12 ENCOUNTER — Encounter (HOSPITAL_COMMUNITY): Payer: Self-pay | Admitting: Obstetrics and Gynecology

## 2024-04-12 ENCOUNTER — Inpatient Hospital Stay (HOSPITAL_COMMUNITY)
Admission: AD | Admit: 2024-04-12 | Discharge: 2024-04-12 | Disposition: A | Discharge status: Home / Self Care | Attending: Obstetrics and Gynecology | Admitting: Obstetrics and Gynecology

## 2024-04-12 ENCOUNTER — Ambulatory Visit

## 2024-04-12 ENCOUNTER — Inpatient Hospital Stay (HOSPITAL_COMMUNITY)

## 2024-04-12 ENCOUNTER — Ambulatory Visit: Payer: Self-pay | Admitting: Family Medicine

## 2024-04-12 ENCOUNTER — Other Ambulatory Visit: Payer: Self-pay

## 2024-04-12 VITALS — BP 130/84 | HR 79 | Ht 63.0 in | Wt 202.4 lb

## 2024-04-12 DIAGNOSIS — O3680X0 Pregnancy with inconclusive fetal viability, not applicable or unspecified: Secondary | ICD-10-CM | POA: Diagnosis not present

## 2024-04-12 DIAGNOSIS — O0281 Inappropriate change in quantitative human chorionic gonadotropin (hCG) in early pregnancy: Secondary | ICD-10-CM | POA: Diagnosis present

## 2024-04-12 DIAGNOSIS — Z3A08 8 weeks gestation of pregnancy: Secondary | ICD-10-CM

## 2024-04-12 DIAGNOSIS — O009 Unspecified ectopic pregnancy without intrauterine pregnancy: Secondary | ICD-10-CM

## 2024-04-12 DIAGNOSIS — O209 Hemorrhage in early pregnancy, unspecified: Secondary | ICD-10-CM | POA: Diagnosis present

## 2024-04-12 LAB — COMPREHENSIVE METABOLIC PANEL WITH GFR
ALT: 13 U/L (ref 0–44)
AST: 17 U/L (ref 15–41)
Albumin: 3.9 g/dL (ref 3.5–5.0)
Alkaline Phosphatase: 34 U/L — ABNORMAL LOW (ref 38–126)
Anion gap: 10 (ref 5–15)
BUN: 6 mg/dL (ref 6–20)
CO2: 22 mmol/L (ref 22–32)
Calcium: 9.3 mg/dL (ref 8.9–10.3)
Chloride: 107 mmol/L (ref 98–111)
Creatinine, Ser: 0.8 mg/dL (ref 0.44–1.00)
GFR, Estimated: >60 mL/min (ref >60)
Glucose, Bld: 92 mg/dL (ref 70–99)
Potassium: 4 mmol/L (ref 3.5–5.1)
Sodium: 139 mmol/L (ref 135–145)
Total Bilirubin: 0.6 mg/dL (ref 0.0–1.2)
Total Protein: 7.1 g/dL (ref 6.5–8.1)

## 2024-04-12 LAB — CBC
HCT: 39 % (ref 36.0–46.0)
Hemoglobin: 14 g/dL (ref 12.0–15.0)
MCH: 34.2 pg — ABNORMAL HIGH (ref 26.0–34.0)
MCHC: 35.9 g/dL (ref 30.0–36.0)
MCV: 95.4 fL (ref 80.0–100.0)
Platelets: 326 10*3/uL (ref 150–400)
RBC: 4.09 MIL/uL (ref 3.87–5.11)
RDW: 13.4 % (ref 11.5–15.5)
WBC: 7.9 10*3/uL (ref 4.0–10.5)
nRBC: 0 % (ref 0.0–0.2)

## 2024-04-12 LAB — HCG, QUANTITATIVE, PREGNANCY: hCG, Beta Chain, Quant, S: 73 m[IU]/mL — ABNORMAL HIGH (ref <5)

## 2024-04-12 LAB — BETA HCG QUANT (REF LAB): hCG Quant: 52 m[IU]/mL

## 2024-04-12 MED ORDER — METHOTREXATE FOR ECTOPIC PREGNANCY
50.0000 mg/m2 | Freq: Once | INTRAMUSCULAR | Status: AC
  Administered 2024-04-12: 100 mg via INTRAMUSCULAR
  Filled 2024-04-12: qty 4

## 2024-04-12 NOTE — MAU Note (Signed)
 Kim Johns is a 33 y.o. at [redacted]w[redacted]d here in MAU reporting: sent in for suspected ectopic.  Was here end of April, was bleeding at that time.  Then it stopped.  Then restarted  bleeding, heavier than ever in her life. Stopped around 5/7.  Denies any bleeding since and no pain. Onset of complaint: ongoing labs Pain score: none Vitals:   04/12/24 1855  BP: 139/87  Pulse: 71  Resp: 17  Temp: 98.7 F (37.1 C)  SpO2: 98%      Lab orders placed from triage:

## 2024-04-12 NOTE — Progress Notes (Signed)
 Per Dr. Ilona Malta request, pt needed to come in for another STAT beta due to inappropriate rise in beta level and pt is at risk for ectopic.  Pt denies VB and pain. Pt advised that we will call with abnormal results.  Pt verbalized understanding.   Results received from [facility] of 73.  Results reviewed with Cresenzo, MD and provider recommendation to have pt go to MAU for methotrexate  tx due inappropriate rise in beta level.   Pt informed of providers recommendation and stated that she will go to MAU at her earliest convenience.  Report given to [facility], RN of pt arrival.    Nikoli Nasser,RN  04/12/24

## 2024-04-12 NOTE — MAU Provider Note (Signed)
 History     [id number]  Arrival date and time: 04/12/24 1833    Chief Complaint  Patient presents with   poss ectopic, for MTX     HPI Kim Johns is a 33 y.o. at [redacted]w[redacted]d who presents for abnormal hcg trend.   First seen in MAU on 03/23/2024 with vaginal bleeding, at that time no IUP seen, hcg was 92 03/25/24 - hcg 75 04/01/24 - hcg 32 04/08/24 - hcg 43 04/12/24 (today in clinic) - hcg 52  Seen today in clinic for abnormal hcg trend as above, once resulted was sent to MAU for evaluation  Today reports no abdominal pain or vaginal bleeding Would like to get an US  to make sure of what is going on      OB History     Gravida  8   Para  3   Term  2   Preterm  1   AB  4   Living  2      SAB  2   IAB  2   Ectopic  0   Multiple  0   Live Births  2        Obstetric Comments  Abortion with pills x2 Cerclage 2016 and 2019         Past Medical History:  Diagnosis Date   Abscess of breast    Anxiety    Asthma    as a child - no inhaler   Bipolar 1 disorder (HCC)    Depression    GERD (gastroesophageal reflux disease)    diet control   HSV-2 (herpes simplex virus 2) infection    Pelvic inflammatory disease    Preterm labor    UTI (urinary tract infection)     Past Surgical History:  Procedure Laterality Date   CERVICAL CERCLAGE N/A 04/05/2015   Procedure: CERCLAGE CERVICAL;  Surgeon: Lenord Radon, MD;  Location: [facility];  Service: Gynecology;  Laterality: N/A;   CERVICAL CERCLAGE N/A 05/18/2018   Procedure: CERCLAGE CERVICAL;  Surgeon: Verlyn Goad, MD;  Location: [facility];  Service: Gynecology;  Laterality: N/A;   HEMORRHOID BANDING  2023   WISDOM TOOTH EXTRACTION      Family History  Problem Relation Age of Onset   Diabetes Mother    Stroke Mother    Hypertension Father    Heart attack Father    Heart disease Father    Prostate cancer Maternal Grandfather    Cancer Paternal Uncle     Social History   Socioeconomic  History   Marital status: Single    Spouse name: Not on file   Number of children: 2   Years of education: Not on file   Highest education level: Not on file  Occupational History   Not on file  Tobacco Use   Smoking status: Former    Current packs/day: 0.00    Types: Cigarettes    Quit date: 03/09/2018    Years since quitting: 6.0   Smokeless tobacco: Never   Tobacco comments:    Quit 2023  Vaping Use   Vaping status: Never Used  Substance and Sexual Activity   Alcohol use: Not Currently    Alcohol/week: 0.0 standard drinks of alcohol    Comment: occ but none with pregnancy   Drug use: No   Sexual activity: Yes    Birth control/protection: None  Other Topics Concern   Not on file  Social History Narrative   Not on file   Social Drivers  of Health   Financial Resource Strain: Low Risk  (10/04/2019)   Overall Financial Resource Strain (CARDIA)    Difficulty of Paying Living Expenses: Not hard at all  Food Insecurity: Low Risk  (03/02/2024)   Received from [facility]   Hunger Vital Sign    Worried About Running Out of Food in the Last Year: Never true    Ran Out of Food in the Last Year: Never true  Transportation Needs: No Transportation Needs (03/02/2024)   Received from [facility]    In the past 12 months, has lack of reliable transportation kept you from medical appointments, meetings, work or from getting things needed for daily living? : No  Physical Activity: Not on file  Stress: Not on file  Social Connections: Unknown (10/04/2019)   Social Connection and Isolation Panel [NHANES]    Frequency of Communication with Friends and Family: Not on file    Frequency of Social Gatherings with Friends and Family: Not on file    Attends Religious Services: Not on file    Active Member of Clubs or Organizations: No    Attends Banker Meetings: Never    Marital Status: Never married  Intimate Partner Violence: Not At Risk (10/04/2019)    Humiliation, Afraid, Rape, and Kick questionnaire    Fear of Current or Ex-Partner: No    Emotionally Abused: No    Physically Abused: No    Sexually Abused: No    Allergies  Allergen Reactions   Menthol -Methyl Salicylate     Other Reaction(s): Not available   Shellfish Allergy Hives   Sulfa Antibiotics Other (See Comments)    Reaction:  Unknown; childhood reaction   Icy Hot Rash    No current facility-administered medications on file prior to encounter.   No current outpatient medications on file prior to encounter.     ROS Pertinent positives and negative per HPI, all others reviewed and negative  Physical Exam   BP 139/87 (BP Location: Right Arm)   Pulse 71   Temp 98.7 F (37.1 C) (Oral)   Resp 17   Ht 5\' 3"  (1.6 m)   Wt 92.1 kg   LMP 02/16/2024   SpO2 98%   BMI 35.96 kg/m   Patient Vitals for the past 24 hrs:  BP Temp Temp src Pulse Resp SpO2 Height Weight  04/12/24 1855 139/87 98.7 F (37.1 C) Oral 71 17 98 % 5\' 3"  (1.6 m) 92.1 kg    Physical Exam Vitals reviewed.  Constitutional:      General: She is not in acute distress.    Appearance: She is well-developed. She is not diaphoretic.  Eyes:     General: No scleral icterus. Pulmonary:     Effort: Pulmonary effort is normal. No respiratory distress.  Skin:    General: Skin is warm and dry.  Neurological:     Mental Status: She is alert.     Coordination: Coordination normal.      Cervical Exam    Bedside Ultrasound Not performed.  My interpretation: n/a   Labs Results for orders placed or performed during the hospital encounter of 04/12/24 (from the past 24 hours)  CBC     Status: Abnormal   Collection Time: 04/12/24  7:09 PM  Result Value Ref Range   WBC 7.9 4.0 - 10.5 K/uL   RBC 4.09 3.87 - 5.11 MIL/uL   Hemoglobin 14.0 12.0 - 15.0 g/dL   HCT 13.2 44.0 - 10.2 %  MCV 95.4 80.0 - 100.0 fL   MCH 34.2 (H) 26.0 - 34.0 pg   MCHC 35.9 30.0 - 36.0 g/dL   RDW 78.4 69.6 - 29.5 %    Platelets 326 150 - 400 K/uL   nRBC 0.0 0.0 - 0.2 %  Comprehensive metabolic panel with GFR     Status: Abnormal   Collection Time: 04/12/24  7:09 PM  Result Value Ref Range   Sodium 139 135 - 145 mmol/L   Potassium 4.0 3.5 - 5.1 mmol/L   Chloride 107 98 - 111 mmol/L   CO2 22 22 - 32 mmol/L   Glucose, Bld 92 70 - 99 mg/dL   BUN 6 6 - 20 mg/dL   Creatinine, Ser 2.84 0.44 - 1.00 mg/dL   Calcium  9.3 8.9 - 10.3 mg/dL   Total Protein 7.1 6.5 - 8.1 g/dL   Albumin 3.9 3.5 - 5.0 g/dL   AST 17 15 - 41 U/L   ALT 13 0 - 44 U/L   Alkaline Phosphatase 34 (L) 38 - 126 U/L   Total Bilirubin 0.6 0.0 - 1.2 mg/dL   GFR, Estimated >13 >24 mL/min   Anion gap 10 5 - 15  hCG, quantitative, pregnancy     Status: Abnormal   Collection Time: 04/12/24  7:09 PM  Result Value Ref Range   hCG, Beta Chain, Quant, S 73 (H) <5 mIU/mL    Imaging US  OB Transvaginal Result Date: 04/12/2024 CLINICAL DATA:  Pregnancy of unknown location EXAM: OBSTETRIC <14 WK US  AND TRANSVAGINAL OB US  TECHNIQUE: Both transabdominal and transvaginal ultrasound examinations were performed for complete evaluation of the gestation as well as the maternal uterus, adnexal regions, and pelvic cul-de-sac. Transvaginal technique was performed to assess early pregnancy. COMPARISON:  Ultrasound 03/23/2024 FINDINGS: Intrauterine gestational sac: None Yolk sac:  Not Visualized. Embryo:  Not Visualized. Maternal uterus/adnexae: Left ovary measures 3.2 x 2.3 x 2.5 cm. Right ovary measures 4.8 x 2.2 x 2.5 cm. Trace free fluid. IMPRESSION: No IUP identified. Findings consistent with pregnancy of unknown location, differential of which includes IUP too early to visualize, recent failed pregnancy, and occult ectopic pregnancy. Trending of HCG with repeat ultrasound is recommended. Trace free fluid Electronically Signed   By: Esmeralda Hedge M.D.   On: 04/12/2024 21:31    MAU Course  Procedures Lab Orders         CBC         Comprehensive metabolic panel  with GFR         hCG, quantitative, pregnancy    Meds ordered this encounter  Medications   methotrexate  (for ectopic pregnancy) 25 mg/mL chemo injection   Imaging Orders         US  OB Transvaginal     MDM Moderate (Level 3-4)  Assessment and Plan  #Ectopic pregnancy #[redacted] weeks gestation of pregnancy Reviewed abnormal trend concerning for ectopic pregnancy vs abnormal/nonviable IUP vs highly unlikely scenario of repeat pregnancy (though regardless trend of hcg is abnormal even if that were the case).  The risks of methotrexate  were reviewed including failure requiring repeat dosing or eventual surgery. She understands that methotrexate  involves frequent return visits to monitor lab values and that she remains at risk of ectopic rupture until her beta is less than assay. ?The patient opts to proceed with methotrexate .  She has no history of hepatic or renal dysfunction, has normal BUN/Cr/LFT's/platelets.  She is felt to be reliable for follow-up. Side effects of photosensitivity & GI upset were  discussed.  She knows to avoid direct sunlight and abstain from alcohol, NSAIDs and sexual intercourse for two weeks. She was counseled to discontinue any MVI with folic acid. ?She understands to follow up on D4 (Thursday 04/15/2024 at [facility], message sent to coordinate) and D7 (Sunday 04/18/2024 in MAU) for repeat BHCG and was given the instruction sheet. ?Strict ectopic precautions were reviewed, the patient knows to call with any abdominal pain, vomiting, fainting, or any concerns with her health.  Day 0/1 Day 4 Day 7  Sunday Wednesday Saturday  Monday Thursday Sunday  Tuesday Friday Monday  Wednesday Saturday Tuesday  Thursday Sunday Wednesday  Friday Monday Thursday  Saturday Tuesday Friday    Dispo: discharged to home in stable condition     Teena Feast, MD/MPH 04/12/24 9:48 PM  Allergies as of 04/12/2024       Reactions   Menthol -methyl Salicylate    Other Reaction(s): Not  available   Shellfish Allergy Hives   Sulfa Antibiotics Other (See Comments)   Reaction:  Unknown; childhood reaction   Icy Hot Rash        Medication List    You have not been prescribed any medications.

## 2024-04-12 NOTE — Telephone Encounter (Addendum)
-----   Message from Teena Feast sent at 04/12/2024 10:59 AM EDT ----- Abnormal hcg trend. Would have patient come for stat beta today or tomorrow, if not significantly downtrended this would raise concern for ectopic and she should be evaluated in MAU for treatment with methotrexate   Notified pt results and providers recommendation.  Pt agreed that she will be able to come in by noon today for STAT beta.   Kristoff Coonradt,RN  04/12/24

## 2024-04-12 NOTE — Discharge Instructions (Signed)
 The risks of methotrexate  were reviewed including failure requiring repeat dosing or eventual surgery. You expressed understanding that methotrexate  involves frequent return visits to monitor lab values and that you remain at risk of ectopic rupture until your pregnancy hormone level returns to normal. You have opted to proceed with methotrexate .  We discussed side effects of photosensitivity & GI upset.  You should avoid direct sunlight and abstain from alcohol, NSAIDs and sexual intercourse for two weeks. If you are taking any multivitamins with folic acid you should discontinue them.  ?You should follow up on day 4 (Thursday May 22 at [facility]) and day 7 (Sunday May 25 at the MAU) for repeat pregnancy hormone level checks.  ?Call or return to the MAU with any abdominal pain, vomiting, fainting, or fever.

## 2024-04-12 NOTE — Progress Notes (Signed)
 Abnormal hcg trend. Would have patient come for stat beta today or tomorrow, if not significantly downtrended this would raise concern for ectopic and she should be evaluated in MAU for treatment with methotrexate 

## 2024-04-15 ENCOUNTER — Other Ambulatory Visit

## 2024-04-15 ENCOUNTER — Other Ambulatory Visit: Payer: Self-pay

## 2024-04-15 DIAGNOSIS — O009 Unspecified ectopic pregnancy without intrauterine pregnancy: Secondary | ICD-10-CM

## 2024-04-16 ENCOUNTER — Ambulatory Visit: Payer: Self-pay | Admitting: Family Medicine

## 2024-04-16 LAB — BETA HCG QUANT (REF LAB): hCG Quant: 52 m[IU]/mL

## 2024-04-16 NOTE — Telephone Encounter (Addendum)
-----   Message from Teena Feast sent at 04/16/2024  8:13 AM EDT ----- Day 4 s/p methotrexate , patient to follow up for day 7 hcg in MAU   Left voicemail advising pt to go to MAU at [facility] on Sunday morning 04/18/24 for blood draw. Sent MyChart message as well.  Report given to Jolynn MAU charge nurse.  Carolynne Citron, RN

## 2024-04-20 ENCOUNTER — Other Ambulatory Visit

## 2024-04-20 ENCOUNTER — Other Ambulatory Visit: Payer: Self-pay

## 2024-04-20 DIAGNOSIS — O009 Unspecified ectopic pregnancy without intrauterine pregnancy: Secondary | ICD-10-CM

## 2024-04-21 LAB — BETA HCG QUANT (REF LAB): hCG Quant: 22 m[IU]/mL

## 2024-04-22 ENCOUNTER — Ambulatory Visit: Payer: Self-pay | Admitting: Family Medicine

## 2024-04-22 DIAGNOSIS — O009 Unspecified ectopic pregnancy without intrauterine pregnancy: Secondary | ICD-10-CM

## 2024-04-22 DIAGNOSIS — O0281 Inappropriate change in quantitative human chorionic gonadotropin (hCG) in early pregnancy: Secondary | ICD-10-CM

## 2024-04-22 NOTE — Progress Notes (Signed)
 Downtrending appropriately s/p methotrexate  Clinical staff please coordinate follow up hcg in one week, trend to 0

## 2024-04-22 NOTE — Telephone Encounter (Signed)
-----   Message from Teena Feast sent at 04/22/2024  8:52 AM EDT ----- Downtrending appropriately s/p methotrexate  Clinical staff please coordinate follow up hcg in one week, trend to 0

## 2024-04-22 NOTE — Telephone Encounter (Signed)
 Called and spoke with patient. Gave  her results of HCG. She was informed she needs Hcg's until the level is at 0. Scheduled follow up Lab appt at patient's convenience.   Patient reports no pain. She reports bleeding started this morning and that she has no concerns at this time.

## 2024-04-27 ENCOUNTER — Other Ambulatory Visit

## 2024-04-27 DIAGNOSIS — O0281 Inappropriate change in quantitative human chorionic gonadotropin (hCG) in early pregnancy: Secondary | ICD-10-CM

## 2024-04-27 DIAGNOSIS — O009 Unspecified ectopic pregnancy without intrauterine pregnancy: Secondary | ICD-10-CM

## 2024-04-28 LAB — BETA HCG QUANT (REF LAB): hCG Quant: 5 m[IU]/mL

## 2024-04-30 ENCOUNTER — Ambulatory Visit: Payer: Self-pay | Admitting: Family Medicine

## 2024-04-30 DIAGNOSIS — O039 Complete or unspecified spontaneous abortion without complication: Secondary | ICD-10-CM

## 2024-05-04 NOTE — Telephone Encounter (Addendum)
-----   Message from Teena Feast sent at 04/30/2024 11:24 AM EDT ----- Downtrending appropriately Clinical staff please coordinate follow up hcg in one week, if still less than 5 can discontinue further checks  Pt notified of results and pt agreed to non beta appt on 05/05/24 at 0930.    Tamilyn Lupien,RN

## 2024-05-05 ENCOUNTER — Other Ambulatory Visit

## 2024-05-05 ENCOUNTER — Other Ambulatory Visit: Payer: Self-pay

## 2024-05-05 DIAGNOSIS — O039 Complete or unspecified spontaneous abortion without complication: Secondary | ICD-10-CM

## 2024-05-06 ENCOUNTER — Ambulatory Visit: Payer: Self-pay | Admitting: Family Medicine

## 2024-05-06 LAB — BETA HCG QUANT (REF LAB): hCG Quant: 1 m[IU]/mL

## 2024-05-26 ENCOUNTER — Other Ambulatory Visit (HOSPITAL_COMMUNITY): Payer: Self-pay

## 2024-05-26 MED ORDER — WEGOVY 0.25 MG/0.5ML ~~LOC~~ SOAJ
0.2500 mg | SUBCUTANEOUS | 0 refills | Status: AC
  Filled 2024-05-26: qty 2, 28d supply, fill #0

## 2024-06-17 ENCOUNTER — Other Ambulatory Visit (HOSPITAL_COMMUNITY): Payer: Self-pay

## 2024-06-17 MED ORDER — WEGOVY 0.5 MG/0.5ML ~~LOC~~ SOAJ
0.5000 mg | SUBCUTANEOUS | 0 refills | Status: AC
  Filled 2024-06-17 – 2024-06-21 (×2): qty 2, 28d supply, fill #0

## 2024-06-18 ENCOUNTER — Other Ambulatory Visit (HOSPITAL_COMMUNITY): Payer: Self-pay

## 2024-06-21 ENCOUNTER — Encounter: Payer: Self-pay | Admitting: Obstetrics and Gynecology

## 2024-06-21 ENCOUNTER — Other Ambulatory Visit (HOSPITAL_COMMUNITY): Payer: Self-pay

## 2024-07-20 ENCOUNTER — Other Ambulatory Visit (HOSPITAL_COMMUNITY): Payer: Self-pay

## 2024-07-20 MED ORDER — SENNA 8.6 MG PO TABS
ORAL_TABLET | ORAL | 1 refills | Status: AC
  Filled 2024-07-20: qty 60, 30d supply, fill #0
  Filled 2024-12-30: qty 60, 30d supply, fill #1

## 2024-07-20 MED ORDER — WEGOVY 1 MG/0.5ML ~~LOC~~ SOAJ
1.0000 mg | SUBCUTANEOUS | 0 refills | Status: AC
  Filled 2024-07-20: qty 2, 28d supply, fill #0

## 2024-07-20 MED ORDER — DOCUSATE SODIUM 100 MG PO CAPS
100.0000 mg | ORAL_CAPSULE | Freq: Every day | ORAL | 2 refills | Status: AC
  Filled 2024-07-20: qty 30, 30d supply, fill #0
  Filled 2024-12-30: qty 30, 30d supply, fill #1

## 2024-07-20 MED ORDER — DERMACINRX PUREFOLIX 1-5000 MG-UNIT PO TABS: 1.0000 | ORAL_TABLET | Freq: Every day | ORAL | 0 refills | Status: AC

## 2024-08-16 ENCOUNTER — Other Ambulatory Visit (HOSPITAL_COMMUNITY): Payer: Self-pay

## 2024-08-17 ENCOUNTER — Other Ambulatory Visit (HOSPITAL_COMMUNITY): Payer: Self-pay

## 2024-08-19 ENCOUNTER — Other Ambulatory Visit: Payer: Self-pay

## 2024-08-19 ENCOUNTER — Other Ambulatory Visit (HOSPITAL_COMMUNITY): Payer: Self-pay

## 2024-08-19 MED ORDER — WEGOVY 1.7 MG/0.75ML ~~LOC~~ SOAJ
1.7000 mg | SUBCUTANEOUS | 0 refills | Status: DC
  Filled 2024-08-19: qty 3, 28d supply, fill #0

## 2024-08-19 MED ORDER — DERMACINRX PUREFOLIX 1-5000 MG-UNIT PO TABS: 1.0000 | ORAL_TABLET | Freq: Every day | ORAL | 0 refills | Status: AC

## 2024-09-05 ENCOUNTER — Encounter (HOSPITAL_BASED_OUTPATIENT_CLINIC_OR_DEPARTMENT_OTHER): Payer: Self-pay

## 2024-09-05 ENCOUNTER — Other Ambulatory Visit: Payer: Self-pay

## 2024-09-05 ENCOUNTER — Emergency Department (HOSPITAL_BASED_OUTPATIENT_CLINIC_OR_DEPARTMENT_OTHER)
Admission: EM | Admit: 2024-09-05 | Discharge: 2024-09-05 | Disposition: A | Discharge status: Home / Self Care | Attending: Emergency Medicine | Admitting: Emergency Medicine

## 2024-09-05 DIAGNOSIS — S0001XA Abrasion of scalp, initial encounter: Secondary | ICD-10-CM | POA: Diagnosis not present

## 2024-09-05 DIAGNOSIS — Y92410 Unspecified street and highway as the place of occurrence of the external cause: Secondary | ICD-10-CM | POA: Diagnosis not present

## 2024-09-05 DIAGNOSIS — S46811A Strain of other muscles, fascia and tendons at shoulder and upper arm level, right arm, initial encounter: Secondary | ICD-10-CM | POA: Insufficient documentation

## 2024-09-05 DIAGNOSIS — G44319 Acute post-traumatic headache, not intractable: Secondary | ICD-10-CM | POA: Diagnosis not present

## 2024-09-05 MED ORDER — IBUPROFEN 800 MG PO TABS
800.0000 mg | ORAL_TABLET | Freq: Once | ORAL | Status: AC
  Administered 2024-09-05: 800 mg via ORAL
  Filled 2024-09-05: qty 1

## 2024-09-05 NOTE — ED Notes (Signed)
 Reviewed AVS/discharge instructions with patient. Time allotted for and all questions answered. Patient is agreeable for d/c and escorted to ED exit by staff.
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
## 2024-09-09 ENCOUNTER — Other Ambulatory Visit (HOSPITAL_COMMUNITY): Payer: Self-pay

## 2024-09-09 ENCOUNTER — Other Ambulatory Visit: Payer: Self-pay

## 2024-09-09 MED ORDER — ACCRUFER 30 MG PO CAPS
1.0000 | ORAL_CAPSULE | Freq: Two times a day (BID) | ORAL | 2 refills | Status: AC
  Filled 2024-09-09: qty 60, 30d supply, fill #0
  Filled 2024-12-30: qty 60, 30d supply, fill #1

## 2024-09-09 MED ORDER — NORELGESTROMIN-ETH ESTRADIOL 150-35 MCG/24HR TD PTWK
1.0000 | MEDICATED_PATCH | TRANSDERMAL | 3 refills | Status: AC
  Filled 2024-09-09: qty 9, 84d supply, fill #0
  Filled 2024-12-30: qty 9, 84d supply, fill #1

## 2024-09-09 MED ORDER — WEGOVY 1.7 MG/0.75ML ~~LOC~~ SOAJ
1.7000 mg | SUBCUTANEOUS | 0 refills | Status: AC
  Filled 2024-09-09 – 2024-09-10 (×2): qty 3, 28d supply, fill #0

## 2024-09-09 MED ORDER — D3-50 1.25 MG (50000 UT) PO CAPS
50000.0000 [IU] | ORAL_CAPSULE | ORAL | 1 refills | Status: AC
  Filled 2024-09-09 – 2024-09-28 (×2): qty 12, 84d supply, fill #0

## 2024-09-10 ENCOUNTER — Other Ambulatory Visit: Payer: Self-pay

## 2024-09-10 ENCOUNTER — Other Ambulatory Visit (HOSPITAL_COMMUNITY): Payer: Self-pay

## 2024-09-13 ENCOUNTER — Other Ambulatory Visit: Payer: Self-pay

## 2024-09-13 ENCOUNTER — Other Ambulatory Visit (HOSPITAL_COMMUNITY): Payer: Self-pay

## 2024-09-13 MED ORDER — D3-50 1.25 MG (50000 UT) PO CAPS
50000.0000 [IU] | ORAL_CAPSULE | ORAL | 1 refills | Status: AC
  Filled 2024-09-13 – 2024-12-30 (×2): qty 13, 91d supply, fill #0

## 2024-09-14 ENCOUNTER — Other Ambulatory Visit (HOSPITAL_COMMUNITY): Payer: Self-pay

## 2024-09-16 ENCOUNTER — Other Ambulatory Visit (HOSPITAL_COMMUNITY): Payer: Self-pay

## 2024-09-19 ENCOUNTER — Other Ambulatory Visit: Payer: Self-pay

## 2024-09-19 ENCOUNTER — Emergency Department (HOSPITAL_BASED_OUTPATIENT_CLINIC_OR_DEPARTMENT_OTHER)
Admission: EM | Admit: 2024-09-19 | Discharge: 2024-09-19 | Disposition: A | Discharge status: Home / Self Care | Attending: Emergency Medicine | Admitting: Emergency Medicine

## 2024-09-19 ENCOUNTER — Encounter (HOSPITAL_BASED_OUTPATIENT_CLINIC_OR_DEPARTMENT_OTHER): Payer: Self-pay

## 2024-09-19 DIAGNOSIS — W101XXA Fall (on)(from) sidewalk curb, initial encounter: Secondary | ICD-10-CM | POA: Diagnosis not present

## 2024-09-19 DIAGNOSIS — S91209A Unspecified open wound of unspecified toe(s) with damage to nail, initial encounter: Secondary | ICD-10-CM

## 2024-09-19 DIAGNOSIS — S99921A Unspecified injury of right foot, initial encounter: Secondary | ICD-10-CM | POA: Diagnosis present

## 2024-09-19 DIAGNOSIS — S91201A Unspecified open wound of right great toe with damage to nail, initial encounter: Secondary | ICD-10-CM | POA: Insufficient documentation

## 2024-09-19 MED ORDER — CEPHALEXIN 500 MG PO CAPS: 500.0000 mg | ORAL_CAPSULE | Freq: Four times a day (QID) | ORAL | 0 refills | Status: AC

## 2024-09-19 MED ORDER — HYDROCODONE-ACETAMINOPHEN 5-325 MG PO TABS: 1.0000 | ORAL_TABLET | Freq: Four times a day (QID) | ORAL | 0 refills | Status: AC | PRN

## 2024-09-19 MED ORDER — LIDOCAINE HCL 2 % IJ SOLN
20.0000 mL | Freq: Once | INTRAMUSCULAR | Status: DC
  Filled 2024-09-19: qty 20

## 2024-09-19 NOTE — ED Triage Notes (Signed)
 Pt reports hitting R big toe against curb. Pt nail is broke and coming off.

## 2024-09-19 NOTE — Discharge Instructions (Signed)
 I have sent in medication for pain and an antibiotic to help prevent infection.  Do not drive or work if taking the prescribed pain medication as it can make you drowsy.  Follow-up with podiatry and return with any concerns.

## 2024-09-19 NOTE — ED Provider Notes (Signed)
 [place] EMERGENCY DEPARTMENT AT [facility] Provider Note   CSN: [id number] Arrival date & time: 09/19/24  1053     Patient presents with: Toe Injury   Kim Johns is a 33 y.o. female.   Patient presents to the emergency department for evaluation of right great toenail avulsion.  Patient struck her toe on a curb last night.  It caused the nail to bend up.  She has pain with manipulation of the nail.  She is requesting toenail removal.  No active bleeding.  Denies other injuries.       Prior to Admission medications   Medication Sig Start Date End Date Taking? Authorizing Provider  D3-50 1.25 MG (50000 UT) capsule Take 1 capsule (50,000 Units total) by mouth once a week. 09/09/24     D3-50 1.25 MG (50000 UT) capsule Take 1 capsule (50,000 Units total) by mouth once a week. 09/12/24     docusate sodium  (COLACE) 100 MG capsule Take 1 capsule (100 mg total) by mouth daily. 07/20/24     Ferric Maltol (ACCRUFER) 30 MG CAPS Take 1 capsule (30 mg total) by mouth 2 (two) times daily. 09/09/24     Folic Acid-Cholecalciferol (DERMACINRX PUREFOLIX) 11-4998 MG-UNIT TABS 1 tab by mouth daily 07/20/24     Folic Acid-Cholecalciferol (DERMACINRX PUREFOLIX) 11-4998 MG-UNIT TABS 1 tab by mouth daily 08/19/24     norelgestromin-ethinyl estradiol  (ZAFEMY) 150-35 MCG/24HR transdermal patch 1 patch onto the skin every week; apply once weekly for 3 weeks of a 4-week cycle 09/09/24     Semaglutide -Weight Management (WEGOVY ) 0.25 MG/0.5ML SOAJ Inject 0.25 mg into the skin once a week. 05/26/24     Semaglutide -Weight Management (WEGOVY ) 0.5 MG/0.5ML SOAJ Inject 0.5 mg into the skin once a week. 06/17/24     semaglutide -weight management (WEGOVY ) 1 MG/0.5ML SOAJ SQ injection Inject 1 mg into the skin once a week. 07/20/24     semaglutide -weight management (WEGOVY ) 1.7 MG/0.75ML SOAJ SQ injection Inject 1.7 mg into the skin once a week. 09/09/24     senna (SENOKOT) 8.6 MG TABS tablet Take 1 (one) tablet  by mouth two times daily, as needed, until Bowel Movement. Then as needed for constipation 07/20/24       Allergies: Menthol -methyl salicylate, Shellfish allergy, Sulfa antibiotics, and Icy hot    Review of Systems  Updated Vital Signs BP (!) 153/102 (BP Location: Right Arm)   Pulse 89   Temp 98 F (36.7 C) (Temporal)   Resp 17   Ht 5' 3 (1.6 m)   Wt 86.2 kg   LMP 08/13/2024   SpO2 100%   BMI 33.66 kg/m   Physical Exam Vitals and nursing note reviewed.  Constitutional:      Appearance: She is well-developed.  HENT:     Head: Normocephalic and atraumatic.  Eyes:     Conjunctiva/sclera: Conjunctivae normal.  Pulmonary:     Effort: No respiratory distress.  Musculoskeletal:     Cervical back: Normal range of motion and neck supple.  Skin:    General: Skin is warm and dry.     Comments: Left great toenail, which is painted and jewel encrusted, is partially avulsed, angled upward from the base of the nail.  No bleeding.  Neurological:     Mental Status: She is alert.     (all labs ordered are listed, but only abnormal results are displayed) Labs Reviewed - No data to display  EKG: None  Radiology: No results found.   .Nerve Block  Date/Time: 09/19/2024 2:55 PM  Performed by: Desiderio Chew, PA-C Authorized by: Desiderio Chew, PA-C   Consent:    Consent obtained:  Verbal   Consent given by:  Patient   Risks discussed:  Pain, unsuccessful block and bleeding   Alternatives discussed:  Referral Universal protocol:    Patient identity confirmed:  Verbally with patient and provided demographic data Indications:    Indications:  Procedural anesthesia and pain relief Location:    Body area:  Lower extremity   Laterality:  Right Pre-procedure details:    Skin preparation:  Povidone-iodine   Preparation: Patient was prepped and draped in usual sterile fashion   Skin anesthesia:    Skin anesthesia method:  None Procedure details:    Block needle gauge:  25  G   Anesthetic injected:  Lidocaine  2% w/o epi   Injection procedure:  Anatomic landmarks identified, incremental injection, negative aspiration for blood, anatomic landmarks palpated and introduced needle Post-procedure details:    Outcome:  Pain improved   Procedure completion:  Tolerated well, no immediate complications Comments:     Patient had partial anesthesia of the great toe.  I needed to inject additional lidocaine  around the periphery of the nailbed and into the nailbed itself.  After a couple attempts at this, adequate anesthesia achieved.  I was able to remove the toenail with traction.  I cleaned the nail with saline.  I debrided tissue from around the nailbed that was nonviable.  I evaluated the nailbed and did not find any substantial nailbed laceration.    Medications Ordered in the ED  lidocaine  (XYLOCAINE ) 2 % (with pres) injection 400 mg (has no administration in time range)   ED Course  Patient seen and examined. History obtained directly from patient.   Labs/EKG: None ordered  Imaging: None ordered  Medications/Fluids: Ordered: Lidocaine  2%  Most recent vital signs reviewed and are as follows: BP (!) 153/102 (BP Location: Right Arm)   Pulse 89   Temp 98 F (36.7 C) (Temporal)   Resp 17   Ht 5' 3 (1.6 m)   Wt 86.2 kg   LMP 08/13/2024   SpO2 100%   BMI 33.66 kg/m   Initial impression: Toenail avulsion.  Discussed attempt at receding of the nail for optimal regrowth of new nail versus toenail removal which increases risk of the toenail not growing back, or growing back abnormally.  Patient is adamant about me removing the nail.  Will plan on digital block and removal when anesthetized.  11:49 AM Skin cleaned, digital block performed.   //  Patient was initially going to allow me to place the nail back into place and glue with Dermabond.  However that she then changed her mind.  I removed the nail.  Wound care prior to arrival with bandaging.  Most  current vital signs reviewed and are as follows: BP (!) 153/102 (BP Location: Right Arm)   Pulse 89   Temp 98 F (36.7 C) (Temporal)   Resp 17   Ht 5' 3 (1.6 m)   Wt 86.2 kg   LMP 08/13/2024   SpO2 100%   BMI 33.66 kg/m   Plan: Discharge to home.   Prescriptions written for: Keflex , Vicodin  Other home care instructions discussed: Patient counseled on wound care.    ED return instructions discussed: Patient was urged to return to the Emergency Department urgently with worsening pain, swelling, expanding erythema especially if it streaks away from the affected area, fever, or if they have  any other concerns.   Follow-up instructions discussed: Follow-up as needed.                                  Medical Decision Making Risk Prescription drug management.   Toenail avulsion, no significant nailbed laceration.  Toenail was completely removed.  Patient did not want it to remain in place.  We discussed of the risk of interference with growth of new nail if removed.  Discussed wound care.  She will be given 5 days of Keflex  given location and open wound, she will be given small amount of pain medication.  Otherwise OTC meds.     Final diagnoses:  Avulsion of toenail, initial encounter    ED Discharge Orders          Ordered    cephALEXin  (KEFLEX ) 500 MG capsule  4 times daily        09/19/24 1255    HYDROcodone -acetaminophen  (NORCO/VICODIN) 5-325 MG tablet  Every 6 hours PRN        09/19/24 1255               Travontae Freiberger, PA-C 09/19/24 1500    Geroldine Berg, MD 09/21/24 [phone]

## 2024-09-27 ENCOUNTER — Other Ambulatory Visit (HOSPITAL_COMMUNITY): Payer: Self-pay

## 2024-09-28 ENCOUNTER — Other Ambulatory Visit (HOSPITAL_COMMUNITY): Payer: Self-pay

## 2024-11-23 ENCOUNTER — Other Ambulatory Visit (HOSPITAL_COMMUNITY): Payer: Self-pay

## 2024-11-23 MED ORDER — WEGOVY 0.25 MG/0.5ML ~~LOC~~ SOAJ
0.2500 mg | SUBCUTANEOUS | 0 refills | Status: AC
  Filled 2024-11-23: qty 2, 28d supply, fill #0

## 2024-11-24 ENCOUNTER — Other Ambulatory Visit (HOSPITAL_COMMUNITY): Payer: Self-pay

## 2024-11-30 ENCOUNTER — Other Ambulatory Visit (HOSPITAL_COMMUNITY): Payer: Self-pay

## 2024-12-01 ENCOUNTER — Other Ambulatory Visit (HOSPITAL_COMMUNITY): Payer: Self-pay

## 2024-12-02 ENCOUNTER — Other Ambulatory Visit (HOSPITAL_COMMUNITY): Payer: Self-pay

## 2024-12-29 ENCOUNTER — Other Ambulatory Visit (HOSPITAL_COMMUNITY): Payer: Self-pay

## 2024-12-29 MED ORDER — WEGOVY 0.5 MG/0.5ML ~~LOC~~ SOAJ
0.5000 mL | SUBCUTANEOUS | 0 refills | Status: AC
  Filled 2024-12-29: qty 2, 28d supply, fill #0

## 2024-12-30 ENCOUNTER — Other Ambulatory Visit (HOSPITAL_COMMUNITY): Payer: Self-pay

## 2024-12-31 ENCOUNTER — Other Ambulatory Visit (HOSPITAL_COMMUNITY): Payer: Self-pay
